# Patient Record
Sex: Male | Born: 1955 | Race: White | Hispanic: No | Marital: Married | Smoking: Former smoker
Health system: Southern US, Community
[De-identification: ages and names within clinical notes are randomized; demographics above are authoritative.]

## PROBLEM LIST (undated history)

## (undated) DIAGNOSIS — Z9981 Dependence on supplemental oxygen: Secondary | ICD-10-CM

## (undated) DIAGNOSIS — J449 Chronic obstructive pulmonary disease, unspecified: Secondary | ICD-10-CM

## (undated) DIAGNOSIS — I420 Dilated cardiomyopathy: Secondary | ICD-10-CM

## (undated) DIAGNOSIS — E785 Hyperlipidemia, unspecified: Secondary | ICD-10-CM

## (undated) DIAGNOSIS — Z972 Presence of dental prosthetic device (complete) (partial): Secondary | ICD-10-CM

## (undated) DIAGNOSIS — I509 Heart failure, unspecified: Secondary | ICD-10-CM

## (undated) DIAGNOSIS — I25118 Atherosclerotic heart disease of native coronary artery with other forms of angina pectoris: Secondary | ICD-10-CM

## (undated) DIAGNOSIS — I4891 Unspecified atrial fibrillation: Secondary | ICD-10-CM

## (undated) DIAGNOSIS — I2699 Other pulmonary embolism without acute cor pulmonale: Secondary | ICD-10-CM

## (undated) DIAGNOSIS — E039 Hypothyroidism, unspecified: Secondary | ICD-10-CM

## (undated) DIAGNOSIS — C329 Malignant neoplasm of larynx, unspecified: Secondary | ICD-10-CM

## (undated) DIAGNOSIS — I10 Essential (primary) hypertension: Secondary | ICD-10-CM

## (undated) DIAGNOSIS — J9611 Chronic respiratory failure with hypoxia: Secondary | ICD-10-CM

## (undated) HISTORY — DX: Malignant neoplasm of larynx, unspecified: C32.9

## (undated) HISTORY — PX: APPENDECTOMY: SHX54

## (undated) HISTORY — PX: ANAL FISSURE REPAIR: SHX2312

---

## 2005-04-30 ENCOUNTER — Other Ambulatory Visit: Payer: Self-pay

## 2005-04-30 ENCOUNTER — Inpatient Hospital Stay: Payer: Self-pay | Admitting: Internal Medicine

## 2013-04-29 HISTORY — PX: BIOPSY PHARYNX: SUR141

## 2013-06-27 ENCOUNTER — Emergency Department: Payer: Self-pay | Admitting: Emergency Medicine

## 2013-06-27 LAB — COMPREHENSIVE METABOLIC PANEL
ALT: 25 U/L (ref 12–78)
AST: 40 U/L — AB (ref 15–37)
Albumin: 3.3 g/dL — ABNORMAL LOW (ref 3.4–5.0)
Alkaline Phosphatase: 68 U/L
Anion Gap: 8 (ref 7–16)
BILIRUBIN TOTAL: 0.3 mg/dL (ref 0.2–1.0)
BUN: 11 mg/dL (ref 7–18)
CHLORIDE: 106 mmol/L (ref 98–107)
CO2: 25 mmol/L (ref 21–32)
Calcium, Total: 8.3 mg/dL — ABNORMAL LOW (ref 8.5–10.1)
Creatinine: 0.72 mg/dL (ref 0.60–1.30)
GLUCOSE: 93 mg/dL (ref 65–99)
Osmolality: 277 (ref 275–301)
POTASSIUM: 3.8 mmol/L (ref 3.5–5.1)
Sodium: 139 mmol/L (ref 136–145)
TOTAL PROTEIN: 7 g/dL (ref 6.4–8.2)

## 2013-06-27 LAB — CBC
HCT: 47.3 % (ref 40.0–52.0)
HGB: 15.2 g/dL (ref 13.0–18.0)
MCH: 29 pg (ref 26.0–34.0)
MCHC: 32.2 g/dL (ref 32.0–36.0)
MCV: 90 fL (ref 80–100)
PLATELETS: 107 10*3/uL — AB (ref 150–440)
RBC: 5.26 10*6/uL (ref 4.40–5.90)
RDW: 13.9 % (ref 11.5–14.5)
WBC: 7.2 10*3/uL (ref 3.8–10.6)

## 2013-06-27 LAB — TROPONIN I

## 2013-06-27 LAB — PRO B NATRIURETIC PEPTIDE: B-Type Natriuretic Peptide: 218 pg/mL — ABNORMAL HIGH (ref 0–125)

## 2013-07-02 LAB — CULTURE, BLOOD (SINGLE)

## 2013-08-06 ENCOUNTER — Ambulatory Visit: Payer: Self-pay | Admitting: Unknown Physician Specialty

## 2013-08-13 ENCOUNTER — Ambulatory Visit: Payer: Self-pay | Admitting: Oncology

## 2013-08-16 LAB — COMPREHENSIVE METABOLIC PANEL
ALK PHOS: 80 U/L
AST: 18 U/L (ref 15–37)
Albumin: 4 g/dL (ref 3.4–5.0)
Anion Gap: 6 — ABNORMAL LOW (ref 7–16)
BUN: 13 mg/dL (ref 7–18)
Bilirubin,Total: 0.4 mg/dL (ref 0.2–1.0)
CALCIUM: 9.8 mg/dL (ref 8.5–10.1)
CREATININE: 0.82 mg/dL (ref 0.60–1.30)
Chloride: 103 mmol/L (ref 98–107)
Co2: 30 mmol/L (ref 21–32)
EGFR (African American): >60
Glucose: 101 mg/dL — ABNORMAL HIGH (ref 65–99)
OSMOLALITY: 278 (ref 275–301)
POTASSIUM: 4.9 mmol/L (ref 3.5–5.1)
SGPT (ALT): 24 U/L (ref 12–78)
SODIUM: 139 mmol/L (ref 136–145)
TOTAL PROTEIN: 7.7 g/dL (ref 6.4–8.2)

## 2013-08-16 LAB — CBC CANCER CENTER
BASOS ABS: 0.1 x10 3/mm (ref 0.0–0.1)
Basophil %: 0.7 %
EOS PCT: 1.6 %
Eosinophil #: 0.1 x10 3/mm (ref 0.0–0.7)
HCT: 48.9 % (ref 40.0–52.0)
HGB: 15.9 g/dL (ref 13.0–18.0)
LYMPHS ABS: 2.6 x10 3/mm (ref 1.0–3.6)
Lymphocyte %: 27.4 %
MCH: 29.1 pg (ref 26.0–34.0)
MCHC: 32.5 g/dL (ref 32.0–36.0)
MCV: 90 fL (ref 80–100)
MONOS PCT: 10.1 %
Monocyte #: 1 x10 3/mm (ref 0.2–1.0)
NEUTROS PCT: 60.2 %
Neutrophil #: 5.7 x10 3/mm (ref 1.4–6.5)
PLATELETS: 196 x10 3/mm (ref 150–440)
RBC: 5.46 10*6/uL (ref 4.40–5.90)
RDW: 13.7 % (ref 11.5–14.5)
WBC: 9.4 x10 3/mm (ref 3.8–10.6)

## 2013-08-18 ENCOUNTER — Ambulatory Visit: Payer: Self-pay | Admitting: Oncology

## 2013-08-27 ENCOUNTER — Ambulatory Visit: Payer: Self-pay | Admitting: Oncology

## 2013-09-07 LAB — COMPREHENSIVE METABOLIC PANEL
ALK PHOS: 75 U/L
ALT: 31 U/L (ref 12–78)
Albumin: 3.6 g/dL (ref 3.4–5.0)
Anion Gap: 8 (ref 7–16)
BILIRUBIN TOTAL: 0.4 mg/dL (ref 0.2–1.0)
BUN: 13 mg/dL (ref 7–18)
CALCIUM: 8.5 mg/dL (ref 8.5–10.1)
CO2: 29 mmol/L (ref 21–32)
CREATININE: 0.74 mg/dL (ref 0.60–1.30)
Chloride: 106 mmol/L (ref 98–107)
EGFR (African American): >60
EGFR (Non-African Amer.): >60
Glucose: 99 mg/dL (ref 65–99)
Osmolality: 285 (ref 275–301)
Potassium: 4 mmol/L (ref 3.5–5.1)
SGOT(AST): 22 U/L (ref 15–37)
Sodium: 143 mmol/L (ref 136–145)
Total Protein: 7.2 g/dL (ref 6.4–8.2)

## 2013-09-07 LAB — CBC CANCER CENTER
BASOS ABS: 0 x10 3/mm (ref 0.0–0.1)
BASOS PCT: 0.6 %
EOS ABS: 0.2 x10 3/mm (ref 0.0–0.7)
EOS PCT: 2.1 %
HCT: 44.2 % (ref 40.0–52.0)
HGB: 14.7 g/dL (ref 13.0–18.0)
Lymphocyte #: 2.6 x10 3/mm (ref 1.0–3.6)
Lymphocyte %: 29.4 %
MCH: 29.3 pg (ref 26.0–34.0)
MCHC: 33.2 g/dL (ref 32.0–36.0)
MCV: 88 fL (ref 80–100)
MONO ABS: 0.9 x10 3/mm (ref 0.2–1.0)
Monocyte %: 10.2 %
NEUTROS ABS: 5 x10 3/mm (ref 1.4–6.5)
NEUTROS PCT: 57.7 %
Platelet: 194 x10 3/mm (ref 150–440)
RBC: 5.02 10*6/uL (ref 4.40–5.90)
RDW: 13.6 % (ref 11.5–14.5)
WBC: 8.7 x10 3/mm (ref 3.8–10.6)

## 2013-09-14 LAB — COMPREHENSIVE METABOLIC PANEL
ALT: 33 U/L (ref 12–78)
Albumin: 3.9 g/dL (ref 3.4–5.0)
Alkaline Phosphatase: 89 U/L
Anion Gap: 5 — ABNORMAL LOW (ref 7–16)
BUN: 12 mg/dL (ref 7–18)
Bilirubin,Total: 0.4 mg/dL (ref 0.2–1.0)
CREATININE: 0.73 mg/dL (ref 0.60–1.30)
Calcium, Total: 8.8 mg/dL (ref 8.5–10.1)
Chloride: 103 mmol/L (ref 98–107)
Co2: 32 mmol/L (ref 21–32)
EGFR (Non-African Amer.): >60
Glucose: 99 mg/dL (ref 65–99)
Osmolality: 279 (ref 275–301)
Potassium: 4.6 mmol/L (ref 3.5–5.1)
SGOT(AST): 18 U/L (ref 15–37)
SODIUM: 140 mmol/L (ref 136–145)
TOTAL PROTEIN: 7.7 g/dL (ref 6.4–8.2)

## 2013-09-14 LAB — CBC CANCER CENTER
Basophil #: 0.1 x10 3/mm (ref 0.0–0.1)
Basophil %: 0.6 %
Eosinophil #: 0.2 x10 3/mm (ref 0.0–0.7)
Eosinophil %: 2.1 %
HCT: 45.6 % (ref 40.0–52.0)
HGB: 15.4 g/dL (ref 13.0–18.0)
Lymphocyte #: 1.8 x10 3/mm (ref 1.0–3.6)
Lymphocyte %: 19 %
MCH: 29.1 pg (ref 26.0–34.0)
MCHC: 33.7 g/dL (ref 32.0–36.0)
MCV: 86 fL (ref 80–100)
Monocyte #: 1.1 x10 3/mm — ABNORMAL HIGH (ref 0.2–1.0)
Monocyte %: 11.6 %
NEUTROS PCT: 66.7 %
Neutrophil #: 6.3 x10 3/mm (ref 1.4–6.5)
Platelet: 193 x10 3/mm (ref 150–440)
RBC: 5.27 10*6/uL (ref 4.40–5.90)
RDW: 13.6 % (ref 11.5–14.5)
WBC: 9.5 x10 3/mm (ref 3.8–10.6)

## 2013-09-14 LAB — MAGNESIUM: Magnesium: 2 mg/dL

## 2013-09-21 LAB — CBC CANCER CENTER
BASOS PCT: 0.7 %
Basophil #: 0.1 x10 3/mm (ref 0.0–0.1)
EOS PCT: 2 %
Eosinophil #: 0.2 x10 3/mm (ref 0.0–0.7)
HCT: 46.5 % (ref 40.0–52.0)
HGB: 15.5 g/dL (ref 13.0–18.0)
LYMPHS ABS: 1.5 x10 3/mm (ref 1.0–3.6)
Lymphocyte %: 15.4 %
MCH: 29.7 pg (ref 26.0–34.0)
MCHC: 33.3 g/dL (ref 32.0–36.0)
MCV: 89 fL (ref 80–100)
Monocyte #: 1.1 x10 3/mm — ABNORMAL HIGH (ref 0.2–1.0)
Monocyte %: 11.3 %
NEUTROS ABS: 7.1 x10 3/mm — AB (ref 1.4–6.5)
Neutrophil %: 70.6 %
Platelet: 201 x10 3/mm (ref 150–440)
RBC: 5.22 10*6/uL (ref 4.40–5.90)
RDW: 13.9 % (ref 11.5–14.5)
WBC: 10 x10 3/mm (ref 3.8–10.6)

## 2013-09-27 ENCOUNTER — Ambulatory Visit: Payer: Self-pay | Admitting: Oncology

## 2013-09-28 LAB — COMPREHENSIVE METABOLIC PANEL
ALBUMIN: 3.9 g/dL (ref 3.4–5.0)
ALK PHOS: 87 U/L
ALT: 33 U/L (ref 12–78)
ANION GAP: 12 (ref 7–16)
BUN: 21 mg/dL — ABNORMAL HIGH (ref 7–18)
Bilirubin,Total: 0.5 mg/dL (ref 0.2–1.0)
CALCIUM: 9.6 mg/dL (ref 8.5–10.1)
CO2: 25 mmol/L (ref 21–32)
Chloride: 102 mmol/L (ref 98–107)
Creatinine: 0.89 mg/dL (ref 0.60–1.30)
EGFR (African American): >60
EGFR (Non-African Amer.): >60
GLUCOSE: 152 mg/dL — AB (ref 65–99)
Osmolality: 283 (ref 275–301)
Potassium: 4.3 mmol/L (ref 3.5–5.1)
SGOT(AST): 17 U/L (ref 15–37)
Sodium: 139 mmol/L (ref 136–145)
Total Protein: 8 g/dL (ref 6.4–8.2)

## 2013-09-28 LAB — CBC CANCER CENTER
BASOS ABS: 0 x10 3/mm (ref 0.0–0.1)
Basophil %: 0.2 %
EOS PCT: 0 %
Eosinophil #: 0 x10 3/mm (ref 0.0–0.7)
HCT: 47.5 % (ref 40.0–52.0)
HGB: 15.7 g/dL (ref 13.0–18.0)
Lymphocyte #: 0.6 x10 3/mm — ABNORMAL LOW (ref 1.0–3.6)
Lymphocyte %: 3.5 %
MCH: 29.1 pg (ref 26.0–34.0)
MCHC: 33.1 g/dL (ref 32.0–36.0)
MCV: 88 fL (ref 80–100)
MONOS PCT: 4.8 %
Monocyte #: 0.8 x10 3/mm (ref 0.2–1.0)
Neutrophil #: 15.7 x10 3/mm — ABNORMAL HIGH (ref 1.4–6.5)
Neutrophil %: 91.5 %
Platelet: 242 x10 3/mm (ref 150–440)
RBC: 5.41 10*6/uL (ref 4.40–5.90)
RDW: 13.5 % (ref 11.5–14.5)
WBC: 17.2 x10 3/mm — AB (ref 3.8–10.6)

## 2013-09-28 LAB — MAGNESIUM: MAGNESIUM: 1.8 mg/dL

## 2013-10-05 LAB — COMPREHENSIVE METABOLIC PANEL
AST: 10 U/L — AB (ref 15–37)
Albumin: 3.3 g/dL — ABNORMAL LOW (ref 3.4–5.0)
Alkaline Phosphatase: 68 U/L
Anion Gap: 6 — ABNORMAL LOW (ref 7–16)
BILIRUBIN TOTAL: 0.2 mg/dL (ref 0.2–1.0)
BUN: 14 mg/dL (ref 7–18)
CHLORIDE: 104 mmol/L (ref 98–107)
Calcium, Total: 8.5 mg/dL (ref 8.5–10.1)
Co2: 31 mmol/L (ref 21–32)
Creatinine: 0.76 mg/dL (ref 0.60–1.30)
EGFR (Non-African Amer.): >60
Glucose: 99 mg/dL (ref 65–99)
Osmolality: 282 (ref 275–301)
Potassium: 3.5 mmol/L (ref 3.5–5.1)
SGPT (ALT): 28 U/L (ref 12–78)
Sodium: 141 mmol/L (ref 136–145)
Total Protein: 6.6 g/dL (ref 6.4–8.2)

## 2013-10-05 LAB — CBC CANCER CENTER
BASOS ABS: 0.1 x10 3/mm (ref 0.0–0.1)
Basophil %: 0.4 %
EOS ABS: 0.3 x10 3/mm (ref 0.0–0.7)
EOS PCT: 1.9 %
HCT: 44.8 % (ref 40.0–52.0)
HGB: 14.6 g/dL (ref 13.0–18.0)
LYMPHS ABS: 1.5 x10 3/mm (ref 1.0–3.6)
Lymphocyte %: 11 %
MCH: 29.1 pg (ref 26.0–34.0)
MCHC: 32.7 g/dL (ref 32.0–36.0)
MCV: 89 fL (ref 80–100)
MONO ABS: 1.4 x10 3/mm — AB (ref 0.2–1.0)
Monocyte %: 9.7 %
NEUTROS ABS: 10.8 x10 3/mm — AB (ref 1.4–6.5)
NEUTROS PCT: 77 %
PLATELETS: 252 x10 3/mm (ref 150–440)
RBC: 5.03 10*6/uL (ref 4.40–5.90)
RDW: 13.9 % (ref 11.5–14.5)
WBC: 14.1 x10 3/mm — AB (ref 3.8–10.6)

## 2013-10-05 LAB — MAGNESIUM: Magnesium: 1.6 mg/dL — ABNORMAL LOW

## 2013-10-12 LAB — CBC CANCER CENTER
BASOS PCT: 0.5 %
Basophil #: 0.1 x10 3/mm (ref 0.0–0.1)
EOS ABS: 0.2 x10 3/mm (ref 0.0–0.7)
Eosinophil %: 1.1 %
HCT: 46.3 % (ref 40.0–52.0)
HGB: 15.3 g/dL (ref 13.0–18.0)
Lymphocyte #: 1.2 x10 3/mm (ref 1.0–3.6)
Lymphocyte %: 9 %
MCH: 29.7 pg (ref 26.0–34.0)
MCHC: 33.1 g/dL (ref 32.0–36.0)
MCV: 90 fL (ref 80–100)
Monocyte #: 1.2 x10 3/mm — ABNORMAL HIGH (ref 0.2–1.0)
Monocyte %: 9 %
NEUTROS PCT: 80.4 %
Neutrophil #: 10.8 x10 3/mm — ABNORMAL HIGH (ref 1.4–6.5)
PLATELETS: 207 x10 3/mm (ref 150–440)
RBC: 5.16 10*6/uL (ref 4.40–5.90)
RDW: 14.1 % (ref 11.5–14.5)
WBC: 13.5 x10 3/mm — ABNORMAL HIGH (ref 3.8–10.6)

## 2013-10-12 LAB — MAGNESIUM: MAGNESIUM: 1.6 mg/dL — AB

## 2013-10-19 LAB — COMPREHENSIVE METABOLIC PANEL
ALBUMIN: 3.5 g/dL (ref 3.4–5.0)
ALK PHOS: 82 U/L
AST: 32 U/L (ref 15–37)
Anion Gap: 7 (ref 7–16)
BUN: 14 mg/dL (ref 7–18)
Bilirubin,Total: 0.3 mg/dL (ref 0.2–1.0)
CALCIUM: 9.4 mg/dL (ref 8.5–10.1)
CREATININE: 0.92 mg/dL (ref 0.60–1.30)
Chloride: 104 mmol/L (ref 98–107)
Co2: 27 mmol/L (ref 21–32)
GLUCOSE: 150 mg/dL — AB (ref 65–99)
Osmolality: 279 (ref 275–301)
Potassium: 4 mmol/L (ref 3.5–5.1)
SGPT (ALT): 40 U/L (ref 12–78)
SODIUM: 138 mmol/L (ref 136–145)
TOTAL PROTEIN: 7.4 g/dL (ref 6.4–8.2)

## 2013-10-19 LAB — CBC CANCER CENTER
BASOS ABS: 0.1 x10 3/mm (ref 0.0–0.1)
Basophil %: 0.8 %
EOS ABS: 0.2 x10 3/mm (ref 0.0–0.7)
Eosinophil %: 3.1 %
HCT: 47.2 % (ref 40.0–52.0)
HGB: 15.5 g/dL (ref 13.0–18.0)
Lymphocyte #: 0.8 x10 3/mm — ABNORMAL LOW (ref 1.0–3.6)
Lymphocyte %: 11.6 %
MCH: 29.4 pg (ref 26.0–34.0)
MCHC: 32.8 g/dL (ref 32.0–36.0)
MCV: 90 fL (ref 80–100)
Monocyte #: 0.6 x10 3/mm (ref 0.2–1.0)
Monocyte %: 9.1 %
Neutrophil #: 5.2 x10 3/mm (ref 1.4–6.5)
Neutrophil %: 75.4 %
Platelet: 151 x10 3/mm (ref 150–440)
RBC: 5.27 10*6/uL (ref 4.40–5.90)
RDW: 14 % (ref 11.5–14.5)
WBC: 7 x10 3/mm (ref 3.8–10.6)

## 2013-10-19 LAB — MAGNESIUM: Magnesium: 1.9 mg/dL

## 2013-10-27 ENCOUNTER — Ambulatory Visit: Payer: Self-pay | Admitting: Oncology

## 2013-11-16 LAB — COMPREHENSIVE METABOLIC PANEL
ALT: 21 U/L (ref 12–78)
AST: 17 U/L (ref 15–37)
Albumin: 3.5 g/dL (ref 3.4–5.0)
Alkaline Phosphatase: 75 U/L
Anion Gap: 9 (ref 7–16)
BUN: 9 mg/dL (ref 7–18)
Bilirubin,Total: 0.3 mg/dL (ref 0.2–1.0)
CHLORIDE: 106 mmol/L (ref 98–107)
CO2: 26 mmol/L (ref 21–32)
CREATININE: 0.8 mg/dL (ref 0.60–1.30)
Calcium, Total: 8.7 mg/dL (ref 8.5–10.1)
EGFR (African American): >60
Glucose: 109 mg/dL — ABNORMAL HIGH (ref 65–99)
OSMOLALITY: 281 (ref 275–301)
POTASSIUM: 3.7 mmol/L (ref 3.5–5.1)
Sodium: 141 mmol/L (ref 136–145)
TOTAL PROTEIN: 7.1 g/dL (ref 6.4–8.2)

## 2013-11-16 LAB — MAGNESIUM: MAGNESIUM: 1.7 mg/dL — AB

## 2013-11-16 LAB — CBC CANCER CENTER
BASOS PCT: 0.7 %
Basophil #: 0 x10 3/mm (ref 0.0–0.1)
EOS PCT: 2.4 %
Eosinophil #: 0.1 x10 3/mm (ref 0.0–0.7)
HCT: 43.1 % (ref 40.0–52.0)
HGB: 14.1 g/dL (ref 13.0–18.0)
Lymphocyte #: 0.9 x10 3/mm — ABNORMAL LOW (ref 1.0–3.6)
Lymphocyte %: 14.1 %
MCH: 28.8 pg (ref 26.0–34.0)
MCHC: 32.6 g/dL (ref 32.0–36.0)
MCV: 88 fL (ref 80–100)
MONO ABS: 0.7 x10 3/mm (ref 0.2–1.0)
Monocyte %: 10.7 %
NEUTROS ABS: 4.6 x10 3/mm (ref 1.4–6.5)
Neutrophil %: 72.1 %
PLATELETS: 191 x10 3/mm (ref 150–440)
RBC: 4.88 10*6/uL (ref 4.40–5.90)
RDW: 13.8 % (ref 11.5–14.5)
WBC: 6.3 x10 3/mm (ref 3.8–10.6)

## 2013-11-27 ENCOUNTER — Ambulatory Visit: Payer: Self-pay | Admitting: Oncology

## 2014-01-04 ENCOUNTER — Ambulatory Visit: Payer: Self-pay | Admitting: Oncology

## 2014-01-06 ENCOUNTER — Ambulatory Visit: Payer: Self-pay | Admitting: Oncology

## 2014-01-06 LAB — COMPREHENSIVE METABOLIC PANEL
Albumin: 3.5 g/dL (ref 3.4–5.0)
Alkaline Phosphatase: 71 U/L
Anion Gap: 7 (ref 7–16)
BUN: 11 mg/dL (ref 7–18)
Bilirubin,Total: 0.4 mg/dL (ref 0.2–1.0)
Calcium, Total: 8.7 mg/dL (ref 8.5–10.1)
Chloride: 103 mmol/L (ref 98–107)
Co2: 29 mmol/L (ref 21–32)
Creatinine: 0.82 mg/dL (ref 0.60–1.30)
EGFR (African American): >60
EGFR (Non-African Amer.): >60
Glucose: 98 mg/dL (ref 65–99)
Osmolality: 277 (ref 275–301)
Potassium: 4.2 mmol/L (ref 3.5–5.1)
SGOT(AST): 29 U/L (ref 15–37)
SGPT (ALT): 24 U/L
Sodium: 139 mmol/L (ref 136–145)
Total Protein: 7.1 g/dL (ref 6.4–8.2)

## 2014-01-06 LAB — CBC CANCER CENTER
Basophil #: 0.1 x10 3/mm (ref 0.0–0.1)
Basophil %: 0.9 %
Eosinophil #: 0.1 x10 3/mm (ref 0.0–0.7)
Eosinophil %: 2.2 %
HCT: 45.9 % (ref 40.0–52.0)
HGB: 15.1 g/dL (ref 13.0–18.0)
Lymphocyte #: 1.1 x10 3/mm (ref 1.0–3.6)
Lymphocyte %: 17.5 %
MCH: 28.7 pg (ref 26.0–34.0)
MCHC: 32.9 g/dL (ref 32.0–36.0)
MCV: 87 fL (ref 80–100)
Monocyte #: 0.7 x10 3/mm (ref 0.2–1.0)
Monocyte %: 11.9 %
Neutrophil #: 4.1 x10 3/mm (ref 1.4–6.5)
Neutrophil %: 67.5 %
Platelet: 197 x10 3/mm (ref 150–440)
RBC: 5.25 10*6/uL (ref 4.40–5.90)
RDW: 13.9 % (ref 11.5–14.5)
WBC: 6.1 x10 3/mm (ref 3.8–10.6)

## 2014-01-06 LAB — TSH: THYROID STIMULATING HORM: 1.26 u[IU]/mL

## 2014-01-06 LAB — T4, FREE: Free Thyroxine: 1.01 ng/dL (ref 0.76–1.46)

## 2014-01-27 ENCOUNTER — Ambulatory Visit: Payer: Self-pay | Admitting: Oncology

## 2014-04-07 ENCOUNTER — Ambulatory Visit: Payer: Self-pay | Admitting: Oncology

## 2014-04-07 LAB — COMPREHENSIVE METABOLIC PANEL
ALK PHOS: 69 U/L
ANION GAP: 8 (ref 7–16)
Albumin: 4 g/dL (ref 3.4–5.0)
BILIRUBIN TOTAL: 0.4 mg/dL (ref 0.2–1.0)
BUN: 17 mg/dL (ref 7–18)
Calcium, Total: 8.4 mg/dL — ABNORMAL LOW (ref 8.5–10.1)
Chloride: 102 mmol/L (ref 98–107)
Co2: 29 mmol/L (ref 21–32)
Creatinine: 1.02 mg/dL (ref 0.60–1.30)
EGFR (African American): >60
GLUCOSE: 105 mg/dL — AB (ref 65–99)
OSMOLALITY: 279 (ref 275–301)
POTASSIUM: 4.3 mmol/L (ref 3.5–5.1)
SGOT(AST): 37 U/L (ref 15–37)
SGPT (ALT): 42 U/L
SODIUM: 139 mmol/L (ref 136–145)
Total Protein: 7.4 g/dL (ref 6.4–8.2)

## 2014-04-07 LAB — CBC CANCER CENTER
BASOS ABS: 0 x10 3/mm (ref 0.0–0.1)
Basophil %: 0.8 %
Eosinophil #: 0.1 x10 3/mm (ref 0.0–0.7)
Eosinophil %: 1.8 %
HCT: 46.7 % (ref 40.0–52.0)
HGB: 15.4 g/dL (ref 13.0–18.0)
Lymphocyte #: 1 x10 3/mm (ref 1.0–3.6)
Lymphocyte %: 17.8 %
MCH: 29 pg (ref 26.0–34.0)
MCHC: 32.9 g/dL (ref 32.0–36.0)
MCV: 88 fL (ref 80–100)
MONO ABS: 0.5 x10 3/mm (ref 0.2–1.0)
Monocyte %: 8.4 %
Neutrophil #: 4.2 x10 3/mm (ref 1.4–6.5)
Neutrophil %: 71.2 %
Platelet: 196 x10 3/mm (ref 150–440)
RBC: 5.3 10*6/uL (ref 4.40–5.90)
RDW: 15.3 % — AB (ref 11.5–14.5)
WBC: 5.9 x10 3/mm (ref 3.8–10.6)

## 2014-04-07 LAB — T4, FREE: Free Thyroxine: 0.24 ng/dL — ABNORMAL LOW (ref 0.76–1.46)

## 2014-04-07 LAB — TSH

## 2014-04-29 ENCOUNTER — Ambulatory Visit: Payer: Self-pay | Admitting: Oncology

## 2014-05-06 ENCOUNTER — Emergency Department: Payer: Self-pay | Admitting: Emergency Medicine

## 2014-05-21 ENCOUNTER — Emergency Department: Payer: Self-pay | Admitting: Internal Medicine

## 2014-05-21 LAB — CBC
HCT: 48.3 % (ref 40.0–52.0)
HGB: 16.1 g/dL (ref 13.0–18.0)
MCH: 30.4 pg (ref 26.0–34.0)
MCHC: 33.3 g/dL (ref 32.0–36.0)
MCV: 91 fL (ref 80–100)
PLATELETS: 192 10*3/uL (ref 150–440)
RBC: 5.29 10*6/uL (ref 4.40–5.90)
RDW: 15 % — ABNORMAL HIGH (ref 11.5–14.5)
WBC: 9.9 10*3/uL (ref 3.8–10.6)

## 2014-05-21 LAB — BASIC METABOLIC PANEL
Anion Gap: 5 — ABNORMAL LOW (ref 7–16)
BUN: 18 mg/dL (ref 7–18)
CHLORIDE: 106 mmol/L (ref 98–107)
Calcium, Total: 8.9 mg/dL (ref 8.5–10.1)
Co2: 25 mmol/L (ref 21–32)
Creatinine: 0.97 mg/dL (ref 0.60–1.30)
EGFR (Non-African Amer.): >60
GLUCOSE: 109 mg/dL — AB (ref 65–99)
OSMOLALITY: 274 (ref 275–301)
Potassium: 4 mmol/L (ref 3.5–5.1)
Sodium: 136 mmol/L (ref 136–145)

## 2014-05-21 LAB — TROPONIN I

## 2014-05-23 LAB — TSH: THYROID STIMULATING HORM: 30.1 u[IU]/mL — AB

## 2014-05-30 ENCOUNTER — Ambulatory Visit: Payer: Self-pay | Admitting: Oncology

## 2014-07-13 ENCOUNTER — Ambulatory Visit
Admit: 2014-07-13 | Disposition: A | Discharge status: Home / Self Care | Payer: Self-pay | Attending: Oncology | Admitting: Oncology

## 2014-07-29 ENCOUNTER — Ambulatory Visit
Admit: 2014-07-29 | Disposition: A | Discharge status: Home / Self Care | Payer: Self-pay | Attending: Oncology | Admitting: Oncology

## 2014-08-20 NOTE — Consult Note (Signed)
Reason for Visit: This 59 year old Male patient presents to the clinic for initial evaluation of  cancer of the supraglottic larynx .   Referred by Dr. Tami Ribas.  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  Pathology Report pathology report reviewed   Imaging Report PET CT scan reviewed   Referral Report clinical notes reviewed   Planned Treatment Regimen concurrent Erbitux with IM RT radiation therapy   HPI   Benjamin Torres is a 59 year old male who presents with a several month history of increasing swelling mostly on the right side. He was seen by ENT and underwent microlaryngoscopy with endoscopic biopsy of a lesion of the epiglottic laryngeal surface. Noted on examination was an ulcerative lesion on the right side of epiglottis. 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 Patient not have a PET CT scan showing hypermetabolic soft tissue lesion in the right hypopharynx consistent with epiglottic lesion. There also was a high right paratracheal lymph node which was also hypermetabolic and suspicious and worrisome for metastatic involvement.patient seen by medical oncology and planned concurrent chemotherapy with Erbitux and radiation therapy has been discussed. Case was presented other week the tumor conference. He is seen today for radiation oncology opinion. He is still having some soreness in the right side of his throat. No weight loss no dysphagia.  Past Hx:    COPD:    Blood Clots: R lung per patient 10-15 years ago   Denies medical history:   Past, Family and Social History:  Past Medical History positive   Respiratory COPD   Past Medical History Comments history of thrombophlebitis   Family History noncontributory   Social History positive   Social History Comments the patient has a  40-pack-year smoking history and social EtOH use history has quit smoking.   Additional Past Medical and Surgical History accompanied by multifamily members today   Allergies:   No Known Allergies:   Review of Systems:  General negative   Performance Status (ECOG) 0   Skin negative   Breast negative   Ophthalmologic negative   ENMT see HPI   Respiratory and Thorax negative   Cardiovascular negative   Gastrointestinal negative   Genitourinary negative   Musculoskeletal negative   Neurological negative   Psychiatric negative   Hematology/Lymphatics negative   Endocrine negative   Allergic/Immunologic negative   Review of Systems   review of systems obtained from nurses notes  Physical Exam:  General/Skin/HEENT:  General normal   Skin normal   Eyes normal   Additional PE well-developed male in NAD. Teeth are in good state of repair no oral mucosal lesions are identified. Indirect mirror examination shows base of tongue vallecula within normal limits. Upper airway is clear. Neck is clear without evidence of sub-digastric cervical or supraclavicular adenopathy. Lungs are clear to A&P cardiac examination shows regular rate and rhythm.   Breasts/Resp/CV/GI/GU:  Respiratory and Thorax normal   Cardiovascular normal   Gastrointestinal normal   Genitourinary normal   MS/Neuro/Psych/Lymph:  Musculoskeletal normal   Neurological normal   Lymphatics normal   Other Results:  Radiology Results: LabUnknown:    22-Apr-15 17:09, PET/CT Scan Larynx Staging  PACS Image   Nuclear Med:  PET/CT Scan Larynx Staging   REASON FOR EXAM:    Staging Workup Larynx CA  COMMENTS:       PROCEDURE: PET - PET/CT STAGING LARYNX  - Aug 18 2013  5:09PM     CLINICAL DATA:  Initial treatment strategy for laryngeal carcinoma.Marland Kitchen    EXAM:  NUCLEAR MEDICINE PET SKULL BASE TO THIGH    TECHNIQUE:  12.8 mCi F-18 FDG was injected intravenously. Full-ring PET imaging  was  performed from the skull base to thigh after the radiotracer. CT  data was obtained and used for attenuation correction and anatomic  localization.  FASTING BLOOD GLUCOSE:  Value: 90 mg/dl    COMPARISON:  CT chest 06/27/2013 05/10/2005.    FINDINGS:  NECK    A 10 x 12 mm soft tissue nodule is seen in the right hypopharynx,  along the superior aspect of the right piriform sinus, with an SUV  max of 14.6 (CT and PET image 79). No hypermetabolic lymph nodes in  the neck.    CHEST    A hypermetabolic high right paratracheal lymph node measures 10 mm  with an SUV max of 5.2 (CT image 93 and PET image 95). No additional  hypermetabolic mediastinal, hilar or axillary lymph nodes. No  hypermetabolic pulmonary nodules.    ABDOMEN/PELVIS    No abnormal hypermetabolism in the liver, adrenal glands, spleen or  pancreas. No hypermetabolic lymph nodes.    CT images show the liver to be grossly unremarkable. Stones are seen  in the gallbladder. Adrenal glands and right kidney are grossly  unremarkable. 12 mm low-attenuation lesion in the left kidney is  difficult to definitively characterize without post-contrast  imaging. There may be a low-attenuation lesion in the upper pole  left kidney as well, less well visualized. Spleen, pancreas, stomach  and bowel are grossly unremarkable. Calcifications are seen in the  prostate. No free fluid.    SKELETON    No focal hypermetabolic activity to suggest skeletal metastasis.     IMPRESSION:  1. Hypermetabolic soft tissue lesion in the right hypopharynx,  consistent with the given history laryngeal carcinoma.  Hypermetabolic high right paratracheal lymph node is worrisome for  regional metastasis.  2. Cholelithiasis.    Electronically Signed    By: Lorin Picket M.D.    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 Plan:   at this time I have recommended going ahead with IM RT radiation therapy. Would treat his primary area of tumor involvement and positive hypermetabolic node up to 1638 cGy. Remainder of neck nodes down to the supraclavicular fossa this will be treated to 5400 cGy using IM RT does painting technique. Risks and benefits of treatment including increasing dysphasia or sore throat alteration of taste alteration of blood counts possible xerostomia and skin reaction all were discussed in detail with the patient and his family. They all seem to comprehend my treatment plan well. I set him up for CT simulation later this week. I would use IM RT treatment planning and delivery to spare his salivary glands as much as possible as well as a spinal cord oral cavity. We'll coordinate his Erbitux therapy with medical oncology.  I would like to take this opportunity for allowing me to participate in the care of your patient..  CC Referral:  cc: Dr. Anda Latina, Dr. Hewitt Blade. Walker   Electronic  Signatures: Armstead Peaks (MD)  (Signed 27-Apr-15 13:48)  Authored: HPI, Diagnosis, Past Hx, PFSH, Allergies, ROS, Physical Exam, Other Results, Relevent Results, Encounter Assessment and Plan, CC Referring Physician   Last Updated: 27-Apr-15 13:48 by Armstead Peaks (MD)

## 2014-10-17 ENCOUNTER — Other Ambulatory Visit: Payer: Self-pay

## 2014-10-17 ENCOUNTER — Ambulatory Visit: Payer: Self-pay | Admitting: Oncology

## 2014-10-26 ENCOUNTER — Telehealth: Payer: Self-pay | Admitting: *Deleted

## 2014-10-26 DIAGNOSIS — C329 Malignant neoplasm of larynx, unspecified: Secondary | ICD-10-CM

## 2014-10-26 DIAGNOSIS — Z923 Personal history of irradiation: Secondary | ICD-10-CM

## 2014-10-26 MED ORDER — LEVOTHYROXINE SODIUM 100 MCG PO TABS: 100.0000 ug | ORAL_TABLET | Freq: Every day | ORAL | Status: DC

## 2014-10-26 NOTE — Telephone Encounter (Signed)
Escribed

## 2014-10-28 ENCOUNTER — Other Ambulatory Visit: Payer: Self-pay | Admitting: *Deleted

## 2014-10-28 DIAGNOSIS — C329 Malignant neoplasm of larynx, unspecified: Secondary | ICD-10-CM

## 2014-11-02 ENCOUNTER — Inpatient Hospital Stay (HOSPITAL_BASED_OUTPATIENT_CLINIC_OR_DEPARTMENT_OTHER): Payer: 59 | Admitting: Oncology

## 2014-11-02 ENCOUNTER — Inpatient Hospital Stay: Payer: 59 | Attending: Oncology

## 2014-11-02 VITALS — BP 121/80 | HR 65 | Temp 97.0°F | Wt 182.5 lb

## 2014-11-02 DIAGNOSIS — Z86711 Personal history of pulmonary embolism: Secondary | ICD-10-CM

## 2014-11-02 DIAGNOSIS — Z923 Personal history of irradiation: Secondary | ICD-10-CM | POA: Insufficient documentation

## 2014-11-02 DIAGNOSIS — Z79899 Other long term (current) drug therapy: Secondary | ICD-10-CM | POA: Insufficient documentation

## 2014-11-02 DIAGNOSIS — Z8521 Personal history of malignant neoplasm of larynx: Secondary | ICD-10-CM

## 2014-11-02 DIAGNOSIS — Z9221 Personal history of antineoplastic chemotherapy: Secondary | ICD-10-CM | POA: Diagnosis not present

## 2014-11-02 DIAGNOSIS — Z87891 Personal history of nicotine dependence: Secondary | ICD-10-CM | POA: Diagnosis not present

## 2014-11-02 DIAGNOSIS — C329 Malignant neoplasm of larynx, unspecified: Secondary | ICD-10-CM

## 2014-11-02 DIAGNOSIS — E039 Hypothyroidism, unspecified: Secondary | ICD-10-CM | POA: Diagnosis not present

## 2014-11-02 DIAGNOSIS — J449 Chronic obstructive pulmonary disease, unspecified: Secondary | ICD-10-CM

## 2014-11-02 DIAGNOSIS — C760 Malignant neoplasm of head, face and neck: Secondary | ICD-10-CM

## 2014-11-02 LAB — CBC WITH DIFFERENTIAL/PLATELET
BASOS PCT: 1 %
Basophils Absolute: 0.1 10*3/uL (ref 0–0.1)
EOS ABS: 0.1 10*3/uL (ref 0–0.7)
Eosinophils Relative: 2 %
HEMATOCRIT: 45.5 % (ref 40.0–52.0)
Hemoglobin: 14.9 g/dL (ref 13.0–18.0)
Lymphocytes Relative: 21 %
Lymphs Abs: 1.4 10*3/uL (ref 1.0–3.6)
MCH: 28.5 pg (ref 26.0–34.0)
MCHC: 32.8 g/dL (ref 32.0–36.0)
MCV: 87.1 fL (ref 80.0–100.0)
MONO ABS: 0.7 10*3/uL (ref 0.2–1.0)
Monocytes Relative: 10 %
Neutro Abs: 4.4 10*3/uL (ref 1.4–6.5)
Neutrophils Relative %: 66 %
Platelets: 193 10*3/uL (ref 150–440)
RBC: 5.23 MIL/uL (ref 4.40–5.90)
RDW: 14.2 % (ref 11.5–14.5)
WBC: 6.7 10*3/uL (ref 3.8–10.6)

## 2014-11-02 LAB — COMPREHENSIVE METABOLIC PANEL
ALBUMIN: 4.5 g/dL (ref 3.5–5.0)
ALK PHOS: 55 U/L (ref 38–126)
ALT: 18 U/L (ref 17–63)
ANION GAP: 8 (ref 5–15)
AST: 21 U/L (ref 15–41)
BUN: 18 mg/dL (ref 6–20)
CO2: 26 mmol/L (ref 22–32)
Calcium: 8.9 mg/dL (ref 8.9–10.3)
Chloride: 103 mmol/L (ref 101–111)
Creatinine, Ser: 0.92 mg/dL (ref 0.61–1.24)
GFR calc Af Amer: >60 mL/min (ref >60)
GFR calc non Af Amer: >60 mL/min (ref >60)
Glucose, Bld: 93 mg/dL (ref 65–99)
Potassium: 4.2 mmol/L (ref 3.5–5.1)
Sodium: 137 mmol/L (ref 135–145)
Total Bilirubin: 0.6 mg/dL (ref 0.3–1.2)
Total Protein: 7.4 g/dL (ref 6.5–8.1)

## 2014-11-02 LAB — TSH: TSH: 7.239 u[IU]/mL — AB (ref 0.350–4.500)

## 2014-11-02 NOTE — Progress Notes (Signed)
Patient does not have living will.  Former smoker. 

## 2014-11-03 LAB — T4: T4, Total: 9 ug/dL (ref 4.5–12.0)

## 2014-11-06 ENCOUNTER — Encounter: Payer: Self-pay | Admitting: Oncology

## 2014-11-06 DIAGNOSIS — C329 Malignant neoplasm of larynx, unspecified: Secondary | ICD-10-CM | POA: Insufficient documentation

## 2014-11-06 HISTORY — DX: Malignant neoplasm of larynx, unspecified: C32.9

## 2014-11-06 NOTE — Progress Notes (Signed)
[place] @ [facility] Telephone:(336) [phone]  Fax:(336) [place]: [birth year]  MR#: [id number]  ZJQ#:[id number]  No care team member to display  CHIEF COMPLAINT:  Chief Complaint  Patient presents with  . Follow-up    Oncology History   1.carcinoma of larynx involving epiglottis  Biopsy is positive for squamous cell carcinoma T1, N1, M0 (PET scan shows a small parapharyngeal lymph node make hypermetabolism possibility of metastatic  could not be ruled out) 2.started on radiation therapy and cetuximab (Sep 07, 2013) 3.patient has finished radiation and cetuximab therapy in July of 2015     Cancer of larynx    No flowsheet data found.  INTERVAL HISTORY: Extremely-year-old gentleman with carcinoma of larynx came today further follow-up.  Since last evaluation patient did not have any major complaint.  No soreness in the mouth.  No difficulty swallowing.  Patient has stopped smoking.  REVIEW OF SYSTEMS:   GENERAL:  Feels good.  Active.  No fevers, sweats or weight loss. PERFORMANCE STATUS (ECOG): 0 HEENT:  No visual changes, runny nose, sore throat, mouth sores or tenderness. Lungs: No shortness of breath or cough.  No hemoptysis. Cardiac:  No chest pain, palpitations, orthopnea, or PND. GI:  No nausea, vomiting, diarrhea, constipation, melena or hematochezia. GU:  No urgency, frequency, dysuria, or hematuria. Musculoskeletal:  No back pain.  No joint pain.  No muscle tenderness. Extremities:  No pain or swelling. Skin:  No rashes or skin changes. Neuro:  No headache, numbness or weakness, balance or coordination issues. Endocrine:  No diabetes, thyroid issues, hot flashes or night sweats. Psych:  No mood changes, depression or anxiety. Pain:  No focal pain. Review of systems:  All other systems reviewed and found to be negative. As per HPI. Otherwise, a complete review of systems is negatve.  PAST MEDICAL HISTORY: Past Medical History  Diagnosis  Date  . Cancer of larynx 11/06/2014    Allergies:  No Known Allergies:   Significant History/PMH:   COPD:    Blood Clots: R lung per patient 10-15 years ago   Denies medical history:   Preventive Screening:  Has patient had any of the following test? Prostate Exam   Last Prostate Exam: 15 years ago   Smoking History: Smoking History 1 Packs per day and 40 year use, quit 76months ago  [place]: Family History: noncontributory  Social History: positive alcohol, positive tobacco  Smoker: Smoking cessation counseling performed  Smoker- How Many Years: 42 YEARS  Additional Past Medical and Surgical History: history of pulmonary embolism 10 years ago  staph infection of the finger   ADVANCED DIRECTIVES:  Patient does not have any living will or healthcare power of attorney.  Information was given .  Available resources had been discussed.  We will follow-up on subsequent appointments regarding this issue HEALTH MAINTENANCE: History  Substance Use Topics  . Smoking status: Former Research scientist (life sciences)  . Smokeless tobacco: Not on file  . Alcohol Use: Not on file      No Known Allergies  Current Outpatient Prescriptions  Medication Sig Dispense Refill  . levothyroxine (SYNTHROID) 100 MCG tablet Take 1 tablet (100 mcg total) by mouth daily before breakfast. 30 tablet 0  . lisinopril (PRINIVIL,ZESTRIL) 10 MG tablet Take 10 mg by mouth daily.  5  . omeprazole (PRILOSEC) 20 MG capsule Take 20 mg by mouth daily.     No current facility-administered medications for this visit.    OBJECTIVE:  Filed Vitals:  11/02/14 1547  BP: 121/80  Pulse: 65  Temp: 97 F (36.1 C)     There is no height on file to calculate BMI.    ECOG FS:0 - Asymptomatic  PHYSICAL EXAM: GENERAL:  Well developed, well nourished, sitting comfortably in the exam room in no acute distress. MENTAL STATUS:  Alert and oriented to person, place and time. HEAD: Normocephalic, atraumatic, face symmetric, no Cushingoid  features. EYES.  Pupils equal round and reactive to light and accomodation.  No conjunctivitis or scleral icterus. ENT:  Oropharynx clear without lesion.  Tongue normal. Mucous membranes moist.  RESPIRATORY:  Clear to auscultation without rales, wheezes or rhonchi. CARDIOVASCULAR:  Regular rate and rhythm without murmur, rub or gallop.  ABDOMEN:  Soft, non-tender, with active bowel sounds, and no hepatosplenomegaly.  No masses. BACK:  No CVA tenderness.  No tenderness on percussion of the back or rib cage. SKIN:  No rashes, ulcers or lesions. EXTREMITIES: No edema, no skin discoloration or tenderness.  No palpable cords. LYMPH NODES: No palpable cervical, supraclavicular, axillary or inguinal adenopathy  NEUROLOGICAL: Unremarkable. PSYCH:  Appropriate.   LAB RESULTS:  Appointment on 11/02/2014  Component Date Value Ref Range Status  . WBC 11/02/2014 6.7  3.8 - 10.6 K/uL Final  . RBC 11/02/2014 5.23  4.40 - 5.90 MIL/uL Final  . Hemoglobin 11/02/2014 14.9  13.0 - 18.0 g/dL Final  . HCT 11/02/2014 45.5  40.0 - 52.0 % Final  . MCV 11/02/2014 87.1  80.0 - 100.0 fL Final  . MCH 11/02/2014 28.5  26.0 - 34.0 pg Final  . MCHC 11/02/2014 32.8  32.0 - 36.0 g/dL Final  . RDW 11/02/2014 14.2  11.5 - 14.5 % Final  . Platelets 11/02/2014 193  150 - 440 K/uL Final  . Neutrophils Relative % 11/02/2014 66   Final  . Neutro Abs 11/02/2014 4.4  1.4 - 6.5 K/uL Final  . Lymphocytes Relative 11/02/2014 21   Final  . Lymphs Abs 11/02/2014 1.4  1.0 - 3.6 K/uL Final  . Monocytes Relative 11/02/2014 10   Final  . Monocytes Absolute 11/02/2014 0.7  0.2 - 1.0 K/uL Final  . Eosinophils Relative 11/02/2014 2   Final  . Eosinophils Absolute 11/02/2014 0.1  0 - 0.7 K/uL Final  . Basophils Relative 11/02/2014 1   Final  . Basophils Absolute 11/02/2014 0.1  0 - 0.1 K/uL Final  . Sodium 11/02/2014 137  135 - 145 mmol/L Final  . Potassium 11/02/2014 4.2  3.5 - 5.1 mmol/L Final  . Chloride 11/02/2014 103  101 -  111 mmol/L Final  . CO2 11/02/2014 26  22 - 32 mmol/L Final  . Glucose, Bld 11/02/2014 93  65 - 99 mg/dL Final  . BUN 11/02/2014 18  6 - 20 mg/dL Final  . Creatinine, Ser 11/02/2014 0.92  0.61 - 1.24 mg/dL Final  . Calcium 11/02/2014 8.9  8.9 - 10.3 mg/dL Final  . Total Protein 11/02/2014 7.4  6.5 - 8.1 g/dL Final  . Albumin 11/02/2014 4.5  3.5 - 5.0 g/dL Final  . AST 11/02/2014 21  15 - 41 U/L Final  . ALT 11/02/2014 18  17 - 63 U/L Final  . Alkaline Phosphatase 11/02/2014 55  38 - 126 U/L Final  . Total Bilirubin 11/02/2014 0.6  0.3 - 1.2 mg/dL Final  . GFR calc non Af Amer 11/02/2014 >60  >60 mL/min Final  . GFR calc Af Amer 11/02/2014 >60  >60 mL/min Final   Comment: (NOTE) The  eGFR has been calculated using the CKD EPI equation. This calculation has not been validated in all clinical situations. eGFR's persistently <60 mL/min signify possible Chronic Kidney Disease.   . Anion gap 11/02/2014 8  5 - 15 Final  . T4, Total 11/02/2014 9.0  4.5 - 12.0 ug/dL Final   Comment: (NOTE) Performed At: [facility] [place], [place] [id number] Lindon Romp MD WU:[id number]   . TSH 11/02/2014 7.239* 0.350 - 4.500 uIU/mL Final       ASSESSMENT: Cancer of larynx at present time there is no evidence of recurrent disease Hypothyroidism TSH is slightly elevated patient is on 100 g of Synthroid will continue same dose  MEDICAL DECISION MAKING:  All lab data has been reviewed.  Repeat PET scan in 6 month  Patient expressed understanding and was in agreement with this plan. He also understands that He can call clinic at any time with any questions, concerns, or complaints.    Cancer of larynx   Staging form: Larynx - Supraglottis, AJCC 7th Edition     Clinical: No stage assigned - Marni Griffon, MD   11/06/2014 8:28 AM

## 2014-11-07 ENCOUNTER — Telehealth: Payer: Self-pay | Admitting: *Deleted

## 2014-11-07 DIAGNOSIS — C159 Malignant neoplasm of esophagus, unspecified: Secondary | ICD-10-CM

## 2014-11-07 DIAGNOSIS — C329 Malignant neoplasm of larynx, unspecified: Secondary | ICD-10-CM

## 2014-11-07 MED ORDER — LEVOTHYROXINE SODIUM 112 MCG PO TABS: 112.0000 ug | ORAL_TABLET | Freq: Every day | ORAL | Status: DC

## 2014-11-07 NOTE — Telephone Encounter (Signed)
Elevated TSH, need to increase synthroid to 153mcg. New prescription called into pharmacy at [facility]. Needs to start taking and will recheck labs in 6-8 weeks. Schedulers will call with lab appt date and time.

## 2014-11-07 NOTE — Telephone Encounter (Signed)
Called patient and left message that TSH is elevated. MD wants him to increase his synthroid to 112 mcg.  A new prescription has been called to [facility].  MD would like to recheck levels in 6-8 weeks.  A scheduler will be in touch with patient regarding that appt.

## 2014-12-26 ENCOUNTER — Inpatient Hospital Stay: Payer: 59 | Attending: Oncology

## 2015-03-27 ENCOUNTER — Telehealth: Payer: Self-pay | Admitting: *Deleted

## 2015-03-27 DIAGNOSIS — C760 Malignant neoplasm of head, face and neck: Secondary | ICD-10-CM

## 2015-03-27 MED ORDER — LISINOPRIL 10 MG PO TABS: 10.0000 mg | ORAL_TABLET | Freq: Every day | ORAL | Status: DC

## 2015-03-27 NOTE — Telephone Encounter (Signed)
Escribed

## 2015-05-04 ENCOUNTER — Ambulatory Visit: Payer: 59 | Attending: Oncology

## 2015-05-11 ENCOUNTER — Inpatient Hospital Stay: Payer: BLUE CROSS/BLUE SHIELD

## 2015-05-11 ENCOUNTER — Inpatient Hospital Stay: Payer: BLUE CROSS/BLUE SHIELD | Admitting: Oncology

## 2015-05-30 ENCOUNTER — Inpatient Hospital Stay: Payer: BLUE CROSS/BLUE SHIELD | Attending: Oncology | Admitting: Oncology

## 2015-05-30 ENCOUNTER — Encounter: Payer: Self-pay | Admitting: Oncology

## 2015-05-30 ENCOUNTER — Inpatient Hospital Stay: Payer: BLUE CROSS/BLUE SHIELD

## 2015-05-30 VITALS — BP 139/90 | HR 76 | Temp 97.5°F | Resp 18 | Wt 184.0 lb

## 2015-05-30 DIAGNOSIS — Z923 Personal history of irradiation: Secondary | ICD-10-CM | POA: Diagnosis not present

## 2015-05-30 DIAGNOSIS — Z8521 Personal history of malignant neoplasm of larynx: Secondary | ICD-10-CM | POA: Diagnosis not present

## 2015-05-30 DIAGNOSIS — Z86711 Personal history of pulmonary embolism: Secondary | ICD-10-CM | POA: Insufficient documentation

## 2015-05-30 DIAGNOSIS — E039 Hypothyroidism, unspecified: Secondary | ICD-10-CM | POA: Insufficient documentation

## 2015-05-30 DIAGNOSIS — Z87891 Personal history of nicotine dependence: Secondary | ICD-10-CM | POA: Insufficient documentation

## 2015-05-30 DIAGNOSIS — Z9221 Personal history of antineoplastic chemotherapy: Secondary | ICD-10-CM | POA: Insufficient documentation

## 2015-05-30 DIAGNOSIS — J449 Chronic obstructive pulmonary disease, unspecified: Secondary | ICD-10-CM | POA: Insufficient documentation

## 2015-05-30 DIAGNOSIS — Z79899 Other long term (current) drug therapy: Secondary | ICD-10-CM | POA: Diagnosis not present

## 2015-05-30 DIAGNOSIS — C329 Malignant neoplasm of larynx, unspecified: Secondary | ICD-10-CM

## 2015-05-30 DIAGNOSIS — C760 Malignant neoplasm of head, face and neck: Secondary | ICD-10-CM

## 2015-05-30 LAB — COMPREHENSIVE METABOLIC PANEL
ALBUMIN: 4.5 g/dL (ref 3.5–5.0)
ALK PHOS: 52 U/L (ref 38–126)
ALT: 17 U/L (ref 17–63)
AST: 22 U/L (ref 15–41)
Anion gap: 5 (ref 5–15)
BUN: 12 mg/dL (ref 6–20)
CO2: 26 mmol/L (ref 22–32)
Calcium: 9.1 mg/dL (ref 8.9–10.3)
Chloride: 103 mmol/L (ref 101–111)
Creatinine, Ser: 0.82 mg/dL (ref 0.61–1.24)
GFR calc Af Amer: >60 mL/min (ref >60)
GFR calc non Af Amer: >60 mL/min (ref >60)
Glucose, Bld: 96 mg/dL (ref 65–99)
POTASSIUM: 4.3 mmol/L (ref 3.5–5.1)
Sodium: 134 mmol/L — ABNORMAL LOW (ref 135–145)
Total Bilirubin: 0.6 mg/dL (ref 0.3–1.2)
Total Protein: 7.6 g/dL (ref 6.5–8.1)

## 2015-05-30 LAB — CBC WITH DIFFERENTIAL/PLATELET
BASOS PCT: 1 %
Basophils Absolute: 0 10*3/uL (ref 0–0.1)
Eosinophils Absolute: 0.1 10*3/uL (ref 0–0.7)
Eosinophils Relative: 2 %
HEMATOCRIT: 44.3 % (ref 40.0–52.0)
HEMOGLOBIN: 14.8 g/dL (ref 13.0–18.0)
Lymphocytes Relative: 17 %
Lymphs Abs: 1.4 10*3/uL (ref 1.0–3.6)
MCH: 28.7 pg (ref 26.0–34.0)
MCHC: 33.3 g/dL (ref 32.0–36.0)
MCV: 86 fL (ref 80.0–100.0)
Monocytes Absolute: 0.8 10*3/uL (ref 0.2–1.0)
Monocytes Relative: 10 %
NEUTROS ABS: 5.8 10*3/uL (ref 1.4–6.5)
NEUTROS PCT: 70 %
Platelets: 222 10*3/uL (ref 150–440)
RBC: 5.15 MIL/uL (ref 4.40–5.90)
RDW: 13.9 % (ref 11.5–14.5)
WBC: 8.1 10*3/uL (ref 3.8–10.6)

## 2015-05-30 LAB — TSH: TSH: 2.567 u[IU]/mL (ref 0.350–4.500)

## 2015-05-30 MED ORDER — AZITHROMYCIN 500 MG PO TABS: 500.0000 mg | ORAL_TABLET | Freq: Every day | ORAL | Status: DC

## 2015-05-30 NOTE — Progress Notes (Signed)
[place] @ [facility] Telephone:(336) [phone]  Fax:(336) [place]: 08-Jul-1955  MR#: [id number]  MCN#:[id number]  No care team member to display  CHIEF COMPLAINT:  Chief Complaint  Patient presents with  . Head and neck cancer    Oncology History   1.carcinoma of larynx involving epiglottis  Biopsy is positive for squamous cell carcinoma T1, N1, M0 (PET scan shows a small parapharyngeal lymph node make hypermetabolism possibility of metastatic  could not be ruled out) 2.started on radiation therapy and cetuximab (Sep 07, 2013) 3.patient has finished radiation and cetuximab therapy in July of 2015     Cancer of larynx [facility])    No flowsheet data found.  INTERVAL HISTORY: Extremely-year-old gentleman with carcinoma of larynx came today further follow-up.  Since last evaluation patient did not have any major complaint.  No soreness in the mouth.  No difficulty swallowing.  Patient has stopped smoking. Patient  complaints of sore throat Did not have any HEENT checkup in last few months  REVIEW OF SYSTEMS:   GENERAL:  Feels good.  Active.  No fevers, sweats or weight loss. PERFORMANCE STATUS (ECOG): 0 HEENT:  No visual changes, runny nose, sore throat, mouth sores or tenderness. Lungs: No shortness of breath or cough.  No hemoptysis. Cardiac:  No chest pain, palpitations, orthopnea, or PND. GI:  No nausea, vomiting, diarrhea, constipation, melena or hematochezia. GU:  No urgency, frequency, dysuria, or hematuria. Musculoskeletal:  No back pain.  No joint pain.  No muscle tenderness. Extremities:  No pain or swelling. Skin:  No rashes or skin changes. Neuro:  No headache, numbness or weakness, balance or coordination issues. Endocrine:  No diabetes, thyroid issues, hot flashes or night sweats. Psych:  No mood changes, depression or anxiety. Pain:  No focal pain. Review of systems:  All other systems reviewed and found to be negative. As per HPI. Otherwise,  a complete review of systems is negatve.  PAST MEDICAL HISTORY: Past Medical History  Diagnosis Date  . Cancer of larynx ([place]) 11/06/2014    Allergies:  No Known Allergies:   Significant History/PMH:   COPD:    Blood Clots: R lung per patient 10-15 years ago   Denies medical history:   Preventive Screening:  Has patient had any of the following test? Prostate Exam   Last Prostate Exam: 15 years ago   Smoking History: Smoking History 1 Packs per day and 40 year use, quit 41month ago  [place]: noncontributory  Social History: positive alcohol, positive tobacco  Smoker: Smoking cessation counseling performed  Smoker- How Many Years: 436YEARS  Additional Past Medical and Surgical History: history of pulmonary embolism 10 years ago  staph infection of the finger   ADVANCED DIRECTIVES:  Patient does not have any living will or healthcare power of attorney.  Information was given .  Available resources had been discussed.  We will follow-up on subsequent appointments regarding this issue HEALTH MAINTENANCE: Social History  Substance Use Topics  . Smoking status: Former SResearch scientist (life sciences) . Smokeless tobacco: None  . Alcohol Use: None      No Known Allergies  Current Outpatient Prescriptions  Medication Sig Dispense Refill  . levothyroxine (SYNTHROID) 112 MCG tablet Take 1 tablet (112 mcg total) by mouth daily before breakfast. 30 tablet 6  . lisinopril (PRINIVIL,ZESTRIL) 10 MG tablet Take 1 tablet (10 mg total) by mouth daily. 30 tablet 2  . omeprazole (PRILOSEC) 20 MG capsule Take 20 mg  by mouth daily.     No current facility-administered medications for this visit.    OBJECTIVE:  Filed Vitals:   05/30/15 1413  BP: 139/90  Pulse: 76  Temp: 97.5 F (36.4 C)  Resp: 18     There is no height on file to calculate BMI.    ECOG FS:0 - Asymptomatic  PHYSICAL EXAM: GENERAL:  Well developed, well nourished, sitting comfortably in the exam room in no acute  distress. MENTAL STATUS:  Alert and oriented to person, place and time. HEAD: Normocephalic, atraumatic, face symmetric, no Cushingoid features. EYES.  Pupils equal round and reactive to light and accomodation.  No conjunctivitis or scleral icterus. ENT:  Oropharynx clear without lesion.  Tongue normal. Mucous membranes moist.  RESPIRATORY:  Clear to auscultation without rales, wheezes or rhonchi. CARDIOVASCULAR:  Regular rate and rhythm without murmur, rub or gallop.  ABDOMEN:  Soft, non-tender, with active bowel sounds, and no hepatosplenomegaly.  No masses. BACK:  No CVA tenderness.  No tenderness on percussion of the back or rib cage. SKIN:  No rashes, ulcers or lesions. EXTREMITIES: No edema, no skin discoloration or tenderness.  No palpable cords. LYMPH NODES: No palpable cervical, supraclavicular, axillary or inguinal adenopathy  NEUROLOGICAL: Unremarkable. PSYCH:  Appropriate.   LAB RESULTS:  Appointment on 05/30/2015  Component Date Value Ref Range Status  . WBC 05/30/2015 8.1  3.8 - 10.6 K/uL Final  . RBC 05/30/2015 5.15  4.40 - 5.90 MIL/uL Final  . Hemoglobin 05/30/2015 14.8  13.0 - 18.0 g/dL Final  . HCT 05/30/2015 44.3  40.0 - 52.0 % Final  . MCV 05/30/2015 86.0  80.0 - 100.0 fL Final  . MCH 05/30/2015 28.7  26.0 - 34.0 pg Final  . MCHC 05/30/2015 33.3  32.0 - 36.0 g/dL Final  . RDW 05/30/2015 13.9  11.5 - 14.5 % Final  . Platelets 05/30/2015 222  150 - 440 K/uL Final  . Neutrophils Relative % 05/30/2015 70   Final  . Neutro Abs 05/30/2015 5.8  1.4 - 6.5 K/uL Final  . Lymphocytes Relative 05/30/2015 17   Final  . Lymphs Abs 05/30/2015 1.4  1.0 - 3.6 K/uL Final  . Monocytes Relative 05/30/2015 10   Final  . Monocytes Absolute 05/30/2015 0.8  0.2 - 1.0 K/uL Final  . Eosinophils Relative 05/30/2015 2   Final  . Eosinophils Absolute 05/30/2015 0.1  0 - 0.7 K/uL Final  . Basophils Relative 05/30/2015 1   Final  . Basophils Absolute 05/30/2015 0.0  0 - 0.1 K/uL Final  .  Sodium 05/30/2015 134* 135 - 145 mmol/L Final  . Potassium 05/30/2015 4.3  3.5 - 5.1 mmol/L Final  . Chloride 05/30/2015 103  101 - 111 mmol/L Final  . CO2 05/30/2015 26  22 - 32 mmol/L Final  . Glucose, Bld 05/30/2015 96  65 - 99 mg/dL Final  . BUN 05/30/2015 12  6 - 20 mg/dL Final  . Creatinine, Ser 05/30/2015 0.82  0.61 - 1.24 mg/dL Final  . Calcium 05/30/2015 9.1  8.9 - 10.3 mg/dL Final  . Total Protein 05/30/2015 7.6  6.5 - 8.1 g/dL Final  . Albumin 05/30/2015 4.5  3.5 - 5.0 g/dL Final  . AST 05/30/2015 22  15 - 41 U/L Final  . ALT 05/30/2015 17  17 - 63 U/L Final  . Alkaline Phosphatase 05/30/2015 52  38 - 126 U/L Final  . Total Bilirubin 05/30/2015 0.6  0.3 - 1.2 mg/dL Final  . GFR calc non  Af Amer 05/30/2015 >60  >60 mL/min Final  . GFR calc Af Amer 05/30/2015 >60  >60 mL/min Final   Comment: (NOTE) The eGFR has been calculated using the CKD EPI equation. This calculation has not been validated in all clinical situations. eGFR's persistently <60 mL/min signify possible Chronic Kidney Disease.   . Anion gap 05/30/2015 5  5 - 15 Final       ASSESSMENT: Cancer of larynx at present time there is no evidence of recurrent disease Hypothyroidism TSH is slightly elevated patient is on 100 g of Synthroid will continue same dose Neck T4 and TSH and adjusted dose if needed a patient was given now to throw max for 3 days Appointment with ENT physician has been made A repeat PET scan has been scheduled if this is normal patient does not need any further PET scanning and can be clinically followed    MEDICAL DECISION MAKING: Lab data has been reviewed  Patient expressed understanding and was in agreement with this plan. He also understands that He can call clinic at any time with any questions, concerns, or complaints.    Cancer of larynx   Staging form: Larynx - Supraglottis, AJCC 7th Edition     Clinical: No stage assigned - Marni Griffon, MD   05/30/2015 3:04  PM

## 2015-05-31 LAB — T4: T4 TOTAL: 10.2 ug/dL (ref 4.5–12.0)

## 2015-06-20 ENCOUNTER — Ambulatory Visit
Admission: RE | Admit: 2015-06-20 | Discharge: 2015-06-20 | Disposition: A | Discharge status: Home / Self Care | Payer: BLUE CROSS/BLUE SHIELD | Source: Ambulatory Visit | Attending: Oncology | Admitting: Oncology

## 2015-06-20 DIAGNOSIS — C329 Malignant neoplasm of larynx, unspecified: Secondary | ICD-10-CM

## 2015-06-20 LAB — GLUCOSE, CAPILLARY: GLUCOSE-CAPILLARY: 111 mg/dL — AB (ref 65–99)

## 2015-06-20 MED ORDER — FLUDEOXYGLUCOSE F - 18 (FDG) INJECTION
12.3700 | Freq: Once | INTRAVENOUS | Status: AC | PRN
  Administered 2015-06-20: 12.37 via INTRAVENOUS

## 2015-07-30 ENCOUNTER — Other Ambulatory Visit: Payer: Self-pay | Admitting: Oncology

## 2015-08-14 ENCOUNTER — Ambulatory Visit: Payer: BLUE CROSS/BLUE SHIELD | Attending: Unknown Physician Specialty | Admitting: Speech Pathology

## 2015-08-14 DIAGNOSIS — R490 Dysphonia: Secondary | ICD-10-CM | POA: Insufficient documentation

## 2015-08-15 ENCOUNTER — Encounter: Payer: Self-pay | Admitting: Speech Pathology

## 2015-08-15 NOTE — Therapy (Signed)
[place] MAIN [facility] SERVICES [address] [place], [place], [address] Phone: [phone]   Fax:  [phone]  Speech Language Pathology Evaluation  Patient Details  Name: Benjamin Torres MRN: [id number] Date of Birth: [birth year] Referring Provider: Beverly Gust, MD  Encounter Date: 08/14/2015      End of Session - 08/15/15 1245    Visit Number 1   Number of Visits 17   Date for SLP Re-Evaluation 10/06/15   SLP Start Time 0850   SLP Stop Time  0935   SLP Time Calculation (min) 45 min   Activity Tolerance Patient tolerated treatment well      Past Medical History  Diagnosis Date  . 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 No pertinent past surgical history.  There were no vitals filed for this visit.      Subjective Assessment - 08/15/15 1244    Subjective This 60 year-old male is presenting with severe dysphonia characterized by excessive laryngeal muscle tension, strained/strangled vocal quality, asthenia, and nearly aphonic vocal quality. His ENT physician, Dr. Beverly Torres, noted that he is using his false vocal folds for speech, but does have a normal cough.   Currently in Pain? No/denies            SLP Evaluation [facility] - 08/15/15 0001    SLP Visit Information   SLP Received On 08/14/15   Referring Provider Benjamin Gust, MD   Onset Date 06/16/2015   Medical Diagnosis Dysphonia   Subjective   Subjective This 60 year-old male is presenting with severe dysphonia characterized by excessive laryngeal muscle tension, strained/strangled vocal quality, asthenia, and nearly aphonic vocal quality. His ENT physician, Dr. Beverly Torres, noted that he is using his false vocal folds for speech, but does have a normal cough.   Patient/Family Stated Goal To be able to be heard   Prior Functional Status   Cognitive/Linguistic Baseline Within functional limits   Oral Motor/Sensory Function   Overall Oral  Motor/Sensory Function Appears within functional limits for tasks assessed   Motor Speech   Overall Motor Speech Appears within functional limits for tasks assessed   Phonation Aphonic;Low vocal intensity;Hoarse   Standardized Assessments   Standardized Assessments  Other Assessment  Perceptual Voice Evaluation           Perceptual Voice Evaluation Average time patient was able to sustain /s/: 10 Average time patient was able to sustain /z/: unable to phonate s/z ratio : unable to phonate on /z/              SLP Education - 08/15/15 1244    Education provided Yes   Education Details the role of the SLP in treatment of dysphonia   Person(s) Educated Patient   Methods Explanation   Comprehension Verbalized understanding            SLP Long Term Goals - 08/15/15 1246    SLP LONG TERM GOAL #1   Title The patient will demonstrate independent understanding of vocal hygiene concepts and neck, shoulder, lingual stretching exercises.   Status New   SLP LONG TERM GOAL #2   Title The patient will be independent for abdominal breathing and breath support exercises.   Status New   SLP LONG TERM GOAL #3   Title The patient will minimize vocal tension via Yawn-Sigh approach (or comparable technique) with min SLP cues with 80% accuracy.   Status New   SLP LONG TERM GOAL #4  Title The patient will maintain relaxed phonation / oral resonance for paragraph length recitation with 80% accuracy.   Status New   SLP LONG TERM GOAL #5   Title The patient will maximize voice quality and loudness using breath support for sustained vowel production, pitch glides, and hierarchical speech drill.   Status New          Plan - 08/15/15 1245    Clinical Impression Statement This 59 year-old male is presenting with severe dysphonia characterized by excessive laryngeal muscle tension, strained/strangled vocal quality, asthenia, and nearly aphonic vocal quality. His ENT physician, Dr.  Beverly Torres, noted that he is using his false vocal folds for speech, but does have a normal cough. [name] noted that his voice related concerns began about 2 months ago and have worsened. He does not associate his voice related concerns with anything in particular. 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 He completed radiation therapy in July of 2015. During today's evaluation, [name] demonstrated potential for positive outcomes in voice therapy through his stimulability for strengthening exercises such as tongue trills, and desire to improve his voice. The patient will benefit from restorative treatment of dysphonia to maximize independence and quality of life.    Speech Therapy Frequency 2x / week   Duration Other (comment)  8 weeks   Treatment/Interventions Other (comment)  voice therapy   Potential to Achieve Goals Fair   Potential Considerations Ability to learn/carryover information;Severity of impairments;Family/community support   SLP Home Exercise Plan to be determined   Consulted and Agree with Plan of Care Patient      Patient will benefit from skilled therapeutic intervention in order to improve the following deficits and impairments:   Dysphonia    Problem List Patient Active Problem List   Diagnosis Date Noted  . Cancer of larynx [facility]) 11/06/2014    Benjamin Torres 08/15/2015, 12:48 PM  [place] MAIN [facility] SERVICES [address] [place], [place], [address] Phone: [phone]   Fax:  [phone]  Name: Benjamin Torres MRN: [id number] Date of Birth: Oct 08, 1955

## 2015-08-16 ENCOUNTER — Ambulatory Visit: Payer: BLUE CROSS/BLUE SHIELD | Admitting: Speech Pathology

## 2015-08-16 ENCOUNTER — Encounter: Payer: Self-pay | Admitting: Speech Pathology

## 2015-08-16 DIAGNOSIS — R490 Dysphonia: Secondary | ICD-10-CM | POA: Diagnosis not present

## 2015-08-16 NOTE — Therapy (Signed)
[place] MAIN [facility] SERVICES [address] [place], [place], [address] Phone: [phone]   Fax:  [phone]  Speech Language Pathology Treatment  Patient Details  Name: Benjamin Torres MRN: [id number] Date of Birth: June 11, 1955 Referring Provider: Beverly Gust, MD  Encounter Date: 08/16/2015      End of Session - 08/16/15 1328    Visit Number 2   Number of Visits 17   Date for SLP Re-Evaluation 10/06/15   SLP Start Time 0900   SLP Stop Time  1000   SLP Time Calculation (min) 60 min   Activity Tolerance Patient tolerated treatment well      Past Medical History  Diagnosis Date  . 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 No pertinent past surgical history.  There were no vitals filed for this visit.      Subjective Assessment - 08/16/15 1327    Subjective Patient returns with reports that he has been doing his exercises we went over during the initial evaluation and that he was able to "get out a few words yesterday."   Currently in Pain? No/denies               ADULT SLP TREATMENT - 08/16/15 0001    General Information   Behavior/Cognition Alert;Cooperative;Pleasant mood   Treatment Provided   Treatment provided Cognitive-Linquistic   Pain Assessment   Pain Assessment No/denies pain   Cognitive-Linquistic Treatment   Treatment focused on Voice   Skilled Treatment Patient was 80% accurate for executing stretches to reduce tension in the head and neck that included the masseter muscles, base of tongue, and larynx. Patient was 85% accurate for tongue trills without phonation; unable to execute trill with voicing. Patient was 80% accurate for relaxed throat breathing with a semi-occluded vocal tract. Patient was 70% accurate for using abdominal breathing strategies with max cues from clinician.   Assessment / Recommendations / Plan   Plan Continue with current plan of care   Progression Toward Goals   Progression toward goals Progressing toward goals          SLP Education - 08/16/15 1328    Education provided Yes   Education Details abdominal breathing; stretches and massage to reduce laryngeal tension   Person(s) Educated Patient   Methods Explanation;Demonstration   Comprehension Verbalized understanding;Returned demonstration            SLP Long Term Goals - 08/15/15 1246    SLP LONG TERM GOAL #1   Title The patient will demonstrate independent understanding of vocal hygiene concepts and neck, shoulder, lingual stretching exercises.   Status New   SLP LONG TERM GOAL #2   Title The patient will be independent for abdominal breathing and breath support exercises.   Status New   SLP LONG TERM GOAL #3   Title The patient will minimize vocal tension via Yawn-Sigh approach (or comparable technique) with min SLP cues with 80% accuracy.   Status New   SLP LONG TERM GOAL #4   Title The patient will maintain relaxed phonation / oral resonance for paragraph length recitation with 80% accuracy.   Status New   SLP LONG TERM GOAL #5   Title The patient will maximize voice quality and loudness using breath support for sustained vowel production, pitch glides, and hierarchical speech drill.   Status New          Plan - 08/16/15 1328    Clinical Impression Statement Patient returns with reports  that he was able to "get a few words out yesterday." Patient continues to use excessive laryngeal muscle activation during speech. Today's session focused on reducing tension in the head and neck through stretch and massage (larynx, masseter muscles, and base of tongue) and abdominal breathing. Patient was stimulable for tongue trills without phonation and relaxed throat breathing through a straw. Continue dynamic assessment for easy onset phonation during next session.   Speech Therapy Frequency 2x / week   Duration Other (comment)  8 weeks   Treatment/Interventions Other (comment)  voice  therapy   Potential to Achieve Goals Fair   Potential Considerations Ability to learn/carryover information;Severity of impairments;Family/community support   SLP Home Exercise Plan abdominal breathing; stretches and massage to reduce laryngeal tension   Consulted and Agree with Plan of Care Patient      Patient will benefit from skilled therapeutic intervention in order to improve the following deficits and impairments:   Dysphonia    Problem List Patient Active Problem List   Diagnosis Date Noted  . 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 [place], [place], [address] Phone: [phone]   Fax:  [phone]   Name: Benjamin Torres MRN: [id number] Date of Birth: Apr 22, 1956

## 2015-08-22 ENCOUNTER — Ambulatory Visit: Payer: BLUE CROSS/BLUE SHIELD | Admitting: Speech Pathology

## 2015-08-22 ENCOUNTER — Encounter: Payer: Self-pay | Admitting: Speech Pathology

## 2015-08-22 DIAGNOSIS — R490 Dysphonia: Secondary | ICD-10-CM

## 2015-08-22 NOTE — Therapy (Signed)
[name] [facility] SERVICES [address] [place], [place], [address] Phone: [phone]   Fax:  [phone]  Speech Language Pathology Treatment  Patient Details  Name: Benjamin Torres MRN: [id number] Date of Birth: 1955/08/15 Referring Provider: Beverly Gust, MD  Encounter Date: 08/22/2015      End of Session - 08/22/15 1516    Visit Number 3   Number of Visits 17   Date for SLP Re-Evaluation 10/06/15   SLP Start Time 0900   SLP Stop Time  1000   SLP Time Calculation (min) 60 min   Activity Tolerance Patient tolerated treatment well      Past Medical History  Diagnosis Date  . 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 No pertinent past surgical history.  There were no vitals filed for this visit.      Subjective Assessment - 08/22/15 1515    Subjective Patient returns with reports that he has been doing his head and neck stretches and voice exercises and that his voice "is a little better."   Currently in Pain? No/denies               ADULT SLP TREATMENT - 08/22/15 0001    General Information   Behavior/Cognition Alert;Cooperative;Pleasant mood   Treatment Provided   Treatment provided Cognitive-Linquistic   Pain Assessment   Pain Assessment No/denies pain   Cognitive-Linquistic Treatment   Treatment focused on Voice   Skilled Treatment Patient was 80% accurate for executing stretches to reduce tension in the head and neck that included the masseter muscles, base of tongue, and larynx. Patient was 90% accurate for tongue trills without phonation; unable to execute trill with voicing. Patient was 50% accurate for easy-onset phonation to reduce laryngeal tension. Patient was 70% accurate for using abdominal breathing strategies with max cues from clinician.   Assessment / Recommendations / Plan   Plan Continue with current plan of care   Progression Toward Goals   Progression toward goals Progressing  toward goals          SLP Education - 08/22/15 1515    Education provided Yes   Education Details easy-onset phonation; abdominal breathing; stretches and massage to reduce laryngeal tension   Person(s) Educated Patient   Methods Explanation;Demonstration;Handout   Comprehension Verbalized understanding;Returned demonstration;Need further instruction            SLP Long Term Goals - 08/15/15 1246    SLP LONG TERM GOAL #1   Title The patient will demonstrate independent understanding of vocal hygiene concepts and neck, shoulder, lingual stretching exercises.   Status New   SLP LONG TERM GOAL #2   Title The patient will be independent for abdominal breathing and breath support exercises.   Status New   SLP LONG TERM GOAL #3   Title The patient will minimize vocal tension via Yawn-Sigh approach (or comparable technique) with min SLP cues with 80% accuracy.   Status New   SLP LONG TERM GOAL #4   Title The patient will maintain relaxed phonation / oral resonance for paragraph length recitation with 80% accuracy.   Status New   SLP LONG TERM GOAL #5   Title The patient will maximize voice quality and loudness using breath support for sustained vowel production, pitch glides, and hierarchical speech drill.   Status New          Plan - 08/22/15 1516    Clinical Impression Statement Patient returns with reports that he  has been doing his head and neck stretches and voice exercises and that his voice "is a little better." Patient continues to use excessive laryngeal muscle activation during speech. Today's session focused on reducing tension in the head and neck through stretch and massage (larynx, masseter muscles, and base of tongue) and abdominal breathing, and introduction of easy-onset phonation. Patient was stimulable for tongue trills without phonation and relaxed throat breathing through a straw.    Speech Therapy Frequency 2x / week   Duration Other (comment)  8 weeks    Treatment/Interventions Other (comment)  voice therapy   Potential to Achieve Goals Fair   Potential Considerations Ability to learn/carryover information;Severity of impairments;Family/community support   SLP Home Exercise Plan easy onset phonation; abdominal breathing; stretches and massage to reduce laryngeal tension   Consulted and Agree with Plan of Care Patient      Patient will benefit from skilled therapeutic intervention in order to improve the following deficits and impairments:   Dysphonia    Problem List Patient Active Problem List   Diagnosis Date Noted  . Cancer of larynx [facility]) 11/06/2014    Benjamin Torres 08/22/2015, 3:17 PM  [place] MAIN [facility] SERVICES [address] [place], [place], [address] Phone: [phone]   Fax:  [phone]   Name: Benjamin Torres MRN: [id number] Date of Birth: Aug 21, 1955

## 2015-08-25 ENCOUNTER — Ambulatory Visit: Payer: BLUE CROSS/BLUE SHIELD | Admitting: Speech Pathology

## 2015-08-25 ENCOUNTER — Encounter: Payer: Self-pay | Admitting: Speech Pathology

## 2015-08-25 DIAGNOSIS — R490 Dysphonia: Secondary | ICD-10-CM

## 2015-08-25 NOTE — Therapy (Signed)
[place] MAIN [facility] SERVICES [address] [place], [place], [address] Phone: [phone]   Fax:  [phone]  Speech Language Pathology Treatment  Patient Details  Name: Benjamin Torres MRN: [id number] Date of Birth: 1956-01-31 Referring Provider: Beverly Gust, MD  Encounter Date: 08/25/2015      End of Session - 08/25/15 1105    Visit Number 4   Number of Visits 17   Date for SLP Re-Evaluation 10/06/15   SLP Start Time 1000   SLP Stop Time  1100   SLP Time Calculation (min) 60 min   Activity Tolerance Patient tolerated treatment well      Past Medical History  Diagnosis Date  . 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 No pertinent past surgical history.  There were no vitals filed for this visit.      Subjective Assessment - 08/25/15 1104    Subjective The patient is eager to improve his vocal quality and resume singing.   Currently in Pain? No/denies               ADULT SLP TREATMENT - 08/25/15 0001    General Information   Behavior/Cognition Alert;Cooperative;Pleasant mood   Treatment Provided   Treatment provided Cognitive-Linquistic   Pain Assessment   Pain Assessment No/denies pain   Cognitive-Linquistic Treatment   Treatment focused on Voice   Skilled Treatment The patient was provided with written and verbal teaching regarding neck, shoulder, tongue, and throat stretches exercises to promote relaxed phonation. The patient was provided with written and verbal teaching for supplement vocal tract relaxation exercises (tongue trills with pitch change).  The patient was provided with written and verbal teaching regarding breath support exercises.  Patient instructed in relaxed phonation / oral resonance. Patient was not able to achieve full voicing across all techniques attempted.  He reports that yesterday he was voicing normally but cannot reproduce today.       Assessment / Recommendations / Plan    Plan Continue with current plan of care   Progression Toward Goals   Progression toward goals Progressing toward goals          SLP Education - 08/25/15 1104    Education provided Yes   Education Details vocal exercises   Person(s) Educated Patient   Methods Explanation   Comprehension Verbalized understanding            SLP Long Term Goals - 08/15/15 1246    SLP LONG TERM GOAL #1   Title The patient will demonstrate independent understanding of vocal hygiene concepts and neck, shoulder, lingual stretching exercises.   Status New   SLP LONG TERM GOAL #2   Title The patient will be independent for abdominal breathing and breath support exercises.   Status New   SLP LONG TERM GOAL #3   Title The patient will minimize vocal tension via Yawn-Sigh approach (or comparable technique) with min SLP cues with 80% accuracy.   Status New   SLP LONG TERM GOAL #4   Title The patient will maintain relaxed phonation / oral resonance for paragraph length recitation with 80% accuracy.   Status New   SLP LONG TERM GOAL #5   Title The patient will maximize voice quality and loudness using breath support for sustained vowel production, pitch glides, and hierarchical speech drill.   Status New          Plan - 08/25/15 1105    Clinical Impression Statement  Patient able  to achieve inconsistent voice with various techniques, not full voicing.  He is reporting periods of normal voicing at home and is instructed to try to analyze how it feels and how to reproduce the feel and sound.   Speech Therapy Frequency 2x / week   Duration Other (comment)   Treatment/Interventions Other (comment)  Voice therapy   Potential Considerations Ability to learn/carryover information;Severity of impairments;Family/community support   SLP Home Exercise Plan easy onset phonation; abdominal breathing; stretches and massage to reduce laryngeal tension   Consulted and Agree with Plan of Care Patient       Patient will benefit from skilled therapeutic intervention in order to improve the following deficits and impairments:   Dysphonia    Problem List Patient Active Problem List   Diagnosis Date Noted  . 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

## 2015-08-28 ENCOUNTER — Ambulatory Visit: Payer: BLUE CROSS/BLUE SHIELD | Admitting: Speech Pathology

## 2015-08-28 ENCOUNTER — Encounter: Payer: Self-pay | Admitting: Speech Pathology

## 2015-08-28 ENCOUNTER — Ambulatory Visit: Payer: BLUE CROSS/BLUE SHIELD | Attending: Unknown Physician Specialty | Admitting: Speech Pathology

## 2015-08-28 DIAGNOSIS — R490 Dysphonia: Secondary | ICD-10-CM | POA: Insufficient documentation

## 2015-08-28 NOTE — Therapy (Signed)
[place] MAIN [facility] SERVICES [address] [place], [place], [address] Phone: [phone]   Fax:  [phone]  Speech Language Pathology Treatment  Patient Details  Name: Benjamin Torres MRN: [id number] Date of Birth: [birth year] Referring Provider: Beverly Gust, MD  Encounter Date: 08/28/2015      End of Session - 08/28/15 1638    Visit Number 5   Number of Visits 17   Date for SLP Re-Evaluation 10/06/15   SLP Start Time [id number]   SLP Stop Time  8   SLP Time Calculation (min) 45 min   Activity Tolerance Patient tolerated treatment well      Past Medical History  Diagnosis Date  . 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 No pertinent past surgical history.  There were no vitals filed for this visit.      Subjective Assessment - 08/28/15 1637    Subjective The patient is eager to improve his vocal quality and resume singing.   Currently in Pain? No/denies               ADULT SLP TREATMENT - 08/28/15 0001    General Information   Behavior/Cognition Alert;Cooperative;Pleasant mood   Treatment Provided   Treatment provided Cognitive-Linquistic   Pain Assessment   Pain Assessment No/denies pain   Cognitive-Linquistic Treatment   Treatment focused on Voice   Skilled Treatment The patient was provided with written and verbal teaching regarding neck, shoulder, tongue, and throat stretches exercises to promote relaxed phonation. The patient was provided with written and verbal teaching for supplement vocal tract relaxation exercises (tongue trills with pitch change).  The patient was provided with written and verbal teaching regarding breath support exercises.  Patient instructed in relaxed phonation / oral resonance. Patient was able to achieve true but strained voicing laryngeal manipulation.  He reports that he continues to have periods of voicing normally but cannot reproduce.       Assessment /  Recommendations / Plan   Plan Continue with current plan of care   Progression Toward Goals   Progression toward goals Progressing toward goals          SLP Education - 08/28/15 1637    Education provided Yes   Education Details laryngeal manipulation   Person(s) Educated Patient   Methods Explanation   Comprehension Verbalized understanding            SLP Long Term Goals - 08/15/15 1246    SLP LONG TERM GOAL #1   Title The patient will demonstrate independent understanding of vocal hygiene concepts and neck, shoulder, lingual stretching exercises.   Status New   SLP LONG TERM GOAL #2   Title The patient will be independent for abdominal breathing and breath support exercises.   Status New   SLP LONG TERM GOAL #3   Title The patient will minimize vocal tension via Yawn-Sigh approach (or comparable technique) with min SLP cues with 80% accuracy.   Status New   SLP LONG TERM GOAL #4   Title The patient will maintain relaxed phonation / oral resonance for paragraph length recitation with 80% accuracy.   Status New   SLP LONG TERM GOAL #5   Title The patient will maximize voice quality and loudness using breath support for sustained vowel production, pitch glides, and hierarchical speech drill.   Status New          Plan - 08/28/15 1638    Clinical Impression Statement  Patient  able to achieve improved voice, albeit strained, with laryngeal manipulation. He is reporting periods of normal voicing at home and is instructed to try to analyze how it feels and how to reproduce the feel and sound.   Speech Therapy Frequency 2x / week   Duration Other (comment)   Treatment/Interventions Other (comment)  Voice therapy   Potential to Achieve Goals Fair   Potential Considerations Ability to learn/carryover information;Severity of impairments;Family/community support   SLP Home Exercise Plan easy onset phonation; abdominal breathing; stretches and massage to reduce laryngeal  tension; laryngeal manipulation   Consulted and Agree with Plan of Care Patient      Patient will benefit from skilled therapeutic intervention in order to improve the following deficits and impairments:   Dysphonia    Problem List Patient Active Problem List   Diagnosis Date Noted  . 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

## 2015-08-31 ENCOUNTER — Ambulatory Visit: Payer: BLUE CROSS/BLUE SHIELD | Admitting: Speech Pathology

## 2015-08-31 DIAGNOSIS — R490 Dysphonia: Secondary | ICD-10-CM

## 2015-09-01 ENCOUNTER — Encounter: Payer: Self-pay | Admitting: Speech Pathology

## 2015-09-01 NOTE — Therapy (Signed)
[place] MAIN [facility] SERVICES [address] [place], [place], [address] Phone: [phone]   Fax:  [phone]  Speech Language Pathology Treatment  Patient Details  Name: Benjamin Torres MRN: [id number] Date of Birth: 05/29/1955 Referring Provider: Beverly Gust, MD  Encounter Date: 08/31/2015      End of Session - 09/01/15 1119    Visit Number 6   Number of Visits 17   Date for SLP Re-Evaluation 10/06/15   SLP Start Time 12   SLP Stop Time  1650   SLP Time Calculation (min) 50 min   Activity Tolerance Patient tolerated treatment well      Past Medical History  Diagnosis Date  . 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 No pertinent past surgical history.  There were no vitals filed for this visit.      Subjective Assessment - 09/01/15 1118    Subjective The patient is very frustrated by slow progress to date.               ADULT SLP TREATMENT - 09/01/15 0001    General Information   Behavior/Cognition Alert;Cooperative;Pleasant mood   Treatment Provided   Treatment provided Cognitive-Linquistic   Pain Assessment   Pain Assessment No/denies pain   Cognitive-Linquistic Treatment   Treatment focused on Voice   Skilled Treatment The patient was provided with written and verbal teaching regarding neck, shoulder, tongue, and throat stretches exercises to promote relaxed phonation. The patient was provided with written and verbal teaching for supplement vocal tract relaxation exercises (tongue trills with pitch change).  The patient was provided with written and verbal teaching regarding breath support exercises.  Patient instructed in relaxed phonation / oral resonance. Patient was able to achieve true but strained voicing laryngeal manipulation.  He was able to achieve and repeat true voicing with high pitched call ("wooooooo").  He reports that he continues to have periods of voicing normally but cannot  reproduce.       Assessment / Recommendations / Plan   Plan Continue with current plan of care   Progression Toward Goals   Progression toward goals Progressing toward goals          SLP Education - 09/01/15 1118    Education provided Yes   Education Details variety of oral resonance / relaxed phonation techniques   Person(s) Educated Patient   Methods Explanation   Comprehension Verbalized understanding            SLP Long Term Goals - 08/15/15 1246    SLP LONG TERM GOAL #1   Title The patient will demonstrate independent understanding of vocal hygiene concepts and neck, shoulder, lingual stretching exercises.   Status New   SLP LONG TERM GOAL #2   Title The patient will be independent for abdominal breathing and breath support exercises.   Status New   SLP LONG TERM GOAL #3   Title The patient will minimize vocal tension via Yawn-Sigh approach (or comparable technique) with min SLP cues with 80% accuracy.   Status New   SLP LONG TERM GOAL #4   Title The patient will maintain relaxed phonation / oral resonance for paragraph length recitation with 80% accuracy.   Status New   SLP LONG TERM GOAL #5   Title The patient will maximize voice quality and loudness using breath support for sustained vowel production, pitch glides, and hierarchical speech drill.   Status New  Plan - 09/01/15 1119    Clinical Impression Statement Patient able to achieve improved voice, albeit strained, with laryngeal manipulation. Today he had more success with high pitched call.  He is reporting periods of normal voicing at home and is instructed to try to analyze how it feels and how to reproduce the feel and sound.     Speech Therapy Frequency 2x / week   Duration Other (comment)   Treatment/Interventions Other (comment)  Voice therapy   Potential to Achieve Goals Fair   Potential Considerations Ability to learn/carryover information;Severity of impairments;Family/community support    SLP Home Exercise Plan easy onset phonation; abdominal breathing; stretches and massage to reduce laryngeal tension; laryngeal manipulation   Consulted and Agree with Plan of Care Patient      Patient will benefit from skilled therapeutic intervention in order to improve the following deficits and impairments:   Dysphonia    Problem List Patient Active Problem List   Diagnosis Date Noted  . 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

## 2015-09-04 ENCOUNTER — Ambulatory Visit: Payer: BLUE CROSS/BLUE SHIELD | Admitting: Speech Pathology

## 2015-09-04 DIAGNOSIS — R490 Dysphonia: Secondary | ICD-10-CM

## 2015-09-05 ENCOUNTER — Encounter: Payer: Self-pay | Admitting: Speech Pathology

## 2015-09-05 NOTE — Therapy (Signed)
[place] MAIN [facility] SERVICES [address] [place], [place], [address] Phone: [phone]   Fax:  [phone]  Speech Language Pathology Treatment/Progress Note  Patient Details  Name: Benjamin Torres MRN: [id number] Date of Birth: 07-01-1955 Referring Provider: Beverly Gust, MD  Encounter Date: 09/04/2015      End of Session - 09/05/15 1646    Visit Number 7   Number of Visits 17   Date for SLP Re-Evaluation 10/06/15   SLP Start Time 37   SLP Stop Time  1650   SLP Time Calculation (min) 50 min   Activity Tolerance Patient tolerated treatment well      Past Medical History  Diagnosis Date  . 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 No pertinent past surgical history.  There were no vitals filed for this visit.      Subjective Assessment - 09/05/15 1645    Subjective The patient is very frustrated with his inability to release vocal tension.   Currently in Pain? No/denies               ADULT SLP TREATMENT - 09/05/15 0001    General Information   Behavior/Cognition Alert;Cooperative;Pleasant mood   Treatment Provided   Treatment provided Cognitive-Linquistic   Pain Assessment   Pain Assessment No/denies pain   Cognitive-Linquistic Treatment   Treatment focused on Voice   Skilled Treatment The patient was provided with written and verbal teaching regarding neck, shoulder, tongue, and throat stretches exercises to promote relaxed phonation. The patient was provided with written and verbal teaching for supplement vocal tract relaxation exercises (tongue trills with pitch change).  The patient was provided with written and verbal teaching regarding breath support exercises.  Patient instructed in relaxed phonation / oral resonance. Patient was able to achieve true but strained voicing laryngeal manipulation.  He was able to achieve and repeat true voicing with high pitched call ("wooooooo").  Today we tried a  variety of techniques to produce true voice, including chant talk, chewing, head positioning, inhalation phonation, redirected phonation and tongue protrusion "e".  He reports that he continues to have periods of voicing normally but cannot reproduce.       Assessment / Recommendations / Plan   Plan Continue with current plan of care   Progression Toward Goals   Progression toward goals Progressing toward goals          SLP Education - 09/05/15 1645    Education provided Yes   Education Details variety of oral resonance / relaxed phonation techniques   Person(s) Educated Patient   Methods Explanation   Comprehension Verbalized understanding            SLP Long Term Goals - 09/05/15 1647    SLP LONG TERM GOAL #1   Title The patient will demonstrate independent understanding of vocal hygiene concepts and neck, shoulder, lingual stretching exercises.   Status On-going   SLP LONG TERM GOAL #2   Title The patient will be independent for abdominal breathing and breath support exercises.   Status On-going   SLP LONG TERM GOAL #3   Title The patient will minimize vocal tension via Yawn-Sigh approach (or comparable technique) with min SLP cues with 80% accuracy.   Status Partially Met   SLP LONG TERM GOAL #4   Title The patient will maintain relaxed phonation / oral resonance for paragraph length recitation with 80% accuracy.   Status Partially Met   SLP LONG TERM GOAL #  5   Title The patient will maximize voice quality and loudness using breath support for sustained vowel production, pitch glides, and hierarchical speech drill.   Status Deferred          Plan - 09/05/15 1646    Clinical Impression Statement Patient able to achieve improved voice, albeit strained, with laryngeal manipulation and high pitched call.  He is reporting periods of normal voicing at home, but cannot reproduce.  We have tried a variety of techniques to reduce the tension for true voicing, but he has not  been consistently responsive.  The patient may benefit from referral to a speech therapist that specializes in voice such as at the [facility] or the [facility].   Speech Therapy Frequency 2x / week   Duration 1 week   Treatment/Interventions Other (comment)  Voice therapy   Potential to Achieve Goals Fair   Potential Considerations Ability to learn/carryover information;Severity of impairments;Family/community support   Consulted and Agree with Plan of Care Patient      Patient will benefit from skilled therapeutic intervention in order to improve the following deficits and impairments:   Dysphonia    Problem List Patient Active Problem List   Diagnosis Date Noted  . 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 [place], [place], [address] Phone: [phone]   Fax:  [phone]   Name: Benjamin Torres MRN: [id number] Date of Birth: [birth year]

## 2015-09-07 ENCOUNTER — Ambulatory Visit: Payer: BLUE CROSS/BLUE SHIELD | Admitting: Speech Pathology

## 2015-09-11 ENCOUNTER — Encounter: Payer: Self-pay | Admitting: Speech Pathology

## 2015-09-11 ENCOUNTER — Ambulatory Visit: Payer: BLUE CROSS/BLUE SHIELD | Admitting: Speech Pathology

## 2015-09-11 DIAGNOSIS — R490 Dysphonia: Secondary | ICD-10-CM

## 2015-09-11 NOTE — Therapy (Signed)
[place] MAIN [facility] SERVICES [address] [place], [place], [address] Phone: [phone]   Fax:  [phone]  Speech Language Pathology Treatment  Patient Details  Name: Benjamin Torres MRN: [id number] Date of Birth: July 17, 1955 Referring Provider: Beverly Gust, MD  Encounter Date: 09/11/2015      End of Session - 09/11/15 1639    Visit Number 8   Number of Visits 17   Date for SLP Re-Evaluation 10/06/15   SLP Start Time 1017   SLP Stop Time  38   SLP Time Calculation (min) 45 min   Activity Tolerance Patient tolerated treatment well      Past Medical History  Diagnosis Date  . 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 No pertinent past surgical history.  There were no vitals filed for this visit.      Subjective Assessment - 09/11/15 1638    Subjective The patient remains very frustrated with his inability to release vocal tension.   Currently in Pain? No/denies               ADULT SLP TREATMENT - 09/11/15 0001    General Information   Behavior/Cognition Alert;Cooperative;Pleasant mood   Treatment Provided   Treatment provided Cognitive-Linquistic   Pain Assessment   Pain Assessment No/denies pain   Cognitive-Linquistic Treatment   Treatment focused on Voice   Skilled Treatment The patient was provided with written and verbal teaching regarding neck, shoulder, tongue, and throat stretches exercises to promote relaxed phonation. The patient was provided with written and verbal teaching for supplement vocal tract relaxation exercises (tongue trills with pitch change).  The patient was provided with written and verbal teaching regarding breath support exercises.  Patient instructed in relaxed phonation / oral resonance. Patient was able to achieve true but strained voicing laryngeal manipulation.  He was able to achieve and repeat true voicing with high pitched call ("wooooooo").  We have tried a variety  of techniques to produce true voice, including chant talk, chewing, head positioning, inhalation phonation, redirected phonation and tongue protrusion "e".  He reports that he continues to have periods of voicing normally but cannot reproduce.    Today he was most successful with flow phonation, achieving strained but true voicing.  He will continue to practice this at home after taking his muscle relaxant.    Assessment / Recommendations / Plan   Plan Continue with current plan of care   Progression Toward Goals   Progression toward goals Progressing toward goals          SLP Education - 09/11/15 1638    Education provided Yes   Education Details Variety of oral resonance /  relaxed phonation techniques   Person(s) Educated Patient   Methods Explanation   Comprehension Verbalized understanding            SLP Long Term Goals - 09/05/15 1647    SLP LONG TERM GOAL #1   Title The patient will demonstrate independent understanding of vocal hygiene concepts and neck, shoulder, lingual stretching exercises.   Status On-going   SLP LONG TERM GOAL #2   Title The patient will be independent for abdominal breathing and breath support exercises.   Status On-going   SLP LONG TERM GOAL #3   Title The patient will minimize vocal tension via Yawn-Sigh approach (or comparable technique) with min SLP cues with 80% accuracy.   Status Partially Met   SLP LONG TERM GOAL #4   Title  The patient will maintain relaxed phonation / oral resonance for paragraph length recitation with 80% accuracy.   Status Partially Met   SLP LONG TERM GOAL #5   Title The patient will maximize voice quality and loudness using breath support for sustained vowel production, pitch glides, and hierarchical speech drill.   Status Deferred          Plan - 09/11/15 1639    Clinical Impression Statement  Patient able to achieve improved voice, albeit strained, with laryngeal manipulation and high pitched call.  He is  reporting periods of normal voicing at home, but cannot reproduce.  We have tried a variety of techniques to reduce the tension for true voicing, but he has not been consistently responsive.  The patient reports that Dr. 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  The patient will try his vocal exercises after he takes a dose.   Speech Therapy Frequency 2x / week   Duration Other (comment)  8 weeks   Treatment/Interventions Other (comment)  Voice therapy   Potential to Achieve Goals Fair   Potential Considerations Ability to learn/carryover information;Severity of impairments;Family/community support   SLP Home Exercise Plan easy onset phonation; abdominal breathing; stretches and massage to reduce laryngeal tension; laryngeal manipulation   Consulted and Agree with Plan of Care Patient      Patient will benefit from skilled therapeutic intervention in order to improve the following deficits and impairments:   Dysphonia    Problem List Patient Active Problem List   Diagnosis Date Noted  . 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 [place], [place], [address] Phone: [phone]   Fax:  [phone]   Name: Benjamin Torres MRN: [id number] Date of Birth: 1956-02-16

## 2015-09-14 ENCOUNTER — Ambulatory Visit: Payer: BLUE CROSS/BLUE SHIELD | Admitting: Speech Pathology

## 2015-09-14 DIAGNOSIS — R490 Dysphonia: Secondary | ICD-10-CM | POA: Diagnosis not present

## 2015-09-14 NOTE — Therapy (Signed)
[place] MAIN [facility] SERVICES [address] [place], [place], [address] Phone: [phone]   Fax:  [phone]  Speech Language Pathology Treatment  Patient Details  Name: Benjamin Torres MRN: [id number] Date of Birth: 06/09/1955 Referring Provider: Beverly Gust, MD  Encounter Date: 09/14/2015      End of Session - 09/14/15 1707    Visit Number 9   Number of Visits 17   Date for SLP Re-Evaluation 10/06/15   SLP Start Time 55   SLP Stop Time  4332   SLP Time Calculation (min) 45 min   Activity Tolerance Patient tolerated treatment well      Past Medical History  Diagnosis Date  . 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  There were no vitals filed for this visit.      Subjective Assessment - 09/14/15 1707    Subjective The patient is relieved with the improvement in his voice.   Currently in Pain? No/denies               ADULT SLP TREATMENT - 09/14/15 0001    General Information   Behavior/Cognition Alert;Cooperative;Pleasant mood   Treatment Provided   Treatment provided Cognitive-Linquistic   Pain Assessment   Pain Assessment No/denies pain   Cognitive-Linquistic Treatment   Treatment focused on Voice   Skilled Treatment The patient was provided with written and verbal teaching regarding neck, shoulder, tongue, and throat stretches exercises to promote relaxed phonation. The patient was provided with written and verbal teaching for supplement vocal tract relaxation exercises (tongue trills with pitch change).  The patient was provided with written and verbal teaching regarding breath support exercises.  Patient instructed in relaxed phonation / oral resonance. Patient was able to achieve true but strained voicing laryngeal manipulation.  He was able to achieve and repeat true voicing with high pitched call ("wooooooo").  We have tried a variety of techniques to produce true voice,  including chant talk, chewing, head positioning, inhalation phonation, redirected phonation and tongue protrusion "e".  He reports that he continues to have periods of voicing normally but cannot reproduce.    The patient's voice is much improved today, able to generate single words and sentences with true voice, moderately strained.   Assessment / Recommendations / Plan   Plan Continue with current plan of care   Progression Toward Goals   Progression toward goals Progressing toward goals          SLP Education - 09/14/15 1707    Education provided Yes   Education Details cough rescue strategy   Person(s) Educated Patient   Methods Explanation   Comprehension Verbalized understanding            SLP Long Term Goals - 09/05/15 1647    SLP LONG TERM GOAL #1   Title The patient will demonstrate independent understanding of vocal hygiene concepts and neck, shoulder, lingual stretching exercises.   Status On-going   SLP LONG TERM GOAL #2   Title The patient will be independent for abdominal breathing and breath support exercises.   Status On-going   SLP LONG TERM GOAL #3   Title The patient will minimize vocal tension via Yawn-Sigh approach (or comparable technique) with min SLP cues with 80% accuracy.   Status Partially Met   SLP LONG TERM GOAL #4   Title The patient will maintain relaxed phonation / oral resonance for paragraph length recitation with 80% accuracy.  Status Partially Met   SLP LONG TERM GOAL #5   Title The patient will maximize voice quality and loudness using breath support for sustained vowel production, pitch glides, and hierarchical speech drill.   Status Deferred          Plan - 09/14/15 1708    Clinical Impression Statement Patient able to achieve improved voice, albeit strained, with call and vocal loudness.  The patient reports that the muscle relaxant Dr. 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     Speech  Therapy Frequency 2x / week   Duration Other (comment)   Treatment/Interventions Other (comment)  Voice therapy   Potential Considerations Ability to learn/carryover information;Severity of impairments;Family/community support   SLP Home Exercise Plan easy onset phonation; abdominal breathing; stretches and massage to reduce laryngeal tension; laryngeal manipulation   Consulted and Agree with Plan of Care Patient      Patient will benefit from skilled therapeutic intervention in order to improve the following deficits and impairments:   Dysphonia    Problem List Patient Active Problem List   Diagnosis Date Noted  . 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

## 2015-09-18 ENCOUNTER — Ambulatory Visit: Payer: BLUE CROSS/BLUE SHIELD | Admitting: Speech Pathology

## 2015-09-18 ENCOUNTER — Encounter: Payer: Self-pay | Admitting: Speech Pathology

## 2015-09-18 DIAGNOSIS — R490 Dysphonia: Secondary | ICD-10-CM | POA: Diagnosis not present

## 2015-09-18 NOTE — Therapy (Signed)
[place] MAIN [facility] SERVICES [address] [place], [place], [address] Phone: [phone]   Fax:  [phone]  Speech Language Pathology Treatment  Patient Details  Name: Benjamin Torres MRN: [id number] Date of Birth: 1955-12-29 Referring Provider: Beverly Gust, MD  Encounter Date: 09/18/2015      End of Session - 09/18/15 1633    Visit Number 10   Number of Visits 17   Date for SLP Re-Evaluation 10/06/15   SLP Start Time 3154   SLP Stop Time  74   SLP Time Calculation (min) 45 min   Activity Tolerance Patient tolerated treatment well      Past Medical History  Diagnosis Date  . 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 No pertinent past surgical history.  There were no vitals filed for this visit.      Subjective Assessment - 09/18/15 1633    Subjective The patient is relieved with the improvement in his voice.   Currently in Pain? No/denies               ADULT SLP TREATMENT - 09/18/15 0001    General Information   Behavior/Cognition Alert;Cooperative;Pleasant mood   Treatment Provided   Treatment provided Cognitive-Linquistic   Pain Assessment   Pain Assessment No/denies pain   Cognitive-Linquistic Treatment   Treatment focused on Voice   Skilled Treatment The patient was provided with written and verbal teaching regarding neck, shoulder, tongue, and throat stretches exercises to promote relaxed phonation. The patient was provided with written and verbal teaching for supplement vocal tract relaxation exercises (tongue trills with pitch change).  The patient was provided with written and verbal teaching regarding breath support exercises.  Patient instructed in relaxed phonation / oral resonance. Patient was able to achieve true but strained voicing laryngeal manipulation.  He was able to achieve and repeat true voicing with high pitched call ("wooooooo") and hum.  He reports that he continues to have  periods of voicing normally but cannot reproduce.    The patient's voice is, again, much improved today, able to generate single words and sentences with true voice, moderately strained.  Overall pitch continues to be high for his age and gender.   Assessment / Recommendations / Plan   Plan Continue with current plan of care   Progression Toward Goals   Progression toward goals Progressing toward goals          SLP Education - 09/18/15 1633    Education provided Yes   Education Details pitch changes   Person(s) Educated Patient   Methods Explanation   Comprehension Verbalized understanding            SLP Long Term Goals - 09/05/15 1647    SLP LONG TERM GOAL #1   Title The patient will demonstrate independent understanding of vocal hygiene concepts and neck, shoulder, lingual stretching exercises.   Status On-going   SLP LONG TERM GOAL #2   Title The patient will be independent for abdominal breathing and breath support exercises.   Status On-going   SLP LONG TERM GOAL #3   Title The patient will minimize vocal tension via Yawn-Sigh approach (or comparable technique) with min SLP cues with 80% accuracy.   Status Partially Met   SLP LONG TERM GOAL #4   Title The patient will maintain relaxed phonation / oral resonance for paragraph length recitation with 80% accuracy.   Status Partially Met   SLP LONG TERM GOAL #5  Title The patient will maximize voice quality and loudness using breath support for sustained vowel production, pitch glides, and hierarchical speech drill.   Status Deferred          Plan - 09/18/15 7127    Clinical Impression Statement  Patient able to achieve improved voice, albeit strained, with call, vocal loudness, and nasality (hum).  The patient reports that the muscle relaxant Dr. 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     Speech Therapy Frequency 2x / week   Duration Other (comment)    Treatment/Interventions Other (comment)  Voice therapy   Potential to Achieve Goals Fair   Potential Considerations Ability to learn/carryover information;Severity of impairments;Family/community support   SLP Home Exercise Plan easy onset phonation; abdominal breathing; stretches and massage to reduce laryngeal tension; laryngeal manipulation; nasality; oral resonance; vocal loudness   Consulted and Agree with Plan of Care Patient      Patient will benefit from skilled therapeutic intervention in order to improve the following deficits and impairments:   Dysphonia    Problem List Patient Active Problem List   Diagnosis Date Noted  . 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

## 2015-09-21 ENCOUNTER — Ambulatory Visit: Payer: BLUE CROSS/BLUE SHIELD | Admitting: Speech Pathology

## 2015-09-21 ENCOUNTER — Encounter: Payer: Self-pay | Admitting: Speech Pathology

## 2015-09-21 DIAGNOSIS — R490 Dysphonia: Secondary | ICD-10-CM

## 2015-09-21 NOTE — Therapy (Signed)
[place] MAIN [facility] SERVICES [address] [place], [place], [address] Phone: [phone]   Fax:  [phone]  Speech Language Pathology Treatment  Patient Details  Name: Benjamin Torres MRN: [id number] Date of Birth: [birth year] Referring Provider: Beverly Gust, MD  Encounter Date: 09/21/2015      End of Session - 09/21/15 1707    Visit Number 11   Number of Visits 17   Date for SLP Re-Evaluation 10/06/15   SLP Start Time 1600   SLP Stop Time  1645   SLP Time Calculation (min) 45 min   Activity Tolerance Patient tolerated treatment well      Past Medical History  Diagnosis Date  . 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 No pertinent past surgical history.  There were no vitals filed for this visit.      Subjective Assessment - 09/21/15 1706    Subjective The patient is relieved with the improvement in his voice, but frustrated with the ups and downs he is experiencing.   Currently in Pain? No/denies               ADULT SLP TREATMENT - 09/21/15 0001    General Information   Behavior/Cognition Alert;Cooperative;Pleasant mood   Treatment Provided   Treatment provided Cognitive-Linquistic   Pain Assessment   Pain Assessment No/denies pain   Cognitive-Linquistic Treatment   Treatment focused on Voice   Skilled Treatment The patient was provided with written and verbal teaching regarding neck, shoulder, tongue, and throat stretches exercises to promote relaxed phonation. The patient was provided with written and verbal teaching for supplement vocal tract relaxation exercises (tongue trills with pitch change).  The patient was provided with written and verbal teaching regarding breath support exercises.  Patient instructed in relaxed phonation / oral resonance. Patient was able to achieve true but strained voicing laryngeal manipulation.  He was able to achieve and repeat true voicing with high pitched call  ("wooooooo"), tongue trills, hum.  He reports that he continues to have periods of voicing normally but cannot reproduce.    The patient's voice was initially too strained for true phonation but he was able to achieved strained but true phonation via laryngeal manipulation.  Once elicited, the patient was able to generate single words and sentences with true voice, moderately strained.  Overall pitch continues to be high for his age and gender.   Assessment / Recommendations / Plan   Plan Continue with current plan of care   Progression Toward Goals   Progression toward goals Progressing toward goals          SLP Education - 09/21/15 1706    Education provided Yes   Education Details laryngeal manipulation   Person(s) Educated Patient   Methods Explanation   Comprehension Verbalized understanding            SLP Long Term Goals - 09/05/15 1647    SLP LONG TERM GOAL #1   Title The patient will demonstrate independent understanding of vocal hygiene concepts and neck, shoulder, lingual stretching exercises.   Status On-going   SLP LONG TERM GOAL #2   Title The patient will be independent for abdominal breathing and breath support exercises.   Status On-going   SLP LONG TERM GOAL #3   Title The patient will minimize vocal tension via Yawn-Sigh approach (or comparable technique) with min SLP cues with 80% accuracy.   Status Partially Met   SLP LONG TERM GOAL #4  Title The patient will maintain relaxed phonation / oral resonance for paragraph length recitation with 80% accuracy.   Status Partially Met   SLP LONG TERM GOAL #5   Title The patient will maximize voice quality and loudness using breath support for sustained vowel production, pitch glides, and hierarchical speech drill.   Status Deferred          Plan - 09/21/15 1707    Clinical Impression Statement  Patient able to achieve improved voice, albeit strained, with call, vocal loudness, and nasality (hum).  He has been  given several strategies to try when he feels he can only whisper.     Speech Therapy Frequency 2x / week   Duration Other (comment)   Treatment/Interventions Other (comment)  Voice therapy   Potential to Achieve Goals Fair   Potential Considerations Ability to learn/carryover information;Severity of impairments;Family/community support   SLP Home Exercise Plan easy onset phonation; abdominal breathing; stretches and massage to reduce laryngeal tension; laryngeal manipulation; nasality; oral resonance; vocal loudness   Consulted and Agree with Plan of Care Patient      Patient will benefit from skilled therapeutic intervention in order to improve the following deficits and impairments:   Dysphonia    Problem List Patient Active Problem List   Diagnosis Date Noted  . 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 [place], [place], [address] Phone: [phone]   Fax:  [phone]   Name: Benjamin Torres MRN: [id number] Date of Birth: 1956/04/26

## 2015-09-26 ENCOUNTER — Encounter: Payer: Self-pay | Admitting: Speech Pathology

## 2015-09-26 ENCOUNTER — Ambulatory Visit: Payer: BLUE CROSS/BLUE SHIELD | Admitting: Speech Pathology

## 2015-09-26 DIAGNOSIS — R490 Dysphonia: Secondary | ICD-10-CM

## 2015-09-26 NOTE — Therapy (Signed)
[place] MAIN [facility] SERVICES [address] [place], [place], [address] Phone: [phone]   Fax:  [phone]  Speech Language Pathology Treatment  Patient Details  Name: Benjamin Torres MRN: [id number] Date of Birth: 1956-04-26 Referring Provider: Beverly Gust, MD  Encounter Date: 09/26/2015      End of Session - 09/26/15 1705    Visit Number 12   Number of Visits 17   Date for SLP Re-Evaluation 10/06/15   SLP Start Time 33   SLP Stop Time  9935   SLP Time Calculation (min) 45 min   Activity Tolerance Patient tolerated treatment well      Past Medical History  Diagnosis Date  . 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 No pertinent past surgical history.  There were no vitals filed for this visit.      Subjective Assessment - 09/26/15 1704    Subjective The patient is relieved with the improvement in his voice, but frustrated with the ups and downs he is experiencing.   Currently in Pain? No/denies                 SLP Education - 09/26/15 1704    Education provided Yes   Education Details variety of relaxed phonation / oral resonance strategies   Person(s) Educated Patient   Methods Explanation   Comprehension Verbalized understanding            SLP Long Term Goals - 09/05/15 1647    SLP LONG TERM GOAL #1   Title The patient will demonstrate independent understanding of vocal hygiene concepts and neck, shoulder, lingual stretching exercises.   Status On-going   SLP LONG TERM GOAL #2   Title The patient will be independent for abdominal breathing and breath support exercises.   Status On-going   SLP LONG TERM GOAL #3   Title The patient will minimize vocal tension via Yawn-Sigh approach (or comparable technique) with min SLP cues with 80% accuracy.   Status Partially Met   SLP LONG TERM GOAL #4   Title The patient will maintain relaxed phonation / oral resonance for paragraph length  recitation with 80% accuracy.   Status Partially Met   SLP LONG TERM GOAL #5   Title The patient will maximize voice quality and loudness using breath support for sustained vowel production, pitch glides, and hierarchical speech drill.   Status Deferred          Plan - 09/26/15 1705    Clinical Impression Statement  Patient able to achieve improved voice, albeit strained, with call, vocal loudness, and nasality (hum).  He has been given several strategies to try when he feels he can only whisper.     Speech Therapy Frequency 2x / week   Duration Other (comment)   Treatment/Interventions Other (comment)  Voice therapy   Potential to Achieve Goals Fair   Potential Considerations Ability to learn/carryover information;Severity of impairments;Family/community support   SLP Home Exercise Plan easy onset phonation; abdominal breathing; stretches and massage to reduce laryngeal tension; laryngeal manipulation; nasality; oral resonance; vocal loudness   Consulted and Agree with Plan of Care Patient      Patient will benefit from skilled therapeutic intervention in order to improve the following deficits and impairments:   Dysphonia    Problem List Patient Active Problem List   Diagnosis Date Noted  . 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

## 2015-09-28 ENCOUNTER — Ambulatory Visit: Payer: BLUE CROSS/BLUE SHIELD | Admitting: Speech Pathology

## 2015-09-29 ENCOUNTER — Other Ambulatory Visit: Payer: Self-pay | Admitting: *Deleted

## 2015-09-29 DIAGNOSIS — C159 Malignant neoplasm of esophagus, unspecified: Secondary | ICD-10-CM

## 2015-09-29 MED ORDER — LEVOTHYROXINE SODIUM 112 MCG PO TABS
112.0000 ug | ORAL_TABLET | Freq: Every day | ORAL | Status: DC
Start: 2015-09-29 — End: 2015-12-21

## 2015-09-29 MED ORDER — LISINOPRIL 10 MG PO TABS: ORAL_TABLET | ORAL | Status: DC

## 2015-10-02 ENCOUNTER — Ambulatory Visit: Payer: BLUE CROSS/BLUE SHIELD | Admitting: Speech Pathology

## 2015-10-05 ENCOUNTER — Ambulatory Visit: Payer: BLUE CROSS/BLUE SHIELD | Admitting: Speech Pathology

## 2015-10-09 ENCOUNTER — Ambulatory Visit: Payer: BLUE CROSS/BLUE SHIELD | Admitting: Speech Pathology

## 2015-10-12 ENCOUNTER — Ambulatory Visit: Payer: BLUE CROSS/BLUE SHIELD | Admitting: Speech Pathology

## 2015-10-16 ENCOUNTER — Ambulatory Visit: Payer: BLUE CROSS/BLUE SHIELD | Admitting: Speech Pathology

## 2015-10-18 ENCOUNTER — Ambulatory Visit: Payer: BLUE CROSS/BLUE SHIELD | Admitting: Speech Pathology

## 2015-10-23 ENCOUNTER — Ambulatory Visit: Payer: BLUE CROSS/BLUE SHIELD | Admitting: Speech Pathology

## 2015-10-26 ENCOUNTER — Ambulatory Visit: Payer: BLUE CROSS/BLUE SHIELD | Admitting: Speech Pathology

## 2015-11-28 ENCOUNTER — Ambulatory Visit: Payer: BLUE CROSS/BLUE SHIELD | Admitting: Oncology

## 2015-11-28 ENCOUNTER — Other Ambulatory Visit: Payer: BLUE CROSS/BLUE SHIELD

## 2015-12-20 ENCOUNTER — Other Ambulatory Visit: Payer: Self-pay

## 2015-12-20 DIAGNOSIS — C329 Malignant neoplasm of larynx, unspecified: Secondary | ICD-10-CM

## 2015-12-21 ENCOUNTER — Inpatient Hospital Stay: Payer: BLUE CROSS/BLUE SHIELD | Attending: Hematology and Oncology

## 2015-12-21 ENCOUNTER — Encounter (INDEPENDENT_AMBULATORY_CARE_PROVIDER_SITE_OTHER): Payer: Self-pay

## 2015-12-21 ENCOUNTER — Other Ambulatory Visit: Payer: Self-pay | Admitting: *Deleted

## 2015-12-21 ENCOUNTER — Inpatient Hospital Stay (HOSPITAL_BASED_OUTPATIENT_CLINIC_OR_DEPARTMENT_OTHER): Payer: BLUE CROSS/BLUE SHIELD | Admitting: Hematology and Oncology

## 2015-12-21 VITALS — BP 162/94 | HR 67 | Temp 95.0°F | Resp 18 | Wt 184.9 lb

## 2015-12-21 DIAGNOSIS — Z8521 Personal history of malignant neoplasm of larynx: Secondary | ICD-10-CM | POA: Insufficient documentation

## 2015-12-21 DIAGNOSIS — R490 Dysphonia: Secondary | ICD-10-CM | POA: Insufficient documentation

## 2015-12-21 DIAGNOSIS — E890 Postprocedural hypothyroidism: Secondary | ICD-10-CM | POA: Insufficient documentation

## 2015-12-21 DIAGNOSIS — E038 Other specified hypothyroidism: Secondary | ICD-10-CM

## 2015-12-21 DIAGNOSIS — Z79899 Other long term (current) drug therapy: Secondary | ICD-10-CM | POA: Insufficient documentation

## 2015-12-21 DIAGNOSIS — Z923 Personal history of irradiation: Secondary | ICD-10-CM | POA: Insufficient documentation

## 2015-12-21 DIAGNOSIS — C329 Malignant neoplasm of larynx, unspecified: Secondary | ICD-10-CM

## 2015-12-21 DIAGNOSIS — Z87891 Personal history of nicotine dependence: Secondary | ICD-10-CM

## 2015-12-21 DIAGNOSIS — C159 Malignant neoplasm of esophagus, unspecified: Secondary | ICD-10-CM

## 2015-12-21 LAB — CBC WITH DIFFERENTIAL/PLATELET
Basophils Absolute: 0 10*3/uL (ref 0–0.1)
Basophils Relative: 1 %
Eosinophils Absolute: 0.1 10*3/uL (ref 0–0.7)
Eosinophils Relative: 2 %
HCT: 44.2 % (ref 40.0–52.0)
Hemoglobin: 15 g/dL (ref 13.0–18.0)
Lymphocytes Relative: 19 %
Lymphs Abs: 1.1 10*3/uL (ref 1.0–3.6)
MCH: 29.5 pg (ref 26.0–34.0)
MCHC: 34.1 g/dL (ref 32.0–36.0)
MCV: 86.6 fL (ref 80.0–100.0)
Monocytes Absolute: 0.5 10*3/uL (ref 0.2–1.0)
Monocytes Relative: 8 %
Neutro Abs: 4 10*3/uL (ref 1.4–6.5)
Neutrophils Relative %: 70 %
Platelets: 182 10*3/uL (ref 150–440)
RBC: 5.1 MIL/uL (ref 4.40–5.90)
RDW: 14.5 % (ref 11.5–14.5)
WBC: 5.7 10*3/uL (ref 3.8–10.6)

## 2015-12-21 LAB — COMPREHENSIVE METABOLIC PANEL
ALT: 14 U/L — ABNORMAL LOW (ref 17–63)
AST: 21 U/L (ref 15–41)
Albumin: 4.2 g/dL (ref 3.5–5.0)
Alkaline Phosphatase: 44 U/L (ref 38–126)
Anion gap: 5 (ref 5–15)
BUN: 14 mg/dL (ref 6–20)
CO2: 24 mmol/L (ref 22–32)
Calcium: 8.5 mg/dL — ABNORMAL LOW (ref 8.9–10.3)
Chloride: 106 mmol/L (ref 101–111)
Creatinine, Ser: 0.74 mg/dL (ref 0.61–1.24)
GFR calc Af Amer: >60 mL/min (ref >60)
GFR calc non Af Amer: >60 mL/min (ref >60)
Glucose, Bld: 113 mg/dL — ABNORMAL HIGH (ref 65–99)
Potassium: 3.6 mmol/L (ref 3.5–5.1)
Sodium: 135 mmol/L (ref 135–145)
Total Bilirubin: 0.8 mg/dL (ref 0.3–1.2)
Total Protein: 7.4 g/dL (ref 6.5–8.1)

## 2015-12-21 LAB — TSH: TSH: 67.643 u[IU]/mL — ABNORMAL HIGH (ref 0.350–4.500)

## 2015-12-21 MED ORDER — LEVOTHYROXINE SODIUM 112 MCG PO TABS: 112.0000 ug | ORAL_TABLET | Freq: Every day | ORAL | 6 refills | Status: DC

## 2015-12-21 NOTE — Progress Notes (Signed)
Patient is here for follow up, he has no complaints today

## 2015-12-21 NOTE — Progress Notes (Addendum)
Benjamin Torres is a 60 y.o. 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  HPI:  Benjamin patient presented with carcinoma of larynx involving epiglottis in 2015. He presented with a 6 month history of sore throat and a several month history of increasing swelling mostly on Benjamin right side. He was seen by Dr. Tami Ribas of ENT.  He underwent microlaryngoscopy with endoscopic biopsy of a lesion of Benjamin epiglottic laryngeal surface.  Examination revealed an ulcerative lesion on Benjamin right side of epiglottis. 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   PET scan on 08/18/2013 revealed hypermetabolic soft tissue lesion in Benjamin right hypopharynx consistent with epiglottic lesion. There also was a high right paratracheal lymph node which was also hypermetabolic and suspicious for metastatic disease.  Clinical stage was T2N1M0.  He was treated with radiation therapy and cetuximab beginning 09/07/2013.  He received 7,000 cGy from 09/08/2015 - 11/10/2013.  He states that during treatment he lost his voice.  Benjamin patient was last seen by Dr. Oliva Bustard on 05/30/2015.  At that time, there was no evidence of recurrent disease.  An appointment with ENT was scheduled.  He states that he saw Dr. Tami Ribas 1-2 months ago and was told that he was "cancer free".   He has a follow-up with him in 03/2016.  PET scan on 06/21/2015 revealed no evidence of recurrent disease.  He has been seeing Robb Matar, speech and language pathologist, 08/25/2015 - 09/26/2015 for dysphonia.  Symptomatically, he feels "great". He denies any pain. His weight has improved back to baseline (190-200 pounds).  He denies any issues.  He notes that he was previously taking Synthroid but ran out a few months ago.   Past Medical History:  Diagnosis Date  . 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  No family history on file.  Social History:  reports that he has quit smoking. He does not have any smokeless tobacco history on file. His alcohol and drug histories are not on file.  He likes to ride motorcycles.  He likes to play soft ball.  He works a Armed forces logistics/support/administrative officer.  Benjamin patient is alone today.  Allergies: No Known Allergies  Current Medications: Current Outpatient Prescriptions  Medication Sig Dispense Refill  . omeprazole (PRILOSEC) 20 MG capsule Take 20 mg by mouth daily.    Marland Kitchen levothyroxine (SYNTHROID) 112 MCG tablet Take 1 tablet (112 mcg total) by mouth daily before breakfast. 30 tablet 6  . lisinopril (PRINIVIL,ZESTRIL) 10 MG tablet TAKE ONE (1) TABLET BY MOUTH EVERY DAY (Patient not taking: Reported on 12/21/2015) 30 tablet 0   No current facility-administered medications for this visit.     Review of Systems:  GENERAL:  Feels good.  Active.  No fevers, sweats or weight loss. PERFORMANCE STATUS (ECOG):  0 HEENT:  No visual changes, runny nose, sore throat, mouth sores or tenderness. Lungs: No shortness of breath or cough.  No hemoptysis. Cardiac:  No chest pain, palpitations, orthopnea, or PND. GI:  No nausea, vomiting, diarrhea, constipation, melena or hematochezia. GU:  No urgency, frequency, dysuria, or hematuria. Musculoskeletal:  No back pain.  No joint pain.  No muscle tenderness. Extremities:  No pain or swelling. Skin:  No rashes or skin changes. Neuro:  No headache, numbness or weakness, balance or coordination issues. Endocrine:  No diabetes.  Hypothyroid off Synthroid (ran out).  No hot flashes or night sweats. Psych:  No mood changes, depression or anxiety. Pain:  No focal pain. Review of systems:  All other systems reviewed and found to be negative.  Physical Exam: Blood pressure (!) 162/94, pulse 67, temperature (!) 95 F (35 C), temperature source Tympanic, resp. rate 18, weight 184 lb 13.7 oz (83.8 kg). GENERAL:  Well  developed, well nourished, sitting comfortably in Benjamin exam room in no acute distress. MENTAL STATUS:  Alert and oriented to person, place and time. HEAD:  Pearline Cables thin hair.  Long gray mustache.  Normocephalic, atraumatic, face symmetric, no Cushingoid features. EYES:  Pupils equal round and reactive to light and accomodation.  No conjunctivitis or scleral icterus. ENT:  Slightly hoarse voice.  Oropharynx clear without lesion.  Missing several teeth.  Tongue normal. Mucous membranes moist.  RESPIRATORY:  Clear to auscultation without rales, wheezes or rhonchi. CARDIOVASCULAR:  Regular rate and rhythm without murmur, rub or gallop. ABDOMEN:  Soft, non-tender, with active bowel sounds, and no hepatosplenomegaly.  No masses. SKIN:  Tattoo right arm.  No rashes, ulcers or lesions. EXTREMITIES: No edema, no skin discoloration or tenderness.  No palpable cords. LYMPH NODES: No palpable cervical, supraclavicular, axillary or inguinal adenopathy  NEUROLOGICAL: Unremarkable. PSYCH:  Appropriate.   Appointment on 12/21/2015  Component Date Value Ref Range Status  . WBC 12/21/2015 5.7  3.8 - 10.6 K/uL Final  . RBC 12/21/2015 5.10  4.40 - 5.90 MIL/uL Final  . Hemoglobin 12/21/2015 15.0  13.0 - 18.0 g/dL Final  . HCT 12/21/2015 44.2  40.0 - 52.0 % Final  . MCV 12/21/2015 86.6  80.0 - 100.0 fL Final  . MCH 12/21/2015 29.5  26.0 - 34.0 pg Final  . MCHC 12/21/2015 34.1  32.0 - 36.0 g/dL Final  . RDW 12/21/2015 14.5  11.5 - 14.5 % Final  . Platelets 12/21/2015 182  150 - 440 K/uL Final  . Neutrophils Relative % 12/21/2015 70  % Final  . Neutro Abs 12/21/2015 4.0  1.4 - 6.5 K/uL Final  . Lymphocytes Relative 12/21/2015 19  % Final  . Lymphs Abs 12/21/2015 1.1  1.0 - 3.6 K/uL Final  . Monocytes Relative 12/21/2015 8  % Final  . Monocytes Absolute 12/21/2015 0.5  0.2 - 1.0 K/uL Final  . Eosinophils Relative 12/21/2015 2  % Final  . Eosinophils Absolute 12/21/2015 0.1  0 - 0.7 K/uL Final  . Basophils  Relative 12/21/2015 1  % Final  . Basophils Absolute 12/21/2015 0.0  0 - 0.1 K/uL Final  . Sodium 12/21/2015 135  135 - 145 mmol/L Final  . Potassium 12/21/2015 3.6  3.5 - 5.1 mmol/L Final  . Chloride 12/21/2015 106  101 - 111 mmol/L Final  . CO2 12/21/2015 24  22 - 32 mmol/L Final  . Glucose, Bld 12/21/2015 113* 65 - 99 mg/dL Final  . BUN 12/21/2015 14  6 - 20 mg/dL Final  . Creatinine, Ser 12/21/2015 0.74  0.61 - 1.24 mg/dL Final  . Calcium 12/21/2015 8.5* 8.9 - 10.3 mg/dL Final  . Total Protein 12/21/2015 7.4  6.5 - 8.1 g/dL Final  . Albumin 12/21/2015 4.2  3.5 - 5.0 g/dL Final  . AST 12/21/2015 21  15 - 41 U/L Final  . ALT 12/21/2015 14* 17 - 63 U/L Final  . Alkaline Phosphatase 12/21/2015 44  38 - 126 U/L Final  . Total Bilirubin 12/21/2015 0.8  0.3 - 1.2 mg/dL Final  . GFR  calc non Af Amer 12/21/2015 >60  >60 mL/min Final  . GFR calc Af Amer 12/21/2015 >60  >60 mL/min Final   Comment: (NOTE) Benjamin eGFR has been calculated using Benjamin CKD EPI equation. This calculation has not been validated in all clinical situations. eGFR's persistently <60 mL/min signify possible Chronic Kidney Disease.   . Anion gap 12/21/2015 5  5 - 15 Final  . T4, Total 12/22/2015 3.4* 4.5 - 12.0 ug/dL Final   Comment: (NOTE) Performed At: [facility] [place], [place] [id number] Lindon Romp MD [id number]   . TSH 12/21/2015 67.643* 0.350 - 4.500 uIU/mL Final    Assessment:  JAQWAN WIEBER is a 60 y.o. male with stage III supraglottic larynx cancer s/p concurrent cetuximab and radiation.  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 PET scan on 08/18/2013 revealed hypermetabolic soft tissue lesion in Benjamin right hypopharynx consistent with epiglottic lesion. There also was a high right paratracheal lymph node which was also hypermetabolic and suspicious for metastatic disease.  Clinical stage was T2N1M0.  He was treated with radiation therapy and  cetuximab beginning 09/07/2013.  He received 7,000 cGy from 09/07/2013 - 11/10/2013.    PET scan on 06/21/2015 revealed no evidence of recurrent disease.  ENT evaluation 1-2 months ago was negative per patient.  Symptomatically, he feels great. He denies any pain. His weight has improved.  He ran out of his Synthroid a few months ago.  Plan: 1.  Review entire medical history, diagnosis and management of larynx cancer. We discussed his treatment of dysphonia with Robb Matar.  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 Prior prescriptions filled by Dr. Metro Kung were reviewed.  He has been on a relatively stable dose. Medication will be refilled. We discussed follow-up of his thyroid function tests in approximately 3 months.  2.  We discussed general follow-up.  He should have ENT evaluation every 4-8 months as he is in his third year of follow-up. Once he is more than 5 years post therapy, he will have a evaluation every 12 months. We discussed a follow-up TSH every 6 months but a repeat in 3 months secondary to reinitiation of Synthroid. 3.  Refill Synthroid 112 mcg po q day. 4.  Nurse to call with thyroid results 5.  RTC in 3 months for labs (TSH, free T4) 6.  RTC in 6 months for MD assessment and labs (CBC with diff, CMP, TSH, free T4).   Lequita Asal, MD  12/21/2015, 11:10 AM

## 2015-12-22 LAB — T4: T4, Total: 3.4 ug/dL — ABNORMAL LOW (ref 4.5–12.0)

## 2015-12-23 ENCOUNTER — Encounter: Payer: Self-pay | Admitting: Hematology and Oncology

## 2016-03-14 ENCOUNTER — Inpatient Hospital Stay: Payer: BLUE CROSS/BLUE SHIELD | Attending: Hematology and Oncology

## 2016-03-14 DIAGNOSIS — Z923 Personal history of irradiation: Secondary | ICD-10-CM | POA: Insufficient documentation

## 2016-03-14 DIAGNOSIS — Z8521 Personal history of malignant neoplasm of larynx: Secondary | ICD-10-CM | POA: Insufficient documentation

## 2016-03-14 DIAGNOSIS — C159 Malignant neoplasm of esophagus, unspecified: Secondary | ICD-10-CM

## 2016-03-14 LAB — T4, FREE: Free T4: 1.06 ng/dL (ref 0.61–1.12)

## 2016-03-14 LAB — TSH: TSH: 1.672 u[IU]/mL (ref 0.350–4.500)

## 2016-05-19 ENCOUNTER — Other Ambulatory Visit: Payer: Self-pay | Admitting: Hematology and Oncology

## 2016-05-19 DIAGNOSIS — C159 Malignant neoplasm of esophagus, unspecified: Secondary | ICD-10-CM

## 2016-06-20 ENCOUNTER — Inpatient Hospital Stay: Payer: BLUE CROSS/BLUE SHIELD | Attending: Hematology and Oncology

## 2016-06-20 ENCOUNTER — Encounter: Payer: Self-pay | Admitting: Hematology and Oncology

## 2016-06-20 ENCOUNTER — Inpatient Hospital Stay (HOSPITAL_BASED_OUTPATIENT_CLINIC_OR_DEPARTMENT_OTHER): Payer: BLUE CROSS/BLUE SHIELD | Admitting: Hematology and Oncology

## 2016-06-20 VITALS — BP 121/76 | HR 54 | Temp 98.4°F | Wt 176.4 lb

## 2016-06-20 DIAGNOSIS — Z923 Personal history of irradiation: Secondary | ICD-10-CM | POA: Insufficient documentation

## 2016-06-20 DIAGNOSIS — Z9221 Personal history of antineoplastic chemotherapy: Secondary | ICD-10-CM | POA: Insufficient documentation

## 2016-06-20 DIAGNOSIS — Z79899 Other long term (current) drug therapy: Secondary | ICD-10-CM | POA: Diagnosis not present

## 2016-06-20 DIAGNOSIS — Y842 Radiological procedure and radiotherapy as the cause of abnormal reaction of the patient, or of later complication, without mention of misadventure at the time of the procedure: Secondary | ICD-10-CM | POA: Insufficient documentation

## 2016-06-20 DIAGNOSIS — E039 Hypothyroidism, unspecified: Secondary | ICD-10-CM | POA: Diagnosis not present

## 2016-06-20 DIAGNOSIS — K117 Disturbances of salivary secretion: Secondary | ICD-10-CM

## 2016-06-20 DIAGNOSIS — C329 Malignant neoplasm of larynx, unspecified: Secondary | ICD-10-CM

## 2016-06-20 DIAGNOSIS — E038 Other specified hypothyroidism: Secondary | ICD-10-CM

## 2016-06-20 DIAGNOSIS — Z87891 Personal history of nicotine dependence: Secondary | ICD-10-CM

## 2016-06-20 DIAGNOSIS — R634 Abnormal weight loss: Secondary | ICD-10-CM

## 2016-06-20 DIAGNOSIS — C159 Malignant neoplasm of esophagus, unspecified: Secondary | ICD-10-CM

## 2016-06-20 DIAGNOSIS — Z8521 Personal history of malignant neoplasm of larynx: Secondary | ICD-10-CM | POA: Insufficient documentation

## 2016-06-20 LAB — CBC WITH DIFFERENTIAL/PLATELET
Basophils Absolute: 0 10*3/uL (ref 0–0.1)
Basophils Relative: 1 %
Eosinophils Absolute: 0.1 10*3/uL (ref 0–0.7)
Eosinophils Relative: 2 %
HCT: 42.2 % (ref 40.0–52.0)
Hemoglobin: 14.3 g/dL (ref 13.0–18.0)
Lymphocytes Relative: 21 %
Lymphs Abs: 1.5 10*3/uL (ref 1.0–3.6)
MCH: 29.3 pg (ref 26.0–34.0)
MCHC: 34 g/dL (ref 32.0–36.0)
MCV: 86.4 fL (ref 80.0–100.0)
Monocytes Absolute: 0.6 10*3/uL (ref 0.2–1.0)
Monocytes Relative: 9 %
Neutro Abs: 4.8 10*3/uL (ref 1.4–6.5)
Neutrophils Relative %: 67 %
Platelets: 205 10*3/uL (ref 150–440)
RBC: 4.88 MIL/uL (ref 4.40–5.90)
RDW: 14 % (ref 11.5–14.5)
WBC: 7 10*3/uL (ref 3.8–10.6)

## 2016-06-20 LAB — COMPREHENSIVE METABOLIC PANEL
ALT: 14 U/L — ABNORMAL LOW (ref 17–63)
AST: 19 U/L (ref 15–41)
Albumin: 4.2 g/dL (ref 3.5–5.0)
Alkaline Phosphatase: 52 U/L (ref 38–126)
Anion gap: 7 (ref 5–15)
BUN: 16 mg/dL (ref 6–20)
CO2: 23 mmol/L (ref 22–32)
Calcium: 8.9 mg/dL (ref 8.9–10.3)
Chloride: 107 mmol/L (ref 101–111)
Creatinine, Ser: 0.73 mg/dL (ref 0.61–1.24)
GFR calc Af Amer: >60 mL/min (ref >60)
GFR calc non Af Amer: >60 mL/min (ref >60)
Glucose, Bld: 97 mg/dL (ref 65–99)
Potassium: 4.1 mmol/L (ref 3.5–5.1)
Sodium: 137 mmol/L (ref 135–145)
Total Bilirubin: 0.6 mg/dL (ref 0.3–1.2)
Total Protein: 7.1 g/dL (ref 6.5–8.1)

## 2016-06-20 LAB — TSH: TSH: 4.227 u[IU]/mL (ref 0.350–4.500)

## 2016-06-20 LAB — T4, FREE: Free T4: 1 ng/dL (ref 0.61–1.12)

## 2016-06-20 NOTE — Progress Notes (Signed)
Patient states it has been 3 years since he smoked.

## 2016-06-20 NOTE — Progress Notes (Signed)
[facility] day:  06/20/2016   Chief Complaint: Benjamin Torres is a 61 y.o. 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  HPI:  The patient was last seen in the medical oncology clinic on 12/21/2015.  At that time, he was seen for initial assessment by me.  He felt great. He denied any pain. His weight had improved.  He had ran out of his Synthroid a few months ago. Synthroid was refilled.  Labs on 12/21/2015 CBC with differential and CMP. TSH was 67.643 (high). T4 was 3.4 (low).  TSH was 1.672 with a free T4 of 1.06 (normal) on 03/14/2016.  During the interim,he has done well.  He saw Dr. Tami Ribas on 04/19/2016.  Endoscopy was negative. He has a 6 month follow-up.  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   Past Medical History:  Diagnosis Date  . Cancer of larynx ([place]) 11/06/2014    History reviewed. No pertinent surgical history.  History reviewed. No pertinent family history.  Social History:  reports that he has quit smoking. He does not have any smokeless tobacco history on file. His alcohol and drug histories are not on file.  He likes to ride motorcycles.  He likes to play soft ball.  He works a Armed forces logistics/support/administrative officer.  The patient is alone today.  Allergies: No Known Allergies  Current Medications: Current Outpatient Prescriptions  Medication Sig Dispense Refill  . levothyroxine (SYNTHROID, LEVOTHROID) 112 MCG tablet TAKE 1 TABLET (112 MCG TOTAL) BY MOUTH DAILY BEFORE BREAKFAST. 30 tablet 2  . omeprazole (PRILOSEC) 20 MG capsule Take 20 mg by mouth daily.    Marland Kitchen lisinopril (PRINIVIL,ZESTRIL) 10 MG tablet TAKE ONE (1) TABLET BY MOUTH EVERY DAY (Patient not taking: Reported on 12/21/2015) 30 tablet 0   No current facility-administered medications for this visit.     Review of Systems:  GENERAL:  Feels good.  No fevers or sweats.  Weight down 6 pounds. PERFORMANCE STATUS (ECOG):   0 HEENT:  Dry mouth.  No visual changes, runny nose, sore throat, mouth sores or tenderness. Lungs: No shortness of breath or cough.  No hemoptysis. Cardiac:  No chest pain, palpitations, orthopnea, or PND. GI:  No nausea, vomiting, diarrhea, constipation, melena or hematochezia. GU:  No urgency, frequency, dysuria, or hematuria. Musculoskeletal:  No back pain.  No joint pain.  No muscle tenderness. Extremities:  No pain or swelling. Skin:  No rashes or skin changes. Neuro:  No headache, numbness or weakness, balance or coordination issues. Endocrine:  No diabetes.  Hypothyroid off Synthroid (ran out).  No hot flashes or night sweats. Psych:  No mood changes, depression or anxiety. Pain:  No focal pain. Review of systems:  All other systems reviewed and found to be negative.  Physical Exam: Blood pressure 121/76, pulse (!) 54, temperature 98.4 F (36.9 C), temperature source Tympanic, weight 176 lb 5.9 oz (80 kg). GENERAL:  Well developed, well nourished, sitting comfortably in the exam room in no acute distress. MENTAL STATUS:  Alert and oriented to person, place and time. HEAD:  Wearing a blue cap.  Gray thin hair.  Long gray mustache.  Normocephalic, atraumatic, face symmetric, no Cushingoid features. EYES:  Pupils equal round and reactive to light and accomodation.  No conjunctivitis or scleral icterus. ENT:  Oropharynx clear without lesion.  Missing several teeth.  Tongue normal. Mucous membranes slightly moist.  RESPIRATORY:  Clear to auscultation  without rales, wheezes or rhonchi. CARDIOVASCULAR:  Regular rate and rhythm without murmur, rub or gallop. ABDOMEN:  Soft, non-tender, with active bowel sounds, and no hepatosplenomegaly.  No masses. SKIN:  Tattoo right arm.  No rashes, ulcers or lesions. EXTREMITIES: No edema, no skin discoloration or tenderness.  No palpable cords. LYMPH NODES: No palpable cervical, supraclavicular, axillary or inguinal adenopathy  NEUROLOGICAL:  Unremarkable. PSYCH:  Appropriate.   Appointment on 06/20/2016  Component Date Value Ref Range Status  . WBC 06/20/2016 7.0  3.8 - 10.6 K/uL Final  . RBC 06/20/2016 4.88  4.40 - 5.90 MIL/uL Final  . Hemoglobin 06/20/2016 14.3  13.0 - 18.0 g/dL Final  . HCT 06/20/2016 42.2  40.0 - 52.0 % Final  . MCV 06/20/2016 86.4  80.0 - 100.0 fL Final  . MCH 06/20/2016 29.3  26.0 - 34.0 pg Final  . MCHC 06/20/2016 34.0  32.0 - 36.0 g/dL Final  . RDW 06/20/2016 14.0  11.5 - 14.5 % Final  . Platelets 06/20/2016 205  150 - 440 K/uL Final  . Neutrophils Relative % 06/20/2016 67  % Final  . Neutro Abs 06/20/2016 4.8  1.4 - 6.5 K/uL Final  . Lymphocytes Relative 06/20/2016 21  % Final  . Lymphs Abs 06/20/2016 1.5  1.0 - 3.6 K/uL Final  . Monocytes Relative 06/20/2016 9  % Final  . Monocytes Absolute 06/20/2016 0.6  0.2 - 1.0 K/uL Final  . Eosinophils Relative 06/20/2016 2  % Final  . Eosinophils Absolute 06/20/2016 0.1  0 - 0.7 K/uL Final  . Basophils Relative 06/20/2016 1  % Final  . Basophils Absolute 06/20/2016 0.0  0 - 0.1 K/uL Final  . Sodium 06/20/2016 137  135 - 145 mmol/L Final  . Potassium 06/20/2016 4.1  3.5 - 5.1 mmol/L Final  . Chloride 06/20/2016 107  101 - 111 mmol/L Final  . CO2 06/20/2016 23  22 - 32 mmol/L Final  . Glucose, Bld 06/20/2016 97  65 - 99 mg/dL Final  . BUN 06/20/2016 16  6 - 20 mg/dL Final  . Creatinine, Ser 06/20/2016 0.73  0.61 - 1.24 mg/dL Final  . Calcium 06/20/2016 8.9  8.9 - 10.3 mg/dL Final  . Total Protein 06/20/2016 7.1  6.5 - 8.1 g/dL Final  . Albumin 06/20/2016 4.2  3.5 - 5.0 g/dL Final  . AST 06/20/2016 19  15 - 41 U/L Final  . ALT 06/20/2016 14* 17 - 63 U/L Final  . Alkaline Phosphatase 06/20/2016 52  38 - 126 U/L Final  . Total Bilirubin 06/20/2016 0.6  0.3 - 1.2 mg/dL Final  . GFR calc non Af Amer 06/20/2016 >60  >60 mL/min Final  . GFR calc Af Amer 06/20/2016 >60  >60 mL/min Final   Comment: (NOTE) The eGFR has been calculated using the CKD EPI  equation. This calculation has not been validated in all clinical situations. eGFR's persistently <60 mL/min signify possible Chronic Kidney Disease.   Georgiann Hahn gap 06/20/2016 7  5 - 15 Final    Assessment:  Benjamin Torres is a 61 y.o. male with stage III supraglottic larynx cancer s/p concurrent cetuximab and radiation.  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 PET scan on 08/18/2013 revealed hypermetabolic soft tissue lesion in the right hypopharynx consistent with epiglottic lesion. There also was a high right paratracheal lymph node which was also hypermetabolic and suspicious for metastatic disease.  Clinical stage was T2N1M0.  He was treated with radiation therapy and cetuximab beginning  09/07/2013.  He received 7,000 cGy from 09/07/2013 - 11/10/2013.    PET scan on 06/21/2015 revealed no evidence of recurrent disease.  ENT evaluation in 03/2016 was negative per patient.  He has hypothyroidism.  He is on Synthroid.  Symptomatically, he feels great.  Weight is down.  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  Thyroid function is normal on supplementation.  Plan: 1.  Labs today:  CBC with diff, CMP, TSH, free T4. 2.  Continue general follow-up.  He should have ENT evaluation every 4-8 months as he is in his third year of follow-up. Once he is more than 5 years post therapy, he will have a evaluation every 12 months.  Check TSH every 6 months. 3.  Continue Synthroid 112 mcg po q day. 4.  Inquire about coverage for Salivamax. 5.  RTC in 6 months for MD assessment and labs (CBC with diff, CMP, TSH, free T4).   Lequita Asal, MD  06/20/2016, 3:09 PM

## 2016-06-22 ENCOUNTER — Encounter: Payer: Self-pay | Admitting: Hematology and Oncology

## 2016-06-22 DIAGNOSIS — E038 Other specified hypothyroidism: Secondary | ICD-10-CM | POA: Insufficient documentation

## 2016-06-22 DIAGNOSIS — E039 Hypothyroidism, unspecified: Secondary | ICD-10-CM | POA: Insufficient documentation

## 2016-07-17 ENCOUNTER — Telehealth: Payer: Self-pay | Admitting: *Deleted

## 2016-07-17 NOTE — Telephone Encounter (Signed)
Called patients wife and she reported that she didn't even know if the patient had used the saliva max at this point but that he did have the samples.  Instructed to tell the patient that if he liked it and it worked for him to call us at the clinic if he could not afford it as it was not covered under his insurance. Voiced understanding.

## 2016-10-07 ENCOUNTER — Other Ambulatory Visit: Payer: Self-pay | Admitting: *Deleted

## 2016-10-07 DIAGNOSIS — C159 Malignant neoplasm of esophagus, unspecified: Secondary | ICD-10-CM

## 2016-10-07 MED ORDER — LEVOTHYROXINE SODIUM 112 MCG PO TABS: 112.0000 ug | ORAL_TABLET | Freq: Every day | ORAL | 2 refills | Status: DC

## 2016-11-01 ENCOUNTER — Other Ambulatory Visit: Payer: Self-pay | Admitting: Hematology and Oncology

## 2016-11-01 DIAGNOSIS — C159 Malignant neoplasm of esophagus, unspecified: Secondary | ICD-10-CM

## 2016-11-13 ENCOUNTER — Other Ambulatory Visit: Payer: Self-pay | Admitting: *Deleted

## 2016-11-13 DIAGNOSIS — C159 Malignant neoplasm of esophagus, unspecified: Secondary | ICD-10-CM

## 2016-11-13 MED ORDER — LEVOTHYROXINE SODIUM 112 MCG PO TABS: 112.0000 ug | ORAL_TABLET | Freq: Every day | ORAL | 1 refills | Status: DC

## 2016-12-19 ENCOUNTER — Inpatient Hospital Stay: Payer: BLUE CROSS/BLUE SHIELD | Admitting: Hematology and Oncology

## 2016-12-19 ENCOUNTER — Inpatient Hospital Stay: Payer: BLUE CROSS/BLUE SHIELD

## 2017-01-13 ENCOUNTER — Inpatient Hospital Stay: Payer: BLUE CROSS/BLUE SHIELD | Attending: Hematology and Oncology

## 2017-01-13 ENCOUNTER — Inpatient Hospital Stay (HOSPITAL_BASED_OUTPATIENT_CLINIC_OR_DEPARTMENT_OTHER): Payer: BLUE CROSS/BLUE SHIELD | Admitting: Hematology and Oncology

## 2017-01-13 VITALS — BP 105/69 | HR 72 | Temp 97.6°F | Resp 18 | Wt 155.3 lb

## 2017-01-13 DIAGNOSIS — R634 Abnormal weight loss: Secondary | ICD-10-CM

## 2017-01-13 DIAGNOSIS — Z87891 Personal history of nicotine dependence: Secondary | ICD-10-CM | POA: Insufficient documentation

## 2017-01-13 DIAGNOSIS — Z9221 Personal history of antineoplastic chemotherapy: Secondary | ICD-10-CM

## 2017-01-13 DIAGNOSIS — Z923 Personal history of irradiation: Secondary | ICD-10-CM | POA: Diagnosis not present

## 2017-01-13 DIAGNOSIS — K117 Disturbances of salivary secretion: Secondary | ICD-10-CM | POA: Diagnosis not present

## 2017-01-13 DIAGNOSIS — C329 Malignant neoplasm of larynx, unspecified: Secondary | ICD-10-CM

## 2017-01-13 DIAGNOSIS — Z125 Encounter for screening for malignant neoplasm of prostate: Secondary | ICD-10-CM

## 2017-01-13 DIAGNOSIS — Z79899 Other long term (current) drug therapy: Secondary | ICD-10-CM | POA: Insufficient documentation

## 2017-01-13 DIAGNOSIS — E039 Hypothyroidism, unspecified: Secondary | ICD-10-CM

## 2017-01-13 DIAGNOSIS — Z8521 Personal history of malignant neoplasm of larynx: Secondary | ICD-10-CM | POA: Diagnosis present

## 2017-01-13 DIAGNOSIS — Y842 Radiological procedure and radiotherapy as the cause of abnormal reaction of the patient, or of later complication, without mention of misadventure at the time of the procedure: Secondary | ICD-10-CM

## 2017-01-13 DIAGNOSIS — E038 Other specified hypothyroidism: Secondary | ICD-10-CM

## 2017-01-13 LAB — CBC WITH DIFFERENTIAL/PLATELET
Basophils Absolute: 0 10*3/uL (ref 0–0.1)
Basophils Relative: 1 %
Eosinophils Absolute: 0.1 10*3/uL (ref 0–0.7)
Eosinophils Relative: 2 %
HCT: 42.5 % (ref 40.0–52.0)
Hemoglobin: 14.5 g/dL (ref 13.0–18.0)
Lymphocytes Relative: 18 %
Lymphs Abs: 1.3 10*3/uL (ref 1.0–3.6)
MCH: 29.7 pg (ref 26.0–34.0)
MCHC: 34.2 g/dL (ref 32.0–36.0)
MCV: 86.9 fL (ref 80.0–100.0)
Monocytes Absolute: 0.6 10*3/uL (ref 0.2–1.0)
Monocytes Relative: 9 %
Neutro Abs: 5 10*3/uL (ref 1.4–6.5)
Neutrophils Relative %: 70 %
Platelets: 220 10*3/uL (ref 150–440)
RBC: 4.89 MIL/uL (ref 4.40–5.90)
RDW: 14.2 % (ref 11.5–14.5)
WBC: 7.1 10*3/uL (ref 3.8–10.6)

## 2017-01-13 LAB — PSA: Prostatic Specific Antigen: 1.91 ng/mL (ref 0.00–4.00)

## 2017-01-13 LAB — COMPREHENSIVE METABOLIC PANEL WITH GFR
ALT: 11 U/L — ABNORMAL LOW (ref 17–63)
AST: 18 U/L (ref 15–41)
Albumin: 4.1 g/dL (ref 3.5–5.0)
Alkaline Phosphatase: 59 U/L (ref 38–126)
Anion gap: 8 (ref 5–15)
BUN: 12 mg/dL (ref 6–20)
CO2: 26 mmol/L (ref 22–32)
Calcium: 9 mg/dL (ref 8.9–10.3)
Chloride: 103 mmol/L (ref 101–111)
Creatinine, Ser: 0.74 mg/dL (ref 0.61–1.24)
GFR calc Af Amer: >60 mL/min
GFR calc non Af Amer: >60 mL/min
Glucose, Bld: 92 mg/dL (ref 65–99)
Potassium: 4.1 mmol/L (ref 3.5–5.1)
Sodium: 137 mmol/L (ref 135–145)
Total Bilirubin: 0.5 mg/dL (ref 0.3–1.2)
Total Protein: 6.9 g/dL (ref 6.5–8.1)

## 2017-01-13 LAB — T4, FREE: Free T4: 0.87 ng/dL (ref 0.61–1.12)

## 2017-01-13 LAB — TSH: TSH: 1.401 u[IU]/mL (ref 0.350–4.500)

## 2017-01-13 NOTE — Progress Notes (Signed)
Patient states he is SOB due to COPD.  Also concerned with his weight loss.  Patient has lost 21# since his last visit in February.

## 2017-01-13 NOTE — Progress Notes (Signed)
[facility] day:  01/13/2017  Chief Complaint: Benjamin Torres is a 61 y.o. 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  HPI:  The patient was last seen in the medical oncology clinic on 06/20/2016.  At that time, he felt great.  Weight was down.  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  Thyroid function was normal on supplementation.  During the interim, he has lost 21 pounds.  He denies any nausea, vomiting or diarrhea.  He denies any pain.  He is eating well.  He got Salivamax, but hasn't used it.  He states that he saw Dr. Tami Ribas last month and everything was "ok".  He denies any other symptoms.  He notes no prior colonoscopy or no colonoscopy > 20 years.  He is unsure of his PSA.   Past Medical History:  Diagnosis Date  . Cancer of larynx ([place]) 11/06/2014    No past surgical history on file.  No family history on file.  Social History:  reports that he has quit smoking. He has never used smokeless tobacco. His alcohol and drug histories are not on file.  He likes to ride motorcycles.  He likes to play soft ball.  He works a Armed forces logistics/support/administrative officer.  The patient is accompanied by his wife, Benjamin Torres, today.  Allergies: No Known Allergies  Current Medications: Current Outpatient Prescriptions  Medication Sig Dispense Refill  . levothyroxine (SYNTHROID, LEVOTHROID) 112 MCG tablet Take 1 tablet (112 mcg total) by mouth daily before breakfast. 30 tablet 1  . omeprazole (PRILOSEC) 20 MG capsule Take 20 mg by mouth daily.    Marland Kitchen lisinopril (PRINIVIL,ZESTRIL) 10 MG tablet TAKE ONE (1) TABLET BY MOUTH EVERY DAY (Patient not taking: Reported on 12/21/2015) 30 tablet 0   No current facility-administered medications for this visit.     Review of Systems:  GENERAL:  Feels good.  No fevers or sweats.  Weight down 21 pounds. PERFORMANCE STATUS (ECOG):  0 HEENT:  Dry mouth (not using Salivamax).  No visual changes,  runny nose, sore throat, mouth sores or tenderness. Lungs: No shortness of breath or cough.  No hemoptysis. Cardiac:  No chest pain, palpitations, orthopnea, or PND. GI:  Eating well.  No nausea, vomiting, diarrhea, constipation, melena or hematochezia. GU:  No urgency, frequency, dysuria, or hematuria. Musculoskeletal:  No back pain.  No joint pain.  No muscle tenderness. Extremities:  No pain or swelling. Skin:  No rashes or skin changes. Neuro:  No headache, numbness or weakness, balance or coordination issues. Endocrine:  No diabetes.  Hypothyroid on Synthroid.  No hot flashes or night sweats. Psych:  No mood changes, depression or anxiety. Pain:  No focal pain. Review of systems:  All other systems reviewed and found to be negative.  Physical Exam: Blood pressure 105/69, pulse 72, temperature 97.6 F (36.4 C), resp. rate 18, weight 155 lb 5 oz (70.4 kg). GENERAL:  Well developed, well nourished, sitting comfortably in the exam room in no acute distress. MENTAL STATUS:  Alert and oriented to person, place and time. HEAD:  Alopecia.  Goatee.  Normocephalic, atraumatic, face symmetric, no Cushingoid features. EYES:  Brown eyes.  Pupils equal round and reactive to light and accomodation.  No conjunctivitis or scleral icterus. ENT:  Oropharynx clear without lesion.  Missing several teeth.  Tongue normal. Mucous membranes slightly moist.  RESPIRATORY:  Clear to auscultation without rales, wheezes or rhonchi. CARDIOVASCULAR:  Regular rate and rhythm without murmur, rub or gallop. ABDOMEN:  Soft, non-tender, with active bowel sounds, and no hepatosplenomegaly.  No masses. SKIN:  Tattoo right arm.  No rashes, ulcers or lesions. EXTREMITIES: No edema, no skin discoloration or tenderness.  No palpable cords. LYMPH NODES: No palpable cervical, supraclavicular, axillary or inguinal adenopathy  NEUROLOGICAL: Unremarkable. PSYCH:  Appropriate.   Office Visit on 01/13/2017  Component Date Value  Ref Range Status  . WBC 01/13/2017 7.1  3.8 - 10.6 K/uL Final  . RBC 01/13/2017 4.89  4.40 - 5.90 MIL/uL Final  . Hemoglobin 01/13/2017 14.5  13.0 - 18.0 g/dL Final  . HCT 01/13/2017 42.5  40.0 - 52.0 % Final  . MCV 01/13/2017 86.9  80.0 - 100.0 fL Final  . MCH 01/13/2017 29.7  26.0 - 34.0 pg Final  . MCHC 01/13/2017 34.2  32.0 - 36.0 g/dL Final  . RDW 01/13/2017 14.2  11.5 - 14.5 % Final  . Platelets 01/13/2017 220  150 - 440 K/uL Final  . Neutrophils Relative % 01/13/2017 70  % Final  . Neutro Abs 01/13/2017 5.0  1.4 - 6.5 K/uL Final  . Lymphocytes Relative 01/13/2017 18  % Final  . Lymphs Abs 01/13/2017 1.3  1.0 - 3.6 K/uL Final  . Monocytes Relative 01/13/2017 9  % Final  . Monocytes Absolute 01/13/2017 0.6  0.2 - 1.0 K/uL Final  . Eosinophils Relative 01/13/2017 2  % Final  . Eosinophils Absolute 01/13/2017 0.1  0 - 0.7 K/uL Final  . Basophils Relative 01/13/2017 1  % Final  . Basophils Absolute 01/13/2017 0.0  0 - 0.1 K/uL Final  . Sodium 01/13/2017 137  135 - 145 mmol/L Final  . Potassium 01/13/2017 4.1  3.5 - 5.1 mmol/L Final  . Chloride 01/13/2017 103  101 - 111 mmol/L Final  . CO2 01/13/2017 26  22 - 32 mmol/L Final  . Glucose, Bld 01/13/2017 92  65 - 99 mg/dL Final  . BUN 01/13/2017 12  6 - 20 mg/dL Final  . Creatinine, Ser 01/13/2017 0.74  0.61 - 1.24 mg/dL Final  . Calcium 01/13/2017 9.0  8.9 - 10.3 mg/dL Final  . Total Protein 01/13/2017 6.9  6.5 - 8.1 g/dL Final  . Albumin 01/13/2017 4.1  3.5 - 5.0 g/dL Final  . AST 01/13/2017 18  15 - 41 U/L Final  . ALT 01/13/2017 11* 17 - 63 U/L Final  . Alkaline Phosphatase 01/13/2017 59  38 - 126 U/L Final  . Total Bilirubin 01/13/2017 0.5  0.3 - 1.2 mg/dL Final  . GFR calc non Af Amer 01/13/2017 >60  >60 mL/min Final  . GFR calc Af Amer 01/13/2017 >60  >60 mL/min Final   Comment: (NOTE) The eGFR has been calculated using the CKD EPI equation. This calculation has not been validated in all clinical situations. eGFR's  persistently <60 mL/min signify possible Chronic Kidney Disease.   . Anion gap 01/13/2017 8  5 - 15 Final  . TSH 01/13/2017 1.401  0.350 - 4.500 uIU/mL Final   Performed by a 3rd Generation assay with a functional sensitivity of <=0.01 uIU/mL.  . Free T4 01/13/2017 0.87  0.61 - 1.12 ng/dL Final   Comment: (NOTE) Biotin ingestion may interfere with free T4 tests. If the results are inconsistent with the TSH level, previous test results, or the clinical presentation, then consider biotin interference. If needed, order repeat testing after stopping biotin.   Appointment on 01/13/2017  Component Date Value Ref Range Status  .  Prostatic Specific Antigen 01/13/2017 1.91  0.00 - 4.00 ng/mL Final   Comment: (NOTE) While PSA levels of <=4.0 ng/ml are reported as reference range, some men with levels below 4.0 ng/ml can have prostate cancer and many men with PSA above 4.0 ng/ml do not have prostate cancer.  Other tests such as free PSA, age specific reference ranges, PSA velocity and PSA doubling time may be helpful especially in men less than 38 years old. Performed at [facility], [place] [address], [place], [place] [address]     Assessment:  Benjamin Torres is a 61 y.o. male with stage III supraglottic larynx cancer s/p concurrent cetuximab and radiation.  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 PET scan on 08/18/2013 revealed hypermetabolic soft tissue lesion in the right hypopharynx consistent with epiglottic lesion. There also was a high right paratracheal lymph node which was also hypermetabolic and suspicious for metastatic disease.  Clinical stage was T2N1M0.  He was treated with radiation therapy and cetuximab beginning 09/07/2013.  He received 7,000 cGy from 09/07/2013 - 11/10/2013.    PET scan on 06/21/2015 revealed no evidence of recurrent disease.  ENT evaluation in 11/2016 was negative per patient.  He has hypothyroidism.  He is on  Synthroid.  He is unsure if he has every had a colonoscopy.  PSA is unknown.  Symptomatically, he feels good.  Weight is down 21 pounds in 7 months.  He is eating well.  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  Thyroid function is normal on supplementation.  Plan: 1.  Labs today:  CBC with diff, CMP, TSH, free T4, PSA. 2.  Discuss concern for weight loss. 3.  Discuss referral to GI for colonoscopy. 4.  Continue follow-up.  He should have ENT evaluation every 4-8 months as he is in his third year of follow-up. Once he is more than 5 years post therapy, he will have a evaluation every 12 months.  Check TSH every 6 months. 5.  Continue Synthroid 112 mcg po q day. 6.  RTC in 3 weeks for MD assessment.   Lequita Asal, MD  01/13/2017

## 2017-01-24 ENCOUNTER — Encounter: Payer: Self-pay | Admitting: Hematology and Oncology

## 2017-01-24 DIAGNOSIS — R634 Abnormal weight loss: Secondary | ICD-10-CM | POA: Insufficient documentation

## 2017-02-03 ENCOUNTER — Inpatient Hospital Stay: Payer: BLUE CROSS/BLUE SHIELD | Attending: Hematology and Oncology | Admitting: Hematology and Oncology

## 2017-02-03 VITALS — BP 128/85 | HR 71 | Temp 97.9°F | Resp 18 | Wt 152.6 lb

## 2017-02-03 DIAGNOSIS — E038 Other specified hypothyroidism: Secondary | ICD-10-CM

## 2017-02-03 DIAGNOSIS — N400 Enlarged prostate without lower urinary tract symptoms: Secondary | ICD-10-CM | POA: Diagnosis not present

## 2017-02-03 DIAGNOSIS — Z79899 Other long term (current) drug therapy: Secondary | ICD-10-CM | POA: Insufficient documentation

## 2017-02-03 DIAGNOSIS — I700 Atherosclerosis of aorta: Secondary | ICD-10-CM | POA: Diagnosis not present

## 2017-02-03 DIAGNOSIS — J439 Emphysema, unspecified: Secondary | ICD-10-CM | POA: Diagnosis not present

## 2017-02-03 DIAGNOSIS — C329 Malignant neoplasm of larynx, unspecified: Secondary | ICD-10-CM

## 2017-02-03 DIAGNOSIS — Z8521 Personal history of malignant neoplasm of larynx: Secondary | ICD-10-CM | POA: Diagnosis not present

## 2017-02-03 DIAGNOSIS — C321 Malignant neoplasm of supraglottis: Secondary | ICD-10-CM | POA: Diagnosis not present

## 2017-02-03 DIAGNOSIS — K117 Disturbances of salivary secretion: Secondary | ICD-10-CM | POA: Diagnosis not present

## 2017-02-03 DIAGNOSIS — R634 Abnormal weight loss: Secondary | ICD-10-CM

## 2017-02-03 DIAGNOSIS — L658 Other specified nonscarring hair loss: Secondary | ICD-10-CM | POA: Diagnosis not present

## 2017-02-03 DIAGNOSIS — N323 Diverticulum of bladder: Secondary | ICD-10-CM | POA: Diagnosis not present

## 2017-02-03 DIAGNOSIS — E039 Hypothyroidism, unspecified: Secondary | ICD-10-CM | POA: Diagnosis not present

## 2017-02-03 DIAGNOSIS — Z923 Personal history of irradiation: Secondary | ICD-10-CM | POA: Diagnosis not present

## 2017-02-03 DIAGNOSIS — Z9221 Personal history of antineoplastic chemotherapy: Secondary | ICD-10-CM | POA: Diagnosis not present

## 2017-02-03 DIAGNOSIS — Z87891 Personal history of nicotine dependence: Secondary | ICD-10-CM | POA: Insufficient documentation

## 2017-02-03 NOTE — Progress Notes (Signed)
[facility] [facility] [facility] day:  02/03/2017   Chief Complaint: Benjamin Torres is a 61 y.o. 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  HPI:  The patient was last seen in the medical oncology clinic on 01/13/2017.  At that time, he felt good.  Weight was down 21 pounds in 7 months.  He was eating well.  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  TSH and free T4 were normal on supplementation. PSA was 1.91.  He was referred to GI for a colonoscopy. Last colonoscopy was "30 years ago". Patient is scheduled for an appointment with GI on 02/25/2017.  During the interim, patient doing "ok". He denies any difficulty swallowing. Patient notes that he "gives out of breath easily". He recently appreciated a "mass" in his RIGHT lateral chest wall. He denies pain in this area. He is having nocturia, and is only able to void small amounts. Patient has some difficulties initiating his urinary stream at times. Patient continues to lose weight.    Past Medical History:  Diagnosis Date  . Cancer of larynx ([place]) 11/06/2014    No past surgical history on file.  No family history on file.  Social History:  reports that he has quit smoking. He has never used smokeless tobacco. His alcohol and drug histories are not on file.  He likes to ride motorcycles.  He likes to play soft ball.  He works a Armed forces logistics/support/administrative officer.  The patient is accompanied by his sister today.  Allergies: No Known Allergies  Current Medications: Current Outpatient Prescriptions  Medication Sig Dispense Refill  . levothyroxine (SYNTHROID, LEVOTHROID) 112 MCG tablet Take 1 tablet (112 mcg total) by mouth daily before breakfast. 30 tablet 1  . lisinopril (PRINIVIL,ZESTRIL) 10 MG tablet TAKE ONE (1) TABLET BY MOUTH EVERY DAY (Patient not taking: Reported on 12/21/2015) 30 tablet 0  . omeprazole (PRILOSEC) 20 MG capsule Take 20 mg by mouth daily.     No current  facility-administered medications for this visit.     Review of Systems:  GENERAL:  Feels "ok".  No fevers or sweats.  Weight down 3 pounds; total of 24 pound weight loss sine 05/2016. PERFORMANCE STATUS (ECOG):  0 HEENT:  Dry mouth (not using Salivamax).  No visual changes, runny nose, sore throat, mouth sores or tenderness. Lungs: "Gives out of breath easily".  No cough.  No hemoptysis. Cardiac:  No chest pain, palpitations, orthopnea, or PND. GI:  Eating well.  No nausea, vomiting, diarrhea, constipation, melena or hematochezia. GU:  Difficulty initiating stream at times.  Voids small volumes.  Nocturia.  No urgency, frequency, dysuria, or hematuria. Musculoskeletal:  Right lateral chest wall "mass".  No back pain.  No joint pain.  No muscle tenderness. Extremities:  No pain or swelling. Skin:  No rashes or skin changes. Neuro:  No headache, numbness or weakness, balance or coordination issues. Endocrine:  No diabetes.  Hypothyroid on Synthroid.  No hot flashes or night sweats. Psych:  No mood changes, depression or anxiety. Pain:  No focal pain. Review of systems:  All other systems reviewed and found to be negative.  Physical Exam: Blood pressure 128/85, pulse 71, temperature 97.9 F (36.6 C), temperature source Tympanic, resp. rate 18, weight 152 lb 9.6 oz (69.2 kg). GENERAL:  Well developed, well nourished, sitting comfortably in the exam room in no acute distress. MENTAL STATUS:  Alert and oriented to person, place and time.  HEAD:  Alopecia.  Goatee.  Normocephalic, atraumatic, face symmetric, no Cushingoid features. EYES:  Brown eyes.  Pupils equal round and reactive to light and accomodation.  No conjunctivitis or scleral icterus. ENT:  Oropharynx clear without lesion.  Missing several teeth.  Tongue normal. Mucous membranes slightly moist.  RESPIRATORY:  Clear to auscultation without rales, wheezes or rhonchi. CARDIOVASCULAR:  Regular rate and rhythm without murmur, rub or  gallop. ABDOMEN:  Soft, non-tender, with active bowel sounds, and no appreciable hepatosplenomegaly.  Slight fullness in RUQ.  No distinct masses. SKIN:  Tattoo right arm.  No rashes, ulcers or lesions. EXTREMITIES: No edema, no skin discoloration or tenderness.  No palpable cords. LYMPH NODES: No palpable cervical, supraclavicular, axillary or inguinal adenopathy  NEUROLOGICAL: Unremarkable. PSYCH:  Appropriate.   No visits with results within 3 Day(s) from this visit.  Latest known visit with results is:  Office Visit on 01/13/2017  Component Date Value Ref Range Status  . WBC 01/13/2017 7.1  3.8 - 10.6 K/uL Final  . RBC 01/13/2017 4.89  4.40 - 5.90 MIL/uL Final  . Hemoglobin 01/13/2017 14.5  13.0 - 18.0 g/dL Final  . HCT 01/13/2017 42.5  40.0 - 52.0 % Final  . MCV 01/13/2017 86.9  80.0 - 100.0 fL Final  . MCH 01/13/2017 29.7  26.0 - 34.0 pg Final  . MCHC 01/13/2017 34.2  32.0 - 36.0 g/dL Final  . RDW 01/13/2017 14.2  11.5 - 14.5 % Final  . Platelets 01/13/2017 220  150 - 440 K/uL Final  . Neutrophils Relative % 01/13/2017 70  % Final  . Neutro Abs 01/13/2017 5.0  1.4 - 6.5 K/uL Final  . Lymphocytes Relative 01/13/2017 18  % Final  . Lymphs Abs 01/13/2017 1.3  1.0 - 3.6 K/uL Final  . Monocytes Relative 01/13/2017 9  % Final  . Monocytes Absolute 01/13/2017 0.6  0.2 - 1.0 K/uL Final  . Eosinophils Relative 01/13/2017 2  % Final  . Eosinophils Absolute 01/13/2017 0.1  0 - 0.7 K/uL Final  . Basophils Relative 01/13/2017 1  % Final  . Basophils Absolute 01/13/2017 0.0  0 - 0.1 K/uL Final  . Sodium 01/13/2017 137  135 - 145 mmol/L Final  . Potassium 01/13/2017 4.1  3.5 - 5.1 mmol/L Final  . Chloride 01/13/2017 103  101 - 111 mmol/L Final  . CO2 01/13/2017 26  22 - 32 mmol/L Final  . Glucose, Bld 01/13/2017 92  65 - 99 mg/dL Final  . BUN 01/13/2017 12  6 - 20 mg/dL Final  . Creatinine, Ser 01/13/2017 0.74  0.61 - 1.24 mg/dL Final  . Calcium 01/13/2017 9.0  8.9 - 10.3 mg/dL Final   . Total Protein 01/13/2017 6.9  6.5 - 8.1 g/dL Final  . Albumin 01/13/2017 4.1  3.5 - 5.0 g/dL Final  . AST 01/13/2017 18  15 - 41 U/L Final  . ALT 01/13/2017 11* 17 - 63 U/L Final  . Alkaline Phosphatase 01/13/2017 59  38 - 126 U/L Final  . Total Bilirubin 01/13/2017 0.5  0.3 - 1.2 mg/dL Final  . GFR calc non Af Amer 01/13/2017 >60  >60 mL/min Final  . GFR calc Af Amer 01/13/2017 >60  >60 mL/min Final   Comment: (NOTE) The eGFR has been calculated using the CKD EPI equation. This calculation has not been validated in all clinical situations. eGFR's persistently <60 mL/min signify possible Chronic Kidney Disease.   . Anion gap 01/13/2017 8  5 - 15 Final  .  TSH 01/13/2017 1.401  0.350 - 4.500 uIU/mL Final   Performed by a 3rd Generation assay with a functional sensitivity of <=0.01 uIU/mL.  . Free T4 01/13/2017 0.87  0.61 - 1.12 ng/dL Final   Comment: (NOTE) Biotin ingestion may interfere with free T4 tests. If the results are inconsistent with the TSH level, previous test results, or the clinical presentation, then consider biotin interference. If needed, order repeat testing after stopping biotin.     Assessment:  Benjamin Torres is a 61 y.o. male with stage III supraglottic larynx cancer s/p concurrent cetuximab and radiation.  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 PET scan on 08/18/2013 revealed hypermetabolic soft tissue lesion in the right hypopharynx consistent with epiglottic lesion. There also was a high right paratracheal lymph node which was also hypermetabolic and suspicious for metastatic disease.  Clinical stage was T2N1M0.  He was treated with radiation therapy and cetuximab beginning 09/07/2013.  He received 7,000 cGy from 09/07/2013 - 11/10/2013.    PET scan on 06/21/2015 revealed no evidence of recurrent disease.  ENT evaluation in 11/2016 was negative per patient.  He has hypothyroidism.  He is on Synthroid.  He is unsure if  he has every had a colonoscopy.  He has a GI appointment on 02/25/2017.  PSA was 1.91 on 01/13/2017.Marland Kitchen  Symptomatically, he feels "ok".  Weight is down 21 pounds in 7 months.  He is eating well.  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  Thyroid function is normal on supplementation.  Plan: 1.  Discuss labs at last visit.  Thyroid function normal.  PSA was normal. 2.  Discuss concern for weight loss. 3.  Discuss referral to GI for colonoscopy. 4.  Continue follow-up.  He should have ENT evaluation every 4-8 months as he is in his third year of follow-up. Once he is more than 5 years post therapy, he will have a evaluation every 12 months.  Check TSH every 6 months. 5.  Continue Synthroid 112 mcg po q day. 6.  Schedule PET scan for 02/05/2017. 7.  RTC after PET scan for MD assessment and to review PET results.    Honor Loh, NP  02/03/2017, 4:00 PM   I saw and evaluated the patient, participating in the key portions of the service and reviewing pertinent diagnostic studies and records.  I reviewed the nurse practitioner's note and agree with the findings and the plan.  The assessment and plan were discussed with the patient.  Additional diagnostic studies of a PET scan are needed to clarify the status of his disease given his ongoing symptomatology and would change the clinical management.  A few questions were asked by the patient and answered.   Nolon Stalls, MD 02/03/2017,4:00 PM

## 2017-02-03 NOTE — Progress Notes (Signed)
Here for follow up. Pt concerned w weight loss. Refuses to consider BOOST as supplement -  " I hate that drink "  ADD-( 24 lb weight loss since 06/20/16 )

## 2017-02-07 ENCOUNTER — Ambulatory Visit: Payer: BLUE CROSS/BLUE SHIELD

## 2017-02-08 ENCOUNTER — Other Ambulatory Visit: Payer: Self-pay | Admitting: Hematology and Oncology

## 2017-02-08 DIAGNOSIS — C159 Malignant neoplasm of esophagus, unspecified: Secondary | ICD-10-CM

## 2017-02-10 ENCOUNTER — Inpatient Hospital Stay: Payer: BLUE CROSS/BLUE SHIELD | Admitting: Hematology and Oncology

## 2017-02-12 ENCOUNTER — Ambulatory Visit
Admission: RE | Admit: 2017-02-12 | Discharge: 2017-02-12 | Disposition: A | Discharge status: Home / Self Care | Payer: BLUE CROSS/BLUE SHIELD | Source: Ambulatory Visit | Attending: Urgent Care | Admitting: Urgent Care

## 2017-02-12 DIAGNOSIS — J439 Emphysema, unspecified: Secondary | ICD-10-CM | POA: Insufficient documentation

## 2017-02-12 DIAGNOSIS — K8020 Calculus of gallbladder without cholecystitis without obstruction: Secondary | ICD-10-CM | POA: Diagnosis not present

## 2017-02-12 DIAGNOSIS — R634 Abnormal weight loss: Secondary | ICD-10-CM | POA: Insufficient documentation

## 2017-02-12 DIAGNOSIS — I700 Atherosclerosis of aorta: Secondary | ICD-10-CM | POA: Insufficient documentation

## 2017-02-12 DIAGNOSIS — C329 Malignant neoplasm of larynx, unspecified: Secondary | ICD-10-CM | POA: Diagnosis present

## 2017-02-12 DIAGNOSIS — N323 Diverticulum of bladder: Secondary | ICD-10-CM | POA: Insufficient documentation

## 2017-02-12 LAB — GLUCOSE, CAPILLARY: Glucose-Capillary: 93 mg/dL (ref 65–99)

## 2017-02-12 MED ORDER — FLUDEOXYGLUCOSE F - 18 (FDG) INJECTION
12.0000 | Freq: Once | INTRAVENOUS | Status: AC | PRN
  Administered 2017-02-12: 12.78 via INTRAVENOUS

## 2017-02-14 ENCOUNTER — Inpatient Hospital Stay (HOSPITAL_BASED_OUTPATIENT_CLINIC_OR_DEPARTMENT_OTHER): Payer: BLUE CROSS/BLUE SHIELD | Admitting: Hematology and Oncology

## 2017-02-14 ENCOUNTER — Encounter: Payer: Self-pay | Admitting: Hematology and Oncology

## 2017-02-14 VITALS — BP 134/90 | HR 66 | Temp 97.5°F | Resp 18 | Ht 66.0 in | Wt 152.0 lb

## 2017-02-14 DIAGNOSIS — E039 Hypothyroidism, unspecified: Secondary | ICD-10-CM | POA: Diagnosis not present

## 2017-02-14 DIAGNOSIS — K117 Disturbances of salivary secretion: Secondary | ICD-10-CM | POA: Diagnosis not present

## 2017-02-14 DIAGNOSIS — J439 Emphysema, unspecified: Secondary | ICD-10-CM | POA: Diagnosis not present

## 2017-02-14 DIAGNOSIS — Z9221 Personal history of antineoplastic chemotherapy: Secondary | ICD-10-CM

## 2017-02-14 DIAGNOSIS — Z79899 Other long term (current) drug therapy: Secondary | ICD-10-CM | POA: Diagnosis not present

## 2017-02-14 DIAGNOSIS — L658 Other specified nonscarring hair loss: Secondary | ICD-10-CM

## 2017-02-14 DIAGNOSIS — I700 Atherosclerosis of aorta: Secondary | ICD-10-CM | POA: Diagnosis not present

## 2017-02-14 DIAGNOSIS — R634 Abnormal weight loss: Secondary | ICD-10-CM

## 2017-02-14 DIAGNOSIS — Z8521 Personal history of malignant neoplasm of larynx: Secondary | ICD-10-CM

## 2017-02-14 DIAGNOSIS — C329 Malignant neoplasm of larynx, unspecified: Secondary | ICD-10-CM

## 2017-02-14 DIAGNOSIS — Z87891 Personal history of nicotine dependence: Secondary | ICD-10-CM | POA: Diagnosis not present

## 2017-02-14 DIAGNOSIS — N323 Diverticulum of bladder: Secondary | ICD-10-CM

## 2017-02-14 DIAGNOSIS — Z923 Personal history of irradiation: Secondary | ICD-10-CM

## 2017-02-14 NOTE — Progress Notes (Signed)
Patient here for Larynx cancer. Patient here to discuss pet scan results.  pt has an apt w/Dr. Allen Norris on 02/25/17 to discuss colonoscopy.  He reports bladder frequency at bedtime and would like to be evaluated by urology.

## 2017-02-14 NOTE — Progress Notes (Signed)
[facility] day:  02/14/2017   Chief Complaint: Benjamin Torres is a 61 y.o. 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  HPI:  The patient was last seen in the medical oncology clinic on 02/03/2017.  At that time,he felt "ok".   He noted "giving out of breath easily". He recently appreciated a "mass" in his RIGHT lateral chest wall.  He described urinary symptoms.  He continued to lose weight despite eating well.  Thyroid function was normal.  Decision was made to pursue a PET scan.  PET scan on 02/12/2017 revealed no hypermetabolic recurrent disease in the larynx.  There was no evidence of hypermetabolic locoregional or distant metastatic disease.  There was aortic atherosclerosis and emphysema.  There was cholelithiasis.  There was a 3.2 cm posterior bladder diverticulum, mildly increased in size.  There was borderline mild prostatomegaly.  Symptomatically, patient is doing well. He denies any new concerns today. Patient denies any B symptoms. He is eating "ok", with no demonstrated weight loss since his last visit. Patient denies any bleeding. He has a colonoscopy scheduled on 02/25/2017.   Past Medical History:  Diagnosis Date  . Cancer of larynx ([place]) 11/06/2014    No past surgical history on file.  No family history on file.  Social History:  reports that he has quit smoking. He has never used smokeless tobacco. His alcohol and drug histories are not on file.  He likes to ride motorcycles.  He likes to play soft ball.  He works a Armed forces logistics/support/administrative officer.  The patient is accompanied by his sister and brother today.  Allergies: No Known Allergies  Current Medications: Current Outpatient Prescriptions  Medication Sig Dispense Refill  . levothyroxine (SYNTHROID, LEVOTHROID) 112 MCG tablet TAKE 1 TABLET (112 MCG TOTAL) BY MOUTH DAILY BEFORE BREAKFAST. 30 tablet 1   No current  facility-administered medications for this visit.     Review of Systems:  GENERAL:  Feels "ok".  No fevers or sweats.  Weight stable; total of 24 pound weight loss sine 05/2016. PERFORMANCE STATUS (ECOG):  0 HEENT:  Dry mouth (not using Salivamax).  No visual changes, runny nose, sore throat, mouth sores or tenderness. Lungs: "Gives out of breath easily".  No cough.  No hemoptysis. Cardiac:  No chest pain, palpitations, orthopnea, or PND. GI:  Eating well.  No nausea, vomiting, diarrhea, constipation, melena or hematochezia. GU:  Difficulty initiating stream at times.  Voids small volumes.  Nocturia.  No urgency, frequency, dysuria, or hematuria. Musculoskeletal:  No back pain.  No joint pain.  No muscle tenderness. Extremities:  No pain or swelling. Skin:  No rashes or skin changes. Neuro:  No headache, numbness or weakness, balance or coordination issues. Endocrine:  No diabetes.  Hypothyroid on Synthroid.  No hot flashes or night sweats. Psych:  No mood changes, depression or anxiety. Pain:  No focal pain. Review of systems:  All other systems reviewed and found to be negative.  Physical Exam: Blood pressure 134/90, pulse 66, temperature (!) 97.5 F (36.4 C), temperature source Tympanic, resp. rate 18, height 5\' 6"  (1.676 m), weight 152 lb (68.9 kg). GENERAL:  Well developed, well nourished, gentleman sitting comfortably in the exam room in no acute distress. MENTAL STATUS:  Alert and oriented to person, place and time. HEAD:  Wearing a cap.  Alopecia.  Goatee.  Normocephalic, atraumatic, face symmetric, no Cushingoid features. EYES:  Brown eyes.  No conjunctivitis or scleral icterus. NEUROLOGICAL: Unremarkable. PSYCH:  Appropriate.   Hospital Outpatient Visit on 02/12/2017  Component Date Value Ref Range Status  . Glucose-Capillary 02/12/2017 93  65 - 99 mg/dL Final    Assessment:  Benjamin Torres is a 61 y.o. male with stage III supraglottic larynx cancer s/p concurrent  cetuximab and radiation.  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 PET scan on 08/18/2013 revealed hypermetabolic soft tissue lesion in the right hypopharynx consistent with epiglottic lesion. There also was a high right paratracheal lymph node which was also hypermetabolic and suspicious for metastatic disease.  Clinical stage was T2N1M0.  He was treated with radiation therapy and cetuximab beginning 09/07/2013.  He received 7,000 cGy from 09/07/2013 - 11/10/2013.    PET scan on 06/21/2015 revealed no evidence of recurrent disease.  PET scan on 02/12/2017 revealed no evidence of hypermetabolic locoregional or distant metastatic disease.  ENT evaluation in 11/2016 was negative per patient.  He has hypothyroidism.  He is on Synthroid.  He is unsure if he has every had a colonoscopy.  He has a GI appointment on 02/25/2017.  PSA was 1.91 on 01/13/2017.Marland Kitchen  Symptomatically, he feels "ok".  Weight is stable.  He is eating "ok".  Plan: 1.  Review interval PET scan- no evidence of malignancy. 2.  Discuss referral to nutrition. 3.  Discuss bladder symptoms and urology evaluation. Will refer to Dr. Bernardo [facility]ater at [facility].  4.  RTC in 3 months for a weight check. 5.  RTC in 6 months for MD assessment, labs (CBC with diff, CMP, TSH)   Honor Loh, NP  02/14/2017, 12:08 PM   I saw and evaluated the patient, participating in the key portions of the service and reviewing pertinent diagnostic studies and records.  I reviewed the nurse practitioner's note and agree with the findings and the plan.  The assessment and plan were discussed with the patient.  A few questions were asked by the patient and answered.   Nolon Stalls, MD 02/14/2017,12:08 PM

## 2017-02-15 ENCOUNTER — Encounter: Payer: Self-pay | Admitting: Hematology and Oncology

## 2017-02-25 ENCOUNTER — Ambulatory Visit (INDEPENDENT_AMBULATORY_CARE_PROVIDER_SITE_OTHER): Payer: BLUE CROSS/BLUE SHIELD | Admitting: Gastroenterology

## 2017-02-25 ENCOUNTER — Encounter: Payer: Self-pay | Admitting: Gastroenterology

## 2017-02-25 ENCOUNTER — Other Ambulatory Visit: Payer: Self-pay

## 2017-02-25 VITALS — BP 148/88 | HR 83 | Temp 98.3°F | Ht 66.0 in | Wt 152.6 lb

## 2017-02-25 DIAGNOSIS — R634 Abnormal weight loss: Secondary | ICD-10-CM | POA: Diagnosis not present

## 2017-02-26 NOTE — Progress Notes (Signed)
Gastroenterology Consultation  Referring Provider:     Cletis Athens, MD Primary Care Physician:  Cletis Athens, MD Primary Gastroenterologist:  Dr. Allen Norris     Reason for Consultation:     Weight loss        HPI:   Benjamin Torres is a 61 y.o. y/o male referred for consultation & management of weight loss by Dr. Cletis Athens, MD.  This patient comes today with a report of weight loss. The patient has lost approximately 25 pounds recently. The patient has a history of throat cancer and has been treated by oncology. The patient states that he feels full early and does not eat as much as he used to. The patient reports that he used to be able to  finished to full plates of food but now he reports that he can finish only one. There is no report of any change in bowel habits. The patient also denies any family history of colon cancer colon polyps. The patient has never had a upper endoscopy or colonoscopy that he can recall. He also denies any abdominal pain associated with weight loss.  Past Medical History:  Diagnosis Date  . Cancer of larynx ([place]) 11/06/2014    History reviewed. No pertinent surgical history.  Prior to Admission medications   Medication Sig Start Date End Date Taking? Authorizing Provider  levothyroxine (SYNTHROID, LEVOTHROID) 112 MCG tablet TAKE 1 TABLET (112 MCG TOTAL) BY MOUTH DAILY BEFORE BREAKFAST. 02/10/17  Yes Lequita Asal, MD    History reviewed. No pertinent family history.   Social History  Substance Use Topics  . Smoking status: Former Research scientist (life sciences)  . Smokeless tobacco: Never Used  . Alcohol use Not on file    Allergies as of 02/25/2017  . (No Known Allergies)    Review of Systems:    All systems reviewed and negative except where noted in HPI.   Physical Exam:  BP (!) 148/88   Pulse 83   Temp 98.3 F (36.8 C) (Oral)   Ht 5\' 6"  (1.676 m)   Wt 152 lb 9.6 oz (69.2 kg)   BMI 24.63 kg/m  No LMP for male patient. Psych:  Alert and  cooperative. Normal mood and affect. General:   Alert,  Well-developed, well-nourished, pleasant and cooperative in NAD Head:  Normocephalic and atraumatic. Eyes:  Sclera clear, no icterus.   Conjunctiva pink. Ears:  Normal auditory acuity. Nose:  No deformity, discharge, or lesions. Mouth:  No deformity or lesions,oropharynx pink & moist. Neck:  Supple; no masses or thyromegaly. Lungs:  Respirations even and unlabored.  Clear throughout to auscultation.   No wheezes, crackles, or rhonchi. No acute distress. Heart:  Regular rate and rhythm; no murmurs, clicks, rubs, or gallops. Abdomen:  Normal bowel sounds.  No bruits.  Soft, non-tender and non-distended without masses, hepatosplenomegaly or hernias noted.  No guarding or rebound tenderness.  Negative Carnett sign.   Rectal:  Deferred.  Msk:  Symmetrical without gross deformities.  Good, equal movement & strength bilaterally. Pulses:  Normal pulses noted. Extremities:  No clubbing or edema.  No cyanosis. Neurologic:  Alert and oriented x3;  grossly normal neurologically. Skin:  Intact without significant lesions or rashes.  No jaundice. Lymph Nodes:  No significant cervical adenopathy. Psych:  Alert and cooperative. Normal mood and affect.  Imaging Studies: Nm Pet Image Restag (ps) Skull Base To Thigh  Result Date: 02/12/2017 CLINICAL DATA:  Subsequent treatment strategy for stage IIIB right supraglottic larynx cancer diagnosed  in 2015 and treated with concurrent chemo radiation therapy. Weight loss. EXAM: NUCLEAR MEDICINE PET SKULL BASE TO THIGH TECHNIQUE: 12.8 mCi F-18 FDG was injected intravenously. Full-ring PET imaging was performed from the skull base to thigh after the radiotracer. CT data was obtained and used for attenuation correction and anatomic localization. FASTING BLOOD GLUCOSE:  Value: 93 mg/dl COMPARISON:  06/20/2015 PET-CT. FINDINGS: NECK: No hypermetabolic lymph nodes in the neck. No hypermetabolic recurrent disease in the  larynx. Relatively symmetric linear muscular activity related uptake in the paravertebral cervical muscles and scalene mass. CHEST: No pathologically enlarged or hypermetabolic axillary, mediastinal or hilar lymph nodes. No acute consolidative airspace disease, lung masses or significant pulmonary nodules. Moderate centrilobular emphysema with diffuse bronchial wall thickening. Three-vessel coronary atherosclerosis. No pneumothorax. No pleural effusions. ABDOMEN/PELVIS: No abnormal hypermetabolic activity within the liver, pancreas, adrenal glands, or spleen. No hypermetabolic lymph nodes in the abdomen or pelvis. Cholelithiasis. Simple 1.5 cm interpolar left renal cyst. Atherosclerotic nonaneurysmal abdominal aorta. Posterior bladder diverticulum just to the right of midline measures 3.2 cm, previously 2.2 cm, mildly increased in size. Borderline mild prostatomegaly with nonspecific internal prostatic calcifications. SKELETON: No focal hypermetabolic activity to suggest skeletal metastasis. IMPRESSION: 1. No hypermetabolic recurrent disease in the larynx. 2. No evidence of hypermetabolic locoregional or distant metastatic disease. 3. Aortic Atherosclerosis (ICD10-I70.0) and Emphysema (ICD10-J43.9). 4. Cholelithiasis. 5. Posterior bladder diverticulum is mildly increased in size. Borderline mild prostatomegaly. Electronically Signed   By: Ilona Sorrel M.D.   On: 02/12/2017 12:07    Assessment and Plan:   DEARIES MEIKLE is a 61 y.o. y/o male with unexplained weight loss. The patient has a history of throat cancer and has been treated by oncology. The patient has been having early satiety and has never had a colonoscopy in the past. The patient will be set up for an EGD and colonoscopy to look for source for his unexplained weight loss.I have discussed risks & benefits which include, but are not limited to, bleeding, infection, perforation & drug reaction.  The patient agrees with this plan & written consent  will be obtained.     Lucilla Lame, MD. Marval Regal   Note: This dictation was prepared with Dragon dictation along with smaller phrase technology. Any transcriptional errors that result from this process are unintentional.

## 2017-02-27 ENCOUNTER — Encounter: Payer: Self-pay | Admitting: *Deleted

## 2017-02-27 ENCOUNTER — Other Ambulatory Visit: Payer: Self-pay

## 2017-02-27 DIAGNOSIS — R634 Abnormal weight loss: Secondary | ICD-10-CM

## 2017-03-06 ENCOUNTER — Encounter: Payer: Self-pay | Admitting: Urology

## 2017-03-06 ENCOUNTER — Ambulatory Visit: Payer: BLUE CROSS/BLUE SHIELD | Admitting: Urology

## 2017-03-06 VITALS — BP 119/77 | HR 81 | Ht 67.0 in | Wt 152.0 lb

## 2017-03-06 DIAGNOSIS — R350 Frequency of micturition: Secondary | ICD-10-CM | POA: Diagnosis not present

## 2017-03-06 LAB — URINALYSIS, COMPLETE
Bilirubin, UA: NEGATIVE
Glucose, UA: NEGATIVE
Ketones, UA: NEGATIVE
Leukocytes, UA: NEGATIVE
Nitrite, UA: NEGATIVE
PH UA: 5.5 (ref 5.0–7.5)
RBC, UA: NEGATIVE
Specific Gravity, UA: 1.02 (ref 1.005–1.030)
Urobilinogen, Ur: 0.2 mg/dL (ref 0.2–1.0)

## 2017-03-06 LAB — MICROSCOPIC EXAMINATION
EPITHELIAL CELLS (NON RENAL): NONE SEEN /HPF (ref 0–10)
RBC MICROSCOPIC, UA: NONE SEEN /HPF (ref 0–?)
WBC, UA: NONE SEEN /hpf (ref 0–?)

## 2017-03-06 LAB — BLADDER SCAN AMB NON-IMAGING

## 2017-03-06 MED ORDER — TAMSULOSIN HCL 0.4 MG PO CAPS: 0.4000 mg | ORAL_CAPSULE | Freq: Every day | ORAL | 0 refills | Status: DC

## 2017-03-06 NOTE — Discharge Instructions (Signed)
General Anesthesia, Adult, Care After °These instructions provide you with information about caring for yourself after your procedure. Your health care provider may also give you more specific instructions. Your treatment has been planned according to current medical practices, but problems sometimes occur. Call your health care provider if you have any problems or questions after your procedure. °What can I expect after the procedure? °After the procedure, it is common to have: °· Vomiting. °· A sore throat. °· Mental slowness. ° °It is common to feel: °· Nauseous. °· Cold or shivery. °· Sleepy. °· Tired. °· Sore or achy, even in parts of your body where you did not have surgery. ° °Follow these instructions at home: °For at least 24 hours after the procedure: °· Do not: °? Participate in activities where you could fall or become injured. °? Drive. °? Use heavy machinery. °? Drink alcohol. °? Take sleeping pills or medicines that cause drowsiness. °? Make important decisions or sign legal documents. °? Take care of children on your own. °· Rest. °Eating and drinking °· If you vomit, drink water, juice, or soup when you can drink without vomiting. °· Drink enough fluid to keep your urine clear or pale yellow. °· Make sure you have little or no nausea before eating solid foods. °· Follow the diet recommended by your health care provider. °General instructions °· Have a responsible adult stay with you until you are awake and alert. °· Return to your normal activities as told by your health care provider. Ask your health care provider what activities are safe for you. °· Take over-the-counter and prescription medicines only as told by your health care provider. °· If you smoke, do not smoke without supervision. °· Keep all follow-up visits as told by your health care provider. This is important. °Contact a health care provider if: °· You continue to have nausea or vomiting at home, and medicines are not helpful. °· You  cannot drink fluids or start eating again. °· You cannot urinate after 8-12 hours. °· You develop a skin rash. °· You have fever. °· You have increasing redness at the site of your procedure. °Get help right away if: °· You have difficulty breathing. °· You have chest pain. °· You have unexpected bleeding. °· You feel that you are having a life-threatening or urgent problem. °This information is not intended to replace advice given to you by your health care provider. Make sure you discuss any questions you have with your health care provider. °Document Released: 07/22/2000 Document Revised: 09/18/2015 Document Reviewed: 03/30/2015 °Elsevier Interactive Patient Education © 2018 [employer]vier Inc. ° °

## 2017-03-06 NOTE — Progress Notes (Signed)
03/06/2017 2:54 PM   Gray Bernhardt February 23, 1956 [id number]  Referring provider: Cletis Athens, MD [address] [place], [place] [address]  Chief Complaint  Patient presents with  . Urinary Frequency    New Patient    HPI: Benjamin Torres is a 61 year old male seen in consultation at the request of Dr. Mike Gip for evaluation of prostate enlargement and lower urinary tract symptoms.  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  He was incidentally noted to have prostate enlargement and a 3 cm posterior bladder diverticulum.  He has moderate to severe lower urinary tract symptoms consisting of sensation of incomplete emptying, frequency, intermittent urinary stream, weak urinary stream and nocturia x2.  PSA has completed today was 21/35 with a QOL rated 4/6.  He denies dysuria or gross hematuria.  He denies flank, abdominal, pelvic or scrotal pain.  There is no previous history of urologic problems.   PMH: Past Medical History:  Diagnosis Date  . Cancer of larynx ([facility]) 11/06/2014  . COPD (chronic obstructive pulmonary disease) ([facility])   . Hypothyroidism   . PE (pulmonary thromboembolism) ([facility])    right lung, over 10 yrs ago    Surgical History: Past Surgical History:  Procedure Laterality Date  . ANAL FISSURE REPAIR    . APPENDECTOMY    . BIOPSY PHARYNX  2015   Larynx BX    Home Medications:  Allergies as of 03/06/2017   No Known Allergies     Medication List        Accurate as of 03/06/17  2:54 PM. Always use your most recent med list.          levothyroxine 112 MCG tablet Commonly known as:  SYNTHROID, LEVOTHROID TAKE 1 TABLET (112 MCG TOTAL) BY MOUTH DAILY BEFORE BREAKFAST.       Allergies: No Known Allergies  Family History: History reviewed. No pertinent family history.  Social History:  reports that he quit smoking about 3 years ago. he has never used smokeless tobacco.  He reports that he does not drink alcohol. His drug history is not on file.  ROS: UROLOGY Frequent Urination?: Yes Hard to postpone urination?: Yes Burning/pain with urination?: No Get up at night to urinate?: Yes Leakage of urine?: No Urine stream starts and stops?: Yes Trouble starting stream?: Yes Do you have to strain to urinate?: No Blood in urine?: No Urinary tract infection?: No Sexually transmitted disease?: No Injury to kidneys or bladder?: No Painful intercourse?: No Weak stream?: Yes Erection problems?: No Penile pain?: No  Gastrointestinal Nausea?: No Vomiting?: No Indigestion/heartburn?: No Diarrhea?: No Constipation?: No  Constitutional Fever: No Night sweats?: No Weight loss?: Yes Fatigue?: No  Skin Skin rash/lesions?: No Itching?: No  Eyes Blurred vision?: No Double vision?: No  Ears/Nose/Throat Sore throat?: No Sinus problems?: No  Hematologic/Lymphatic Swollen glands?: No Easy bruising?: No  Cardiovascular Leg swelling?: No Chest pain?: No  Respiratory Cough?: No Shortness of breath?: Yes  Endocrine Excessive thirst?: No  Musculoskeletal Back pain?: No Joint pain?: No  Neurological Headaches?: No Dizziness?: No  Psychologic Depression?: No Anxiety?: No  Physical Exam: BP 119/77   Pulse 81   Ht 5\' 7"  (1.702 m)   Wt 152 lb (68.9 kg)   BMI 23.81 kg/m   Constitutional:  Alert and oriented, No acute distress. HEENT:  AT, moist mucus membranes.  Trachea midline, no masses. Cardiovascular: No clubbing, cyanosis, or edema.  Respiratory: Normal respiratory effort, no increased work of breathing. GI: Abdomen is soft, nontender, nondistended, no abdominal masses GU: No CVA tenderness.  Penis circumcised without lesions, testes descended bilaterally without masses or tenderness, cord/epididymes palpably normal.  Prostate 45 g, smooth without nodules. Skin: No rashes, bruises or suspicious lesions. Lymph: No cervical or  inguinal adenopathy. Neurologic: Grossly intact, no focal deficits, moving all 4 extremities. Psychiatric: Normal mood and affect.  Laboratory Data: Lab Results  Component Value Date   WBC 7.1 01/13/2017   HGB 14.5 01/13/2017   HCT 42.5 01/13/2017   MCV 86.9 01/13/2017   PLT 220 01/13/2017    Lab Results  Component Value Date   CREATININE 0.74 01/13/2017    Assessment & Plan:    1.  BPH with lower urinary tract symptoms  PVR by bladder scan was 59 mL.  His/CT was reviewed and the bladder diverticulum is smooth-walled without evidence of stones.  He has no history of recurrent UTIs and no treatment is needed.  I discussed starting medication for his lower urinary tract symptoms and he was not sure his symptoms are bothersome enough that he desires treatment.  I did initially recommend a month trial of tamsulosin.  Follow-up annually or as needed for any significant change in his symptoms.   - BLADDER SCAN AMB [place], MD  [facility] [address] [place], [place] [address] [phone]

## 2017-03-07 ENCOUNTER — Ambulatory Visit
Admission: RE | Admit: 2017-03-07 | Discharge: 2017-03-07 | Disposition: A | Discharge status: Home / Self Care | Payer: BLUE CROSS/BLUE SHIELD | Source: Ambulatory Visit | Attending: Gastroenterology | Admitting: Gastroenterology

## 2017-03-07 ENCOUNTER — Ambulatory Visit: Payer: BLUE CROSS/BLUE SHIELD | Admitting: Anesthesiology

## 2017-03-07 ENCOUNTER — Encounter
Admission: RE | Disposition: A | Discharge status: Home / Self Care | Payer: Self-pay | Source: Ambulatory Visit | Attending: Gastroenterology

## 2017-03-07 DIAGNOSIS — E039 Hypothyroidism, unspecified: Secondary | ICD-10-CM | POA: Insufficient documentation

## 2017-03-07 DIAGNOSIS — K449 Diaphragmatic hernia without obstruction or gangrene: Secondary | ICD-10-CM | POA: Diagnosis not present

## 2017-03-07 DIAGNOSIS — Z87891 Personal history of nicotine dependence: Secondary | ICD-10-CM | POA: Diagnosis not present

## 2017-03-07 DIAGNOSIS — K640 First degree hemorrhoids: Secondary | ICD-10-CM | POA: Diagnosis not present

## 2017-03-07 DIAGNOSIS — K2970 Gastritis, unspecified, without bleeding: Secondary | ICD-10-CM | POA: Diagnosis not present

## 2017-03-07 DIAGNOSIS — Z8521 Personal history of malignant neoplasm of larynx: Secondary | ICD-10-CM | POA: Diagnosis not present

## 2017-03-07 DIAGNOSIS — K5730 Diverticulosis of large intestine without perforation or abscess without bleeding: Secondary | ICD-10-CM | POA: Diagnosis not present

## 2017-03-07 DIAGNOSIS — D125 Benign neoplasm of sigmoid colon: Secondary | ICD-10-CM | POA: Diagnosis not present

## 2017-03-07 DIAGNOSIS — R634 Abnormal weight loss: Secondary | ICD-10-CM | POA: Diagnosis present

## 2017-03-07 DIAGNOSIS — K635 Polyp of colon: Secondary | ICD-10-CM | POA: Diagnosis not present

## 2017-03-07 DIAGNOSIS — J449 Chronic obstructive pulmonary disease, unspecified: Secondary | ICD-10-CM | POA: Insufficient documentation

## 2017-03-07 DIAGNOSIS — Z86711 Personal history of pulmonary embolism: Secondary | ICD-10-CM | POA: Diagnosis not present

## 2017-03-07 DIAGNOSIS — K2980 Duodenitis without bleeding: Secondary | ICD-10-CM | POA: Diagnosis not present

## 2017-03-07 DIAGNOSIS — K2950 Unspecified chronic gastritis without bleeding: Secondary | ICD-10-CM | POA: Insufficient documentation

## 2017-03-07 DIAGNOSIS — Z6824 Body mass index (BMI) 24.0-24.9, adult: Secondary | ICD-10-CM | POA: Insufficient documentation

## 2017-03-07 DIAGNOSIS — D124 Benign neoplasm of descending colon: Secondary | ICD-10-CM

## 2017-03-07 HISTORY — PX: POLYPECTOMY: SHX5525

## 2017-03-07 HISTORY — PX: COLONOSCOPY WITH PROPOFOL: SHX5780

## 2017-03-07 HISTORY — DX: Other pulmonary embolism without acute cor pulmonale: I26.99

## 2017-03-07 HISTORY — DX: Chronic obstructive pulmonary disease, unspecified: J44.9

## 2017-03-07 HISTORY — DX: Hypothyroidism, unspecified: E03.9

## 2017-03-07 HISTORY — PX: ESOPHAGOGASTRODUODENOSCOPY (EGD) WITH PROPOFOL: SHX5813

## 2017-03-07 SURGERY — COLONOSCOPY WITH PROPOFOL
Anesthesia: General | Wound class: Contaminated

## 2017-03-07 MED ORDER — ONDANSETRON HCL 4 MG/2ML IJ SOLN: 4.0000 mg | Freq: Once | INTRAMUSCULAR | Status: DC

## 2017-03-07 MED ORDER — SODIUM CHLORIDE 0.9 % IV SOLN: INTRAVENOUS | Status: DC

## 2017-03-07 MED ORDER — OXYCODONE HCL 5 MG PO TABS: 5.0000 mg | ORAL_TABLET | Freq: Once | ORAL | Status: DC | PRN

## 2017-03-07 MED ORDER — GLYCOPYRROLATE 0.2 MG/ML IJ SOLN
INTRAMUSCULAR | Status: DC | PRN
  Administered 2017-03-07: 0.1 mg via INTRAVENOUS

## 2017-03-07 MED ORDER — LIDOCAINE HCL (CARDIAC) 20 MG/ML IV SOLN
INTRAVENOUS | Status: DC | PRN
  Administered 2017-03-07: 50 mg via INTRAVENOUS

## 2017-03-07 MED ORDER — STERILE WATER FOR IRRIGATION IR SOLN
Status: DC | PRN
  Administered 2017-03-07: 11:00:00

## 2017-03-07 MED ORDER — LACTATED RINGERS IV SOLN
INTRAVENOUS | Status: DC
  Administered 2017-03-07: 09:00:00 via INTRAVENOUS

## 2017-03-07 MED ORDER — OXYCODONE HCL 5 MG/5ML PO SOLN: 5.0000 mg | Freq: Once | ORAL | Status: DC | PRN

## 2017-03-07 MED ORDER — PROPOFOL 10 MG/ML IV BOLUS
INTRAVENOUS | Status: DC | PRN
  Administered 2017-03-07: 20 mg via INTRAVENOUS
  Administered 2017-03-07: 30 mg via INTRAVENOUS
  Administered 2017-03-07: 100 mg via INTRAVENOUS
  Administered 2017-03-07 (×2): 30 mg via INTRAVENOUS
  Administered 2017-03-07: 50 mg via INTRAVENOUS
  Administered 2017-03-07: 30 mg via INTRAVENOUS

## 2017-03-07 MED ORDER — ONDANSETRON HCL 4 MG/2ML IJ SOLN
4.0000 mg | Freq: Once | INTRAMUSCULAR | Status: AC
  Administered 2017-03-07: 4 mg via INTRAVENOUS

## 2017-03-07 SURGICAL SUPPLY — 35 items
BALLN DILATOR 10-12 8 (BALLOONS)
BALLN DILATOR 12-15 8 (BALLOONS)
BALLN DILATOR 15-18 8 (BALLOONS)
BALLN DILATOR CRE 0-12 8 (BALLOONS)
BALLN DILATOR ESOPH 8 10 CRE (MISCELLANEOUS) IMPLANT
BALLOON DILATOR 12-15 8 (BALLOONS) IMPLANT
BALLOON DILATOR 15-18 8 (BALLOONS) IMPLANT
BALLOON DILATOR CRE 0-12 8 (BALLOONS) IMPLANT
BLOCK BITE 60FR ADLT L/F GRN (MISCELLANEOUS) ×4 IMPLANT
CANISTER SUCT 1200ML W/VALVE (MISCELLANEOUS) ×4 IMPLANT
CLIP HMST 235XBRD CATH ROT (MISCELLANEOUS) IMPLANT
CLIP RESOLUTION 360 11X235 (MISCELLANEOUS)
FCP ESCP3.2XJMB 240X2.8X (MISCELLANEOUS)
FORCEPS BIOP RAD 4 LRG CAP 4 (CUTTING FORCEPS) ×4 IMPLANT
FORCEPS BIOP RJ4 240 W/NDL (MISCELLANEOUS)
FORCEPS ESCP3.2XJMB 240X2.8X (MISCELLANEOUS) IMPLANT
GOWN CVR UNV OPN BCK APRN NK (MISCELLANEOUS) ×4 IMPLANT
GOWN ISOL THUMB LOOP REG UNIV (MISCELLANEOUS) ×4
INJECTOR VARIJECT VIN23 (MISCELLANEOUS) IMPLANT
KIT DEFENDO VALVE AND CONN (KITS) IMPLANT
KIT ENDO PROCEDURE OLY (KITS) ×4 IMPLANT
MARKER SPOT ENDO TATTOO 5ML (MISCELLANEOUS) IMPLANT
PAD GROUND ADULT SPLIT (MISCELLANEOUS) IMPLANT
PROBE APC STR FIRE (PROBE) IMPLANT
RETRIEVER NET PLAT FOOD (MISCELLANEOUS) IMPLANT
RETRIEVER NET ROTH 2.5X230 LF (MISCELLANEOUS) IMPLANT
SNARE SHORT THROW 13M SML OVAL (MISCELLANEOUS) ×8 IMPLANT
SNARE SHORT THROW 30M LRG OVAL (MISCELLANEOUS) IMPLANT
SNARE SNG USE RND 15MM (INSTRUMENTS) IMPLANT
SPOT EX ENDOSCOPIC TATTOO (MISCELLANEOUS)
SYR INFLATION 60ML (SYRINGE) IMPLANT
TRAP ETRAP POLY (MISCELLANEOUS) IMPLANT
VARIJECT INJECTOR VIN23 (MISCELLANEOUS)
WATER STERILE IRR 250ML POUR (IV SOLUTION) ×4 IMPLANT
WIRE CRE 18-20MM 8CM F G (MISCELLANEOUS) IMPLANT

## 2017-03-07 NOTE — H&P (Signed)
Benjamin Lame, MD [facility] [address], [place], [place] [address] Phone:[phone] Fax : [phone]  Primary Care Physician:  Benjamin Athens, MD Primary Gastroenterologist:  Dr. Allen Torres  Pre-Procedure History & Physical: HPI:  Benjamin Torres is a 61 y.o. male is here for an endoscopy and colonoscopy.   Past Medical History:  Diagnosis Date  . Cancer of larynx ([facility]) 11/06/2014  . COPD (chronic obstructive pulmonary disease) ([facility])   . Hypothyroidism   . PE (pulmonary thromboembolism) ([facility])    right lung, over 10 yrs ago    Past Surgical History:  Procedure Laterality Date  . ANAL FISSURE REPAIR    . APPENDECTOMY    . BIOPSY PHARYNX  2015   Larynx BX    Prior to Admission medications   Medication Sig Start Date End Date Taking? Authorizing Provider  albuterol (PROVENTIL HFA;VENTOLIN HFA) 108 (90 Base) MCG/ACT inhaler Inhale 2 puffs every 6 (six) hours as needed into the lungs for wheezing or shortness of breath.   Yes [provider]  levothyroxine (SYNTHROID, LEVOTHROID) 112 MCG tablet TAKE 1 TABLET (112 MCG TOTAL) BY MOUTH DAILY BEFORE BREAKFAST. 02/10/17  Yes Corcoran, Drue Second, MD  tamsulosin (FLOMAX) 0.4 MG CAPS capsule Take 1 capsule (0.4 mg total) daily by mouth. Patient not taking: Reported on 03/07/2017 03/06/17   Abbie Sons, MD    Allergies as of 02/27/2017  . (No Known Allergies)    History reviewed. No pertinent family history.  Social History   Socioeconomic History  . Marital status: Married    Spouse name: Not on file  . Number of children: Not on file  . Years of education: Not on file  . Highest education level: Not on file  Social Needs  . Financial resource strain: Not on file  . Food insecurity - worry: Not on file  . Food insecurity - inability: Not on file  . Transportation needs - medical: Not on file  . Transportation needs - non-medical: Not on file  Occupational History  . Not on file  Tobacco Use  . Smoking  status: Former Smoker    Last attempt to quit: 2015    Years since quitting: 3.8  . Smokeless tobacco: Never Used  Substance and Sexual Activity  . Alcohol use: No    Alcohol/week: 0.0 oz  . Drug use: Not on file  . Sexual activity: Not on file  Other Topics Concern  . Not on file  Social History Narrative  . Not on file    Review of Systems: See HPI, otherwise negative ROS  Physical Exam: BP (!) 146/97   Pulse 66   Temp (!) 97.2 F (36.2 C) (Temporal)   Resp 20   Ht 5\' 7"  (1.702 m)   Wt 140 lb (63.5 kg)   SpO2 98%   BMI 21.93 kg/m  General:   Alert,  pleasant and cooperative in NAD Head:  Normocephalic and atraumatic. Neck:  Supple; no masses or thyromegaly. Lungs:  Clear throughout to auscultation.    Heart:  Regular rate and rhythm. Abdomen:  Soft, nontender and nondistended. Normal bowel sounds, without guarding, and without rebound.   Neurologic:  Alert and  oriented x4;  grossly normal neurologically.  Impression/Plan: Benjamin Torres is here for an endoscopy and colonoscopy to be performed for weight loss  Risks, benefits, limitations, and alternatives regarding  endoscopy and colonoscopy have been reviewed with the patient.  Questions have been answered.  All parties agreeable.  Benjamin Lame, MD  03/07/2017, 9:26 AM

## 2017-03-07 NOTE — Anesthesia Postprocedure Evaluation (Signed)
Anesthesia Post Note  Patient: Benjamin Torres  Procedure(s) Performed: COLONOSCOPY WITH PROPOFOL (N/A ) ESOPHAGOGASTRODUODENOSCOPY (EGD) WITH PROPOFOL (N/A ) POLYPECTOMY  Patient location during evaluation: PACU Anesthesia Type: General Level of consciousness: awake and alert Pain management: pain level controlled Vital Signs Assessment: post-procedure vital signs reviewed and stable Respiratory status: spontaneous breathing, nonlabored ventilation, respiratory function stable and patient connected to nasal cannula oxygen Cardiovascular status: blood pressure returned to baseline and stable Postop Assessment: no apparent nausea or vomiting Anesthetic complications: no    Rayaan Lorah

## 2017-03-07 NOTE — Op Note (Signed)
[facility] Gastroenterology Patient Name: Benjamin Torres Procedure Date: 03/07/2017 10:49 AM MRN: [id number] Account #: [id number] Date of Birth: 1955-10-18 Admit Type: Outpatient Age: 61 Room: [facility] OR ROOM 01 Gender: Male Note Status: Finalized Procedure:            Colonoscopy Indications:          Weight loss Providers:            Lucilla Lame MD, MD Medicines:            Propofol per Anesthesia Complications:        No immediate complications. Procedure:            Pre-Anesthesia Assessment:                       - Prior to the procedure, a History and Physical was                        performed, and patient medications and allergies were                        reviewed. The patient's tolerance of previous                        anesthesia was also reviewed. The risks and benefits of                        the procedure and the sedation options and risks were                        discussed with the patient. All questions were                        answered, and informed consent was obtained. Prior                        Anticoagulants: The patient has taken no previous                        anticoagulant or antiplatelet agents. ASA Grade                        Assessment: II - A patient with mild systemic disease.                        After reviewing the risks and benefits, the patient was                        deemed in satisfactory condition to undergo the                        procedure.                       After obtaining informed consent, the colonoscope was                        passed under direct vision. Throughout the procedure,                        the patient's blood pressure, pulse, and oxygen  saturations were monitored continuously. The Olympus                        Colonoscope 190 [id number]) was introduced through the                        anus and advanced to the the cecum, identified by        appendiceal orifice and ileocecal valve. The                        colonoscopy was performed without difficulty. The                        patient tolerated the procedure well. The quality of                        the bowel preparation was excellent. Findings:      The perianal and digital rectal examinations were normal.      Two sessile polyps were found in the descending colon. The polyps were 4       to 5 mm in size. These polyps were removed with a cold snare. Resection       and retrieval were complete.      A 2 mm polyp was found in the sigmoid colon. The polyp was sessile. The       polyp was removed with a cold biopsy forceps. Resection and retrieval       were complete.      A few small-mouthed diverticula were found in the sigmoid colon.      Non-bleeding internal hemorrhoids were found during retroflexion. The       hemorrhoids were Grade I (internal hemorrhoids that do not prolapse). Impression:           - Two 4 to 5 mm polyps in the descending colon, removed                        with a cold snare. Resected and retrieved.                       - One 2 mm polyp in the sigmoid colon, removed with a                        cold biopsy forceps. Resected and retrieved.                       - Diverticulosis in the sigmoid colon.                       - Non-bleeding internal hemorrhoids. Recommendation:       - Discharge patient to home.                       - Resume previous diet.                       - Continue present medications.                       - Await pathology results. Procedure Code(s):    --- Professional ---  45385, Colonoscopy, flexible; with removal of tumor(s),                        polyp(s), or other lesion(s) by snare technique                       45380, 59, Colonoscopy, flexible; with biopsy, single                        or multiple Diagnosis Code(s):    --- Professional ---                       R63.4, Abnormal weight  loss                       D12.4, Benign neoplasm of descending colon                       D12.5, Benign neoplasm of sigmoid colon CPT copyright 2016 American Medical Association. All rights reserved. The codes documented in this report are preliminary and upon coder review may  be revised to meet current compliance requirements. Lucilla Lame MD, MD 03/07/2017 11:26:54 AM This report has been signed electronically. Number of Addenda: 0 Note Initiated On: 03/07/2017 10:49 AM Scope Withdrawal Time: 0 hours 14 minutes 11 seconds  Total Procedure Duration: 0 hours 17 minutes 58 seconds       [facility]

## 2017-03-07 NOTE — Anesthesia Preprocedure Evaluation (Signed)
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 in 2015 > radtx and chemotx;   Pulmonary exam normal        Cardiovascular Exercise Tolerance: Good negative cardio ROS Normal cardiovascular exam     Neuro/Psych negative neurological ROS  negative psych ROS   GI/Hepatic Neg liver ROS, +nausea this am; zofran given in preop   Endo/Other  Hypothyroidism   Renal/GU negative Renal ROS   bph     Musculoskeletal   Abdominal   Peds  Hematology  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;   Anesthesia Other Findings tiva  Reproductive/Obstetrics                             Anesthesia Physical Anesthesia Plan  ASA: II  Anesthesia Plan: General   Post-op Pain Management:    Induction:   PONV Risk Score and Plan:   Airway Management Planned:   Additional Equipment:   Intra-op Plan:   Post-operative Plan:   Informed Consent: I have reviewed the patients History and Physical, chart, labs and discussed the procedure including the risks, benefits and alternatives for the proposed anesthesia with the patient or authorized representative who has indicated his/her understanding and acceptance.     Plan Discussed with: CRNA  Anesthesia Plan Comments:         Anesthesia Quick Evaluation

## 2017-03-07 NOTE — Transfer of Care (Signed)
Immediate Anesthesia Transfer of Care Note  Patient: Benjamin Torres  Procedure(s) Performed: COLONOSCOPY WITH PROPOFOL (N/A ) ESOPHAGOGASTRODUODENOSCOPY (EGD) WITH PROPOFOL (N/A ) POLYPECTOMY  Patient Location: PACU  Anesthesia Type: General  Level of Consciousness: awake, alert  and patient cooperative  Airway and Oxygen Therapy: Patient Spontanous Breathing and Patient connected to supplemental oxygen  Post-op Assessment: Post-op Vital signs reviewed, Patient's Cardiovascular Status Stable, Respiratory Function Stable, Patent Airway and No signs of Nausea or vomiting  Post-op Vital Signs: Reviewed and stable  Complications: No apparent anesthesia complications

## 2017-03-07 NOTE — Anesthesia Procedure Notes (Signed)
Date/Time: 03/07/2017 10:52 AM Performed by: Cameron Ali, CRNA Pre-anesthesia Checklist: Patient identified, Emergency Drugs available, Suction available, Timeout performed and Patient being monitored Patient Re-evaluated:Patient Re-evaluated prior to induction Oxygen Delivery Method: Nasal cannula Placement Confirmation: positive ETCO2

## 2017-03-07 NOTE — Op Note (Signed)
[facility] Gastroenterology Patient Name: Benjamin Torres Procedure Date: 03/07/2017 10:45 AM MRN: [id number] Account #: [id number] Date of Birth: 19-Jan-1956 Admit Type: Outpatient Age: 61 Room: [facility] OR ROOM 01 Gender: Male Note Status: Finalized Procedure:            Upper GI endoscopy Indications:          Weight loss Providers:            Lucilla Lame MD, MD Medicines:            Propofol per Anesthesia Complications:        No immediate complications. Procedure:            Pre-Anesthesia Assessment:                       - Prior to the procedure, a History and Physical was                        performed, and patient medications and allergies were                        reviewed. The patient's tolerance of previous                        anesthesia was also reviewed. The risks and benefits of                        the procedure and the sedation options and risks were                        discussed with the patient. All questions were                        answered, and informed consent was obtained. Prior                        Anticoagulants: The patient has taken no previous                        anticoagulant or antiplatelet agents. ASA Grade                        Assessment: II - A patient with mild systemic disease.                        After reviewing the risks and benefits, the patient was                        deemed in satisfactory condition to undergo the                        procedure.                       After obtaining informed consent, the endoscope was                        passed under direct vision. Throughout the procedure,                        the patient's blood pressure, pulse,  and oxygen                        saturations were monitored continuously. The Olympus                        [id number]-[id number] Endoscope (S#. [id number]) was introduced                        through the mouth, and advanced to the second part of           duodenum. The upper GI endoscopy was accomplished                        without difficulty. The patient tolerated the procedure                        well. Findings:      A medium-sized hiatal hernia was present.      Diffuse moderate inflammation characterized by erosions was found in the       entire examined stomach. Biopsies were taken with a cold forceps for       histology.      Localized mild inflammation was found in the duodenal bulb. Impression:           - Medium-sized hiatal hernia.                       - Gastritis. Biopsied.                       - Duodenitis. Recommendation:       - Discharge patient to home.                       - Resume previous diet.                       - Continue present medications.                       - Await pathology results.                       - Perform a colonoscopy today. Procedure Code(s):    --- Professional ---                       [phone], Esophagogastroduodenoscopy, flexible, transoral;                        with biopsy, single or multiple Diagnosis Code(s):    --- Professional ---                       R63.4, Abnormal weight loss                       K29.70, Gastritis, unspecified, without bleeding                       K29.80, Duodenitis without bleeding CPT copyright 2016 American Medical Association. All rights reserved. The codes documented in this report are preliminary and upon coder review may  be revised to meet current compliance requirements. Lucilla Lame MD, MD 03/07/2017 11:02:24 AM This report has been  signed electronically. Number of Addenda: 0 Note Initiated On: 03/07/2017 10:45 AM      [facility]

## 2017-03-11 ENCOUNTER — Encounter: Payer: Self-pay | Admitting: Gastroenterology

## 2017-04-10 ENCOUNTER — Other Ambulatory Visit: Payer: Self-pay | Admitting: Family Medicine

## 2017-04-10 MED ORDER — TAMSULOSIN HCL 0.4 MG PO CAPS: 0.4000 mg | ORAL_CAPSULE | Freq: Every day | ORAL | 3 refills | Status: DC

## 2017-04-24 ENCOUNTER — Other Ambulatory Visit: Payer: Self-pay

## 2017-04-24 MED ORDER — TAMSULOSIN HCL 0.4 MG PO CAPS: 0.4000 mg | ORAL_CAPSULE | Freq: Every day | ORAL | 11 refills | Status: DC

## 2017-05-16 ENCOUNTER — Telehealth: Payer: Self-pay | Admitting: *Deleted

## 2017-05-16 ENCOUNTER — Inpatient Hospital Stay: Payer: BLUE CROSS/BLUE SHIELD | Attending: Hematology and Oncology

## 2017-05-16 NOTE — Telephone Encounter (Addendum)
Patient came to clinic for weight check. 155.3#.  At last visit patient weighed 152.  3# weight gain.

## 2017-07-21 ENCOUNTER — Other Ambulatory Visit: Payer: Self-pay | Admitting: *Deleted

## 2017-07-21 DIAGNOSIS — C159 Malignant neoplasm of esophagus, unspecified: Secondary | ICD-10-CM

## 2017-07-21 MED ORDER — LEVOTHYROXINE SODIUM 112 MCG PO TABS: 112.0000 ug | ORAL_TABLET | Freq: Every day | ORAL | 0 refills | Status: DC

## 2017-07-31 ENCOUNTER — Other Ambulatory Visit: Payer: Self-pay | Admitting: *Deleted

## 2017-07-31 NOTE — Telephone Encounter (Signed)
THis was filled for a 90 day supply on 07/21/17

## 2017-08-15 ENCOUNTER — Ambulatory Visit: Payer: BLUE CROSS/BLUE SHIELD | Admitting: Urgent Care

## 2017-08-15 ENCOUNTER — Other Ambulatory Visit: Payer: BLUE CROSS/BLUE SHIELD

## 2017-08-19 IMAGING — CT NM PET TUM IMG RESTAG (PS) SKULL BASE T - THIGH
1 of 9 series · 1 of 25 positions shown · non-contrast
Comparison: PET-CT 01/04/2014

CLINICAL DATA: Subsequent encounter. treatment strategy for head
neck carcinoma. Squamous cell carcinoma the epiglottis.

EXAM:
NUCLEAR MEDICINE PET SKULL BASE TO THIGH
TECHNIQUE: 12.4 mCi F-18 FDG was injected intravenously. Full-ring PET imaging
was performed from the skull base to thigh after the radiotracer. CT
data was obtained and used for attenuation correction and anatomic
localization.
FASTING BLOOD GLUCOSE:  Value: 111 mg/dl

[Series 4: ct wb 5.0 b30f · axial · 5.0mm · 0.98mm/px · 1 of 329 slices shown]
[im 329/329  brain]
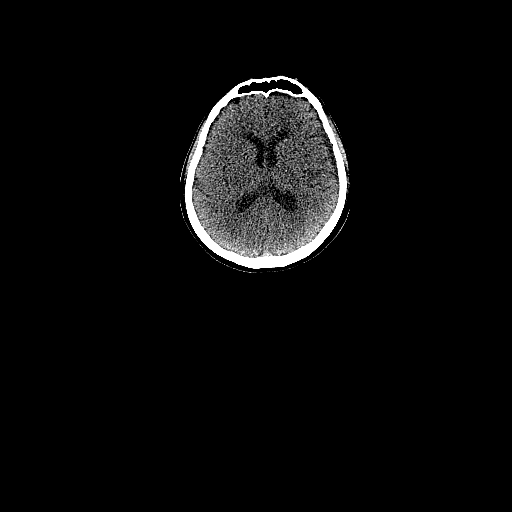

[1 of 25 positions shown; findings below may reference images not displayed]

FINDINGS: NECK

Symmetric uptake in the the posterior oral pharynx along the base
the tongue. Symmetric uptake in the posterior nasopharynx. Normal
uptake within the epiglottis. No hypermetabolic cervical lymph
nodes. Physiologic activity noted in the strap muscles of neck.

CHEST

No hypermetabolic mediastinal or hilar nodes. No suspicious
pulmonary nodules on the CT scan.

ABDOMEN/PELVIS

No abnormal hypermetabolic activity within the liver, pancreas,
adrenal glands, or spleen. No hypermetabolic lymph nodes in the
abdomen or pelvis. The gallstones within a collapsed gallbladder.

SKELETON

No focal hypermetabolic activity to suggest skeletal metastasis.
IMPRESSION: 1. No evidence of head neck cancer recurrence.  Normal epiglottis.
2. No evidence of metabolic adenopathy in the neck.
3. No distant metastasis.

## 2017-08-29 ENCOUNTER — Other Ambulatory Visit: Payer: Self-pay | Admitting: *Deleted

## 2017-08-29 DIAGNOSIS — C329 Malignant neoplasm of larynx, unspecified: Secondary | ICD-10-CM

## 2017-08-31 NOTE — Progress Notes (Signed)
[facility] day:  09/01/2017   Chief Complaint: Benjamin Torres is a 62 y.o. 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   HPI:  The patient was last seen in the medical oncology clinic on 02/14/2017.  At that time, he was doing well. He complained of difficulty initiating urinary stream at times. He was voiding small amounts and experiencing nocturia. Weight was stable. Noted early satiety. He was referred to dietician, urology, and gastroenterology.   Patient seen in consult on 02/25/2017 by Dr. Lucilla Lame for further evaluation of his progressive weight loss. Given is complaints of early satiety and past history of laryngeal cancer, patient was scheduled for an EGD and colonoscopy.  He underwent routine EGD and colonoscopy on 03/07/2017 with Dr. Allen Norris. EGD revealed a medium sized hiatal hernia, moderate inflammation in the stomach, and mild inflammation in the duodenal bulb. Findings c/w gastritis and duodenitis. Pathology demonstrated chronic inactive gastritis. Sample negative for H.pylori.   Colonoscopy revealed two 4-5 mm sessile polyps in the descending colon, one 2 mm polyp in the sigmoid colon, diverticulosis, and non-bleeding internal hemorrhoids. Pathology showed that the 2 polyps from the descending colon were tubular adenomas, and the lesion from the sigmoid was a hyperplastic polyp.   Patient seen in consult on 03/06/2017 by Dr. John Giovanni (urology) for evaluation of urinary symptoms. Bladder scan demonstrated a PVR of 59 mL.  PET scan on 02/12/2017 revealed no evidence of metastatic disease.  There was an enlarging posterior bladder diverticulum and mild prostatomegaly. Tamsulosin trial was recommended, however patient initially refused citing that his LUTS were not significant to warrant treatment. Patient finally agreed. Patient scheduled for a 1 year follow up with urology.   Patient  returned to the medical oncology clinic on 05/16/2017 for a weight check. Weight at that time was 155.3, which reflected a 3 pound weight gain since his last visit to the clinic.   In the interim, patient is doing well today. There are no acute concerns. Patient denies B symptoms and interval infections. LUTS have improved on Flomax. Patient's appetite is "ok". He states, "my wife won't cook for me. That is the problem". Patient eating out at least 4 out of 7 days in a week. Weight has decreased by 2 pounds in January.  He continues to note early satiety.   Patient denies pain in the clinic today.    Past Medical History:  Diagnosis Date  . Cancer of larynx ([place]) 11/06/2014  . COPD (chronic obstructive pulmonary disease) ([place])   . Hypothyroidism   . PE (pulmonary thromboembolism) ([place])    right lung, over 10 yrs ago    Past Surgical History:  Procedure Laterality Date  . ANAL FISSURE REPAIR    . APPENDECTOMY    . BIOPSY PHARYNX  2015   Larynx BX  . COLONOSCOPY WITH PROPOFOL N/A 03/07/2017   Procedure: COLONOSCOPY WITH PROPOFOL;  Surgeon: Lucilla Lame, MD;  Location: [facility];  Service: Endoscopy;  Laterality: N/A;  . ESOPHAGOGASTRODUODENOSCOPY (EGD) WITH PROPOFOL N/A 03/07/2017   Procedure: ESOPHAGOGASTRODUODENOSCOPY (EGD) WITH PROPOFOL;  Surgeon: Lucilla Lame, MD;  Location: [facility];  Service: Endoscopy;  Laterality: N/A;  . POLYPECTOMY  03/07/2017   Procedure: POLYPECTOMY;  Surgeon: Lucilla Lame, MD;  Location: [facility];  Service: Endoscopy;;    No family history on file.  Social History:  reports that he quit smoking about 4  years ago. He has never used smokeless tobacco. He reports that he does not drink alcohol. His drug history is not on file.  He likes to ride motorcycles.  He likes to play soft ball.  He works a Armed forces logistics/support/administrative officer.    Allergies: No Known Allergies  Current Medications: Current Outpatient Medications  Medication Sig Dispense Refill   . albuterol (PROVENTIL HFA;VENTOLIN HFA) 108 (90 Base) MCG/ACT inhaler Inhale 2 puffs every 6 (six) hours as needed into the lungs for wheezing or shortness of breath.    . levothyroxine (SYNTHROID, LEVOTHROID) 112 MCG tablet Take 1 tablet (112 mcg total) by mouth daily before breakfast. 90 tablet 0  . tamsulosin (FLOMAX) 0.4 MG CAPS capsule Take 1 capsule (0.4 mg total) by mouth daily. 30 capsule 11   No current facility-administered medications for this visit.     Review of Systems  Constitutional: Positive for weight loss (total of 24 pound weight loss since 05/2016). Negative for diaphoresis, fever and malaise/fatigue.  HENT: Negative.  Negative for nosebleeds and sore throat.        Xerostomia; chronic; not using SalivaMax  Eyes: Negative.   Respiratory: Positive for shortness of breath (mostly exertional;"gives out of breath easily"). Negative for cough, hemoptysis and sputum production.   Cardiovascular: Negative for chest pain, palpitations, orthopnea, leg swelling and PND.  Gastrointestinal: Negative for abdominal pain, blood in stool, constipation, diarrhea, melena, nausea and vomiting.  Genitourinary: Negative for dysuria, frequency, hematuria and urgency.       LUTS; difficulty initiating stream, voids small amounts, nocturia. Known prostatomegaly and posterior bladder diverticulum; sees urology  Musculoskeletal: Negative for back pain, falls, joint pain and myalgias.  Skin: Negative for itching and rash.  Neurological: Negative for dizziness, tremors, weakness and headaches.  Endo/Heme/Allergies: Does not bruise/bleed easily.       HYPOthyroidism on levothyroxine  Psychiatric/Behavioral: Negative for depression, memory loss and suicidal ideas. The patient is not nervous/anxious and does not have insomnia.   All other systems reviewed and are negative.  Physical Exam: Blood pressure (!) 149/90, pulse 70, temperature 97.7 F (36.5 C), temperature source Tympanic, resp. rate  18, weight 153 lb 4 oz (69.5 kg). GENERAL:  Well developed, well nourished, gentleman sitting comfortably in the exam room in no acute distress. MENTAL STATUS:  Alert and oriented to person, place and time. HEAD:  Thin gray hair and mustache.  Normocephalic, atraumatic, face symmetric, no Cushingoid features. EYES:  Brown eyes.  Pupils equal round and reactive to light and accomodation.  No conjunctivitis or scleral icterus. ENT:  Oropharynx clear without lesion.  Tongue normal. Mucous membranes moist.  RESPIRATORY:  Clear to auscultation without rales, wheezes or rhonchi. CARDIOVASCULAR:  Regular rate and rhythm without murmur, rub or gallop. ABDOMEN:  Soft, non-tender, with active bowel sounds, and no hepatosplenomegaly.  No masses. SKIN:  Tattoo.  No rashes, ulcers or lesions. EXTREMITIES: No edema, no skin discoloration or tenderness.  No palpable cords. LYMPH NODES: No palpable cervical, supraclavicular, axillary or inguinal adenopathy  NEUROLOGICAL: Unremarkable. PSYCH:  Appropriate.    Orders Only on 09/01/2017  Component Date Value Ref Range Status  . Sodium 09/01/2017 136  135 - 145 mmol/L Final  . Potassium 09/01/2017 4.2  3.5 - 5.1 mmol/L Final  . Chloride 09/01/2017 104  101 - 111 mmol/L Final  . CO2 09/01/2017 24  22 - 32 mmol/L Final  . Glucose, Bld 09/01/2017 87  65 - 99 mg/dL Final  . BUN 09/01/2017 14  6 -  20 mg/dL Final  . Creatinine, Ser 09/01/2017 0.81  0.61 - 1.24 mg/dL Final  . Calcium 09/01/2017 9.0  8.9 - 10.3 mg/dL Final  . Total Protein 09/01/2017 7.1  6.5 - 8.1 g/dL Final  . Albumin 09/01/2017 3.9  3.5 - 5.0 g/dL Final  . AST 09/01/2017 16  15 - 41 U/L Final  . ALT 09/01/2017 12* 17 - 63 U/L Final  . Alkaline Phosphatase 09/01/2017 55  38 - 126 U/L Final  . Total Bilirubin 09/01/2017 0.4  0.3 - 1.2 mg/dL Final  . GFR calc non Af Amer 09/01/2017 >60  >60 mL/min Final  . GFR calc Af Amer 09/01/2017 >60  >60 mL/min Final   Comment: (NOTE) The eGFR has been  calculated using the CKD EPI equation. This calculation has not been validated in all clinical situations. eGFR's persistently <60 mL/min signify possible Chronic Kidney Disease.   Georgiann Hahn gap 09/01/2017 8  5 - 15 Final   Performed at [facility], [address] [place], [place] [address]  . WBC 09/01/2017 7.1  3.8 - 10.6 K/uL Final  . RBC 09/01/2017 4.65  4.40 - 5.90 MIL/uL Final  . Hemoglobin 09/01/2017 13.9  13.0 - 18.0 g/dL Final  . HCT 09/01/2017 40.8  40.0 - 52.0 % Final  . MCV 09/01/2017 87.8  80.0 - 100.0 fL Final  . MCH 09/01/2017 30.0  26.0 - 34.0 pg Final  . MCHC 09/01/2017 34.1  32.0 - 36.0 g/dL Final  . RDW 09/01/2017 14.0  11.5 - 14.5 % Final  . Platelets 09/01/2017 205  150 - 440 K/uL Final  . Neutrophils Relative % 09/01/2017 70  % Final  . Neutro Abs 09/01/2017 5.0  1.4 - 6.5 K/uL Final  . Lymphocytes Relative 09/01/2017 18  % Final  . Lymphs Abs 09/01/2017 1.3  1.0 - 3.6 K/uL Final  . Monocytes Relative 09/01/2017 9  % Final  . Monocytes Absolute 09/01/2017 0.6  0.2 - 1.0 K/uL Final  . Eosinophils Relative 09/01/2017 2  % Final  . Eosinophils Absolute 09/01/2017 0.1  0 - 0.7 K/uL Final  . Basophils Relative 09/01/2017 1  % Final  . Basophils Absolute 09/01/2017 0.0  0 - 0.1 K/uL Final   Performed at [facility], [address], [place], [place] [address]    Assessment:  Benjamin Torres is a 62 y.o. male with stage III supraglottic larynx cancer s/p concurrent cetuximab and radiation.  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 PET scan on 08/18/2013 revealed hypermetabolic soft tissue lesion in the right hypopharynx consistent with epiglottic lesion. There also was a high right paratracheal lymph node which was also hypermetabolic and suspicious for metastatic disease.  Clinical stage was T2N1M0.  He was treated with radiation therapy and cetuximab beginning 09/07/2013.  He received 7,000 cGy from 09/07/2013 -  11/10/2013.    PET scan on 06/21/2015 revealed no evidence of recurrent disease.  PET scan on 02/12/2017 revealed no evidence of hypermetabolic locoregional or distant metastatic disease.  ENT evaluation in 11/2016 was negative per patient.  He has hypothyroidism.  He is on Synthroid.  He has a posterior bladder diverticulum and prostatomegaly, which causes him to experience LUTS. PSA was 1.91 on 01/13/2017.  EGD on 03/07/2017 revealed a medium sized hiatal hernia, moderate inflammation in the stomach, and mild inflammation in the duodenal bulb. Findings c/w gastritis and duodenitis. Pathology demonstrated chronic inactive gastritis. Negative for H.pylori.   Colonoscopy on 03/07/2017 revealed  two 4-5 mm sessile polyps in the descending colon, one 2 mm polyp in the sigmoid colon, diverticulosis, and non-bleeding internal hemorrhoids. Pathology showed that the 2 polyps from the descending colon were tubular adenomas, and the lesion from the sigmoid was a hyperplastic polyp.   Symptomatically, he feels "ok". He denies acute concerns. Patient eating "ok". Weight is stable.  He is comfortable at his current weight.  Exam is stable.   Plan: 1.  Labs today: CBC with diff, CMP, TSH 2.  Review EGD - hiatal hernia noted. Direct visualization showed findings c/w gastritis and duodenitis. Pathology negative for malignancy.  3.  Review colonoscopy - two 4-5 mm sessile polyps in the descending colon (tubular adenoma), one polyp in the sigmoid colon (hyperplastic polyp), diverticulosis, and internal non-bleeding hemorrhoids.   4.  Discuss weight loss. Weight today is 153 pounds, which is down 2 pounds since January 2019. Discuss evaluation by cancer center dietician. Patient refuses citing that he is comfortable at current weight.   5.  Continue to follow up with urology as scheduled for continued monitoring of bladder diverticulum and PSA monitoring. 6.  Schedule PET scan in 6 months.  7.  RTC in 6 months for  MD assessment, labs (CBC with diff, CMP, TSH).   Honor Loh, NP  09/01/2017, 3:53 PM   I saw and evaluated the patient, participating in the key portions of the service and reviewing pertinent diagnostic studies and records.  I reviewed the nurse practitioner's note and agree with the findings and the plan.  The assessment and plan were discussed with the patient.  A few questions were asked by the patient and answered.   Nolon Stalls, MD 09/01/2017,3:53 PM

## 2017-09-01 ENCOUNTER — Inpatient Hospital Stay: Payer: Self-pay | Attending: Hematology and Oncology

## 2017-09-01 ENCOUNTER — Inpatient Hospital Stay (HOSPITAL_BASED_OUTPATIENT_CLINIC_OR_DEPARTMENT_OTHER): Payer: Self-pay | Admitting: Hematology and Oncology

## 2017-09-01 ENCOUNTER — Other Ambulatory Visit: Payer: Self-pay

## 2017-09-01 VITALS — BP 149/90 | HR 70 | Temp 97.7°F | Resp 18 | Wt 153.2 lb

## 2017-09-01 DIAGNOSIS — Z87891 Personal history of nicotine dependence: Secondary | ICD-10-CM | POA: Insufficient documentation

## 2017-09-01 DIAGNOSIS — Z9221 Personal history of antineoplastic chemotherapy: Secondary | ICD-10-CM | POA: Insufficient documentation

## 2017-09-01 DIAGNOSIS — R6881 Early satiety: Secondary | ICD-10-CM

## 2017-09-01 DIAGNOSIS — Z923 Personal history of irradiation: Secondary | ICD-10-CM | POA: Insufficient documentation

## 2017-09-01 DIAGNOSIS — R7989 Other specified abnormal findings of blood chemistry: Secondary | ICD-10-CM

## 2017-09-01 DIAGNOSIS — K449 Diaphragmatic hernia without obstruction or gangrene: Secondary | ICD-10-CM

## 2017-09-01 DIAGNOSIS — N323 Diverticulum of bladder: Secondary | ICD-10-CM | POA: Insufficient documentation

## 2017-09-01 DIAGNOSIS — E039 Hypothyroidism, unspecified: Secondary | ICD-10-CM

## 2017-09-01 DIAGNOSIS — Z8521 Personal history of malignant neoplasm of larynx: Secondary | ICD-10-CM | POA: Insufficient documentation

## 2017-09-01 DIAGNOSIS — N400 Enlarged prostate without lower urinary tract symptoms: Secondary | ICD-10-CM | POA: Insufficient documentation

## 2017-09-01 DIAGNOSIS — C329 Malignant neoplasm of larynx, unspecified: Secondary | ICD-10-CM

## 2017-09-01 DIAGNOSIS — E038 Other specified hypothyroidism: Secondary | ICD-10-CM

## 2017-09-01 DIAGNOSIS — K2950 Unspecified chronic gastritis without bleeding: Secondary | ICD-10-CM | POA: Insufficient documentation

## 2017-09-01 DIAGNOSIS — R634 Abnormal weight loss: Secondary | ICD-10-CM | POA: Insufficient documentation

## 2017-09-01 LAB — CBC WITH DIFFERENTIAL/PLATELET
Basophils Absolute: 0 10*3/uL (ref 0–0.1)
Basophils Relative: 1 %
Eosinophils Absolute: 0.1 10*3/uL (ref 0–0.7)
Eosinophils Relative: 2 %
HCT: 40.8 % (ref 40.0–52.0)
Hemoglobin: 13.9 g/dL (ref 13.0–18.0)
Lymphocytes Relative: 18 %
Lymphs Abs: 1.3 10*3/uL (ref 1.0–3.6)
MCH: 30 pg (ref 26.0–34.0)
MCHC: 34.1 g/dL (ref 32.0–36.0)
MCV: 87.8 fL (ref 80.0–100.0)
Monocytes Absolute: 0.6 10*3/uL (ref 0.2–1.0)
Monocytes Relative: 9 %
Neutro Abs: 5 10*3/uL (ref 1.4–6.5)
Neutrophils Relative %: 70 %
Platelets: 205 10*3/uL (ref 150–440)
RBC: 4.65 MIL/uL (ref 4.40–5.90)
RDW: 14 % (ref 11.5–14.5)
WBC: 7.1 10*3/uL (ref 3.8–10.6)

## 2017-09-01 LAB — TSH: TSH: 0.106 u[IU]/mL — ABNORMAL LOW (ref 0.350–4.500)

## 2017-09-01 LAB — COMPREHENSIVE METABOLIC PANEL
ALT: 12 U/L — ABNORMAL LOW (ref 17–63)
AST: 16 U/L (ref 15–41)
Albumin: 3.9 g/dL (ref 3.5–5.0)
Alkaline Phosphatase: 55 U/L (ref 38–126)
Anion gap: 8 (ref 5–15)
BUN: 14 mg/dL (ref 6–20)
CO2: 24 mmol/L (ref 22–32)
Calcium: 9 mg/dL (ref 8.9–10.3)
Chloride: 104 mmol/L (ref 101–111)
Creatinine, Ser: 0.81 mg/dL (ref 0.61–1.24)
GFR calc Af Amer: >60 mL/min (ref >60)
GFR calc non Af Amer: >60 mL/min (ref >60)
Glucose, Bld: 87 mg/dL (ref 65–99)
Potassium: 4.2 mmol/L (ref 3.5–5.1)
Sodium: 136 mmol/L (ref 135–145)
Total Bilirubin: 0.4 mg/dL (ref 0.3–1.2)
Total Protein: 7.1 g/dL (ref 6.5–8.1)

## 2017-09-01 NOTE — Progress Notes (Signed)
Patient offers no complaints today. 

## 2017-09-02 ENCOUNTER — Other Ambulatory Visit: Payer: Self-pay

## 2017-09-02 DIAGNOSIS — R7989 Other specified abnormal findings of blood chemistry: Secondary | ICD-10-CM

## 2017-09-02 LAB — T4, FREE: Free T4: 1.07 ng/dL (ref 0.82–1.77)

## 2017-09-07 ENCOUNTER — Encounter: Payer: Self-pay | Admitting: Hematology and Oncology

## 2017-10-24 ENCOUNTER — Other Ambulatory Visit: Payer: Self-pay | Admitting: Hematology and Oncology

## 2017-10-24 DIAGNOSIS — C159 Malignant neoplasm of esophagus, unspecified: Secondary | ICD-10-CM

## 2017-10-24 NOTE — Telephone Encounter (Signed)
Dx:  Cancer of larynx ([place])   Ref Range & Units 84mo ago  TSH 0.350 - 4.500 uIU/mL 0.106Low

## 2017-11-07 ENCOUNTER — Other Ambulatory Visit: Payer: Self-pay | Admitting: Hematology and Oncology

## 2017-11-07 DIAGNOSIS — C159 Malignant neoplasm of esophagus, unspecified: Secondary | ICD-10-CM

## 2017-12-12 ENCOUNTER — Other Ambulatory Visit: Payer: Self-pay | Admitting: *Deleted

## 2017-12-12 DIAGNOSIS — C159 Malignant neoplasm of esophagus, unspecified: Secondary | ICD-10-CM

## 2017-12-12 MED ORDER — LEVOTHYROXINE SODIUM 112 MCG PO TABS: 112.0000 ug | ORAL_TABLET | Freq: Every day | ORAL | 0 refills | Status: DC

## 2017-12-12 NOTE — Telephone Encounter (Signed)
   Ref Range & Units 16mo ago  Free T4 0.82 - 1.77 ng/dL 1.07        )   Ref Range & Units 49mo ago  TSH 0.350 - 4.500 uIU/mL 0.106Low

## 2018-03-02 ENCOUNTER — Telehealth: Payer: Self-pay | Admitting: *Deleted

## 2018-03-02 NOTE — Telephone Encounter (Signed)
Per Velna Hatchet 03/02/18 staff message to cancel PET scheduled for 03/04/18 PET was cancelled as requested  I tried to call patient x5 to make him aware However, I was unable  to reach him. A detailed message was left on his vmail to make him aware that the PET had been cancelled for 03/04/18

## 2018-03-03 ENCOUNTER — Telehealth: Payer: Self-pay | Admitting: *Deleted

## 2018-03-03 NOTE — Telephone Encounter (Signed)
Called patient again @ 2:13 to make him aware that his PET scheduled on  03/04/18 @ 9:30 had been cancelled. Another message was left of patient's vmail.

## 2018-03-06 ENCOUNTER — Ambulatory Visit: Payer: 59 | Admitting: Urology

## 2018-03-09 ENCOUNTER — Inpatient Hospital Stay: Payer: 59 | Admitting: Hematology and Oncology

## 2018-03-09 ENCOUNTER — Inpatient Hospital Stay: Payer: 59

## 2018-03-09 NOTE — Progress Notes (Deleted)
[name] day:  03/09/2018   Chief Complaint: Benjamin Torres is a 62 y.o. 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   HPI:  The patient was last seen in the medical oncology clinic on 09/01/2017.  At that time, he felt "ok". He denied acute concerns. Patient was eating "ok". Weight was stable.  He was comfortable at his current weight.  Exam was stable.    PET scan ordered but cancelled due to insurance approval.  During the interim,   Past Medical History:  Diagnosis Date  . Cancer of larynx ([place]) 11/06/2014  . COPD (chronic obstructive pulmonary disease) ([place])   . Hypothyroidism   . PE (pulmonary thromboembolism) ([place])    right lung, over 10 yrs ago    Past Surgical History:  Procedure Laterality Date  . ANAL FISSURE REPAIR    . APPENDECTOMY    . BIOPSY PHARYNX  2015   Larynx BX  . COLONOSCOPY WITH PROPOFOL N/A 03/07/2017   Procedure: COLONOSCOPY WITH PROPOFOL;  Surgeon: Lucilla Lame, MD;  Location: [facility];  Service: Endoscopy;  Laterality: N/A;  . ESOPHAGOGASTRODUODENOSCOPY (EGD) WITH PROPOFOL N/A 03/07/2017   Procedure: ESOPHAGOGASTRODUODENOSCOPY (EGD) WITH PROPOFOL;  Surgeon: Lucilla Lame, MD;  Location: [facility];  Service: Endoscopy;  Laterality: N/A;  . POLYPECTOMY  03/07/2017   Procedure: POLYPECTOMY;  Surgeon: Lucilla Lame, MD;  Location: [facility];  Service: Endoscopy;;    No family history on file.  Social History:  reports that he quit smoking about 4 years ago. He has never used smokeless tobacco. He reports that he does not drink alcohol. His drug history is not on file.  He likes to ride motorcycles.  He likes to play soft ball.  He works a Armed forces logistics/support/administrative officer.    Allergies: No Known Allergies  Current Medications: Current Outpatient Medications  Medication Sig Dispense Refill  . albuterol (PROVENTIL HFA;VENTOLIN HFA) 108 (90 Base) MCG/ACT  inhaler Inhale 2 puffs every 6 (six) hours as needed into the lungs for wheezing or shortness of breath.    . levothyroxine (SYNTHROID, LEVOTHROID) 112 MCG tablet Take 1 tablet (112 mcg total) by mouth daily before breakfast. 90 tablet 0  . tamsulosin (FLOMAX) 0.4 MG CAPS capsule Take 1 capsule (0.4 mg total) by mouth daily. 30 capsule 11   No current facility-administered medications for this visit.     Review of Systems  Constitutional: Positive for weight loss (total of 24 pound weight loss since 05/2016). Negative for diaphoresis, fever and malaise/fatigue.  HENT: Negative.  Negative for nosebleeds and sore throat.        Xerostomia; chronic; not using SalivaMax  Eyes: Negative.   Respiratory: Positive for shortness of breath (mostly exertional;"gives out of breath easily"). Negative for cough, hemoptysis and sputum production.   Cardiovascular: Negative for chest pain, palpitations, orthopnea, leg swelling and PND.  Gastrointestinal: Negative for abdominal pain, blood in stool, constipation, diarrhea, melena, nausea and vomiting.  Genitourinary: Negative for dysuria, frequency, hematuria and urgency.       LUTS; difficulty initiating stream, voids small amounts, nocturia. Known prostatomegaly and posterior bladder diverticulum; sees urology  Musculoskeletal: Negative for back pain, falls, joint pain and myalgias.  Skin: Negative for itching and rash.  Neurological: Negative for dizziness, tremors, weakness and headaches.  Endo/Heme/Allergies: Does not bruise/bleed easily.       HYPOthyroidism on levothyroxine  Psychiatric/Behavioral: Negative for depression, memory  loss and suicidal ideas. The patient is not nervous/anxious and does not have insomnia.   All other systems reviewed and are negative.  Physical Exam: There were no vitals taken for this visit. GENERAL:  Well developed, well nourished, gentleman sitting comfortably in the exam room in no acute distress. MENTAL STATUS:   Alert and oriented to person, place and time. HEAD:  Thin gray hair and mustache.  Normocephalic, atraumatic, face symmetric, no Cushingoid features. EYES:  Brown eyes.  Pupils equal round and reactive to light and accomodation.  No conjunctivitis or scleral icterus. ENT:  Oropharynx clear without lesion.  Tongue normal. Mucous membranes moist.  RESPIRATORY:  Clear to auscultation without rales, wheezes or rhonchi. CARDIOVASCULAR:  Regular rate and rhythm without murmur, rub or gallop. ABDOMEN:  Soft, non-tender, with active bowel sounds, and no hepatosplenomegaly.  No masses. SKIN:  Tattoo.  No rashes, ulcers or lesions. EXTREMITIES: No edema, no skin discoloration or tenderness.  No palpable cords. LYMPH NODES: No palpable cervical, supraclavicular, axillary or inguinal adenopathy  NEUROLOGICAL: Unremarkable. PSYCH:  Appropriate.    No visits with results within 3 Day(s) from this visit.  Latest known visit with results is:  Orders Only on 09/02/2017  Component Date Value Ref Range Status  . Free T4 09/02/2017 1.07  0.82 - 1.77 ng/dL Final   Comment: (NOTE) Biotin ingestion may interfere with free T4 tests. If the results are inconsistent with the TSH level, previous test results, or the clinical presentation, then consider biotin interference. If needed, order repeat testing after stopping biotin. Performed at [facility], [address] [place], [place] [address]     Assessment:  Benjamin Torres is a 62 y.o. male with stage III supraglottic larynx cancer s/p concurrent cetuximab and radiation.  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 PET scan on 08/18/2013 revealed hypermetabolic soft tissue lesion in the right hypopharynx consistent with epiglottic lesion. There also was a high right paratracheal lymph node which was also hypermetabolic and suspicious for metastatic disease.  Clinical stage was T2N1M0.  He was treated with radiation  therapy and cetuximab beginning 09/07/2013.  He received 7,000 cGy from 09/07/2013 - 11/10/2013.    PET scan on 06/21/2015 revealed no evidence of recurrent disease.  PET scan on 02/12/2017 revealed no evidence of hypermetabolic locoregional or distant metastatic disease.  ENT evaluation in 11/2016 was negative per patient.  He has hypothyroidism.  He is on Synthroid.  He has a posterior bladder diverticulum and prostatomegaly, which causes him to experience LUTS. PSA was 1.91 on 01/13/2017.  EGD on 03/07/2017 revealed a medium sized hiatal hernia, moderate inflammation in the stomach, and mild inflammation in the duodenal bulb. Findings c/w gastritis and duodenitis. Pathology demonstrated chronic inactive gastritis. Negative for H.pylori.   Colonoscopy on 03/07/2017 revealed two 4-5 mm sessile polyps in the descending colon, one 2 mm polyp in the sigmoid colon, diverticulosis, and non-bleeding internal hemorrhoids. Pathology showed that the 2 polyps from the descending colon were tubular adenomas, and the lesion from the sigmoid was a hyperplastic polyp.   Symptomatically,  he feels "ok". He denies acute concerns. Patient eating "ok". Weight is stable.  He is comfortable at his current weight.  Exam is stable.   Plan: 1.  Labs today:  CBC with diff, CMP, TSH. 2.  Stage III supraglottic larynx cancer:  He is 4 1/2 years s/p completion of concurrent chemotherapy and radiation.  Discuss guidelines for follow-up  Imaging based on worrisome signs/symptoms,  smoking history, and areas inaccessible to direct visualization.   Once 5 years post treatment, discuss annual surveillance.  2.  Review EGD - hiatal hernia noted. Direct visualization showed findings c/w gastritis and duodenitis. Pathology negative for malignancy.  3.  Review colonoscopy - two 4-5 mm sessile polyps in the descending colon (tubular adenoma), one polyp in the sigmoid colon (hyperplastic polyp), diverticulosis, and internal  non-bleeding hemorrhoids.   4.  Discuss weight loss. Weight today is 153 pounds, which is down 2 pounds since January 2019. Discuss evaluation by cancer center dietician. Patient refuses citing that he is comfortable at current weight.   5.  Continue to follow up with urology as scheduled for continued monitoring of bladder diverticulum and PSA monitoring. 6.  Schedule PET scan in 6 months.  7.  RTC in 6 months for MD assessment, labs (CBC with diff, CMP, TSH).   Lequita Asal, MD  03/09/2018, 5:02 AM   I saw and evaluated the patient, participating in the key portions of the service and reviewing pertinent diagnostic studies and records.  I reviewed the nurse practitioner's note and agree with the findings and the plan.  The assessment and plan were discussed with the patient.  A few questions were asked by the patient and answered.   Nolon Stalls, MD 03/09/2018,5:02 AM

## 2018-03-13 ENCOUNTER — Other Ambulatory Visit: Payer: Self-pay

## 2018-03-13 ENCOUNTER — Ambulatory Visit: Payer: Self-pay | Admitting: Hematology and Oncology

## 2018-03-20 ENCOUNTER — Inpatient Hospital Stay (HOSPITAL_BASED_OUTPATIENT_CLINIC_OR_DEPARTMENT_OTHER): Payer: 59 | Admitting: Hematology and Oncology

## 2018-03-20 ENCOUNTER — Inpatient Hospital Stay: Payer: 59 | Attending: Hematology and Oncology

## 2018-03-20 ENCOUNTER — Encounter: Payer: Self-pay | Admitting: Hematology and Oncology

## 2018-03-20 ENCOUNTER — Telehealth: Payer: Self-pay | Admitting: *Deleted

## 2018-03-20 VITALS — BP 125/82 | HR 65 | Temp 98.2°F | Resp 18 | Wt 154.4 lb

## 2018-03-20 DIAGNOSIS — Z8521 Personal history of malignant neoplasm of larynx: Secondary | ICD-10-CM

## 2018-03-20 DIAGNOSIS — E039 Hypothyroidism, unspecified: Secondary | ICD-10-CM | POA: Diagnosis not present

## 2018-03-20 DIAGNOSIS — Z87891 Personal history of nicotine dependence: Secondary | ICD-10-CM | POA: Diagnosis not present

## 2018-03-20 DIAGNOSIS — R7989 Other specified abnormal findings of blood chemistry: Secondary | ICD-10-CM

## 2018-03-20 DIAGNOSIS — Z923 Personal history of irradiation: Secondary | ICD-10-CM

## 2018-03-20 DIAGNOSIS — Z79899 Other long term (current) drug therapy: Secondary | ICD-10-CM | POA: Insufficient documentation

## 2018-03-20 DIAGNOSIS — K117 Disturbances of salivary secretion: Secondary | ICD-10-CM

## 2018-03-20 DIAGNOSIS — C329 Malignant neoplasm of larynx, unspecified: Secondary | ICD-10-CM

## 2018-03-20 DIAGNOSIS — Z9225 Personal history of immunosupression therapy: Secondary | ICD-10-CM

## 2018-03-20 LAB — COMPREHENSIVE METABOLIC PANEL
ALT: 19 U/L (ref 0–44)
AST: 20 U/L (ref 15–41)
Albumin: 4.2 g/dL (ref 3.5–5.0)
Alkaline Phosphatase: 55 U/L (ref 38–126)
Anion gap: 6 (ref 5–15)
BUN: 13 mg/dL (ref 8–23)
CO2: 30 mmol/L (ref 22–32)
Calcium: 9.2 mg/dL (ref 8.9–10.3)
Chloride: 104 mmol/L (ref 98–111)
Creatinine, Ser: 0.78 mg/dL (ref 0.61–1.24)
GFR calc Af Amer: >60 mL/min (ref >60)
GFR calc non Af Amer: >60 mL/min (ref >60)
Glucose, Bld: 107 mg/dL — ABNORMAL HIGH (ref 70–99)
Potassium: 4.5 mmol/L (ref 3.5–5.1)
Sodium: 140 mmol/L (ref 135–145)
Total Bilirubin: 0.7 mg/dL (ref 0.3–1.2)
Total Protein: 7.3 g/dL (ref 6.5–8.1)

## 2018-03-20 LAB — CBC WITH DIFFERENTIAL/PLATELET
Abs Immature Granulocytes: 0.01 10*3/uL (ref 0.00–0.07)
Basophils Absolute: 0 10*3/uL (ref 0.0–0.1)
Basophils Relative: 1 %
Eosinophils Absolute: 0.2 10*3/uL (ref 0.0–0.5)
Eosinophils Relative: 3 %
HCT: 44.8 % (ref 39.0–52.0)
Hemoglobin: 14.6 g/dL (ref 13.0–17.0)
Immature Granulocytes: 0 %
Lymphocytes Relative: 21 %
Lymphs Abs: 1.3 10*3/uL (ref 0.7–4.0)
MCH: 28.7 pg (ref 26.0–34.0)
MCHC: 32.6 g/dL (ref 30.0–36.0)
MCV: 88 fL (ref 80.0–100.0)
Monocytes Absolute: 0.6 10*3/uL (ref 0.1–1.0)
Monocytes Relative: 10 %
Neutro Abs: 4 10*3/uL (ref 1.7–7.7)
Neutrophils Relative %: 65 %
Platelets: 199 10*3/uL (ref 150–400)
RBC: 5.09 MIL/uL (ref 4.22–5.81)
RDW: 12.8 % (ref 11.5–15.5)
WBC: 6.1 10*3/uL (ref 4.0–10.5)
nRBC: 0 % (ref 0.0–0.2)

## 2018-03-20 LAB — TSH: TSH: 0.255 u[IU]/mL — ABNORMAL LOW (ref 0.350–4.500)

## 2018-03-20 LAB — T4, FREE: Free T4: 0.94 ng/dL (ref 0.82–1.77)

## 2018-03-20 NOTE — Progress Notes (Signed)
Patient offers no complaints today. 

## 2018-03-20 NOTE — Telephone Encounter (Signed)
Received referral for low dose lung cancer screening CT scan. Message left at phone number listed in EMR for patient to call me back to facilitate scheduling scan.  

## 2018-03-20 NOTE — Progress Notes (Signed)
[facility] day:  03/20/2018   Chief Complaint: Benjamin Torres is a 62 y.o. 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   HPI:  The patient was last seen in the medical oncology clinic on 09/01/2017.  At that time, he felt "ok". He denied acute concerns. Patient was eating "ok". Weight was stable.  He was comfortable at his current weight.  Exam was stable.    PET scan ordered but cancelled due to insurance approval.  Patient has a history of tubular adenomas. He has not had any follow up colonoscopies. Patient followed by Dr. Allen Norris.   Patient has not been seen in "awhile" by ENT Tami Ribas, MD).  He states that he lost his job and moved out of town.  He has been released by radiation oncology Baruch Gouty, MD) at this point.   During the interim, patient has been having more breathing issues as of late. He complains of wheezing. Patient states, "I have not seen a doctor about it in awhile". Patient complains of intermittent sore throat. Patient denies that he has experienced any B symptoms. He denies any interval infections.   Patient advises that he maintains an adequate appetite. He is eating well. Weight today is 154 lb 7 oz (70.1 kg), which compared to his last visit to the clinic, represents a 1 pound increase.    Patient denies pain in the clinic today.   Past Medical History:  Diagnosis Date  . Cancer of larynx ([facility]) 11/06/2014  . COPD (chronic obstructive pulmonary disease) ([facility])   . Hypothyroidism   . PE (pulmonary thromboembolism) ([facility])    right lung, over 10 yrs ago    Past Surgical History:  Procedure Laterality Date  . ANAL FISSURE REPAIR    . APPENDECTOMY    . BIOPSY PHARYNX  2015   Larynx BX  . COLONOSCOPY WITH PROPOFOL N/A 03/07/2017   Procedure: COLONOSCOPY WITH PROPOFOL;  Surgeon: Lucilla Lame, MD;  Location: [facility];  Service: Endoscopy;  Laterality: N/A;  .  ESOPHAGOGASTRODUODENOSCOPY (EGD) WITH PROPOFOL N/A 03/07/2017   Procedure: ESOPHAGOGASTRODUODENOSCOPY (EGD) WITH PROPOFOL;  Surgeon: Lucilla Lame, MD;  Location: [facility];  Service: Endoscopy;  Laterality: N/A;  . POLYPECTOMY  03/07/2017   Procedure: POLYPECTOMY;  Surgeon: Lucilla Lame, MD;  Location: [facility];  Service: Endoscopy;;    History reviewed. No pertinent family history.  Social History:  reports that he quit smoking about 4 years ago. He has a 60.00 pack-year smoking history. He has never used smokeless tobacco. He reports that he does not drink alcohol. His drug history is not on file.  He likes to ride motorcycles.  He likes to play soft ball.  He works a Armed forces logistics/support/administrative officer.    Allergies: No Known Allergies  Current Medications: Current Outpatient Medications  Medication Sig Dispense Refill  . albuterol (PROVENTIL HFA;VENTOLIN HFA) 108 (90 Base) MCG/ACT inhaler Inhale 2 puffs every 6 (six) hours as needed into the lungs for wheezing or shortness of breath.    . levothyroxine (SYNTHROID, LEVOTHROID) 112 MCG tablet Take 1 tablet (112 mcg total) by mouth daily before breakfast. 90 tablet 0  . tamsulosin (FLOMAX) 0.4 MG CAPS capsule Take 1 capsule (0.4 mg total) by mouth daily. 30 capsule 11   No current facility-administered medications for this visit.     Review of Systems  Constitutional: Negative.  Negative for chills, diaphoresis, fever, malaise/fatigue and  weight loss (up 1 pound).       Feels "pretty good".  HENT: Negative.  Negative for congestion, ear pain, nosebleeds, sinus pain and sore throat.        Xerostomia; chronic; not using SalivaMax  Eyes: Negative.  Negative for double vision, photophobia and pain.  Respiratory: Positive for shortness of breath and wheezing. Negative for cough, hemoptysis and sputum production.   Cardiovascular: Negative.  Negative for chest pain, palpitations, orthopnea, leg swelling and PND.  Gastrointestinal: Negative.   Negative for abdominal pain, blood in stool, constipation, diarrhea, melena and vomiting.  Genitourinary: Negative.  Negative for dysuria, frequency, hematuria and urgency.       LUTS; difficulty initiating stream, voids small amounts, nocturia. Known prostatomegaly and posterior bladder diverticulum; sees urology  Musculoskeletal: Negative.  Negative for back pain, falls, joint pain, myalgias and neck pain.  Skin: Negative.  Negative for itching and rash.  Neurological: Negative.  Negative for dizziness, tremors, speech change, focal weakness, weakness and headaches.  Endo/Heme/Allergies: Negative.  Does not bruise/bleed easily.       HYPOthyroidism on levothyroxine.  Psychiatric/Behavioral: Negative for depression, memory loss and suicidal ideas. The patient is not nervous/anxious and does not have insomnia.   All other systems reviewed and are negative.  Physical Exam: Blood pressure 125/82, pulse 65, temperature 98.2 F (36.8 C), temperature source Tympanic, resp. rate 18, weight 154 lb 7 oz (70.1 kg). GENERAL:  Well developed, well nourished, gentleman sitting comfortably in the exam room in no acute distress. MENTAL STATUS:  Alert and oriented to person, place and time. HEAD:  Thin gray hair and goatee.  Male pattern baldness.  Normocephalic, atraumatic, face symmetric, no Cushingoid features. EYES:  Brown eyes.  Pupils equal round and reactive to light and accomodation.  No conjunctivitis or scleral icterus. ENT:  Oropharynx clear without lesion.  Tongue normal. Mucous membranes moist.  RESPIRATORY:  Clear to auscultation without rales, wheezes or rhonchi. CARDIOVASCULAR:  Regular rate and rhythm without murmur, rub or gallop. ABDOMEN:  Soft, non-tender, with active bowel sounds, and no hepatosplenomegaly.  No masses. SKIN:  Tattoo.  No rashes, ulcers or lesions. EXTREMITIES: No edema, no skin discoloration or tenderness.  No palpable cords. LYMPH NODES: No palpable cervical,  supraclavicular, axillary or inguinal adenopathy  NEUROLOGICAL: Unremarkable. PSYCH:  Appropriate.    Appointment on 03/20/2018  Component Date Value Ref Range Status  . Sodium 03/20/2018 140  135 - 145 mmol/L Final  . Potassium 03/20/2018 4.5  3.5 - 5.1 mmol/L Final  . Chloride 03/20/2018 104  98 - 111 mmol/L Final  . CO2 03/20/2018 30  22 - 32 mmol/L Final  . Glucose, Bld 03/20/2018 107* 70 - 99 mg/dL Final  . BUN 03/20/2018 13  8 - 23 mg/dL Final  . Creatinine, Ser 03/20/2018 0.78  0.61 - 1.24 mg/dL Final  . Calcium 03/20/2018 9.2  8.9 - 10.3 mg/dL Final  . Total Protein 03/20/2018 7.3  6.5 - 8.1 g/dL Final  . Albumin 03/20/2018 4.2  3.5 - 5.0 g/dL Final  . AST 03/20/2018 20  15 - 41 U/L Final  . ALT 03/20/2018 19  0 - 44 U/L Final  . Alkaline Phosphatase 03/20/2018 55  38 - 126 U/L Final  . Total Bilirubin 03/20/2018 0.7  0.3 - 1.2 mg/dL Final  . GFR calc non Af Amer 03/20/2018 >60  >60 mL/min Final  . GFR calc Af Amer 03/20/2018 >60  >60 mL/min Final   Comment: (NOTE) The eGFR has  been calculated using the CKD EPI equation. This calculation has not been validated in all clinical situations. eGFR's persistently <60 mL/min signify possible Chronic Kidney Disease.   Benjamin Torres gap 03/20/2018 6  5 - 15 Final   Performed at [facility], [address] [place], [place] [address]  . WBC 03/20/2018 6.1  4.0 - 10.5 K/uL Final  . RBC 03/20/2018 5.09  4.22 - 5.81 MIL/uL Final  . Hemoglobin 03/20/2018 14.6  13.0 - 17.0 g/dL Final  . HCT 03/20/2018 44.8  39.0 - 52.0 % Final  . MCV 03/20/2018 88.0  80.0 - 100.0 fL Final  . MCH 03/20/2018 28.7  26.0 - 34.0 pg Final  . MCHC 03/20/2018 32.6  30.0 - 36.0 g/dL Final  . RDW 03/20/2018 12.8  11.5 - 15.5 % Final  . Platelets 03/20/2018 199  150 - 400 K/uL Final  . nRBC 03/20/2018 0.0  0.0 - 0.2 % Final  . Neutrophils Relative % 03/20/2018 65  % Final  . Neutro Abs 03/20/2018 4.0  1.7 - 7.7 K/uL Final  . Lymphocytes Relative 03/20/2018  21  % Final  . Lymphs Abs 03/20/2018 1.3  0.7 - 4.0 K/uL Final  . Monocytes Relative 03/20/2018 10  % Final  . Monocytes Absolute 03/20/2018 0.6  0.1 - 1.0 K/uL Final  . Eosinophils Relative 03/20/2018 3  % Final  . Eosinophils Absolute 03/20/2018 0.2  0.0 - 0.5 K/uL Final  . Basophils Relative 03/20/2018 1  % Final  . Basophils Absolute 03/20/2018 0.0  0.0 - 0.1 K/uL Final  . Immature Granulocytes 03/20/2018 0  % Final  . Abs Immature Granulocytes 03/20/2018 0.01  0.00 - 0.07 K/uL Final   Performed at [facility], [address] [place], [place] [address]    Assessment:  Benjamin Torres is a 62 y.o. male with stage III supraglottic larynx cancer s/p concurrent cetuximab and radiation.  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 PET scan on 08/18/2013 revealed hypermetabolic soft tissue lesion in the right hypopharynx consistent with epiglottic lesion. There also was a high right paratracheal lymph node which was also hypermetabolic and suspicious for metastatic disease.  Clinical stage was T2N1M0.  He was treated with radiation therapy and cetuximab beginning 09/07/2013.  He received 7,000 cGy from 09/07/2013 - 11/10/2013.    PET scan on 06/21/2015 revealed no evidence of recurrent disease.  PET scan on 02/12/2017 revealed no evidence of hypermetabolic locoregional or distant metastatic disease.  ENT evaluation in 11/2016 was negative per patient.  He has hypothyroidism.  He is on Synthroid.  He has a posterior bladder diverticulum and prostatomegaly, which causes him to experience LUTS. PSA was 1.91 on 01/13/2017.  EGD on 03/07/2017 revealed a medium sized hiatal hernia, moderate inflammation in the stomach, and mild inflammation in the duodenal bulb. Findings c/w gastritis and duodenitis. Pathology demonstrated chronic inactive gastritis. Negative for H.pylori.   Colonoscopy on 03/07/2017 revealed two 4-5 mm sessile polyps in the descending colon,  one 2 mm polyp in the sigmoid colon, diverticulosis, and non-bleeding internal hemorrhoids. Pathology showed that the 2 polyps from the descending colon were tubular adenomas, and the lesion from the sigmoid was a hyperplastic polyp.   Symptomatically,  He notes interval respiratory issues.  Exam is stable.   Plan: 1.  Labs today:  CBC with diff, CMP, TSH. 2.  Stage III supraglottic larynx cancer:  He is  > 5 years s/p completion of concurrent chemotherapy and radiation.  Discuss guidelines:  imaging based on worrisome signs/symptoms, smoking history, and areas inaccessible to direct visualization.   As patient 5 years post treatment, discuss annual surveillance. 3.  Smoking history:  Discuss referral to low dose chest CT program. 4.  Anticipate follow-up yearly after next visit. 5.  RTC in 6 months for MD assessment, labs (CBC with diff, CMP, TSH), and review of low dose chest CT.   Honor Loh, NP  03/20/2018, 10:38 AM   I saw and evaluated the patient, participating in the key portions of the service and reviewing pertinent diagnostic studies and records.  I reviewed the nurse practitioner's note and agree with the findings and the plan.  The assessment and plan were discussed with the patient.  A few questions were asked by the patient and answered.   Nolon Stalls, MD 03/20/2018,10:38 AM

## 2018-03-24 ENCOUNTER — Telehealth: Payer: Self-pay | Admitting: *Deleted

## 2018-03-24 NOTE — Telephone Encounter (Signed)
Received referral for low dose lung cancer screening CT scan. Message left at phone number listed in EMR for patient to call me back to facilitate scheduling scan.  

## 2018-03-25 ENCOUNTER — Encounter: Payer: Self-pay | Admitting: *Deleted

## 2018-06-05 ENCOUNTER — Telehealth: Payer: Self-pay | Admitting: Urology

## 2018-06-05 MED ORDER — TAMSULOSIN HCL 0.4 MG PO CAPS: 0.4000 mg | ORAL_CAPSULE | Freq: Every day | ORAL | 0 refills | Status: DC

## 2018-06-05 NOTE — Telephone Encounter (Signed)
My office note of November 2018 recommended annual follow-up.  He needs annual follow-up for prescription refills.  I sent in a 30-day Rx and would have him see Larene Beach for medication refill.  He can follow-up prn if his primary provider will refill.

## 2018-06-05 NOTE — Telephone Encounter (Signed)
LMOM for patient to return call.

## 2018-06-05 NOTE — Telephone Encounter (Signed)
Pt needs refill of Tamsulosin sent to [facility].  Pharmacy has tried to contact office.

## 2018-06-05 NOTE — Telephone Encounter (Signed)
Patient has not been seen since 2018. He states you told him he did not need to be seen for 5 years. Please advise on refill

## 2018-06-05 NOTE — Telephone Encounter (Signed)
Patient refused to schedule a follow up appointment, I let him know that we would no longer be able to give him any more refills without him being seen. He said ok.   Sharyn Lull

## 2018-06-14 ENCOUNTER — Other Ambulatory Visit: Payer: Self-pay | Admitting: Urgent Care

## 2018-06-14 DIAGNOSIS — C159 Malignant neoplasm of esophagus, unspecified: Secondary | ICD-10-CM

## 2018-06-15 NOTE — Telephone Encounter (Signed)
It does not appear as if he is taking this correctly. I refilled a 3 month supply 6 months ago. TSH low and free T4 normal.

## 2018-07-16 ENCOUNTER — Telehealth: Payer: Self-pay | Admitting: Urology

## 2018-07-16 NOTE — Telephone Encounter (Signed)
Patient has been rescheduled from 07-16-18 to may 26-20 and will need a refill on his Flomax. Can this be sent to his pharmacy?   Thanks, Sharyn Lull

## 2018-07-17 ENCOUNTER — Ambulatory Visit: Payer: 59 | Admitting: Urology

## 2018-07-17 MED ORDER — TAMSULOSIN HCL 0.4 MG PO CAPS: 0.4000 mg | ORAL_CAPSULE | Freq: Every day | ORAL | 2 refills | Status: DC

## 2018-07-17 NOTE — Telephone Encounter (Signed)
Rx was sent  

## 2018-07-23 ENCOUNTER — Other Ambulatory Visit: Payer: Self-pay

## 2018-07-23 DIAGNOSIS — C159 Malignant neoplasm of esophagus, unspecified: Secondary | ICD-10-CM

## 2018-07-24 ENCOUNTER — Telehealth: Payer: Self-pay

## 2018-07-24 ENCOUNTER — Other Ambulatory Visit: Payer: Self-pay | Admitting: Hematology and Oncology

## 2018-07-24 DIAGNOSIS — C159 Malignant neoplasm of esophagus, unspecified: Secondary | ICD-10-CM

## 2018-07-24 MED ORDER — LEVOTHYROXINE SODIUM 112 MCG PO TABS: 112.0000 ug | ORAL_TABLET | Freq: Every day | ORAL | 2 refills | Status: DC

## 2018-07-24 NOTE — Telephone Encounter (Signed)
-----   Message from Secundino Ginger sent at 07/24/2018 10:04 AM EDT ----- Regarding: FW: MEDICATION Contact: [phone]  ----- Message ----- From: Secundino Ginger Sent: 07/23/2018   3:13 PM EDT To: Arlan Organ, RN, # Subject: MEDICATION                                     PT LEFT A MSG THAT HE ONLY HAD 2 LEVATHYROXINE PILLS LEFT AND PHARMACY SAID IT NEEDED TO BE PRE AUTHORIZED.

## 2018-07-24 NOTE — Telephone Encounter (Signed)
Spoke th patient to inform him that his Levothyroxine 112 mcg daily has been refill for a 90 day supply, and he can pick it up today form his pharmacy. Benjamin Torres was agreeable and understanding.

## 2018-08-17 DIAGNOSIS — D369 Benign neoplasm, unspecified site: Secondary | ICD-10-CM | POA: Insufficient documentation

## 2018-09-18 ENCOUNTER — Other Ambulatory Visit: Payer: 59

## 2018-09-18 ENCOUNTER — Ambulatory Visit: Payer: 59 | Admitting: Hematology and Oncology

## 2018-09-22 ENCOUNTER — Other Ambulatory Visit: Payer: Self-pay

## 2018-09-22 ENCOUNTER — Encounter: Payer: Self-pay | Admitting: Urology

## 2018-09-22 ENCOUNTER — Ambulatory Visit: Payer: Self-pay | Admitting: Urology

## 2018-09-22 VITALS — BP 138/86 | HR 89 | Ht 67.0 in | Wt 164.0 lb

## 2018-09-22 DIAGNOSIS — N401 Enlarged prostate with lower urinary tract symptoms: Secondary | ICD-10-CM

## 2018-09-22 DIAGNOSIS — R350 Frequency of micturition: Secondary | ICD-10-CM

## 2018-09-22 DIAGNOSIS — R3912 Poor urinary stream: Secondary | ICD-10-CM

## 2018-09-22 LAB — BLADDER SCAN AMB NON-IMAGING

## 2018-09-22 MED ORDER — TAMSULOSIN HCL 0.4 MG PO CAPS: 0.4000 mg | ORAL_CAPSULE | Freq: Every day | ORAL | 3 refills | Status: AC

## 2018-09-22 NOTE — Progress Notes (Signed)
   09/22/2018 3:44 PM   Gray Bernhardt 1956-03-26 [id number]  Reason for visit: Follow up BPH, bladder diverticulum  HPI: I saw Mr. Pryce in urology clinic in follow-up today for BPH.  He is regularly followed by Dr. Bernardo Heater, but he was out of the office today and I was asked to fill in.  He is a 63 year old male that was originally diagnosed with larynx cancer and found to have a widemouth posterior bladder diverticulum and referred to urology in November 2018.  He was started on Flomax at that time for mild to moderate urinary symptoms.  He denies any history of gross hematuria, bladder stones, or urinary tract infections.  He is minimally bothered by his urinary symptoms, and he feels they are very well managed on Flomax.  PVR in clinic today is well within the normal range at 32 cc.  Renal function is normal with creatinine 0.78, EGFR greater than 60.   ROS: Please see flowsheet from today's date for complete review of systems.  Physical Exam: BP 138/86   Pulse 89   Ht '5\' 7"'$  (1.702 m)   Wt 164 lb (74.4 kg)   BMI 25.69 kg/m    Constitutional:  Alert and oriented, No acute distress. Respiratory: Normal respiratory effort, no increased work of breathing. GI: Abdomen is soft, nontender, nondistended, no abdominal masses Skin: No rashes, bruises or suspicious lesions. Neurologic: Grossly intact, no focal deficits, moving all 4 extremities. Psychiatric: Normal mood and affect  Assessment & Plan:   In summary, the patient is a 63 year old male with a known widemouth bladder diverticulum and mild urinary symptoms well managed on Flomax.  His PVRs have been below 50 cc over the last 2 years, and he denies any urinary complaints.  We discussed the pathophysiology of possible bladder outlet obstruction and the relationship to diverticulum.  We discussed the risk of infections, retention, and bladder stones.  He would like to follow-up his PCP for Flomax refills, which is reasonable.  If he  has worsening urinary symptoms, urinary retention, recurrent urinary tract infections, or bladder stones I encouraged him to reestablish care with [place].  Flomax refilled Refill Flomax yearly with PCP Follow-up as needed  A total of 10 minutes were spent face-to-face with the patient, greater than 50% was spent in patient education, counseling, and coordination of care regarding BPH, bladder diverticulum, and warning signs.   [place], [facility] [address] [place], [place] [address] [phone]

## 2018-09-29 ENCOUNTER — Other Ambulatory Visit: Payer: Self-pay | Admitting: Hematology and Oncology

## 2018-09-29 DIAGNOSIS — C159 Malignant neoplasm of esophagus, unspecified: Secondary | ICD-10-CM

## 2018-09-29 DIAGNOSIS — E038 Other specified hypothyroidism: Secondary | ICD-10-CM

## 2018-09-29 NOTE — Telephone Encounter (Signed)
Dx:  Low TSH level   Ref Range & Units 78mo ago (03/20/18) 74yr ago (09/02/17) 51yr ago (01/13/17) 20yr ago (06/20/16) 25yr ago (03/14/16)  Free T4 0.82 - 1.77 ng/dL 0.94  1.07 CM 0.87 R, CM 1.00 R, CM 1.06 R, CM  Comment: (NOTE)  Biotin ingestion may interfere with free T4 tests. If the results are  inconsistent with the TSH level, previous test results, or the  clinical presentation, then consider biotin interference. If needed,  order repeat testing after stopping biotin.  Performed at [facility], [address] [place],  [place] [address]   Resulting Agency  [facility] [place] [facility] [place] [facility] [place] [facility] [place] [facility] [place]

## 2018-10-16 ENCOUNTER — Other Ambulatory Visit: Payer: Self-pay

## 2018-10-16 ENCOUNTER — Inpatient Hospital Stay: Payer: Self-pay | Attending: Hematology and Oncology

## 2018-10-16 DIAGNOSIS — E038 Other specified hypothyroidism: Secondary | ICD-10-CM

## 2018-10-16 DIAGNOSIS — Z8521 Personal history of malignant neoplasm of larynx: Secondary | ICD-10-CM | POA: Insufficient documentation

## 2018-10-16 DIAGNOSIS — C329 Malignant neoplasm of larynx, unspecified: Secondary | ICD-10-CM

## 2018-10-16 LAB — COMPREHENSIVE METABOLIC PANEL
ALT: 14 U/L (ref 0–44)
AST: 15 U/L (ref 15–41)
Albumin: 4.1 g/dL (ref 3.5–5.0)
Alkaline Phosphatase: 48 U/L (ref 38–126)
Anion gap: 9 (ref 5–15)
BUN: 16 mg/dL (ref 8–23)
CO2: 24 mmol/L (ref 22–32)
Calcium: 8.7 mg/dL — ABNORMAL LOW (ref 8.9–10.3)
Chloride: 104 mmol/L (ref 98–111)
Creatinine, Ser: 0.76 mg/dL (ref 0.61–1.24)
GFR calc Af Amer: >60 mL/min (ref >60)
GFR calc non Af Amer: >60 mL/min (ref >60)
Glucose, Bld: 110 mg/dL — ABNORMAL HIGH (ref 70–99)
Potassium: 4.1 mmol/L (ref 3.5–5.1)
Sodium: 137 mmol/L (ref 135–145)
Total Bilirubin: 0.5 mg/dL (ref 0.3–1.2)
Total Protein: 7.2 g/dL (ref 6.5–8.1)

## 2018-10-16 LAB — CBC WITH DIFFERENTIAL/PLATELET
Abs Immature Granulocytes: 0.02 10*3/uL (ref 0.00–0.07)
Basophils Absolute: 0.1 10*3/uL (ref 0.0–0.1)
Basophils Relative: 1 %
Eosinophils Absolute: 0.1 10*3/uL (ref 0.0–0.5)
Eosinophils Relative: 2 %
HCT: 44.9 % (ref 39.0–52.0)
Hemoglobin: 15 g/dL (ref 13.0–17.0)
Immature Granulocytes: 0 %
Lymphocytes Relative: 16 %
Lymphs Abs: 1.1 10*3/uL (ref 0.7–4.0)
MCH: 28.9 pg (ref 26.0–34.0)
MCHC: 33.4 g/dL (ref 30.0–36.0)
MCV: 86.5 fL (ref 80.0–100.0)
Monocytes Absolute: 0.7 10*3/uL (ref 0.1–1.0)
Monocytes Relative: 10 %
Neutro Abs: 4.8 10*3/uL (ref 1.7–7.7)
Neutrophils Relative %: 71 %
Platelets: 216 10*3/uL (ref 150–400)
RBC: 5.19 MIL/uL (ref 4.22–5.81)
RDW: 12.6 % (ref 11.5–15.5)
WBC: 6.7 10*3/uL (ref 4.0–10.5)
nRBC: 0 % (ref 0.0–0.2)

## 2018-10-16 LAB — T4, FREE: Free T4: 1.12 ng/dL (ref 0.61–1.12)

## 2018-10-16 LAB — TSH: TSH: 2.383 u[IU]/mL (ref 0.350–4.500)

## 2018-10-19 ENCOUNTER — Telehealth: Payer: Self-pay | Admitting: *Deleted

## 2018-10-19 ENCOUNTER — Encounter: Payer: Self-pay | Admitting: Hematology and Oncology

## 2018-10-19 ENCOUNTER — Telehealth: Payer: Self-pay

## 2018-10-19 ENCOUNTER — Inpatient Hospital Stay (HOSPITAL_BASED_OUTPATIENT_CLINIC_OR_DEPARTMENT_OTHER): Payer: Self-pay | Admitting: Hematology and Oncology

## 2018-10-19 ENCOUNTER — Other Ambulatory Visit: Payer: 59

## 2018-10-19 DIAGNOSIS — Z122 Encounter for screening for malignant neoplasm of respiratory organs: Secondary | ICD-10-CM

## 2018-10-19 DIAGNOSIS — Z9221 Personal history of antineoplastic chemotherapy: Secondary | ICD-10-CM

## 2018-10-19 DIAGNOSIS — Z923 Personal history of irradiation: Secondary | ICD-10-CM

## 2018-10-19 DIAGNOSIS — C329 Malignant neoplasm of larynx, unspecified: Secondary | ICD-10-CM

## 2018-10-19 DIAGNOSIS — Z87891 Personal history of nicotine dependence: Secondary | ICD-10-CM

## 2018-10-19 NOTE — Progress Notes (Signed)
[facility]  [address] [place], [place] [address] Phone: [phone]  Fax: [phone]   Telemedicine Office Visit:  10/19/2018  Referring physician: Cletis Athens, MD  I connected with Gray Bernhardt on 10/19/2018 at 2:19 PM by videoconferencing and verified that I was speaking with the correct person using 2 identifiers.  The patient was at home.  I discussed the limitations, risk, security and privacy concerns of performing an evaluation and management service by videoconferencing and the availability of in person appointments.  I also discussed with the patient that there may be a patient responsible charge related to this service.  The patient expressed understanding and agreed to proceed.   Chief Complaint: Benjamin Torres is a 63 y.o. 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   HPI: The patient was last seen in the oncology clinic on 03/20/2018. At that time, he noted interval respiratory issues.  Exam was stable. He continued on surveillance.   He established care with PCP Emily Filbert, MD at the [facility] on 08/17/2018.  He complained of worsening chronic shortness of breath to minimal exertion and wheezing with new right chest pain. CXR showed negative results. He was started on prednisone 10mg  daily x14 days and Symbicort. He was started on Zoloft for anxiety.   He was seen in Urology on 09/22/2018 by Dr. Nickolas Madrid for BPH and bladder diverticulum. He continued on Flomax and will continue follow-up with his PCP.    Labs on 10/16/2018 reviewed: WBC 6,700, hemoglobin 15/0, hematocrit 44.9, platelets 216,000. Calcium 8.7 (corrected 8.62). TSH 2.383. Free T4 1.12.  During the interim, the patient says he feels "fine." His weight is up 10 lbs, which could attribute to eating desserts like ice cream.   He denies any fever, cough, abdominal symptoms, bone and joint pain. He reports the  Flomax is helping. His dry mouth has improved. He is interested in cotinuing 1 year follow-ups and screenings.    Past Medical History:  Diagnosis Date  . Cancer of larynx ([place]) 11/06/2014  . COPD (chronic obstructive pulmonary disease) ([place])   . Hypothyroidism   . PE (pulmonary thromboembolism) ([place])    right lung, over 10 yrs ago    Past Surgical History:  Procedure Laterality Date  . ANAL FISSURE REPAIR    . APPENDECTOMY    . BIOPSY PHARYNX  2015   Larynx BX  . COLONOSCOPY WITH PROPOFOL N/A 03/07/2017   Procedure: COLONOSCOPY WITH PROPOFOL;  Surgeon: Lucilla Lame, MD;  Location: [place];  Service: Endoscopy;  Laterality: N/A;  . ESOPHAGOGASTRODUODENOSCOPY (EGD) WITH PROPOFOL N/A 03/07/2017   Procedure: ESOPHAGOGASTRODUODENOSCOPY (EGD) WITH PROPOFOL;  Surgeon: Lucilla Lame, MD;  Location: [place];  Service: Endoscopy;  Laterality: N/A;  . POLYPECTOMY  03/07/2017   Procedure: POLYPECTOMY;  Surgeon: Lucilla Lame, MD;  Location: [place];  Service: Endoscopy;;    History reviewed. No pertinent family history.  Social History:  reports that he quit smoking about 5 years ago. He has a 60.00 pack-year smoking history. He has never used smokeless tobacco. He reports that he does not drink alcohol. No history on file for drug. He likes to ride motorcycles.  He likes to play soft ball.  He works a Armed forces logistics/support/administrative officer. The patient is accompanied by his wife today.  Participants in the patient's visit and their role in the encounter included the patient, his wife, and Vito Berger, CMA,  today.  The intake visit was provided by Vito Berger, CMA.  Allergies: No Known Allergies  Current Medications: Current Outpatient Medications  Medication Sig Dispense Refill  . levothyroxine (SYNTHROID) 112 MCG tablet TAKE 1 TABLET (112 MCG TOTAL) BY MOUTH DAILY BEFORE BREAKFAST. 30 tablet 2  . tamsulosin (FLOMAX) 0.4 MG CAPS capsule Take 1 capsule (0.4 mg total) by mouth  daily. 90 capsule 3  . albuterol (PROVENTIL HFA;VENTOLIN HFA) 108 (90 Base) MCG/ACT inhaler Inhale 2 puffs every 6 (six) hours as needed into the lungs for wheezing or shortness of breath.     No current facility-administered medications for this visit.     Review of Systems  Constitutional: Negative.  Negative for chills, diaphoresis, fever, malaise/fatigue and weight loss (up 10 pound).       Feels "fine".  HENT: Negative.  Negative for congestion, ear pain, nosebleeds, sinus pain and sore throat.        Xerostomia; chronic; not using SalivaMax.  Eyes: Negative.  Negative for double vision, photophobia and pain.  Respiratory: Negative for cough, hemoptysis, sputum production, shortness of breath and wheezing.   Cardiovascular: Negative.  Negative for chest pain, palpitations, orthopnea, leg swelling and PND.  Gastrointestinal: Negative.  Negative for abdominal pain, blood in stool, constipation, diarrhea, melena and vomiting.  Genitourinary: Negative.  Negative for dysuria, frequency, hematuria and urgency.       LUTS; difficulty initiating stream, voids small amounts, nocturia. Known prostatomegaly and posterior bladder diverticulum; sees urology  Musculoskeletal: Negative.  Negative for back pain, falls, joint pain, myalgias and neck pain.  Skin: Negative.  Negative for itching and rash.  Neurological: Negative.  Negative for dizziness, tremors, speech change, focal weakness, weakness and headaches.  Endo/Heme/Allergies: Negative.  Does not bruise/bleed easily.       HYPOthyroidism on levothyroxine.  Psychiatric/Behavioral: Negative for depression, memory loss and suicidal ideas. The patient is not nervous/anxious and does not have insomnia.   All other systems reviewed and are negative.   Performance status (ECOG): 1  Physical Exam  Constitutional: He is oriented to person, place, and time. He appears well-developed and well-nourished. No distress.  HENT:  Head: Normocephalic and  atraumatic.  Thin gray hair and goatee.  Male pattern baldness.  Eyes: Conjunctivae and EOM are normal. No scleral icterus.  Glasses.  Brown eyes.  Neurological: He is alert and oriented to person, place, and time.  Psychiatric: He has a normal mood and affect. His behavior is normal. Judgment and thought content normal.  Nursing note reviewed.    No visits with results within 3 Day(s) from this visit.  Latest known visit with results is:  Appointment on 10/16/2018  Component Date Value Ref Range Status  . Free T4 10/16/2018 1.12  0.61 - 1.12 ng/dL Final   Comment: (NOTE) Biotin ingestion may interfere with free T4 tests. If the results are inconsistent with the TSH level, previous test results, or the clinical presentation, then consider biotin interference. If needed, order repeat testing after stopping biotin. Performed at [facility], [address] [place], [place] [address]   . TSH 10/16/2018 2.383  0.350 - 4.500 uIU/mL Final   Comment: Performed by a 3rd Generation assay with a functional sensitivity of <=0.01 uIU/mL. Performed at [facility], [address], [place], [place] [address]   . Sodium 10/16/2018 137  135 - 145 mmol/L Final  . Potassium 10/16/2018 4.1  3.5 - 5.1 mmol/L Final  . Chloride 10/16/2018 104  98 - 111  mmol/L Final  . CO2 10/16/2018 24  22 - 32 mmol/L Final  . Glucose, Bld 10/16/2018 110* 70 - 99 mg/dL Final  . BUN 10/16/2018 16  8 - 23 mg/dL Final  . Creatinine, Ser 10/16/2018 0.76  0.61 - 1.24 mg/dL Final  . Calcium 10/16/2018 8.7* 8.9 - 10.3 mg/dL Final  . Total Protein 10/16/2018 7.2  6.5 - 8.1 g/dL Final  . Albumin 10/16/2018 4.1  3.5 - 5.0 g/dL Final  . AST 10/16/2018 15  15 - 41 U/L Final  . ALT 10/16/2018 14  0 - 44 U/L Final  . Alkaline Phosphatase 10/16/2018 48  38 - 126 U/L Final  . Total Bilirubin 10/16/2018 0.5  0.3 - 1.2 mg/dL Final  . GFR calc non Af Amer 10/16/2018 >60  >60 mL/min Final  . GFR calc Af Amer  10/16/2018 >60  >60 mL/min Final  . Anion gap 10/16/2018 9  5 - 15 Final   Performed at [facility], [address] [place], [place] [address]  . WBC 10/16/2018 6.7  4.0 - 10.5 K/uL Final  . RBC 10/16/2018 5.19  4.22 - 5.81 MIL/uL Final  . Hemoglobin 10/16/2018 15.0  13.0 - 17.0 g/dL Final  . HCT 10/16/2018 44.9  39.0 - 52.0 % Final  . MCV 10/16/2018 86.5  80.0 - 100.0 fL Final  . MCH 10/16/2018 28.9  26.0 - 34.0 pg Final  . MCHC 10/16/2018 33.4  30.0 - 36.0 g/dL Final  . RDW 10/16/2018 12.6  11.5 - 15.5 % Final  . Platelets 10/16/2018 216  150 - 400 K/uL Final  . nRBC 10/16/2018 0.0  0.0 - 0.2 % Final  . Neutrophils Relative % 10/16/2018 71  % Final  . Neutro Abs 10/16/2018 4.8  1.7 - 7.7 K/uL Final  . Lymphocytes Relative 10/16/2018 16  % Final  . Lymphs Abs 10/16/2018 1.1  0.7 - 4.0 K/uL Final  . Monocytes Relative 10/16/2018 10  % Final  . Monocytes Absolute 10/16/2018 0.7  0.1 - 1.0 K/uL Final  . Eosinophils Relative 10/16/2018 2  % Final  . Eosinophils Absolute 10/16/2018 0.1  0.0 - 0.5 K/uL Final  . Basophils Relative 10/16/2018 1  % Final  . Basophils Absolute 10/16/2018 0.1  0.0 - 0.1 K/uL Final  . Immature Granulocytes 10/16/2018 0  % Final  . Abs Immature Granulocytes 10/16/2018 0.02  0.00 - 0.07 K/uL Final   Performed at [facility], [address] [place], [place] [address]    Assessment:  Benjamin Torres is a 63 y.o. male with stage III supraglottic larynx cancer s/p concurrent cetuximab and radiation.  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 PET scan on 08/18/2013 revealed hypermetabolic soft tissue lesion in the right hypopharynx consistent with epiglottic lesion. There also was a high right paratracheal lymph node which was also hypermetabolic and suspicious for metastatic disease.  Clinical stage was T2N1M0.  He was treated with radiation therapy and cetuximab beginning 09/07/2013.  He received 7,000 cGy from  09/07/2013 - 11/10/2013.    PET scan on 06/21/2015 revealed no evidence of recurrent disease.  PET scan on 02/12/2017 revealed no evidence of hypermetabolic locoregional or distant metastatic disease.  ENT evaluation in 11/2016 was negative per patient.  He has hypothyroidism.  He is on Synthroid.  He has a posterior bladder diverticulum and prostatomegaly, which causes him to experience LUTS. PSA was 1.91 on 01/13/2017.  EGD on 03/07/2017 revealed a medium sized hiatal hernia,  moderate inflammation in the stomach, and mild inflammation in the duodenal bulb. Findings c/w gastritis and duodenitis. Pathology demonstrated chronic inactive gastritis. Negative for H.pylori.   Colonoscopy on 03/07/2017 revealed two 4-5 mm sessile polyps in the descending colon, one 2 mm polyp in the sigmoid colon, diverticulosis, and non-bleeding internal hemorrhoids. Pathology showed that the 2 polyps from the descending colon were tubular adenomas, and the lesion from the sigmoid was a hyperplastic polyp.   Symptomatically, he is doing well.  He voices no concerns.  Plan: 1.   Labs today:  CBC with diff, CMP, TSH. 2.   Stage III supraglottic larynx cancer             Clinically he is doing well.    He is more than 5 years status post completion of concurrent chemotherapy and radiation.    Discuss annual surveillance.    Review imaging based on any worrisome signs/symptoms and areas inaccessible to direct visualization.             Patient wishes to follow-up in the oncology clinic. 3.   Smoking history             Patient interested in the low-dose chest CT screening program.    RN to contact Melinda Crutch regarding program.   4.   RTC in 1 year for MD assessment and labs (CBC with diff, CMP).  I discussed the assessment and treatment plan with the patient.  The patient was provided an opportunity to ask questions and all were answered.  The patient agreed with the plan and demonstrated an understanding of  the instructions.  The patient was advised to call back or seek an in person evaluation if the symptoms worsen or if the condition fails to improve as anticipated.  I provided 10 minutes (2:19 PM - 2:29 PM) of face-to-face video visit time during this this encounter and > 50% was spent counseling as documented under my assessment and plan.  I provided these services from the [facility] office.   Nolon Stalls, MD, PhD  10/19/2018, 2:19 PM  I, Selena Batten, am acting as scribe for [employer]. Mike Gip, MD, PhD.  I,  C. Mike Gip, MD, have reviewed the above documentation for accuracy and completeness, and I agree with the above.

## 2018-10-19 NOTE — Telephone Encounter (Signed)
-----   Message from Lequita Asal, MD sent at 10/16/2018  1:25 PM EDT ----- Regarding: Please call patient  Calcium slightly low at 8.7.  Normal is 8.9.  Ensure taking calcium supplement.  M ----- Message ----- From: Buel Ream, Lab In [place] Sent: 10/16/2018  11:31 AM EDT To: Lequita Asal, MD

## 2018-10-19 NOTE — Telephone Encounter (Signed)
Spoke with Benjamin Torres to inform the ptient that his calcium has dropped slightly low to 8.7 I have informed the patient that Benjamin Torres would like for him to start oral calcium 600 mg BID.The patient he is currently not taking any calcium. The patient was understanding and agreeable to start oral calcium.

## 2018-10-19 NOTE — Progress Notes (Signed)
No new changes noted today. The patient name and DOB has been verified by phone today. 

## 2018-10-19 NOTE — Telephone Encounter (Signed)
Received referral for initial lung cancer screening scan. Contacted patient and obtained smoking history,(former,quit 04/29/13, 60 pack year) as well as answering questions related to screening process. Patient denies signs of lung cancer such as weight loss or hemoptysis. Patient denies comorbidity that would prevent curative treatment if lung cancer were found. Patient is scheduled for shared decision making visit and CT scan on 10/29/18 at 2pm.

## 2018-10-21 ENCOUNTER — Other Ambulatory Visit: Payer: Self-pay | Admitting: Hematology and Oncology

## 2018-10-21 DIAGNOSIS — C159 Malignant neoplasm of esophagus, unspecified: Secondary | ICD-10-CM

## 2018-10-21 NOTE — Telephone Encounter (Signed)
TSH Order: [id number] Status:  Final result  Visible to patient:  No (not released)  Next appt:  10/29/2018 at 02:00 PM in Oncology Verlon Au, NP)  Dx:  Cancer of larynx ([place])  Ref Range & Units 5d ago  TSH 0.350 - 4.500 uIU/mL 2.383   Comment: Performed by a 3rd Generation assay with a functional sensitivity of <=0.01 uIU/mL.  Performed at [facility], [address], [place], [place] [address]   Resulting Agency  [facility]      Specimen Collected: 10/16/18 11:23  Last Resulted: 10/16/18 12:35     Lab Flowsheet   Order Details   View Encounter   Lab and Collection Details   Routing   Result History         Other Results from 10/16/2018  Follow-up Communication for T4, free   10/19/2018 Telephone     T4, free Order: [id number]  Status:  Final result  Visible to patient:  No (not released)  Next appt:  10/29/2018 at 02:00 PM in Oncology Verlon Au, NP)  Dx:  Other specified hypothyroidism  Ref Range & Units 5d ago  Free T4 0.61 - 1.12 ng/dL 1.12   Comment: (NOTE)  Biotin ingestion may interfere with free T4 tests. If the results are  inconsistent with the TSH level, previous test results, or the  clinical presentation, then consider biotin interference. If needed,  order repeat testing after stopping biotin.  Performed at [facility], [address], [place],   [address]   Resulting Agency  [facility]      Specimen Collected: 10/16/18 11:23  Last Resulted: 10/16/18 12:35

## 2018-10-23 ENCOUNTER — Ambulatory Visit: Payer: 59 | Admitting: Hematology and Oncology

## 2018-10-23 ENCOUNTER — Other Ambulatory Visit: Payer: 59

## 2018-10-28 ENCOUNTER — Telehealth: Payer: Self-pay | Admitting: *Deleted

## 2018-10-28 NOTE — Telephone Encounter (Signed)
Patient reports he will have to reschedule lung screening due to insurance issues. Patient is made aware that we can assist with charitable funds but he still wishes to postpone lung screening and will call back when he is ready to schedule.

## 2018-10-29 ENCOUNTER — Ambulatory Visit: Admission: RE | Admit: 2018-10-29 | Payer: Self-pay | Source: Ambulatory Visit

## 2018-10-29 ENCOUNTER — Inpatient Hospital Stay: Payer: Self-pay | Admitting: Nurse Practitioner

## 2018-11-16 ENCOUNTER — Telehealth: Payer: Self-pay | Admitting: *Deleted

## 2018-11-16 NOTE — Telephone Encounter (Signed)
Received referral for low dose lung cancer screening CT scan. Message left at phone number listed in EMR for patient to call me back to facilitate scheduling scan.  

## 2018-12-03 ENCOUNTER — Telehealth: Payer: Self-pay | Admitting: *Deleted

## 2018-12-03 NOTE — Telephone Encounter (Signed)
Received referral for low dose lung cancer screening CT scan. Message left at phone number listed in EMR for patient to call me back to facilitate scheduling scan.  

## 2018-12-30 ENCOUNTER — Telehealth: Payer: Self-pay | Admitting: *Deleted

## 2018-12-30 NOTE — Telephone Encounter (Signed)
Contacted regarding lung screening referral. Patient requests to consider in 2 weeks.

## 2019-03-15 ENCOUNTER — Encounter: Payer: Self-pay | Admitting: *Deleted

## 2019-03-15 ENCOUNTER — Telehealth: Payer: Self-pay | Admitting: *Deleted

## 2019-03-15 NOTE — Telephone Encounter (Signed)
Left message for patient to notify them that it is time to schedule annual low dose lung cancer screening CT scan. Instructed patient to call back to verify information prior to the scan being scheduled.  

## 2019-03-22 ENCOUNTER — Encounter: Payer: Self-pay | Admitting: *Deleted

## 2019-04-10 ENCOUNTER — Other Ambulatory Visit: Payer: Self-pay | Admitting: Hematology and Oncology

## 2019-04-10 DIAGNOSIS — C159 Malignant neoplasm of esophagus, unspecified: Secondary | ICD-10-CM

## 2019-04-12 ENCOUNTER — Other Ambulatory Visit: Payer: Self-pay

## 2019-04-12 DIAGNOSIS — D124 Benign neoplasm of descending colon: Secondary | ICD-10-CM

## 2019-04-12 DIAGNOSIS — C159 Malignant neoplasm of esophagus, unspecified: Secondary | ICD-10-CM

## 2019-04-13 ENCOUNTER — Inpatient Hospital Stay: Payer: Self-pay | Attending: Hematology and Oncology

## 2019-07-07 ENCOUNTER — Other Ambulatory Visit: Payer: Self-pay | Admitting: Hematology and Oncology

## 2019-07-07 DIAGNOSIS — C159 Malignant neoplasm of esophagus, unspecified: Secondary | ICD-10-CM

## 2019-07-07 DIAGNOSIS — E038 Other specified hypothyroidism: Secondary | ICD-10-CM

## 2019-08-06 ENCOUNTER — Ambulatory Visit: Payer: Medicare HMO | Attending: Internal Medicine

## 2019-08-06 DIAGNOSIS — Z23 Encounter for immunization: Secondary | ICD-10-CM

## 2019-08-06 NOTE — Progress Notes (Signed)
   Covid-19 Vaccination Clinic  Name:  Benjamin Torres    MRN: [id number] DOB: [birth year]  08/06/2019  Benjamin Torres was observed post Covid-19 immunization for 15 minutes without incident. He was provided with Vaccine Information Sheet and instruction to access the V-Safe system.   Benjamin Torres was instructed to call 911 with any severe reactions post vaccine: Marland Kitchen Difficulty breathing  . Swelling of face and throat  . A fast heartbeat  . A bad rash all over body  . Dizziness and weakness   Immunizations Administered    Name Date Dose VIS Date Route   Pfizer COVID-19 Vaccine 08/06/2019 10:02 AM 0.3 mL 04/09/2019 Intramuscular   Manufacturer: [place]   Lot: [id number]   [place]: [id number]

## 2019-08-31 ENCOUNTER — Ambulatory Visit: Payer: Medicare HMO | Attending: Internal Medicine

## 2019-08-31 DIAGNOSIS — Z23 Encounter for immunization: Secondary | ICD-10-CM

## 2019-08-31 NOTE — Progress Notes (Signed)
   Covid-19 Vaccination Clinic  Name:  Benjamin Torres    MRN: [id number] DOB: 1955/08/03  08/31/2019  [name] was observed post Covid-19 immunization for 15 minutes without incident. He was provided with Vaccine Information Sheet and instruction to access the V-Safe system.   [name] was instructed to call 911 with any severe reactions post vaccine: Marland Kitchen Difficulty breathing  . Swelling of face and throat  . A fast heartbeat  . A bad rash all over body  . Dizziness and weakness   Immunizations Administered    Name Date Dose VIS Date Route   Pfizer COVID-19 Vaccine 08/31/2019 10:42 AM 0.3 mL 06/23/2018 Intramuscular   Manufacturer: [place]   Lot: [id number]   [place]: [id number]

## 2019-09-21 ENCOUNTER — Other Ambulatory Visit: Payer: Self-pay

## 2019-09-21 ENCOUNTER — Other Ambulatory Visit
Admission: RE | Admit: 2019-09-21 | Discharge: 2019-09-21 | Disposition: A | Discharge status: Home / Self Care | Payer: Medicare HMO | Source: Ambulatory Visit | Attending: Cardiology | Admitting: Cardiology

## 2019-09-21 DIAGNOSIS — Z20822 Contact with and (suspected) exposure to covid-19: Secondary | ICD-10-CM | POA: Insufficient documentation

## 2019-09-21 DIAGNOSIS — Z01812 Encounter for preprocedural laboratory examination: Secondary | ICD-10-CM | POA: Diagnosis present

## 2019-09-21 LAB — SARS CORONAVIRUS 2 (TAT 6-24 HRS): SARS Coronavirus 2: NEGATIVE

## 2019-09-23 ENCOUNTER — Other Ambulatory Visit: Payer: Self-pay

## 2019-09-23 ENCOUNTER — Encounter
Admission: RE | Disposition: A | Discharge status: Home / Self Care | Payer: Self-pay | Source: Ambulatory Visit | Attending: Cardiology

## 2019-09-23 ENCOUNTER — Encounter: Payer: Self-pay | Admitting: Cardiology

## 2019-09-23 ENCOUNTER — Other Ambulatory Visit: Payer: Self-pay | Admitting: Cardiology

## 2019-09-23 ENCOUNTER — Ambulatory Visit
Admission: RE | Admit: 2019-09-23 | Discharge: 2019-09-23 | Disposition: A | Discharge status: Home / Self Care | Payer: Medicare HMO | Source: Ambulatory Visit | Attending: Cardiology | Admitting: Cardiology

## 2019-09-23 DIAGNOSIS — Z79899 Other long term (current) drug therapy: Secondary | ICD-10-CM | POA: Insufficient documentation

## 2019-09-23 DIAGNOSIS — I2510 Atherosclerotic heart disease of native coronary artery without angina pectoris: Secondary | ICD-10-CM | POA: Insufficient documentation

## 2019-09-23 DIAGNOSIS — Z79890 Hormone replacement therapy (postmenopausal): Secondary | ICD-10-CM | POA: Diagnosis not present

## 2019-09-23 DIAGNOSIS — F17290 Nicotine dependence, other tobacco product, uncomplicated: Secondary | ICD-10-CM | POA: Insufficient documentation

## 2019-09-23 DIAGNOSIS — Z86711 Personal history of pulmonary embolism: Secondary | ICD-10-CM | POA: Diagnosis not present

## 2019-09-23 DIAGNOSIS — R0600 Dyspnea, unspecified: Secondary | ICD-10-CM | POA: Diagnosis present

## 2019-09-23 DIAGNOSIS — J431 Panlobular emphysema: Secondary | ICD-10-CM | POA: Diagnosis not present

## 2019-09-23 DIAGNOSIS — R0602 Shortness of breath: Secondary | ICD-10-CM | POA: Diagnosis not present

## 2019-09-23 DIAGNOSIS — Z7951 Long term (current) use of inhaled steroids: Secondary | ICD-10-CM | POA: Insufficient documentation

## 2019-09-23 DIAGNOSIS — R9439 Abnormal result of other cardiovascular function study: Secondary | ICD-10-CM | POA: Insufficient documentation

## 2019-09-23 DIAGNOSIS — E039 Hypothyroidism, unspecified: Secondary | ICD-10-CM | POA: Diagnosis not present

## 2019-09-23 DIAGNOSIS — Z8249 Family history of ischemic heart disease and other diseases of the circulatory system: Secondary | ICD-10-CM | POA: Insufficient documentation

## 2019-09-23 DIAGNOSIS — R0609 Other forms of dyspnea: Secondary | ICD-10-CM

## 2019-09-23 HISTORY — PX: LEFT HEART CATH AND CORONARY ANGIOGRAPHY: CATH118249

## 2019-09-23 SURGERY — LEFT HEART CATH AND CORONARY ANGIOGRAPHY
Anesthesia: Moderate Sedation | Laterality: Left

## 2019-09-23 MED ORDER — SODIUM CHLORIDE 0.9% FLUSH: 3.0000 mL | Freq: Two times a day (BID) | INTRAVENOUS | Status: AC

## 2019-09-23 MED ORDER — SODIUM CHLORIDE 0.9 % IV SOLN: 250.0000 mL | INTRAVENOUS | Status: DC | PRN

## 2019-09-23 MED ORDER — HEPARIN (PORCINE) IN NACL 1000-0.9 UT/500ML-% IV SOLN
INTRAVENOUS | Status: DC | PRN
  Administered 2019-09-23: 500 mL

## 2019-09-23 MED ORDER — ONDANSETRON HCL 4 MG/2ML IJ SOLN: 4.0000 mg | Freq: Four times a day (QID) | INTRAMUSCULAR | Status: DC | PRN

## 2019-09-23 MED ORDER — SODIUM CHLORIDE 0.9 % WEIGHT BASED INFUSION
INTRAVENOUS | Status: AC
  Administered 2019-09-23: 1000 mL via INTRAVENOUS

## 2019-09-23 MED ORDER — IOHEXOL 300 MG/ML  SOLN
INTRAMUSCULAR | Status: DC | PRN
  Administered 2019-09-23: 75 mL

## 2019-09-23 MED ORDER — FENTANYL CITRATE (PF) 100 MCG/2ML IJ SOLN
INTRAMUSCULAR | Status: AC
  Filled 2019-09-23: qty 2

## 2019-09-23 MED ORDER — HEPARIN (PORCINE) IN NACL 1000-0.9 UT/500ML-% IV SOLN
INTRAVENOUS | Status: AC
  Filled 2019-09-23: qty 1000

## 2019-09-23 MED ORDER — HYDRALAZINE HCL 20 MG/ML IJ SOLN: 10.0000 mg | INTRAMUSCULAR | Status: DC | PRN

## 2019-09-23 MED ORDER — SODIUM CHLORIDE 0.9 % WEIGHT BASED INFUSION: 1.0000 mL/kg/h | INTRAVENOUS | Status: DC

## 2019-09-23 MED ORDER — SODIUM CHLORIDE 0.9% FLUSH: 3.0000 mL | Freq: Two times a day (BID) | INTRAVENOUS | Status: DC

## 2019-09-23 MED ORDER — MIDAZOLAM HCL 2 MG/2ML IJ SOLN
INTRAMUSCULAR | Status: AC
  Filled 2019-09-23: qty 2

## 2019-09-23 MED ORDER — FENTANYL CITRATE (PF) 100 MCG/2ML IJ SOLN
INTRAMUSCULAR | Status: DC | PRN
  Administered 2019-09-23: 25 ug via INTRAVENOUS

## 2019-09-23 MED ORDER — ASPIRIN 81 MG PO CHEW
CHEWABLE_TABLET | ORAL | Status: AC
  Administered 2019-09-23: 81 mg via ORAL
  Filled 2019-09-23: qty 1

## 2019-09-23 MED ORDER — ASPIRIN 81 MG PO CHEW: 81.0000 mg | CHEWABLE_TABLET | ORAL | Status: AC

## 2019-09-23 MED ORDER — LABETALOL HCL 5 MG/ML IV SOLN: 10.0000 mg | INTRAVENOUS | Status: DC | PRN

## 2019-09-23 MED ORDER — SODIUM CHLORIDE 0.9 % WEIGHT BASED INFUSION: INTRAVENOUS | Status: DC

## 2019-09-23 MED ORDER — SODIUM CHLORIDE 0.9% FLUSH: 3.0000 mL | INTRAVENOUS | Status: DC | PRN

## 2019-09-23 MED ORDER — ACETAMINOPHEN 325 MG PO TABS: 650.0000 mg | ORAL_TABLET | ORAL | Status: DC | PRN

## 2019-09-23 MED ORDER — MIDAZOLAM HCL 2 MG/2ML IJ SOLN
INTRAMUSCULAR | Status: DC | PRN
  Administered 2019-09-23: 1 mg via INTRAVENOUS

## 2019-09-23 SURGICAL SUPPLY — 10 items
CATH INFINITI 5FR ANG PIGTAIL (CATHETERS) ×3 IMPLANT
CATH INFINITI 5FR JL4 (CATHETERS) ×3 IMPLANT
CATH INFINITI 5FR JL5 (CATHETERS) ×3 IMPLANT
CATH INFINITI JR4 5F (CATHETERS) ×3 IMPLANT
DEVICE CLOSURE MYNXGRIP 5F (Vascular Products) ×3 IMPLANT
KIT MANI 3VAL PERCEP (MISCELLANEOUS) ×3 IMPLANT
NEEDLE PERC 18GX7CM (NEEDLE) ×3 IMPLANT
PACK CARDIAC CATH (CUSTOM PROCEDURE TRAY) ×3 IMPLANT
SHEATH AVANTI 5FR X 11CM (SHEATH) ×3 IMPLANT
WIRE GUIDERIGHT .035X150 (WIRE) ×3 IMPLANT

## 2019-09-23 NOTE — H&P (Signed)
Chief Complaint: Chief Complaint  Patient presents with  . New Patient  FU testing  Date of Service: 09/14/2019 Date of Birth: 1955/05/29 PCP: Yevonne Pax, MD  History of Present Illness: [name] is a 64 y.o.male patient with a past medical history significant for pulmonary emphysema, history of a PE, tobacco abuse, and a strong family history of coronary artery disease who presents to establish care. He was recently evaluated by his PCP due to progressive exertional dyspnea. He underwent an ETT as well as an echocardiogram which were both abnormal. He presents today and admits to severe exertional shortness of breath with only mild relief with use Trelegy inhaler. He denies chest pain chest pressure palpitations or heart racing sensation. He denies lower extremity swelling, orthopnea, or PND. He denies syncopal/presyncopal episodes. He has a longstanding history of of tobacco use and is no longer smoking, but continues to use E-cigarettes.   ETT on 09/02/2019 revealed poor exercise tolerance, only achieving 1 minute and 48 seconds on the Bruce protocol. No obvious evidence of ECG changes however test was stopped early due to exertional dyspnea. Echocardiogram revealed mildly to moderately reduced LV systolic function with an EF estimated at 40% with evidence of grade 1 diastolic dysfunction. Global hypocontractility noted. Mild MR, mild TR.  Past Medical and Surgical History  Past Medical History Past Medical History:  Diagnosis Date  . Acquired hypothyroidism, unspecified 08/17/2018  . COPD (chronic obstructive pulmonary disease) (CMS-HCC)  . History of pneumonia 2007  with pulmonary embolism. PE treated for 6 mos.  . History of pulmonary embolism 08/17/2018  . Laryngeal cancer (CMS-HCC) 08/17/2018  PET normal 10/18  . Pulmonary emphysema (CMS-HCC) 08/17/2018  . Tubular adenoma 08/17/2018  11/18 x 1   Past Surgical History He has a past surgical history that includes Colon surgery  (1978); Fissurectomy Anal; Appendectomy; and Indirect Laryngoscopy W/Wo Biopsy.   Medications and Allergies  Current Medications Current Outpatient Medications  Medication Sig Dispense Refill  . albuterol (VENTOLIN HFA) 90 mcg/actuation inhaler Inhale 2 inhalations into the lungs 3 (three) times daily as needed for Wheezing 1 Inhaler 11  . cyanocobalamin (VITAMIN B12) 1000 MCG tablet Take 1,000 mcg by mouth twice a week  . fluticasone-umeclidinium-vlianterol (TRELEGY ELLIPTA) 200-62.5-25 mcg inhaler Inhale 1 inhalation into the lungs once daily 1 each 11  . levothyroxine (SYNTHROID) 112 MCG tablet Take 1 tablet (112 mcg total) by mouth once daily 90 tablet 3  . olmesartan (BENICAR) 20 MG tablet Take 1 tablet (20 mg total) by mouth once daily 30 tablet 11  . tamsulosin (FLOMAX) 0.4 mg capsule Take 1 capsule (0.4 mg total) by mouth once daily 90 capsule 3  . metoprolol succinate (TOPROL-XL) 25 MG XL tablet Take 1 tablet (25 mg total) by mouth once daily 30 tablet 11   No current facility-administered medications for this visit.   Allergies Patient has no known allergies.  Social and Family History  Social History reports that he quit smoking about 6 years ago. He has never used smokeless tobacco. He reports previous alcohol use.  Family History family history includes Breast cancer in his sister; Cancer in his father; Diabetes in his brother; High blood pressure (Hypertension) in his brother and sister; Hyperlipidemia (Elevated cholesterol) in his brother; No Known Problems in his brother and mother.   Review of Systems   Review of Systems: The patient denies chest pain, shortness of breath, orthopnea, paroxysmal nocturnal dyspnea, pedal edema, palpitations, heart racing, fatigue, dizziness, lightheadedness, presyncope, syncope, leg  pain, leg cramping. Review of 12 Systems is negative except as described in HPI.  Physical Examination   Vitals:BP 120/72  Pulse 81  Ht 167.6 cm (5\' 6" )   Wt 75.8 kg (167 lb)  SpO2 97%  BMI 26.95 kg/m  Ht:167.6 cm (5\' 6" ) Wt:75.8 kg (167 lb) [id number] surface area is 1.88 meters squared. Body mass index is 26.95 kg/m.  General: Well developed, well nourished. In no acute distress HEENT: Pupils equally reactive to light and accomodation  Neck: Supple without thyromegaly, or goiter. Carotid pulses 2+. No carotid bruits present.  Pulmonary: Clear to auscultation bilaterally; no wheezes, rales, rhonchi Cardiovascular: Regular rate and rhythm. No gallops, murmurs or rubs Gastrointestinal: Soft nontender, nondistended, with normal bowel sounds Extremities: No cyanosis, clubbing, or edema Peripheral Pulses: 2+ in upper extremities, 2+ in lower extremities  Neurology: Alert and oriented X3 Pysch: Good affect. Responds appropriately  Assessment and Plan   64 y.o. male with  1. Family history of coronary artery disease  -See below  2. History of tobacco abuse  -Continue to refrain from all forms of tobacco/vaping  3. Panlobular emphysema (CMS-HCC)  -Continue to refrain from smoking  -F/u with PCP/pulmonology as indicated  4. History of pulmonary embolism  -In the distant past; currently not on anticoagulation  5. Abnormal cardiovascular stress test  -With abnormal stress test, reduced LV systolic function, history of tobacco abuse, and strong family history of coronary artery disease, will further assess with left heart cardiac catheterization -We had a long discussion regarding benefits and risks of a left heart catheterization including risks of bleeding, infection, pseudoaneurysm, heart attack, stroke, and death. Patient aware of the various outcomes of the procedure including recommendation for medical management, the need for coronary intervention, and potential referral for CABG. Patient voiced understanding and wishes to proceed with the procedure as discussed -Will start metoprolol succinate 25mg  for reduced LV systolic function and  continue olmesartan 20mg  daily     Orders Placed This Encounter  Procedures  . Complete Blood Count (CBC)  . Basic Metabolic Panel (BMP)   Return for after cath.   Javier Docker Danyal Adorno MD  Pt seen and examined. No change from above

## 2019-09-24 ENCOUNTER — Encounter: Payer: Self-pay | Admitting: Cardiology

## 2019-10-19 ENCOUNTER — Other Ambulatory Visit: Payer: Self-pay

## 2019-10-19 ENCOUNTER — Ambulatory Visit: Payer: Self-pay | Admitting: Hematology and Oncology

## 2019-11-02 ENCOUNTER — Other Ambulatory Visit: Payer: Self-pay

## 2019-11-02 DIAGNOSIS — D124 Benign neoplasm of descending colon: Secondary | ICD-10-CM

## 2019-11-02 NOTE — Progress Notes (Signed)
The patient b/p was 98/67 the patient reports that his Cardiology just change his medication yesterday. The patient Name and DOB has been verified by phone today.

## 2019-11-02 NOTE — Progress Notes (Signed)
[facility] [address]  [address] [place], [place] [address] Phone: [phone]  Fax: [phone]   Clinic Day:  11/03/2019  Referring physician: Cletis Athens, MD  Chief Complaint: Benjamin Torres is a 64 y.o. 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   HPI: The patient was last seen in the oncology clinic on 10/19/2018 via telemedicine. At that time, he was doing well and voiced no concerns. Hematocrit was 44.9, hemoglobin 15.0, platelets 216,000, WBC 6,700. Calcium was 8.7. TSH was 2.383 and free T4 was 1.12.  We discussed the low dose chest CT program.  ECHO on 09/02/2019 revealed EF of 40% (estimated).  The patient established care with Dr. Minette Brine, cardiology, on 09/14/2019 due to progressive exertional dyspnea.  He was started on metoprolol 25 mg and continued olmesartan 20 mg.   Left heart catheterization and coronary angiography on 09/23/2019 revealed a chronically occluded proximal RCA lesion with collaterals, a first marginal lesion that was chronically occluded, and a second diagonal lesion that was 75% stenosed. Medical management was recommended.  Labs have been followed: 08/17/2019: Hematocrit 46.8, hemoglobin 15.3, platelets 242,000, WBC 8,000. Normal CMP. TSH was 1.008. 09/14/2019: Hematocrit 46.9, hemoglobin 15.4, platelets 219,000, WBC 8,800. Normal BMP.  During the interim, he has been okay. He notes some shortness of breath due to one of his medications. He denies any throat problems. He last saw ENT a couple of years ago. His dry mouth has resolved. He sees a urologist regularly.  He lost his job during the interim and his insurance changed so he did not proceed with the low dose chest CT program.  The patient received the [place] COVID-19 vaccine on 08/06/2019 and 08/31/2019.  He would like to proceed with yearly check ups.   Past Medical History:  Diagnosis Date  . Cancer of  larynx ([place]) 11/06/2014  . COPD (chronic obstructive pulmonary disease) ([place])   . Hypothyroidism   . PE (pulmonary thromboembolism) ([place])    right lung, over 10 yrs ago    Past Surgical History:  Procedure Laterality Date  . ANAL FISSURE REPAIR    . APPENDECTOMY    . BIOPSY PHARYNX  2015   Larynx BX  . COLONOSCOPY WITH PROPOFOL N/A 03/07/2017   Procedure: COLONOSCOPY WITH PROPOFOL;  Surgeon: Lucilla Lame, MD;  Location: [facility];  Service: Endoscopy;  Laterality: N/A;  . ESOPHAGOGASTRODUODENOSCOPY (EGD) WITH PROPOFOL N/A 03/07/2017   Procedure: ESOPHAGOGASTRODUODENOSCOPY (EGD) WITH PROPOFOL;  Surgeon: Lucilla Lame, MD;  Location: [facility];  Service: Endoscopy;  Laterality: N/A;  . LEFT HEART CATH AND CORONARY ANGIOGRAPHY Left 09/23/2019   Procedure: LEFT HEART CATH AND CORONARY ANGIOGRAPHY;  Surgeon: Teodoro Spray, MD;  Location: [facility];  Service: Cardiovascular;  Laterality: Left;  . POLYPECTOMY  03/07/2017   Procedure: POLYPECTOMY;  Surgeon: Lucilla Lame, MD;  Location: [facility];  Service: Endoscopy;;    No family history on file.  Social History:  reports that he has been smoking e-cigarettes. He has a 60.00 pack-year smoking history. He has never used smokeless tobacco. He reports that he does not drink alcohol. No history on file for drug use. He likes to ride motorcycles.  He likes to play soft ball.  He works a Armed forces logistics/support/administrative officer. The patient is alone today.  Allergies: No Known Allergies  Current Medications: Current Outpatient Medications  Medication Sig Dispense Refill  . acetaminophen (TYLENOL) 500 MG tablet  Take 500-1,000 mg by mouth every 6 (six) hours as needed (for pain.).    Marland Kitchen albuterol (PROVENTIL HFA;VENTOLIN HFA) 108 (90 Base) MCG/ACT inhaler Inhale 2 puffs every 6 (six) hours as needed into the lungs for wheezing or shortness of breath.    . levothyroxine (SYNTHROID) 112 MCG tablet TAKE 1 TABLET (112 MCG TOTAL) BY MOUTH DAILY  BEFORE BREAKFAST. 90 tablet 0  . metoprolol succinate (TOPROL-XL) 25 MG 24 hr tablet Take 25 mg by mouth daily.    Marland Kitchen olmesartan (BENICAR) 20 MG tablet Take 20 mg by mouth daily.    . tamsulosin (FLOMAX) 0.4 MG CAPS capsule Take 1 capsule (0.4 mg total) by mouth daily. 90 capsule 3  . TRELEGY ELLIPTA 200-62.5-25 MCG/INH AEPB Inhale 1 puff into the lungs daily at 2 PM.     . vitamin B-12 (CYANOCOBALAMIN) 1000 MCG tablet Take 1,000 mcg by mouth 2 (two) times a week.     No current facility-administered medications for this visit.   Facility-Administered Medications Ordered in Other Visits  Medication Dose Route Frequency Provider Last Rate Last Admin  . sodium chloride flush (NS) 0.9 % injection 3 mL  3 mL Intravenous Q12H Teodoro Spray, MD        Review of Systems  Constitutional: Negative.  Negative for chills, diaphoresis, fever, malaise/fatigue and weight loss (up 10 pound).       Feels "fine".  HENT: Negative.  Negative for congestion, ear discharge, ear pain, hearing loss, nosebleeds, sinus pain, sore throat and tinnitus.   Eyes: Negative.  Negative for blurred vision, double vision, photophobia and pain.  Respiratory: Positive for shortness of breath. Negative for cough, hemoptysis, sputum production and wheezing.   Cardiovascular: Negative.  Negative for chest pain, palpitations, orthopnea, leg swelling and PND.  Gastrointestinal: Negative.  Negative for abdominal pain, blood in stool, constipation, diarrhea, heartburn, melena, nausea and vomiting.  Genitourinary: Negative.  Negative for dysuria, frequency, hematuria and urgency.       LUTS; difficulty initiating stream, voids small amounts, nocturia. Known prostatomegaly and posterior bladder diverticulum; sees urology  Musculoskeletal: Negative.  Negative for back pain, falls, joint pain, myalgias and neck pain.  Skin: Negative.  Negative for itching and rash.  Neurological: Negative.  Negative for dizziness, tremors, speech  change, focal weakness, weakness and headaches.  Endo/Heme/Allergies: Negative.  Does not bruise/bleed easily.       HYPOthyroidism on levothyroxine.  Psychiatric/Behavioral: Negative for depression, memory loss and suicidal ideas. The patient is not nervous/anxious and does not have insomnia.   All other systems reviewed and are negative.   Performance status (ECOG): 0  Blood pressure 98/67, pulse 70, temperature 97.6 F (36.4 C), temperature source Tympanic, resp. rate 18, height 5\' 7"  (1.702 m), SpO2 100 %.   Physical Exam Vitals and nursing note reviewed.  Constitutional:      General: He is not in acute distress.    Appearance: He is well-developed.  HENT:     Head: Normocephalic and atraumatic.     Mouth/Throat:     Mouth: Mucous membranes are moist.     Pharynx: Oropharynx is clear.     Comments: Hoarse voice Eyes:     General: No scleral icterus.    Extraocular Movements: Extraocular movements intact.     Conjunctiva/sclera: Conjunctivae normal.     Pupils: Pupils are equal, round, and reactive to light.     Comments: Glasses.  Brown eyes.  Cardiovascular:     Rate and Rhythm: Normal rate  and regular rhythm.     Heart sounds: Normal heart sounds. No murmur heard.   Pulmonary:     Effort: Pulmonary effort is normal. No respiratory distress.     Breath sounds: Normal breath sounds. No wheezing or rales.  Chest:     Chest wall: No tenderness.  Abdominal:     General: Bowel sounds are normal. There is no distension.     Palpations: Abdomen is soft. There is no mass.     Tenderness: There is no abdominal tenderness. There is no guarding or rebound.  Musculoskeletal:        General: No swelling or tenderness. Normal range of motion.     Cervical back: Normal range of motion and neck supple.  Lymphadenopathy:     Head:     Right side of head: No preauricular, posterior auricular or occipital adenopathy.     Left side of head: No preauricular, posterior auricular or  occipital adenopathy.     Cervical: No cervical adenopathy.     Upper Body:     Right upper body: No supraclavicular adenopathy.     Left upper body: No supraclavicular adenopathy.     Lower Body: No right inguinal adenopathy. No left inguinal adenopathy.  Skin:    General: Skin is warm and dry.  Neurological:     Mental Status: He is alert and oriented to person, place, and time.  Psychiatric:        Behavior: Behavior normal.        Thought Content: Thought content normal.        Judgment: Judgment normal.      No visits with results within 3 Day(s) from this visit.  Latest known visit with results is:  Hospital Outpatient Visit on 09/21/2019  Component Date Value Ref Range Status  . SARS Coronavirus 2 09/21/2019 NEGATIVE  NEGATIVE Final   Comment: (NOTE) SARS-CoV-2 target nucleic acids are NOT DETECTED. The SARS-CoV-2 RNA is generally detectable in upper and lower respiratory specimens during the acute phase of infection. Negative results do not preclude SARS-CoV-2 infection, do not rule out co-infections with other pathogens, and should not be used as the sole basis for treatment or other patient management decisions. Negative results must be combined with clinical observations, patient history, and epidemiological information. The expected result is Negative. Fact Sheet for Patients: SugarRoll.be Fact Sheet for Healthcare Providers: https://www.woods-mathews.com/ This test is not yet approved or cleared by the [place] FDA and  has been authorized for detection and/or diagnosis of SARS-CoV-2 by FDA under an Emergency Use Authorization (EUA). This EUA will remain  in effect (meaning this test can be used) for the duration of the COVID-19 declaration under Section 56                          4(b)(1) of the Act, 21 U.S.C. section 360bbb-3(b)(1), unless the authorization is terminated or revoked sooner. Performed at [facility], [place] [address], [place], [place] [address]     Assessment:  Benjamin Torres is a 64 y.o. male with stage III supraglottic larynx cancer s/p concurrent cetuximab and radiation.  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 PET scan on 08/18/2013 revealed hypermetabolic soft tissue lesion in the right hypopharynx consistent with epiglottic lesion. There also was a high right paratracheal lymph node which was also hypermetabolic and suspicious for metastatic disease.  Clinical stage was T2N1M0.  He was treated with  radiation therapy and cetuximab beginning 09/07/2013.  He received 7,000 cGy from 09/07/2013 - 11/10/2013.    PET scan on 06/21/2015 revealed no evidence of recurrent disease.  PET scan on 02/12/2017 revealed no evidence of hypermetabolic locoregional or distant metastatic disease.  ENT evaluation in 11/2016 was negative per patient.  He has hypothyroidism.  He is on Synthroid.  He has a posterior bladder diverticulum and prostatomegaly, which causes him to experience LUTS. PSA was 1.91 on 01/13/2017.  EGD on 03/07/2017 revealed a medium sized hiatal hernia, moderate inflammation in the stomach, and mild inflammation in the duodenal bulb. Findings c/w gastritis and duodenitis. Pathology demonstrated chronic inactive gastritis. Negative for H.pylori.   Colonoscopy on 03/07/2017 revealed two 4-5 mm sessile polyps in the descending colon, one 2 mm polyp in the sigmoid colon, diverticulosis, and non-bleeding internal hemorrhoids. Pathology showed that the 2 polyps from the descending colon were tubular adenomas, and the lesion from the sigmoid was a hyperplastic polyp.   The patient received the [place] COVID-19 vaccine on 08/06/2019 and 08/31/2019.  Symptomatically, he is doing well.  He denies any xerostomia or mouth/throat issues.  Voice has changed.  Plan: 1.   Labs today:  CBC with diff, CMP, TSH, free T4. 2.   Stage III supraglottic  larynx cancer             Clinically, he appears to be doing well.  He is 6 years s/p completion of concurrent chemotherapy and radiation.    Discuss imaging prompted by any worrisome signs/symptoms or areas inaccessible to direct visualization.    Patient wishes to be followed annually in the oncology clinic 3.   Smoking history             Patient remains interested in the low-dose chest CT screening program.  RN to contact Burgess Estelle re: low dose chest CT program. 4.   Hypothyroidism  Hypothyroid s/p radiation.  Patient currently on Synthroid 112 mcg a day.  TSH 0.199 (low) and free T4 1.35 (high).  Adjust Synthroid dose. 5.   RTC in 1 year for MD assessment and labs (CBC with diff, CMP, TSH, free T4).   I discussed the assessment and treatment plan with the patient.  The patient was provided an opportunity to ask questions and all were answered.  The patient agreed with the plan and demonstrated an understanding of the instructions.  The patient was advised to call back or seek an in person evaluation if the symptoms worsen or if the condition fails to improve as anticipated.   Nolon Stalls, MD, PhD  11/03/2019, 1:04 PM  I, Mirian Mo Tufford, am acting as Education administrator for [employer]. Mike Gip, MD, PhD.  I, Jamone Garrido C. Mike Gip, MD, have reviewed the above documentation for accuracy and completeness, and I agree with the above.

## 2019-11-03 ENCOUNTER — Inpatient Hospital Stay (HOSPITAL_BASED_OUTPATIENT_CLINIC_OR_DEPARTMENT_OTHER): Payer: Medicare HMO | Admitting: Hematology and Oncology

## 2019-11-03 ENCOUNTER — Encounter: Payer: Self-pay | Admitting: Hematology and Oncology

## 2019-11-03 ENCOUNTER — Inpatient Hospital Stay: Payer: Medicare HMO | Attending: Hematology and Oncology

## 2019-11-03 ENCOUNTER — Other Ambulatory Visit: Payer: Self-pay

## 2019-11-03 VITALS — BP 98/67 | HR 70 | Temp 97.6°F | Resp 18 | Ht 67.0 in

## 2019-11-03 DIAGNOSIS — Z87891 Personal history of nicotine dependence: Secondary | ICD-10-CM

## 2019-11-03 DIAGNOSIS — Z9221 Personal history of antineoplastic chemotherapy: Secondary | ICD-10-CM | POA: Insufficient documentation

## 2019-11-03 DIAGNOSIS — Z923 Personal history of irradiation: Secondary | ICD-10-CM | POA: Diagnosis not present

## 2019-11-03 DIAGNOSIS — C329 Malignant neoplasm of larynx, unspecified: Secondary | ICD-10-CM

## 2019-11-03 DIAGNOSIS — Z8521 Personal history of malignant neoplasm of larynx: Secondary | ICD-10-CM | POA: Insufficient documentation

## 2019-11-03 DIAGNOSIS — F17290 Nicotine dependence, other tobacco product, uncomplicated: Secondary | ICD-10-CM | POA: Insufficient documentation

## 2019-11-03 DIAGNOSIS — E039 Hypothyroidism, unspecified: Secondary | ICD-10-CM | POA: Insufficient documentation

## 2019-11-03 DIAGNOSIS — D124 Benign neoplasm of descending colon: Secondary | ICD-10-CM

## 2019-11-03 DIAGNOSIS — E038 Other specified hypothyroidism: Secondary | ICD-10-CM | POA: Diagnosis not present

## 2019-11-03 LAB — COMPREHENSIVE METABOLIC PANEL
ALT: 22 U/L (ref 0–44)
AST: 20 U/L (ref 15–41)
Albumin: 4.1 g/dL (ref 3.5–5.0)
Alkaline Phosphatase: 51 U/L (ref 38–126)
Anion gap: 7 (ref 5–15)
BUN: 13 mg/dL (ref 8–23)
CO2: 27 mmol/L (ref 22–32)
Calcium: 9 mg/dL (ref 8.9–10.3)
Chloride: 102 mmol/L (ref 98–111)
Creatinine, Ser: 0.88 mg/dL (ref 0.61–1.24)
GFR calc Af Amer: >60 mL/min (ref >60)
GFR calc non Af Amer: >60 mL/min (ref >60)
Glucose, Bld: 106 mg/dL — ABNORMAL HIGH (ref 70–99)
Potassium: 4.2 mmol/L (ref 3.5–5.1)
Sodium: 136 mmol/L (ref 135–145)
Total Bilirubin: 0.8 mg/dL (ref 0.3–1.2)
Total Protein: 7.1 g/dL (ref 6.5–8.1)

## 2019-11-03 LAB — CBC WITH DIFFERENTIAL/PLATELET
Abs Immature Granulocytes: 0.02 10*3/uL (ref 0.00–0.07)
Basophils Absolute: 0 10*3/uL (ref 0.0–0.1)
Basophils Relative: 0 %
Eosinophils Absolute: 0.2 10*3/uL (ref 0.0–0.5)
Eosinophils Relative: 2 %
HCT: 43.2 % (ref 39.0–52.0)
Hemoglobin: 14.5 g/dL (ref 13.0–17.0)
Immature Granulocytes: 0 %
Lymphocytes Relative: 19 %
Lymphs Abs: 1.4 10*3/uL (ref 0.7–4.0)
MCH: 28.8 pg (ref 26.0–34.0)
MCHC: 33.6 g/dL (ref 30.0–36.0)
MCV: 85.9 fL (ref 80.0–100.0)
Monocytes Absolute: 0.7 10*3/uL (ref 0.1–1.0)
Monocytes Relative: 10 %
Neutro Abs: 5.2 10*3/uL (ref 1.7–7.7)
Neutrophils Relative %: 69 %
Platelets: 228 10*3/uL (ref 150–400)
RBC: 5.03 MIL/uL (ref 4.22–5.81)
RDW: 12.8 % (ref 11.5–15.5)
WBC: 7.6 10*3/uL (ref 4.0–10.5)
nRBC: 0 % (ref 0.0–0.2)

## 2019-11-03 LAB — TSH: TSH: 0.199 u[IU]/mL — ABNORMAL LOW (ref 0.350–4.500)

## 2019-11-03 LAB — T4, FREE: Free T4: 1.35 ng/dL — ABNORMAL HIGH (ref 0.61–1.12)

## 2019-11-09 DIAGNOSIS — Z87891 Personal history of nicotine dependence: Secondary | ICD-10-CM | POA: Insufficient documentation

## 2019-12-02 ENCOUNTER — Telehealth: Payer: Self-pay | Admitting: *Deleted

## 2019-12-02 NOTE — Telephone Encounter (Signed)
Received referral for low dose lung cancer screening CT scan. Message left at phone number listed in EMR for patient to call me back to facilitate scheduling scan.  

## 2020-01-07 ENCOUNTER — Telehealth: Payer: Self-pay

## 2020-01-07 NOTE — Telephone Encounter (Signed)
Attempted to reach patient again for lung CT screening clinic after receiving referral from Dr. Mike Gip.  Message left for patient to call Burgess Estelle, lung navigator to schedule CT scan.

## 2020-01-19 ENCOUNTER — Encounter: Payer: Self-pay | Admitting: *Deleted

## 2020-07-24 DIAGNOSIS — R7309 Other abnormal glucose: Secondary | ICD-10-CM | POA: Diagnosis not present

## 2020-07-24 DIAGNOSIS — Z125 Encounter for screening for malignant neoplasm of prostate: Secondary | ICD-10-CM | POA: Diagnosis not present

## 2020-07-24 DIAGNOSIS — E538 Deficiency of other specified B group vitamins: Secondary | ICD-10-CM | POA: Diagnosis not present

## 2020-07-24 DIAGNOSIS — E782 Mixed hyperlipidemia: Secondary | ICD-10-CM | POA: Diagnosis not present

## 2020-07-31 DIAGNOSIS — E538 Deficiency of other specified B group vitamins: Secondary | ICD-10-CM | POA: Diagnosis not present

## 2020-07-31 DIAGNOSIS — J449 Chronic obstructive pulmonary disease, unspecified: Secondary | ICD-10-CM | POA: Diagnosis not present

## 2020-07-31 DIAGNOSIS — Z0000 Encounter for general adult medical examination without abnormal findings: Secondary | ICD-10-CM | POA: Diagnosis not present

## 2020-07-31 DIAGNOSIS — I2510 Atherosclerotic heart disease of native coronary artery without angina pectoris: Secondary | ICD-10-CM | POA: Diagnosis not present

## 2020-07-31 DIAGNOSIS — I429 Cardiomyopathy, unspecified: Secondary | ICD-10-CM | POA: Diagnosis not present

## 2020-07-31 DIAGNOSIS — Z8521 Personal history of malignant neoplasm of larynx: Secondary | ICD-10-CM | POA: Diagnosis not present

## 2020-08-09 ENCOUNTER — Inpatient Hospital Stay
Admission: EM | Admit: 2020-08-09 | Discharge: 2020-08-11 | DRG: 871 | Disposition: A | Discharge status: Home / Self Care | Payer: Medicare HMO | Attending: Hospitalist | Admitting: Hospitalist

## 2020-08-09 ENCOUNTER — Emergency Department: Payer: Medicare HMO

## 2020-08-09 ENCOUNTER — Other Ambulatory Visit: Payer: Self-pay

## 2020-08-09 DIAGNOSIS — Z86711 Personal history of pulmonary embolism: Secondary | ICD-10-CM | POA: Diagnosis not present

## 2020-08-09 DIAGNOSIS — Z791 Long term (current) use of non-steroidal anti-inflammatories (NSAID): Secondary | ICD-10-CM | POA: Diagnosis not present

## 2020-08-09 DIAGNOSIS — E785 Hyperlipidemia, unspecified: Secondary | ICD-10-CM | POA: Diagnosis present

## 2020-08-09 DIAGNOSIS — R0789 Other chest pain: Secondary | ICD-10-CM | POA: Diagnosis not present

## 2020-08-09 DIAGNOSIS — Z20822 Contact with and (suspected) exposure to covid-19: Secondary | ICD-10-CM | POA: Diagnosis not present

## 2020-08-09 DIAGNOSIS — E039 Hypothyroidism, unspecified: Secondary | ICD-10-CM | POA: Diagnosis present

## 2020-08-09 DIAGNOSIS — Z79890 Hormone replacement therapy (postmenopausal): Secondary | ICD-10-CM | POA: Diagnosis not present

## 2020-08-09 DIAGNOSIS — N400 Enlarged prostate without lower urinary tract symptoms: Secondary | ICD-10-CM | POA: Diagnosis present

## 2020-08-09 DIAGNOSIS — R079 Chest pain, unspecified: Secondary | ICD-10-CM | POA: Diagnosis not present

## 2020-08-09 DIAGNOSIS — I2582 Chronic total occlusion of coronary artery: Secondary | ICD-10-CM | POA: Diagnosis present

## 2020-08-09 DIAGNOSIS — Z79899 Other long term (current) drug therapy: Secondary | ICD-10-CM

## 2020-08-09 DIAGNOSIS — I2510 Atherosclerotic heart disease of native coronary artery without angina pectoris: Secondary | ICD-10-CM | POA: Diagnosis present

## 2020-08-09 DIAGNOSIS — I10 Essential (primary) hypertension: Secondary | ICD-10-CM | POA: Diagnosis present

## 2020-08-09 DIAGNOSIS — R231 Pallor: Secondary | ICD-10-CM | POA: Diagnosis not present

## 2020-08-09 DIAGNOSIS — J168 Pneumonia due to other specified infectious organisms: Secondary | ICD-10-CM | POA: Diagnosis not present

## 2020-08-09 DIAGNOSIS — I4891 Unspecified atrial fibrillation: Secondary | ICD-10-CM | POA: Diagnosis not present

## 2020-08-09 DIAGNOSIS — R0602 Shortness of breath: Secondary | ICD-10-CM | POA: Diagnosis not present

## 2020-08-09 DIAGNOSIS — Z8521 Personal history of malignant neoplasm of larynx: Secondary | ICD-10-CM | POA: Diagnosis not present

## 2020-08-09 DIAGNOSIS — J441 Chronic obstructive pulmonary disease with (acute) exacerbation: Secondary | ICD-10-CM | POA: Diagnosis not present

## 2020-08-09 DIAGNOSIS — J9601 Acute respiratory failure with hypoxia: Secondary | ICD-10-CM | POA: Diagnosis present

## 2020-08-09 DIAGNOSIS — F17290 Nicotine dependence, other tobacco product, uncomplicated: Secondary | ICD-10-CM | POA: Diagnosis present

## 2020-08-09 DIAGNOSIS — R6520 Severe sepsis without septic shock: Secondary | ICD-10-CM | POA: Diagnosis not present

## 2020-08-09 DIAGNOSIS — K449 Diaphragmatic hernia without obstruction or gangrene: Secondary | ICD-10-CM | POA: Diagnosis not present

## 2020-08-09 DIAGNOSIS — A419 Sepsis, unspecified organism: Principal | ICD-10-CM | POA: Diagnosis present

## 2020-08-09 DIAGNOSIS — I420 Dilated cardiomyopathy: Secondary | ICD-10-CM | POA: Diagnosis present

## 2020-08-09 DIAGNOSIS — J189 Pneumonia, unspecified organism: Secondary | ICD-10-CM | POA: Diagnosis present

## 2020-08-09 DIAGNOSIS — R42 Dizziness and giddiness: Secondary | ICD-10-CM | POA: Diagnosis not present

## 2020-08-09 DIAGNOSIS — J440 Chronic obstructive pulmonary disease with acute lower respiratory infection: Secondary | ICD-10-CM | POA: Diagnosis not present

## 2020-08-09 DIAGNOSIS — Z8249 Family history of ischemic heart disease and other diseases of the circulatory system: Secondary | ICD-10-CM | POA: Diagnosis not present

## 2020-08-09 LAB — CBC WITH DIFFERENTIAL/PLATELET
Abs Immature Granulocytes: 0.08 10*3/uL — ABNORMAL HIGH (ref 0.00–0.07)
Basophils Absolute: 0 10*3/uL (ref 0.0–0.1)
Basophils Relative: 0 %
Eosinophils Absolute: 0 10*3/uL (ref 0.0–0.5)
Eosinophils Relative: 0 %
HCT: 47.2 % (ref 39.0–52.0)
Hemoglobin: 16 g/dL (ref 13.0–17.0)
Immature Granulocytes: 0 %
Lymphocytes Relative: 4 %
Lymphs Abs: 0.7 10*3/uL (ref 0.7–4.0)
MCH: 29.4 pg (ref 26.0–34.0)
MCHC: 33.9 g/dL (ref 30.0–36.0)
MCV: 86.8 fL (ref 80.0–100.0)
Monocytes Absolute: 1.4 10*3/uL — ABNORMAL HIGH (ref 0.1–1.0)
Monocytes Relative: 7 %
Neutro Abs: 16.3 10*3/uL — ABNORMAL HIGH (ref 1.7–7.7)
Neutrophils Relative %: 89 %
Platelets: 215 10*3/uL (ref 150–400)
RBC: 5.44 MIL/uL (ref 4.22–5.81)
RDW: 12.6 % (ref 11.5–15.5)
WBC: 18.4 10*3/uL — ABNORMAL HIGH (ref 4.0–10.5)
nRBC: 0 % (ref 0.0–0.2)

## 2020-08-09 LAB — URINALYSIS, COMPLETE (UACMP) WITH MICROSCOPIC
Bacteria, UA: NONE SEEN
Bilirubin Urine: NEGATIVE
Glucose, UA: NEGATIVE mg/dL
Ketones, ur: 20 mg/dL — AB
Leukocytes,Ua: NEGATIVE
Nitrite: NEGATIVE
Protein, ur: 100 mg/dL — AB
Specific Gravity, Urine: 1.018 (ref 1.005–1.030)
pH: 5 (ref 5.0–8.0)

## 2020-08-09 LAB — COMPREHENSIVE METABOLIC PANEL
ALT: 20 U/L (ref 0–44)
AST: 23 U/L (ref 15–41)
Albumin: 4.2 g/dL (ref 3.5–5.0)
Alkaline Phosphatase: 55 U/L (ref 38–126)
Anion gap: 11 (ref 5–15)
BUN: 20 mg/dL (ref 8–23)
CO2: 20 mmol/L — ABNORMAL LOW (ref 22–32)
Calcium: 9.2 mg/dL (ref 8.9–10.3)
Chloride: 105 mmol/L (ref 98–111)
Creatinine, Ser: 0.74 mg/dL (ref 0.61–1.24)
GFR, Estimated: >60 mL/min (ref >60)
Glucose, Bld: 155 mg/dL — ABNORMAL HIGH (ref 70–99)
Potassium: 4.2 mmol/L (ref 3.5–5.1)
Sodium: 136 mmol/L (ref 135–145)
Total Bilirubin: 1.1 mg/dL (ref 0.3–1.2)
Total Protein: 7.6 g/dL (ref 6.5–8.1)

## 2020-08-09 LAB — PROTIME-INR
INR: 1.1 (ref 0.8–1.2)
Prothrombin Time: 14.6 seconds (ref 11.4–15.2)

## 2020-08-09 LAB — PROCALCITONIN: Procalcitonin: <0.1 ng/mL

## 2020-08-09 LAB — LACTIC ACID, PLASMA
Lactic Acid, Venous: 1.9 mmol/L (ref 0.5–1.9)
Lactic Acid, Venous: 1.1 mmol/L (ref 0.5–1.9)

## 2020-08-09 LAB — TROPONIN I (HIGH SENSITIVITY)
Troponin I (High Sensitivity): 9 ng/L (ref <18)
Troponin I (High Sensitivity): 20 ng/L — ABNORMAL HIGH (ref <18)

## 2020-08-09 LAB — RESP PANEL BY RT-PCR (FLU A&B, COVID) ARPGX2
Influenza A by PCR: NEGATIVE
Influenza B by PCR: NEGATIVE
SARS Coronavirus 2 by RT PCR: NEGATIVE

## 2020-08-09 LAB — BRAIN NATRIURETIC PEPTIDE: B Natriuretic Peptide: 37.4 pg/mL (ref 0.0–100.0)

## 2020-08-09 MED ORDER — SODIUM CHLORIDE 0.9 % IV BOLUS
1000.0000 mL | Freq: Once | INTRAVENOUS | Status: AC
  Administered 2020-08-09: 1000 mL via INTRAVENOUS

## 2020-08-09 MED ORDER — SODIUM CHLORIDE 0.9 % IV SOLN
500.0000 mg | INTRAVENOUS | Status: DC
  Administered 2020-08-10: 13:00:00 500 mg via INTRAVENOUS
  Filled 2020-08-09 (×2): qty 500

## 2020-08-09 MED ORDER — METOPROLOL SUCCINATE ER 25 MG PO TB24
25.0000 mg | ORAL_TABLET | Freq: Every day | ORAL | Status: DC
  Administered 2020-08-09 – 2020-08-11 (×3): 25 mg via ORAL
  Filled 2020-08-09 (×3): qty 1

## 2020-08-09 MED ORDER — IPRATROPIUM-ALBUTEROL 0.5-2.5 (3) MG/3ML IN SOLN: 3.0000 mL | RESPIRATORY_TRACT | Status: DC | PRN

## 2020-08-09 MED ORDER — VITAMIN B-12 1000 MCG PO TABS
1000.0000 ug | ORAL_TABLET | ORAL | Status: DC
  Administered 2020-08-10: 09:00:00 1000 ug via ORAL
  Filled 2020-08-09: qty 1

## 2020-08-09 MED ORDER — SODIUM CHLORIDE 0.9 % IV SOLN
500.0000 mg | Freq: Once | INTRAVENOUS | Status: AC
Start: 2020-08-09 — End: 2020-08-09
  Administered 2020-08-09: 500 mg via INTRAVENOUS
  Filled 2020-08-09: qty 500

## 2020-08-09 MED ORDER — UMECLIDINIUM BROMIDE 62.5 MCG/INH IN AEPB
1.0000 | INHALATION_SPRAY | Freq: Every day | RESPIRATORY_TRACT | Status: DC
  Administered 2020-08-09 – 2020-08-11 (×3): 1 via RESPIRATORY_TRACT
  Filled 2020-08-09: qty 7

## 2020-08-09 MED ORDER — SODIUM CHLORIDE 0.9 % IV SOLN: INTRAVENOUS | Status: DC

## 2020-08-09 MED ORDER — FLUTICASONE FUROATE-VILANTEROL 200-25 MCG/INH IN AEPB
1.0000 | INHALATION_SPRAY | Freq: Every day | RESPIRATORY_TRACT | Status: DC
  Administered 2020-08-09 – 2020-08-11 (×3): 1 via RESPIRATORY_TRACT
  Filled 2020-08-09: qty 28

## 2020-08-09 MED ORDER — ENOXAPARIN SODIUM 40 MG/0.4ML ~~LOC~~ SOLN
40.0000 mg | SUBCUTANEOUS | Status: DC
  Administered 2020-08-09 – 2020-08-10 (×2): 40 mg via SUBCUTANEOUS
  Filled 2020-08-09 (×2): qty 0.4

## 2020-08-09 MED ORDER — FLUTICASONE-UMECLIDIN-VILANT 200-62.5-25 MCG/INH IN AEPB: 1.0000 | INHALATION_SPRAY | Freq: Every day | RESPIRATORY_TRACT | Status: DC

## 2020-08-09 MED ORDER — ROSUVASTATIN CALCIUM 10 MG PO TABS
10.0000 mg | ORAL_TABLET | Freq: Every day | ORAL | Status: DC
  Administered 2020-08-09 – 2020-08-10 (×2): 10 mg via ORAL
  Filled 2020-08-09 (×3): qty 1

## 2020-08-09 MED ORDER — VANCOMYCIN HCL IN DEXTROSE 1-5 GM/200ML-% IV SOLN
1000.0000 mg | Freq: Once | INTRAVENOUS | Status: AC
  Administered 2020-08-09: 1000 mg via INTRAVENOUS
  Filled 2020-08-09: qty 200

## 2020-08-09 MED ORDER — SODIUM CHLORIDE 0.9 % IV SOLN
2.0000 g | Freq: Once | INTRAVENOUS | Status: AC
  Administered 2020-08-09: 2 g via INTRAVENOUS
  Filled 2020-08-09: qty 20

## 2020-08-09 MED ORDER — ACETAMINOPHEN 500 MG PO TABS
1000.0000 mg | ORAL_TABLET | Freq: Once | ORAL | Status: AC
  Administered 2020-08-09: 1000 mg via ORAL
  Filled 2020-08-09: qty 2

## 2020-08-09 MED ORDER — TAMSULOSIN HCL 0.4 MG PO CAPS
0.4000 mg | ORAL_CAPSULE | Freq: Every day | ORAL | Status: DC
  Administered 2020-08-10 – 2020-08-11 (×2): 0.4 mg via ORAL
  Filled 2020-08-09 (×2): qty 1

## 2020-08-09 MED ORDER — SODIUM CHLORIDE 0.9 % IV SOLN
2.0000 g | INTRAVENOUS | Status: DC
  Administered 2020-08-10: 11:00:00 2 g via INTRAVENOUS
  Filled 2020-08-09: qty 2
  Filled 2020-08-09: qty 20

## 2020-08-09 MED ORDER — LEVOTHYROXINE SODIUM 112 MCG PO TABS
112.0000 ug | ORAL_TABLET | Freq: Every day | ORAL | Status: DC
  Administered 2020-08-10: 112 ug via ORAL
  Filled 2020-08-09 (×2): qty 1

## 2020-08-09 MED ORDER — IPRATROPIUM-ALBUTEROL 0.5-2.5 (3) MG/3ML IN SOLN
3.0000 mL | Freq: Four times a day (QID) | RESPIRATORY_TRACT | Status: DC
  Administered 2020-08-09 – 2020-08-10 (×2): 3 mL via RESPIRATORY_TRACT
  Filled 2020-08-09 (×3): qty 3

## 2020-08-09 MED ORDER — IOHEXOL 350 MG/ML SOLN
75.0000 mL | Freq: Once | INTRAVENOUS | Status: AC | PRN
  Administered 2020-08-09: 75 mL via INTRAVENOUS

## 2020-08-09 NOTE — ED Notes (Signed)
Patient transported to CT 

## 2020-08-09 NOTE — ED Notes (Signed)
Dr funke at bedside 

## 2020-08-09 NOTE — Progress Notes (Signed)
CODE SEPSIS - PHARMACY COMMUNICATION  **Broad Spectrum Antibiotics should be administered within 1 hour of Sepsis diagnosis**  Time Code Sepsis Called/Page Received: 1212  Antibiotics Ordered: ceftriaxone/azithromycin/vancomycin  Time of 1st antibiotic administration: [place] ,PharmD Clinical Pharmacist  08/09/2020  1:21 PM

## 2020-08-09 NOTE — ED Notes (Signed)
RN observed both PIVs to be infiltrated after pt returned from CT.   RN switched infusing line of antibiotics to new PIV started.

## 2020-08-09 NOTE — Progress Notes (Signed)
PHARMACY -  BRIEF ANTIBIOTIC NOTE   Pharmacy has received consult(s) for vancomycin from an ED provider.  The patient's profile has been reviewed for ht/wt/allergies/indication/available labs.    One time order(s) placed for vancomycin 1 g  Further antibiotics/pharmacy consults should be ordered by admitting physician if indicated.                       Thank you,  Tawnya Crook, PharmD 08/09/2020  12:12 PM

## 2020-08-09 NOTE — ED Provider Notes (Signed)
[facility] Emergency Department Provider Note  ____________________________________________   Event Date/Time   First MD Initiated Contact with Patient 08/09/20 1037     (approximate)  I have reviewed the triage vital signs and the nursing notes.   HISTORY  Chief Complaint Shortness of Breath    HPI Benjamin Torres is a 65 y.o. male with history of COPD, prior PE who comes in for shortness of breath.  Patient reports intermittent shortness of breath for the past 4 days, nothing makes it better, worse with exertion.  Denies any leg swelling.  Has an EF of 40% secondary to dilated cardiomyopathy but is not on any Lasix and no evidence of fluid overload.  With EMS patient had increased work of breathing so was placed on 4 L.  Patient denies any chest pain or abdominal pain.  On review of records patient had catheterization done recently.  Patient was found to have a 75% stenosis second diagonal that was recommended to be managed medically.  He also had 100% occlusions that seem more chronic in nature.  Patient uses a trilogy inhaler.   Prox RCA lesion is 100% stenosed. 1st Mrg lesion is 100% stenosed. 2nd Diag lesion is 75% stenosed.  Patient recently taken off metoprolol secondary to a rash and they are going to consider additional treatment in the future            Past Medical History:  Diagnosis Date  . 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 COPD (chronic obstructive pulmonary disease) ([place])   . Hypothyroidism   . PE (pulmonary thromboembolism) ([place])    right lung, over 10 yrs ago    Patient Active Problem List   Diagnosis Date Noted  . Smoking history 11/09/2019  . Unintentional weight loss   . Benign neoplasm of descending colon   . Polyp of sigmoid colon   . Gastritis without bleeding   . Duodenitis   . Weight loss 01/24/2017  . Other specified hypothyroidism 06/22/2016  . 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 Cancer of  larynx ([place]) 11/06/2014    Past Surgical History:  Procedure Laterality Date  . ANAL FISSURE REPAIR    . APPENDECTOMY    . BIOPSY PHARYNX  2015   Larynx BX  . COLONOSCOPY WITH PROPOFOL N/A 03/07/2017   Procedure: COLONOSCOPY WITH PROPOFOL;  Surgeon: Lucilla Lame, MD;  Location: [place];  Service: Endoscopy;  Laterality: N/A;  . ESOPHAGOGASTRODUODENOSCOPY (EGD) WITH PROPOFOL N/A 03/07/2017   Procedure: ESOPHAGOGASTRODUODENOSCOPY (EGD) WITH PROPOFOL;  Surgeon: Lucilla Lame, MD;  Location: [place];  Service: Endoscopy;  Laterality: N/A;  . LEFT HEART CATH AND CORONARY ANGIOGRAPHY Left 09/23/2019   Procedure: LEFT HEART CATH AND CORONARY ANGIOGRAPHY;  Surgeon: Teodoro Spray, MD;  Location: [place] [facility];  Service: Cardiovascular;  Laterality: Left;  . POLYPECTOMY  03/07/2017   Procedure: POLYPECTOMY;  Surgeon: Lucilla Lame, MD;  Location: [place];  Service: Endoscopy;;    Prior to Admission medications   Medication Sig Start Date End Date Taking? Authorizing Provider  acetaminophen (TYLENOL) 500 MG tablet Take 500-1,000 mg by mouth every 6 (six) hours as needed (for pain.).    [provider]  albuterol (PROVENTIL HFA;VENTOLIN HFA) 108 (90 Base) MCG/ACT inhaler Inhale 2 puffs every 6 (six) hours as needed into the lungs for wheezing or shortness of breath.    [provider]  levothyroxine (SYNTHROID) 112 MCG tablet TAKE 1 TABLET (112 MCG TOTAL) BY  MOUTH DAILY BEFORE BREAKFAST. 07/07/19   Lequita Asal, MD  metoprolol succinate (TOPROL-XL) 25 MG 24 hr tablet Take 25 mg by mouth daily. 09/14/19   [provider]  olmesartan (BENICAR) 20 MG tablet Take 20 mg by mouth daily. 08/17/19   [provider]  tamsulosin (FLOMAX) 0.4 MG CAPS capsule Take 1 capsule (0.4 mg total) by mouth daily. 09/22/18   Billey Co, MD  TRELEGY ELLIPTA 200-62.5-25 MCG/INH AEPB Inhale 1 puff into the lungs daily at 2 PM.  08/18/19    [provider]  vitamin B-12 (CYANOCOBALAMIN) 1000 MCG tablet Take 1,000 mcg by mouth 2 (two) times a week.    [provider]    Allergies Patient has no known allergies.  No family history on file.  Social History Social History   Tobacco Use  . Smoking status: Current Some Day Smoker    Packs/day: 1.50    Years: 40.00    Pack years: 60.00    Types: E-cigarettes    Last attempt to quit: 2015    Years since quitting: 7.2  . Smokeless tobacco: Never Used  Vaping Use  . Vaping Use: Some days  Substance Use Topics  . Alcohol use: No    Alcohol/week: 0.0 standard drinks      Review of Systems Constitutional: No fever/chills Eyes: No visual changes. ENT: No sore throat. Cardiovascular: No chest pain Respiratory: Positive for SOB Gastrointestinal: No abdominal pain.  No nausea, no vomiting.  No diarrhea.  No constipation. Genitourinary: Negative for dysuria. Musculoskeletal: Negative for back pain. Skin: Negative for rash. Neurological: Negative for headaches, focal weakness or numbness. All other ROS negative ____________________________________________   PHYSICAL EXAM:  VITAL SIGNS: ED Triage Vitals  Enc Vitals Group     BP --      Pulse Rate 08/09/20 1040 (!) 151     Resp 08/09/20 1040 (!) 30     Temp 08/09/20 1040 100.1 F (37.8 C)     Temp Source 08/09/20 1040 Oral     SpO2 08/09/20 1040 98 %     Weight 08/09/20 1038 160 lb (72.6 kg)     Height 08/09/20 1038 5\' 7"  (1.702 m)     Head Circumference --      Peak Flow --      Pain Score 08/09/20 1038 0     Pain Loc --      Pain Edu? --      Excl. in [place]? --     Constitutional: Alert and oriented. Well appearing and in no acute distress. Eyes: Conjunctivae are normal. EOMI. Head: Atraumatic. Nose: No congestion/rhinnorhea. Mouth/Throat: Mucous membranes are moist.   Neck: No stridor. Trachea Midline. FROM Cardiovascular: Irregular, fast grossly normal heart sounds.  Good peripheral  circulation. Respiratory: Increased work of breathing with tight lungs Gastrointestinal: Soft and nontender. No distention. No abdominal bruits.  Musculoskeletal: No lower extremity tenderness nor edema.  No joint effusions. Neurologic:  Normal speech and language. No gross focal neurologic deficits are appreciated.  Skin:  Skin is warm, dry and intact. No rash noted. Psychiatric: Mood and affect are normal. Speech and behavior are normal. GU: Deferred   ____________________________________________   LABS (all labs ordered are listed, but only abnormal results are displayed)  Labs Reviewed  COMPREHENSIVE METABOLIC PANEL - Abnormal; Notable for the following components:      Result Value   CO2 20 (*)    Glucose, Bld 155 (*)    All other components  within normal limits  CBC WITH DIFFERENTIAL/PLATELET - Abnormal; Notable for the following components:   WBC 18.4 (*)    Neutro Abs 16.3 (*)    Monocytes Absolute 1.4 (*)    Abs Immature Granulocytes 0.08 (*)    All other components within normal limits  URINALYSIS, COMPLETE (UACMP) WITH MICROSCOPIC - Abnormal; Notable for the following components:   Color, Urine YELLOW (*)    APPearance CLEAR (*)    Hgb urine dipstick SMALL (*)    Ketones, ur 20 (*)    Protein, ur 100 (*)    All other components within normal limits  TROPONIN I (HIGH SENSITIVITY) - Abnormal; Notable for the following components:   Troponin I (High Sensitivity) 20 (*)    All other components within normal limits  RESP PANEL BY RT-PCR (FLU A&B, COVID) ARPGX2  CULTURE, BLOOD (ROUTINE X 2)  CULTURE, BLOOD (ROUTINE X 2)  LACTIC ACID, PLASMA  LACTIC ACID, PLASMA  PROTIME-INR  PROCALCITONIN  BRAIN NATRIURETIC PEPTIDE  PROCALCITONIN  CBC WITH DIFFERENTIAL/PLATELET  COMPREHENSIVE METABOLIC PANEL  MAGNESIUM  PHOSPHORUS  TSH  HEMOGLOBIN A1C  TROPONIN I (HIGH SENSITIVITY)   ____________________________________________   ED ECG REPORT I, Vanessa Grady, the  attending physician, personally viewed and interpreted this ECG.  Atrial fibrillation rate of 150, no ST elevation, no T wave inversions, normal intervals ____________________________________________  RADIOLOGY Robert Bellow, personally viewed and evaluated these images (plain radiographs) as part of my medical decision making, as well as reviewing the written report by the radiologist.  ED MD interpretation:  Possible LLL pna  Official radiology report(s): CT Angio Chest PE W and/or Wo Contrast  Result Date: 08/09/2020 CLINICAL DATA:  Chest pain and shortness of breath for 2 days EXAM: CT ANGIOGRAPHY CHEST WITH CONTRAST TECHNIQUE: Multidetector CT imaging of the chest was performed using the standard protocol during bolus administration of intravenous contrast. Multiplanar CT image reconstructions and MIPs were obtained to evaluate the vascular anatomy. CONTRAST:  90mL OMNIPAQUE IOHEXOL 350 MG/ML SOLN COMPARISON:  Chest x-ray from earlier in the same day. FINDINGS: Cardiovascular: Thoracic aorta and its branches are well visualized. No aneurysmal dilatation or dissection is seen. No cardiac enlargement is noted. Coronary calcifications are seen. The pulmonary artery shows a normal branching pattern bilaterally. No filling defect to suggest pulmonary embolism is noted. Mediastinum/Nodes: Thoracic inlet is within normal limits. No sizable hilar or mediastinal adenopathy is noted. The esophagus as visualized is within normal limits. Small sliding-type hiatal hernia is noted. Lungs/Pleura: Lungs show diffuse emphysematous changes. No focal confluent infiltrate or sizable effusion is noted. Mild airspace opacity in the left lower lobe laterally is noted similar to that seen on recent chest x-ray suspicious for early infiltrate. No sizable parenchymal nodules are seen. Upper Abdomen: Visualized upper abdomen demonstrates evidence of cholelithiasis without complicating factors. Musculoskeletal: Degenerative  changes of the thoracic spine are noted. No acute rib abnormality is seen. Review of the MIP images confirms the above findings. IMPRESSION: No evidence of pulmonary embolism. Early infiltrate in the lateral aspect of the left lower lobe. Electronically Signed   By: Inez Catalina M.D.   On: 08/09/2020 13:44   DG Chest Portable 1 View  Result Date: 08/09/2020 CLINICAL DATA:  Shortness of breath EXAM: PORTABLE CHEST 1 VIEW COMPARISON:  05/21/2014 FINDINGS: Airspace opacity noted at the left lung base concerning for pneumonia. Right lung clear. Heart is normal size. No effusions or acute bony abnormality. IMPRESSION: Left basilar airspace opacity concerning for pneumonia.  Electronically Signed   By: Rolm Baptise M.D.   On: 08/09/2020 11:50    ____________________________________________   PROCEDURES  Procedure(s) performed (including Critical Care):  .1-3 Lead EKG Interpretation Performed by: Vanessa Wall, MD Authorized by: Vanessa [place], MD     Interpretation: abnormal     ECG rate:  150s   ECG rate assessment: tachycardic     Rhythm: atrial fibrillation     Ectopy: none     Conduction: normal   Comments:     Pt converted to sinus after IV fluids .Critical Care Performed by: Vanessa [place], MD Authorized by: Vanessa [place], MD   Critical care provider statement:    Critical care time (minutes):  45   Critical care was necessary to treat or prevent imminent or life-threatening deterioration of the following conditions:  Respiratory failure   Critical care was time spent personally by me on the following activities:  Discussions with consultants, evaluation of patient's response to treatment, examination of patient, ordering and performing treatments and interventions, ordering and review of laboratory studies, ordering and review of radiographic studies, pulse oximetry, re-evaluation of patient's condition, obtaining history from patient or surrogate and review of old  charts     ____________________________________________   INITIAL IMPRESSION / ASSESSMENT AND PLAN / ED COURSE   KARTHIKEYA Devanshi Califf was evaluated in Emergency Department on 08/09/2020 for the symptoms described in the history of present illness. He was evaluated in the context of the global COVID-19 pandemic, which necessitated consideration that the patient might be at risk for infection with the SARS-CoV-2 virus that causes COVID-19. Institutional protocols and algorithms that pertain to the evaluation of patients at risk for COVID-19 are in a state of rapid change based on information released by regulatory bodies including the CDC and federal and state organizations. These policies and algorithms were followed during the patient's care in the ED.     Pt presents with SOB.  Patient noted to be in new atrial fibrillation with RVR.  Patient also noted to be febrile at 37.8.  We will give some Tylenol and a little bit of fluids in case this could be secondary to sepsis.   PNA-will get xray to evaluation Anemia-CBC to evaluate ACS- will get trops Arrhythmia-Will get EKG and keep on monitor.  COVID- will get testing per algorithm. PE-lower suspicion given no risk factors and other cause more likely  12:06 PM reevaluated patient.  Lactate is normal.  White count came back at 18 sepsis alert given concern for pneumonia will cover with antibiotics.  However procalcitonin is negative so we will get CT PE to make sure no evidence of pulmonary embolism.  On reevaluation patient's heart rates are now back to normal sinus and patient is feeling better.  Will discuss with hospital team for admission  On re-evaluation lactate normal and BP and HR have normalized. No indication for pressors.  Pt admitted to hospital.               ____________________________________________   FINAL CLINICAL IMPRESSION(S) / ED DIAGNOSES   Final diagnoses:  Sepsis, due to unspecified organism,  unspecified whether acute organ dysfunction present ([place])  Pneumonia of left lower lobe due to infectious organism  Acute respiratory failure with hypoxia ([place])     MEDICATIONS GIVEN DURING THIS VISIT:  Medications  0.9 %  sodium chloride infusion ( Intravenous New Bag/Given 08/09/20 1652)  levothyroxine (SYNTHROID) tablet 112 mcg (has no administration in time range)  metoprolol succinate (TOPROL-XL) 24 hr tablet 25 mg (25 mg Oral Given 08/09/20 1650)  rosuvastatin (CRESTOR) tablet 10 mg (has no administration in time range)  tamsulosin (FLOMAX) capsule 0.4 mg (has no administration in time range)  vitamin B-12 (CYANOCOBALAMIN) tablet 1,000 mcg (has no administration in time range)  ipratropium-albuterol (DUONEB) 0.5-2.5 (3) MG/3ML nebulizer solution 3 mL (3 mLs Nebulization Not Given 08/09/20 1658)  ipratropium-albuterol (DUONEB) 0.5-2.5 (3) MG/3ML nebulizer solution 3 mL (has no administration in time range)  enoxaparin (LOVENOX) injection 40 mg (has no administration in time range)  fluticasone furoate-vilanterol (BREO ELLIPTA) 200-25 MCG/INH 1 puff (1 puff Inhalation Given 08/09/20 1651)    And  umeclidinium bromide (INCRUSE ELLIPTA) 62.5 MCG/INH 1 puff (1 puff Inhalation Given 08/09/20 1651)  sodium chloride 0.9 % bolus 1,000 mL (0 mLs Intravenous Stopped 08/09/20 1151)  acetaminophen (TYLENOL) tablet 1,000 mg (1,000 mg Oral Given 08/09/20 1056)  vancomycin (VANCOCIN) IVPB 1000 mg/200 mL premix (1,000 mg Intravenous New Bag/Given 08/09/20 1450)  cefTRIAXone (ROCEPHIN) 2 g in sodium chloride 0.9 % 100 mL IVPB (0 g Intravenous Stopped 08/09/20 1255)  azithromycin (ZITHROMAX) 500 mg in sodium chloride 0.9 % 250 mL IVPB (0 mg Intravenous Stopped 08/09/20 1431)  iohexol (OMNIPAQUE) 350 MG/ML injection 75 mL (75 mLs Intravenous Contrast Given 08/09/20 1304)     ED Discharge Orders    None       Note:  This document was prepared using Dragon voice recognition software and may include  unintentional dictation errors.   Vanessa [place], MD 08/09/20 1745

## 2020-08-09 NOTE — Sepsis Progress Note (Signed)
elink monitoring code sepsis.  

## 2020-08-09 NOTE — ED Notes (Signed)
Pt returned from CT °

## 2020-08-09 NOTE — H&P (Signed)
History and Physical  Benjamin Torres QIH:[id number] DOB: [birth year] DOA: 08/09/2020  Referring physician: Dr. Jari Pigg, [place]. PCP: Rusty Aus, MD  Outpatient Specialists: Oncology, cardiology. Patient coming from: Home via EMS  Chief Complaint: Shortness of breath.  HPI: KHRISTOPHER Torres is a 65 y.o. male with medical history significant for former tobacco use, COPD not on oxygen supplementation, prior throat cancer in 2015, essential hypertension, hyperlipidemia, hypothyroidism who presented to [facility] ED from home via EMS with complaints of worsening shortness of breath.  Onset 2 days prior to presentation while at rest.  Initially resolved with his home inhaler.  This morning he woke up around 4 AM gasping for air stated he could not breathe.  He used his inhaler with no improvement of his symptoms.  His wife called EMS.  He reports intermittent semiproductive cough.  No chest pain.  CTA PE negative for PE however showed left lower lobe infiltrates suggestive of early pneumonia.  Also noted to be in A. fib with RVR initially.  Was given IV fluid and converted spontaneously to sinus rhythm.  Started on Rocephin and IV azithromycin in the ED.  TRH asked to admit.  ED Course: T-max 100.1, heart rate 139, respiratory rate 24.  CTA PE negative for PE positive for left lower lobe infiltrates suggestive of pneumonia.  Code sepsis called in the ED.  Patient was initially in A. fib with RVR, spontaneously converted to sinus rhythm.  WBC 18,000, lactic acid 1.9, procalcitonin less than 0.10, glucose 155.  BNP 37.  Review of Systems: Review of systems as noted in the HPI. All other systems reviewed and are negative.   Past Medical History:  Diagnosis Date  . 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 COPD (chronic obstructive pulmonary disease) ([place])   . Hypothyroidism   . PE (pulmonary thromboembolism) ([place])    right lung, over 10 yrs ago   Past Surgical History:  Procedure Laterality Date  . ANAL  FISSURE REPAIR    . APPENDECTOMY    . BIOPSY PHARYNX  2015   Larynx BX  . COLONOSCOPY WITH PROPOFOL N/A 03/07/2017   Procedure: COLONOSCOPY WITH PROPOFOL;  Surgeon: Lucilla Lame, MD;  Location: [place];  Service: Endoscopy;  Laterality: N/A;  . ESOPHAGOGASTRODUODENOSCOPY (EGD) WITH PROPOFOL N/A 03/07/2017   Procedure: ESOPHAGOGASTRODUODENOSCOPY (EGD) WITH PROPOFOL;  Surgeon: Lucilla Lame, MD;  Location: [facility];  Service: Endoscopy;  Laterality: N/A;  . LEFT HEART CATH AND CORONARY ANGIOGRAPHY Left 09/23/2019   Procedure: LEFT HEART CATH AND CORONARY ANGIOGRAPHY;  Surgeon: Teodoro Spray, MD;  Location: [place] [facility];  Service: Cardiovascular;  Laterality: Left;  . POLYPECTOMY  03/07/2017   Procedure: POLYPECTOMY;  Surgeon: Lucilla Lame, MD;  Location: [facility];  Service: Endoscopy;;    Social History:  reports that he has been smoking e-cigarettes. He has a 60.00 pack-year smoking history. He has never used smokeless tobacco. He reports that he does not drink alcohol. No history on file for drug use.   No Known Allergies  Family history: Mother with history of heart disease.  Prior to Admission medications   Medication Sig Start Date End Date Taking? Authorizing Provider  acetaminophen (TYLENOL) 500 MG tablet Take 500-1,000 mg by mouth every 6 (six) hours as needed (for pain.).   Yes [provider]  albuterol (PROVENTIL HFA;VENTOLIN HFA) 108 (90 Base) MCG/ACT inhaler Inhale 1 puff into the lungs every 4 (four) hours as needed for wheezing or shortness of breath.  Yes [provider]  ibuprofen (ADVIL) 200 MG tablet Take 400 mg by mouth as needed for headache.   Yes [provider]  levothyroxine (SYNTHROID) 112 MCG tablet TAKE 1 TABLET (112 MCG TOTAL) BY MOUTH DAILY BEFORE BREAKFAST. 07/07/19  Yes Corcoran, Drue Second, MD  losartan (COZAAR) 100 MG tablet Take 1 tablet by mouth daily. 07/10/20  Yes [provider]   metoprolol succinate (TOPROL-XL) 25 MG 24 hr tablet Take 25 mg by mouth daily. 09/14/19  Yes [provider]  olmesartan (BENICAR) 20 MG tablet Take 20 mg by mouth daily. 08/17/19  Yes [provider]  rosuvastatin (CRESTOR) 10 MG tablet Take 10 mg by mouth at bedtime. 07/12/20  Yes [provider]  tamsulosin (FLOMAX) 0.4 MG CAPS capsule Take 1 capsule (0.4 mg total) by mouth daily. 09/22/18  Yes Billey Co, MD  TRELEGY ELLIPTA 200-62.5-25 MCG/INH AEPB Inhale 1 puff into the lungs daily at 2 PM.  08/18/19  Yes [provider]  vitamin B-12 (CYANOCOBALAMIN) 1000 MCG tablet Take 1,000 mcg by mouth 2 (two) times a week.   Yes [provider]    Physical Exam: BP 120/79   Pulse 78   Temp 98.8 F (37.1 C) (Oral)   Resp 15   Ht 5\' 7"  (1.702 m)   Wt 72.6 kg   SpO2 98%   BMI 25.06 kg/m   . General: 65 y.o. year-old male well developed well nourished in no acute distress.  Alert and oriented x3. . Cardiovascular: Regular rate and rhythm with no rubs or gallops.  No thyromegaly or JVD noted.  No lower extremity edema. 2/4 pulses in all 4 extremities. Marland Kitchen Respiratory: Mild rales at bases with no wheezing noted.  Good inspiratory effort. . Abdomen: Soft nontender nondistended with normal bowel sounds x4 quadrants. . Muskuloskeletal: No cyanosis, clubbing or edema noted bilaterally . Neuro: CN II-XII intact, strength, sensation, reflexes . Skin: No ulcerative lesions noted or rashes . Psychiatry: Judgement and insight appear normal. Mood is appropriate for condition and setting          Labs on Admission:  Basic Metabolic Panel: Recent Labs  Lab 08/09/20 1046  NA 136  K 4.2  CL 105  CO2 20*  GLUCOSE 155*  BUN 20  CREATININE 0.74  CALCIUM 9.2   Liver Function Tests: Recent Labs  Lab 08/09/20 1046  AST 23  ALT 20  ALKPHOS 55  BILITOT 1.1  PROT 7.6  ALBUMIN 4.2   No results for input(s): LIPASE, AMYLASE in the last 168 hours. No  results for input(s): AMMONIA in the last 168 hours. CBC: Recent Labs  Lab 08/09/20 1046  WBC 18.4*  NEUTROABS 16.3*  HGB 16.0  HCT 47.2  MCV 86.8  PLT 215   Cardiac Enzymes: No results for input(s): CKTOTAL, CKMB, CKMBINDEX, TROPONINI in the last 168 hours.  BNP (last 3 results) Recent Labs    08/09/20 1046  BNP 37.4    ProBNP (last 3 results) No results for input(s): PROBNP in the last 8760 hours.  CBG: No results for input(s): GLUCAP in the last 168 hours.  Radiological Exams on Admission: CT Angio Chest PE W and/or Wo Contrast  Result Date: 08/09/2020 CLINICAL DATA:  Chest pain and shortness of breath for 2 days EXAM: CT ANGIOGRAPHY CHEST WITH CONTRAST TECHNIQUE: Multidetector CT imaging of the chest was performed using the standard protocol during bolus administration of intravenous contrast. Multiplanar CT image reconstructions and MIPs were obtained to  evaluate the vascular anatomy. CONTRAST:  72mL OMNIPAQUE IOHEXOL 350 MG/ML SOLN COMPARISON:  Chest x-ray from earlier in the same day. FINDINGS: Cardiovascular: Thoracic aorta and its branches are well visualized. No aneurysmal dilatation or dissection is seen. No cardiac enlargement is noted. Coronary calcifications are seen. The pulmonary artery shows a normal branching pattern bilaterally. No filling defect to suggest pulmonary embolism is noted. Mediastinum/Nodes: Thoracic inlet is within normal limits. No sizable hilar or mediastinal adenopathy is noted. The esophagus as visualized is within normal limits. Small sliding-type hiatal hernia is noted. Lungs/Pleura: Lungs show diffuse emphysematous changes. No focal confluent infiltrate or sizable effusion is noted. Mild airspace opacity in the left lower lobe laterally is noted similar to that seen on recent chest x-ray suspicious for early infiltrate. No sizable parenchymal nodules are seen. Upper Abdomen: Visualized upper abdomen demonstrates evidence of cholelithiasis without  complicating factors. Musculoskeletal: Degenerative changes of the thoracic spine are noted. No acute rib abnormality is seen. Review of the MIP images confirms the above findings. IMPRESSION: No evidence of pulmonary embolism. Early infiltrate in the lateral aspect of the left lower lobe. Electronically Signed   By: Inez Catalina M.D.   On: 08/09/2020 13:44   DG Chest Portable 1 View  Result Date: 08/09/2020 CLINICAL DATA:  Shortness of breath EXAM: PORTABLE CHEST 1 VIEW COMPARISON:  05/21/2014 FINDINGS: Airspace opacity noted at the left lung base concerning for pneumonia. Right lung clear. Heart is normal size. No effusions or acute bony abnormality. IMPRESSION: Left basilar airspace opacity concerning for pneumonia. Electronically Signed   By: Rolm Baptise M.D.   On: 08/09/2020 11:50    EKG: I independently viewed the EKG done and my findings are as followed: A. fib with RVR rate of 150.  Nonspecific ST-T changes.  Assessment/Plan Present on Admission: . CAP (community acquired pneumonia)  Active Problems:   CAP (community acquired pneumonia)  Sepsis secondary to community-acquired pneumonia, POA. Presented with worsening dyspnea at rest. Leukocytosis on presentation with WBC 18,000, temperature 100.1, heart rate in the 150s, evidence of left lower lobe pneumonia on CT chest. Started on Rocephin and azithromycin in the ED, continue. Start DuoNebs every 6 hours and every 2 hours as needed for wheezing and shortness of breath. Start gentle IV fluid hydration normal saline at 50 cc/h x 1 day. Initial procalcitonin negative. Monitor fever curve WBC.  Resolved A. fib with RVR  On presentation was in A. fib with RVR with a rate in the 150s. Converted spontaneously to sinus rhythm. Heart rate is currently in the 80s. Monitor on telemetry for recurrence.  Hypothyroidism Obtain TSH Resume home levothyroxine.  COPD, not on oxygen supplementation at baseline. Resume home  bronchodilators. Maintain O2 saturation greater than 90%.  Essential hypertension BP is currently at goal. Continue home regimen.  Hyperlipidemia/BPH Resume home regimen      DVT prophylaxis: Subcu Lovenox daily.  Code Status: Full code as stable patient himself.  Family Communication: None at bedside.  Disposition Plan: Admit to MedSurg unit with remote telemetry.  Consults called: None.  Admission status: Observation status.   Status is: Observation    Dispo: The patient is from: Home.              Anticipated d/c is to: Home.  Possibly on 08/10/2020 once SIRS criteria has improved.              Patient currently not stable for discharge due to newly diagnosed left lower lobe pneumonia on IV antibiotics  and IV fluid.   Difficult to place patient, not applicable.       Kayleen Memos MD [facility] Pager [phone]  If 7PM-7AM, please contact night-coverage [email or web address] Password [facility]  08/09/2020, 2:31 PM

## 2020-08-09 NOTE — ED Triage Notes (Signed)
Pt to ED [facility] from home for shob intermittent x2 days.  Hx a fib, emphysema  No o2 use at home. 4L [place] on arrival 98% Alert and oriented.  Denies cp  Labored breathing at rest, speaking in short sentences.   Pt unable to reach sig pat, verbalizes understanding of MSE waiver

## 2020-08-10 DIAGNOSIS — Z8521 Personal history of malignant neoplasm of larynx: Secondary | ICD-10-CM | POA: Diagnosis not present

## 2020-08-10 DIAGNOSIS — J189 Pneumonia, unspecified organism: Secondary | ICD-10-CM | POA: Diagnosis present

## 2020-08-10 DIAGNOSIS — R0602 Shortness of breath: Secondary | ICD-10-CM | POA: Diagnosis present

## 2020-08-10 DIAGNOSIS — I2582 Chronic total occlusion of coronary artery: Secondary | ICD-10-CM | POA: Diagnosis present

## 2020-08-10 DIAGNOSIS — Z79899 Other long term (current) drug therapy: Secondary | ICD-10-CM | POA: Diagnosis not present

## 2020-08-10 DIAGNOSIS — J440 Chronic obstructive pulmonary disease with acute lower respiratory infection: Secondary | ICD-10-CM | POA: Diagnosis present

## 2020-08-10 DIAGNOSIS — Z791 Long term (current) use of non-steroidal anti-inflammatories (NSAID): Secondary | ICD-10-CM | POA: Diagnosis not present

## 2020-08-10 DIAGNOSIS — I4891 Unspecified atrial fibrillation: Secondary | ICD-10-CM | POA: Diagnosis present

## 2020-08-10 DIAGNOSIS — Z86711 Personal history of pulmonary embolism: Secondary | ICD-10-CM | POA: Diagnosis not present

## 2020-08-10 DIAGNOSIS — N400 Enlarged prostate without lower urinary tract symptoms: Secondary | ICD-10-CM | POA: Diagnosis present

## 2020-08-10 DIAGNOSIS — Z8249 Family history of ischemic heart disease and other diseases of the circulatory system: Secondary | ICD-10-CM | POA: Diagnosis not present

## 2020-08-10 DIAGNOSIS — I420 Dilated cardiomyopathy: Secondary | ICD-10-CM | POA: Diagnosis present

## 2020-08-10 DIAGNOSIS — E039 Hypothyroidism, unspecified: Secondary | ICD-10-CM | POA: Diagnosis present

## 2020-08-10 DIAGNOSIS — J441 Chronic obstructive pulmonary disease with (acute) exacerbation: Secondary | ICD-10-CM

## 2020-08-10 DIAGNOSIS — F17290 Nicotine dependence, other tobacco product, uncomplicated: Secondary | ICD-10-CM | POA: Diagnosis present

## 2020-08-10 DIAGNOSIS — J9601 Acute respiratory failure with hypoxia: Secondary | ICD-10-CM | POA: Diagnosis present

## 2020-08-10 DIAGNOSIS — Z20822 Contact with and (suspected) exposure to covid-19: Secondary | ICD-10-CM | POA: Diagnosis present

## 2020-08-10 DIAGNOSIS — I2510 Atherosclerotic heart disease of native coronary artery without angina pectoris: Secondary | ICD-10-CM | POA: Diagnosis present

## 2020-08-10 DIAGNOSIS — Z79890 Hormone replacement therapy (postmenopausal): Secondary | ICD-10-CM | POA: Diagnosis not present

## 2020-08-10 DIAGNOSIS — I10 Essential (primary) hypertension: Secondary | ICD-10-CM | POA: Diagnosis present

## 2020-08-10 DIAGNOSIS — A419 Sepsis, unspecified organism: Secondary | ICD-10-CM | POA: Diagnosis present

## 2020-08-10 DIAGNOSIS — E785 Hyperlipidemia, unspecified: Secondary | ICD-10-CM | POA: Diagnosis present

## 2020-08-10 LAB — COMPREHENSIVE METABOLIC PANEL
ALT: 16 U/L (ref 0–44)
AST: 18 U/L (ref 15–41)
Albumin: 3.3 g/dL — ABNORMAL LOW (ref 3.5–5.0)
Alkaline Phosphatase: 41 U/L (ref 38–126)
Anion gap: 8 (ref 5–15)
BUN: 14 mg/dL (ref 8–23)
CO2: 25 mmol/L (ref 22–32)
Calcium: 8.6 mg/dL — ABNORMAL LOW (ref 8.9–10.3)
Chloride: 107 mmol/L (ref 98–111)
Creatinine, Ser: 0.87 mg/dL (ref 0.61–1.24)
GFR, Estimated: >60 mL/min (ref >60)
Glucose, Bld: 91 mg/dL (ref 70–99)
Potassium: 4.4 mmol/L (ref 3.5–5.1)
Sodium: 140 mmol/L (ref 135–145)
Total Bilirubin: 0.9 mg/dL (ref 0.3–1.2)
Total Protein: 6.1 g/dL — ABNORMAL LOW (ref 6.5–8.1)

## 2020-08-10 LAB — CBC WITH DIFFERENTIAL/PLATELET
Abs Immature Granulocytes: 0.1 10*3/uL — ABNORMAL HIGH (ref 0.00–0.07)
Basophils Absolute: 0 10*3/uL (ref 0.0–0.1)
Basophils Relative: 0 %
Eosinophils Absolute: 0.1 10*3/uL (ref 0.0–0.5)
Eosinophils Relative: 1 %
HCT: 39.2 % (ref 39.0–52.0)
Hemoglobin: 13.3 g/dL (ref 13.0–17.0)
Immature Granulocytes: 1 %
Lymphocytes Relative: 10 %
Lymphs Abs: 1.5 10*3/uL (ref 0.7–4.0)
MCH: 30 pg (ref 26.0–34.0)
MCHC: 33.9 g/dL (ref 30.0–36.0)
MCV: 88.3 fL (ref 80.0–100.0)
Monocytes Absolute: 1.1 10*3/uL — ABNORMAL HIGH (ref 0.1–1.0)
Monocytes Relative: 8 %
Neutro Abs: 11.8 10*3/uL — ABNORMAL HIGH (ref 1.7–7.7)
Neutrophils Relative %: 80 %
Platelets: 196 10*3/uL (ref 150–400)
RBC: 4.44 MIL/uL (ref 4.22–5.81)
RDW: 12.6 % (ref 11.5–15.5)
WBC: 14.6 10*3/uL — ABNORMAL HIGH (ref 4.0–10.5)
nRBC: 0 % (ref 0.0–0.2)

## 2020-08-10 LAB — TSH: TSH: 0.075 u[IU]/mL — ABNORMAL LOW (ref 0.350–4.500)

## 2020-08-10 LAB — T4, FREE: Free T4: 1.24 ng/dL — ABNORMAL HIGH (ref 0.61–1.12)

## 2020-08-10 LAB — PROCALCITONIN: Procalcitonin: 0.46 ng/mL

## 2020-08-10 LAB — HEMOGLOBIN A1C
Hgb A1c MFr Bld: 5.9 % — ABNORMAL HIGH (ref 4.8–5.6)
Mean Plasma Glucose: 122.63 mg/dL

## 2020-08-10 LAB — PHOSPHORUS: Phosphorus: 2.2 mg/dL — ABNORMAL LOW (ref 2.5–4.6)

## 2020-08-10 LAB — MAGNESIUM: Magnesium: 1.7 mg/dL (ref 1.7–2.4)

## 2020-08-10 MED ORDER — ACETAMINOPHEN 325 MG PO TABS
650.0000 mg | ORAL_TABLET | ORAL | Status: DC | PRN
  Administered 2020-08-10: 650 mg via ORAL
  Filled 2020-08-10: qty 2

## 2020-08-10 MED ORDER — MAGNESIUM SULFATE 2 GM/50ML IV SOLN
2.0000 g | Freq: Once | INTRAVENOUS | Status: AC
  Administered 2020-08-10: 2 g via INTRAVENOUS
  Filled 2020-08-10: qty 50

## 2020-08-10 MED ORDER — IPRATROPIUM-ALBUTEROL 0.5-2.5 (3) MG/3ML IN SOLN
3.0000 mL | RESPIRATORY_TRACT | Status: DC
  Administered 2020-08-10 – 2020-08-11 (×4): 3 mL via RESPIRATORY_TRACT
  Filled 2020-08-10 (×3): qty 3

## 2020-08-10 MED ORDER — K PHOS MONO-SOD PHOS DI & MONO 155-852-130 MG PO TABS
500.0000 mg | ORAL_TABLET | Freq: Once | ORAL | Status: AC
  Administered 2020-08-10: 16:00:00 500 mg via ORAL
  Filled 2020-08-10: qty 2

## 2020-08-10 NOTE — Progress Notes (Signed)
PROGRESS NOTE    SHEMAR PLEMMONS  BJS:[id number] DOB: 04/12/1956 DOA: 08/09/2020 PCP: Rusty Aus, MD  115A/115A-AA   Assessment & Plan:   Active Problems:   CAP (community acquired pneumonia)   Pneumonia   BENSYN BORNEMANN is a 65 y.o. male with medical history significant for former tobacco use, COPD not on oxygen supplementation, prior throat cancer in 2015, essential hypertension, hyperlipidemia, hypothyroidism who presented to [facility] ED from home via EMS with complaints of worsening shortness of breath.   Sepsis secondary to community-acquired pneumonia, POA. Presented with worsening dyspnea at rest. Leukocytosis on presentation with WBC 18,000, heart rate in the 150s, evidence of left lower lobe pneumonia on CT chest. Started on Rocephin and azithromycin in the ED and IVF Plan: --cont ceftriaxone and azithromycin --d/c MIVF  Resolved A. fib with RVR  On presentation was in A. fib with RVR with a rate in the 150s. Converted spontaneously to sinus rhythm.  Likely due to acute illness. --No need for further management unless recurring  Hypothyroidism TSH low and free T4 high --reduce dose of home Synthroid from 112 down to 75  COPD exacerbation Current vape use Prior heavy smoker --cont home bronchodilators --DuoNeb QID while awake  Essential hypertension --cont home Toprol  Hyperlipidemia --cont home statin  BPH --cont home flomax   DVT prophylaxis: Lovenox SQ Code Status: Full code  Family Communication: wife and mother updated at bedside today Level of care: Med-Surg Dispo:   The patient is from: home Anticipated d/c is to: home Anticipated d/c date is: 1-2 days Patient currently is not medically ready to d/c due to: PNA on IV abx   Subjective and Interval History:  Pt reported dyspnea improved.  Had cough productive of sputum though that's chronic.     Objective: Vitals:   08/10/20 1816 08/10/20 1949 08/10/20 2232 08/10/20 2333  BP:  (!) 130/95 (!) 148/87  120/86  Pulse: 78 71 64 70  Resp:  16 16 17   Temp:  98.2 F (36.8 C)  98 F (36.7 C)  TempSrc:  Oral  Oral  SpO2:  97% 97% 97%  Weight:      Height:        Intake/Output Summary (Last 24 hours) at 08/11/2020 0232 Last data filed at 08/10/2020 1920 Gross per 24 hour  Intake 1036.67 ml  Output 100 ml  Net 936.67 ml   Filed Weights   08/09/20 1038  Weight: 72.6 kg    Examination:   Constitutional: NAD, AAOx3 HEENT: conjunctivae and lids normal, EOMI CV: No cyanosis.   RESP: reduced lung sounds, some rhonchi, on RA Extremities: No effusions, edema in BLE SKIN: warm, dry Neuro: II - XII grossly intact.   Psych: Normal mood and affect.  Appropriate judgement and reason   Data Reviewed: I have personally reviewed following labs and imaging studies  CBC: Recent Labs  Lab 08/09/20 1046 08/10/20 0417  WBC 18.4* 14.6*  NEUTROABS 16.3* 11.8*  HGB 16.0 13.3  HCT 47.2 39.2  MCV 86.8 88.3  PLT 215 607   Basic Metabolic Panel: Recent Labs  Lab 08/09/20 1046 08/10/20 0417  NA 136 140  K 4.2 4.4  CL 105 107  CO2 20* 25  GLUCOSE 155* 91  BUN 20 14  CREATININE 0.74 0.87  CALCIUM 9.2 8.6*  MG  --  1.7  PHOS  --  2.2*   GFR: Estimated Creatinine Clearance: 78.1 mL/min (by C-G formula based on SCr of 0.87 mg/dL). Liver Function  Tests: Recent Labs  Lab 08/09/20 1046 08/10/20 0417  AST 23 18  ALT 20 16  ALKPHOS 55 41  BILITOT 1.1 0.9  PROT 7.6 6.1*  ALBUMIN 4.2 3.3*   No results for input(s): LIPASE, AMYLASE in the last 168 hours. No results for input(s): AMMONIA in the last 168 hours. Coagulation Profile: Recent Labs  Lab 08/09/20 1046  INR 1.1   Cardiac Enzymes: No results for input(s): CKTOTAL, CKMB, CKMBINDEX, TROPONINI in the last 168 hours. BNP (last 3 results) No results for input(s): PROBNP in the last 8760 hours. HbA1C: Recent Labs    08/10/20 0417  HGBA1C 5.9*   CBG: No results for input(s): GLUCAP in the last  168 hours. Lipid Profile: No results for input(s): CHOL, HDL, LDLCALC, TRIG, CHOLHDL, LDLDIRECT in the last 72 hours. Thyroid Function Tests: Recent Labs    08/10/20 0417  TSH 0.075*  FREET4 1.24*   Anemia Panel: No results for input(s): VITAMINB12, FOLATE, FERRITIN, TIBC, IRON, RETICCTPCT in the last 72 hours. Sepsis Labs: Recent Labs  Lab 08/09/20 1046 08/09/20 1342 08/10/20 0417  PROCALCITON <0.10  --  0.46  LATICACIDVEN 1.9 1.1  --     Recent Results (from the past 240 hour(s))  Culture, blood (Routine x 2)     Status: None (Preliminary result)   Collection Time: 08/09/20 10:41 AM   Specimen: BLOOD  Result Value Ref Range Status   Specimen Description BLOOD RT [facility]  Final   Special Requests   Final    BOTTLES DRAWN AEROBIC AND ANAEROBIC Blood Culture adequate volume   Culture   Final    NO GROWTH < 24 HOURS Performed at [facility], [address] [place], [place] [address]    Report Status PENDING  Incomplete  Resp Panel by RT-PCR (Flu A&B, Covid) Nasopharyngeal Swab     Status: None   Collection Time: 08/09/20 10:41 AM   Specimen: Nasopharyngeal Swab; Nasopharyngeal(NP) swabs in vial transport medium  Result Value Ref Range Status   SARS Coronavirus 2 by RT PCR NEGATIVE NEGATIVE Final    Comment: (NOTE) SARS-CoV-2 target nucleic acids are NOT DETECTED.  The SARS-CoV-2 RNA is generally detectable in upper respiratory specimens during the acute phase of infection. The lowest concentration of SARS-CoV-2 viral copies this assay can detect is 138 copies/mL. A negative result does not preclude SARS-Cov-2 infection and should not be used as the sole basis for treatment or other patient management decisions. A negative result may occur with  improper specimen collection/handling, submission of specimen other than nasopharyngeal swab, presence of viral mutation(s) within the areas targeted by this assay, and inadequate number of viral copies(<138 copies/mL).  A negative result must be combined with clinical observations, patient history, and epidemiological information. The expected result is Negative.  Fact Sheet for Patients:  EntrepreneurPulse.com.au  Fact Sheet for Healthcare Providers:  IncredibleEmployment.be  This test is no t yet approved or cleared by the [place] FDA and  has been authorized for detection and/or diagnosis of SARS-CoV-2 by FDA under an Emergency Use Authorization (EUA). This EUA will remain  in effect (meaning this test can be used) for the duration of the COVID-19 declaration under Section 564(b)(1) of the Act, 21 U.S.C.section 360bbb-3(b)(1), unless the authorization is terminated  or revoked sooner.       Influenza A by PCR NEGATIVE NEGATIVE Final   Influenza B by PCR NEGATIVE NEGATIVE Final    Comment: (NOTE) The Xpert Xpress SARS-CoV-2/FLU/RSV plus assay is intended as  an aid in the diagnosis of influenza from Nasopharyngeal swab specimens and should not be used as a sole basis for treatment. Nasal washings and aspirates are unacceptable for Xpert Xpress SARS-CoV-2/FLU/RSV testing.  Fact Sheet for Patients: EntrepreneurPulse.com.au  Fact Sheet for Healthcare Providers: IncredibleEmployment.be  This test is not yet approved or cleared by the [place] FDA and has been authorized for detection and/or diagnosis of SARS-CoV-2 by FDA under an Emergency Use Authorization (EUA). This EUA will remain in effect (meaning this test can be used) for the duration of the COVID-19 declaration under Section 564(b)(1) of the Act, 21 U.S.C. section 360bbb-3(b)(1), unless the authorization is terminated or revoked.  Performed at [facility], [address] [place], [place] [address]   Culture, blood (Routine x 2)     Status: None (Preliminary result)   Collection Time: 08/09/20 10:46 AM   Specimen: BLOOD  Result Value Ref  Range Status   Specimen Description BLOOD RT [facility]  Final   Special Requests   Final    BOTTLES DRAWN AEROBIC AND ANAEROBIC Blood Culture adequate volume   Culture   Final    NO GROWTH < 24 HOURS Performed at [facility], [address], [place], [place] [address]    Report Status PENDING  Incomplete      Radiology Studies: CT Angio Chest PE W and/or Wo Contrast  Result Date: 08/09/2020 CLINICAL DATA:  Chest pain and shortness of breath for 2 days EXAM: CT ANGIOGRAPHY CHEST WITH CONTRAST TECHNIQUE: Multidetector CT imaging of the chest was performed using the standard protocol during bolus administration of intravenous contrast. Multiplanar CT image reconstructions and MIPs were obtained to evaluate the vascular anatomy. CONTRAST:  74mL OMNIPAQUE IOHEXOL 350 MG/ML SOLN COMPARISON:  Chest x-ray from earlier in the same day. FINDINGS: Cardiovascular: Thoracic aorta and its branches are well visualized. No aneurysmal dilatation or dissection is seen. No cardiac enlargement is noted. Coronary calcifications are seen. The pulmonary artery shows a normal branching pattern bilaterally. No filling defect to suggest pulmonary embolism is noted. Mediastinum/Nodes: Thoracic inlet is within normal limits. No sizable hilar or mediastinal adenopathy is noted. The esophagus as visualized is within normal limits. Small sliding-type hiatal hernia is noted. Lungs/Pleura: Lungs show diffuse emphysematous changes. No focal confluent infiltrate or sizable effusion is noted. Mild airspace opacity in the left lower lobe laterally is noted similar to that seen on recent chest x-ray suspicious for early infiltrate. No sizable parenchymal nodules are seen. Upper Abdomen: Visualized upper abdomen demonstrates evidence of cholelithiasis without complicating factors. Musculoskeletal: Degenerative changes of the thoracic spine are noted. No acute rib abnormality is seen. Review of the MIP images confirms the above  findings. IMPRESSION: No evidence of pulmonary embolism. Early infiltrate in the lateral aspect of the left lower lobe. Electronically Signed   By: Inez Catalina M.D.   On: 08/09/2020 13:44   DG Chest Portable 1 View  Result Date: 08/09/2020 CLINICAL DATA:  Shortness of breath EXAM: PORTABLE CHEST 1 VIEW COMPARISON:  05/21/2014 FINDINGS: Airspace opacity noted at the left lung base concerning for pneumonia. Right lung clear. Heart is normal size. No effusions or acute bony abnormality. IMPRESSION: Left basilar airspace opacity concerning for pneumonia. Electronically Signed   By: Rolm Baptise M.D.   On: 08/09/2020 11:50     Scheduled Meds: . enoxaparin (LOVENOX) injection  40 mg Subcutaneous Q24H  . fluticasone furoate-vilanterol  1 puff Inhalation Daily   And  . umeclidinium bromide  1 puff  Inhalation Daily  . ipratropium-albuterol  3 mL Nebulization Q4H while awake  . levothyroxine  112 mcg Oral Q0600  . metoprolol succinate  25 mg Oral Daily  . rosuvastatin  10 mg Oral QHS  . tamsulosin  0.4 mg Oral Daily  . vitamin B-12  1,000 mcg Oral Once per day on Mon Thu   Continuous Infusions: . azithromycin 500 mg (08/10/20 1230)  . cefTRIAXone (ROCEPHIN)  IV 2 g (08/10/20 1100)     LOS: 1 day     Enzo Bi, MD [facility] If 7PM-7AM, please contact night-coverage 08/11/2020, 2:32 AM

## 2020-08-10 NOTE — Progress Notes (Incomplete)
PROGRESS NOTE    Benjamin Torres  IWU:[id number] DOB: [birth year] DOA: 08/09/2020 PCP: Rusty Aus, MD  115A/115A-AA   Assessment & Plan:   Active Problems:   CAP (community acquired pneumonia)   COPD   Atrial fibrillation   Hypothyroidism   Hypertension   Hyperlipidemia   Benign prostatic hypertrophy   Benjamin Torres is a 65 y.o. male with medical history significant for COPD without home oxygen use, hypertension, hyperlipidemia, hypothyroidism, hx of throat cancer, and former smoker who presented with worsening shortness of breath for the past 2 days. The patient states he had been using his rescue inhaler, which had resolved his symptoms, until he was awakened from sleep at 4 AM two days ago gasping for air. At that time, his inhaler did not provide significant relief, and his wife called EMS. The patient was found to have left lower lobe CAP and met sepsis criteria. He was started on Rocephin and azithromycin and admitted.   #1 Left lower lobe pneumonia  --CXR and CT Angio Chest show L basilar airspace opacity and early infiltrate --Leukocytosis (WBC 18,000) --Tachypnea (RR 30) --Lactic acid and procalcitonin negative --Started on Rocephin and azithromycin with improvement to WBC count --IV normal saline PLAN:  --Continue Rocephin and azithromycin and transition to oral levofloxacin as patient improves --Monitor temperature and WBC for continued improvement --Discontinue IVF to avoid fluid overload  #2 Suspected COPD exacerbation --Increased work of breathing --Given 4L oxygen by nasal cannula, weaned to room air --DuoNeb given every 6 hours and every 2 hours as needed for wheezing and shortness of breath PLAN: --Continue DuoNeb as needed --Discontinue IVF to avoid fluid overload  Atrial fibrillation --Resolved to NSR --No prior episodes --Put on telemetry for monitoring PLAN: --Discontinue telemetry since resolved  Hypothyroidism --TSH 0.075, continue  Synthroid with recommendation for followup with PCP for adjustment  Hypertension, hyperlipidemia, BPH --Continue home regimen of metoprolol succinate, olmesartan, and tamsulosin    DVT prophylaxis: Lovenox SQ Code Status: Full code  Family Communication: Wife and sister at bedside Level of care: Med-Surg Dispo:   The patient is from: Home Anticipated d/c is to: Home Anticipated d/c date is: 04/15/ 2022 Patient currently is not medically ready to d/c due to: Continued IV antibiotics.   Subjective and Interval History:  Patient is already feeling better with improved breathing. Weaned off nasal cannula. Improvement in appetite.   Objective: Vitals:   08/10/20 0356 08/10/20 0828 08/10/20 1023 08/10/20 1249  BP: 121/78 124/83  130/86  Pulse: 65 71  74  Resp: _0 Temp: 98.7 F (37.1 C) 98.4 F (36.9 C)  98.3 F (36.8 C)  TempSrc: Oral Oral    SpO2: 100% 98% 97% 99%  Weight:      Height:        Intake/Output Summary (Last 24 hours) at 08/10/2020 1359 Last data filed at 08/10/2020 1054 Gross per 24 hour  Intake 1046.67 ml  Output 550 ml  Net 496.67 ml   Filed Weights   08/09/20 1038  Weight: 72.6 kg    Examination:   Constitutional: NAD, AAOx3 HEENT: conjunctivae and lids normal, EOMI CV: No cyanosis.   RESP: normal respiratory effort, on RA. Decreased breath sounds. Extremities: No effusions, edema in BLE SKIN: warm, dry Neuro: II - XII grossly intact.   Psych: Normal mood and affect.  Appropriate judgement and reason   Data Reviewed: I have personally reviewed following labs and imaging studies  CBC: Recent Labs  Lab 08/09/20 1046 08/10/20 0417  WBC 18.4* 14.6*  NEUTROABS 16.3* 11.8*  HGB 16.0 13.3  HCT 47.2 39.2  MCV 86.8 88.3  PLT 215 672   Basic Metabolic Panel: Recent Labs  Lab 08/09/20 1046 08/10/20 0417  NA 136 140  K 4.2 4.4  CL 105 107  CO2 20* 25  GLUCOSE 155* 91  BUN 20 14  CREATININE 0.74 0.87  CALCIUM 9.2 8.6*  MG   --  1.7  PHOS  --  2.2*   GFR: Estimated Creatinine Clearance: 78.1 mL/min (by C-G formula based on SCr of 0.87 mg/dL). Liver Function Tests: Recent Labs  Lab 08/09/20 1046 08/10/20 0417  AST 23 18  ALT 20 16  ALKPHOS 55 41  BILITOT 1.1 0.9  PROT 7.6 6.1*  ALBUMIN 4.2 3.3*   No results for input(s): LIPASE, AMYLASE in the last 168 hours. No results for input(s): AMMONIA in the last 168 hours. Coagulation Profile: Recent Labs  Lab 08/09/20 1046  INR 1.1   Cardiac Enzymes: No results for input(s): CKTOTAL, CKMB, CKMBINDEX, TROPONINI in the last 168 hours. BNP (last 3 results) No results for input(s): PROBNP in the last 8760 hours. HbA1C: Recent Labs    08/10/20 0417  HGBA1C 5.9*   CBG: No results for input(s): GLUCAP in the last 168 hours. Lipid Profile: No results for input(s): CHOL, HDL, LDLCALC, TRIG, CHOLHDL, LDLDIRECT in the last 72 hours. Thyroid Function Tests: Recent Labs    08/10/20 0417  TSH 0.075*   Anemia Panel: No results for input(s): VITAMINB12, FOLATE, FERRITIN, TIBC, IRON, RETICCTPCT in the last 72 hours. Sepsis Labs: Recent Labs  Lab 08/09/20 1046 08/09/20 1342 08/10/20 0417  PROCALCITON <0.10  --  0.46  LATICACIDVEN 1.9 1.1  --     Recent Results (from the past 240 hour(s))  Culture, blood (Routine x 2)     Status: None (Preliminary result)   Collection Time: 08/09/20 10:41 AM   Specimen: BLOOD  Result Value Ref Range Status   Specimen Description BLOOD RT [facility]  Final   Special Requests   Final    BOTTLES DRAWN AEROBIC AND ANAEROBIC Blood Culture adequate volume   Culture   Final    NO GROWTH < 24 HOURS Performed at [facility], [address], [place], [place] [address]    Report Status PENDING  Incomplete  Resp Panel by RT-PCR (Flu A&B, Covid) Nasopharyngeal Swab     Status: None   Collection Time: 08/09/20 10:41 AM   Specimen: Nasopharyngeal Swab; Nasopharyngeal(NP) swabs in vial transport medium  Result Value Ref  Range Status   SARS Coronavirus 2 by RT PCR NEGATIVE NEGATIVE Final    Comment: (NOTE) SARS-CoV-2 target nucleic acids are NOT DETECTED.  The SARS-CoV-2 RNA is generally detectable in upper respiratory specimens during the acute phase of infection. The lowest concentration of SARS-CoV-2 viral copies this assay can detect is 138 copies/mL. A negative result does not preclude SARS-Cov-2 infection and should not be used as the sole basis for treatment or other patient management decisions. A negative result may occur with  improper specimen collection/handling, submission of specimen other than nasopharyngeal swab, presence of viral mutation(s) within the areas targeted by this assay, and inadequate number of viral copies(<138 copies/mL). A negative result must be combined with clinical observations, patient history, and epidemiological information. The expected result is Negative.  Fact Sheet for Patients:  EntrepreneurPulse.com.au  Fact Sheet for Healthcare Providers:  IncredibleEmployment.be  This test is no t  yet approved or cleared by the [place] and  has been authorized for detection and/or diagnosis of SARS-CoV-2 by FDA under an Emergency Use Authorization (EUA). This EUA will remain  in effect (meaning this test can be used) for the duration of the COVID-19 declaration under Section 564(b)(1) of the Act, 21 U.S.C.section 360bbb-3(b)(1), unless the authorization is terminated  or revoked sooner.       Influenza A by PCR NEGATIVE NEGATIVE Final   Influenza B by PCR NEGATIVE NEGATIVE Final    Comment: (NOTE) The Xpert Xpress SARS-CoV-2/FLU/RSV plus assay is intended as an aid in the diagnosis of influenza from Nasopharyngeal swab specimens and should not be used as a sole basis for treatment. Nasal washings and aspirates are unacceptable for Xpert Xpress SARS-CoV-2/FLU/RSV testing.  Fact Sheet for  Patients: EntrepreneurPulse.com.au  Fact Sheet for Healthcare Providers: IncredibleEmployment.be  This test is not yet approved or cleared by the [place] FDA and has been authorized for detection and/or diagnosis of SARS-CoV-2 by FDA under an Emergency Use Authorization (EUA). This EUA will remain in effect (meaning this test can be used) for the duration of the COVID-19 declaration under Section 564(b)(1) of the Act, 21 U.S.C. section 360bbb-3(b)(1), unless the authorization is terminated or revoked.  Performed at [facility], [address] [place], [place] [address]   Culture, blood (Routine x 2)     Status: None (Preliminary result)   Collection Time: 08/09/20 10:46 AM   Specimen: BLOOD  Result Value Ref Range Status   Specimen Description BLOOD RT [facility]  Final   Special Requests   Final    BOTTLES DRAWN AEROBIC AND ANAEROBIC Blood Culture adequate volume   Culture   Final    NO GROWTH < 24 HOURS Performed at [facility], [address] [place], [place] [address]    Report Status PENDING  Incomplete      Radiology Studies: CT Angio Chest PE W and/or Wo Contrast  Result Date: 08/09/2020 CLINICAL DATA:  Chest pain and shortness of breath for 2 days EXAM: CT ANGIOGRAPHY CHEST WITH CONTRAST TECHNIQUE: Multidetector CT imaging of the chest was performed using the standard protocol during bolus administration of intravenous contrast. Multiplanar CT image reconstructions and MIPs were obtained to evaluate the vascular anatomy. CONTRAST:  56mL OMNIPAQUE IOHEXOL 350 MG/ML SOLN COMPARISON:  Chest x-ray from earlier in the same day. FINDINGS: Cardiovascular: Thoracic aorta and its branches are well visualized. No aneurysmal dilatation or dissection is seen. No cardiac enlargement is noted. Coronary calcifications are seen. The pulmonary artery shows a normal branching pattern bilaterally. No filling defect to suggest  pulmonary embolism is noted. Mediastinum/Nodes: Thoracic inlet is within normal limits. No sizable hilar or mediastinal adenopathy is noted. The esophagus as visualized is within normal limits. Small sliding-type hiatal hernia is noted. Lungs/Pleura: Lungs show diffuse emphysematous changes. No focal confluent infiltrate or sizable effusion is noted. Mild airspace opacity in the left lower lobe laterally is noted similar to that seen on recent chest x-ray suspicious for early infiltrate. No sizable parenchymal nodules are seen. Upper Abdomen: Visualized upper abdomen demonstrates evidence of cholelithiasis without complicating factors. Musculoskeletal: Degenerative changes of the thoracic spine are noted. No acute rib abnormality is seen. Review of the MIP images confirms the above findings. IMPRESSION: No evidence of pulmonary embolism. Early infiltrate in the lateral aspect of the left lower lobe. Electronically Signed   By: Inez Catalina M.D.   On: 08/09/2020 13:44   DG Chest  Portable 1 View  Result Date: 08/09/2020 CLINICAL DATA:  Shortness of breath EXAM: PORTABLE CHEST 1 VIEW COMPARISON:  05/21/2014 FINDINGS: Airspace opacity noted at the left lung base concerning for pneumonia. Right lung clear. Heart is normal size. No effusions or acute bony abnormality. IMPRESSION: Left basilar airspace opacity concerning for pneumonia. Electronically Signed   By: Rolm Baptise M.D.   On: 08/09/2020 11:50     Scheduled Meds: . enoxaparin (LOVENOX) injection  40 mg Subcutaneous Q24H  . fluticasone furoate-vilanterol  1 puff Inhalation Daily   And  . umeclidinium bromide  1 puff Inhalation Daily  . ipratropium-albuterol  3 mL Nebulization Q4H while awake  . levothyroxine  112 mcg Oral Q0600  . metoprolol succinate  25 mg Oral Daily  . rosuvastatin  10 mg Oral QHS  . tamsulosin  0.4 mg Oral Daily  . vitamin B-12  1,000 mcg Oral Once per day on Mon Thu   Continuous Infusions: . azithromycin 500 mg (08/10/20  1230)  . cefTRIAXone (ROCEPHIN)  IV 2 g (08/10/20 1100)     LOS: 0 days     Jimmye Norman, PA-S Preceptor Enzo Bi, MD [facility] 08/10/2020, 1:59 PM

## 2020-08-10 NOTE — Care Management Obs Status (Signed)
[facility] NOTIFICATION   Patient Details  Name: Benjamin Torres MRN: [id number] Date of Birth: 1955/07/25   Medicare Observation Status Notification Given:  Yes    Shelbie Hutching, RN 08/10/2020, 12:36 PM

## 2020-08-10 NOTE — Progress Notes (Signed)
SATURATION QUALIFICATIONS: (This note is used to comply with regulatory documentation for home oxygen)  Patient Saturations on Room Air at Rest = 97%  Patient Saturations on Room Air while Ambulating = *89%  After desaturating when walking, patient recovered to 94% in less than a minute while standing still.

## 2020-08-10 NOTE — TOC Initial Note (Signed)
Transition of Care [facility]) - Initial/Assessment Note    Patient Details  Name: Benjamin Torres MRN: [id number] Date of Birth: 05-10-1955  Transition of Care [facility]) CM/SW Contact:    Shelbie Hutching, RN Phone Number: 08/10/2020, 12:39 PM  Clinical Narrative:                 Patient placed under observation for pneumonia.  RNCM me with patient at the bedside to review MOON.  Patient has no questions at this time and just wants to feel better.  Patient is from home with his wife and independent.  No needs identified.    Expected Discharge Plan: Home/Self Care Barriers to Discharge: Continued Medical Work up   Patient Goals and CMS Choice Patient states their goals for this hospitalization and ongoing recovery are:: To feel better      Expected Discharge Plan and Services Expected Discharge Plan: Home/Self Care       Living arrangements for the past 2 months: Single Family Home                 DME Arranged: N/A DME Agency: NA       HH Arranged: NA          Prior Living Arrangements/Services Living arrangements for the past 2 months: Single Family Home Lives with:: Spouse Patient language and need for interpreter reviewed:: Yes Do you feel safe going back to the place where you live?: Yes      Need for Family Participation in Patient Care: Yes (Comment) (pneumonia) Care giver support system in place?: Yes (comment) (wife)   Criminal Activity/Legal Involvement Pertinent to Current Situation/Hospitalization: No - Comment as needed  Activities of Daily Living Home Assistive Devices/Equipment: None ADL Screening (condition at time of admission) Patient's cognitive ability adequate to safely complete daily activities?: Yes Is the patient deaf or have difficulty hearing?: No Does the patient have difficulty seeing, even when wearing glasses/contacts?: Yes Does the patient have difficulty concentrating, remembering, or making decisions?: No Patient able to express need for  assistance with ADLs?: Yes Does the patient have difficulty dressing or bathing?: No Independently performs ADLs?: Yes (appropriate for developmental age) Does the patient have difficulty walking or climbing stairs?: No Weakness of Legs: None Weakness of Arms/Hands: None  Permission Sought/Granted Permission sought to share information with : Case Manager,Family Supports Permission granted to share information with : Yes, Verbal Permission Granted  Share Information with NAME: Dawn     Permission granted to share info w Relationship: wife     Emotional Assessment Appearance:: Appears stated age Attitude/Demeanor/Rapport: Engaged Affect (typically observed): Accepting Orientation: : Oriented to Self,Oriented to Place,Oriented to  Time,Oriented to Situation Alcohol / Substance Use: Not Applicable Psych Involvement: No (comment)  Admission diagnosis:  CAP (community acquired pneumonia) [J18.9] Patient Active Problem List   Diagnosis Date Noted  . CAP (community acquired pneumonia) 08/09/2020  . Smoking history 11/09/2019  . Unintentional weight loss   . Benign neoplasm of descending colon   . Polyp of sigmoid colon   . Gastritis without bleeding   . Duodenitis   . Weight loss 01/24/2017  . Other specified hypothyroidism 06/22/2016  . 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 [place] [address] Phone: [phone] Fax: [phone]     Social Determinants of Health (SDOH) Interventions  Readmission Risk Interventions No flowsheet data found.

## 2020-08-11 LAB — BASIC METABOLIC PANEL
Anion gap: 8 (ref 5–15)
BUN: 13 mg/dL (ref 8–23)
CO2: 24 mmol/L (ref 22–32)
Calcium: 8.6 mg/dL — ABNORMAL LOW (ref 8.9–10.3)
Chloride: 108 mmol/L (ref 98–111)
Creatinine, Ser: 0.73 mg/dL (ref 0.61–1.24)
GFR, Estimated: >60 mL/min (ref >60)
Glucose, Bld: 102 mg/dL — ABNORMAL HIGH (ref 70–99)
Potassium: 3.4 mmol/L — ABNORMAL LOW (ref 3.5–5.1)
Sodium: 140 mmol/L (ref 135–145)

## 2020-08-11 LAB — MAGNESIUM: Magnesium: 2 mg/dL (ref 1.7–2.4)

## 2020-08-11 LAB — CBC
HCT: 36.8 % — ABNORMAL LOW (ref 39.0–52.0)
Hemoglobin: 12.7 g/dL — ABNORMAL LOW (ref 13.0–17.0)
MCH: 29.9 pg (ref 26.0–34.0)
MCHC: 34.5 g/dL (ref 30.0–36.0)
MCV: 86.6 fL (ref 80.0–100.0)
Platelets: 207 10*3/uL (ref 150–400)
RBC: 4.25 MIL/uL (ref 4.22–5.81)
RDW: 12.4 % (ref 11.5–15.5)
WBC: 9.8 10*3/uL (ref 4.0–10.5)
nRBC: 0 % (ref 0.0–0.2)

## 2020-08-11 MED ORDER — POTASSIUM CHLORIDE CRYS ER 20 MEQ PO TBCR
40.0000 meq | EXTENDED_RELEASE_TABLET | Freq: Once | ORAL | Status: AC
  Administered 2020-08-11: 40 meq via ORAL
  Filled 2020-08-11: qty 2

## 2020-08-11 MED ORDER — LOSARTAN POTASSIUM 100 MG PO TABS: ORAL_TABLET | ORAL | Status: DC

## 2020-08-11 MED ORDER — LEVOTHYROXINE SODIUM 50 MCG PO TABS
75.0000 ug | ORAL_TABLET | Freq: Every day | ORAL | Status: DC
  Administered 2020-08-11: 75 ug via ORAL
  Filled 2020-08-11: qty 1

## 2020-08-11 MED ORDER — IPRATROPIUM-ALBUTEROL 0.5-2.5 (3) MG/3ML IN SOLN: 3.0000 mL | Freq: Four times a day (QID) | RESPIRATORY_TRACT | Status: DC

## 2020-08-11 MED ORDER — LEVOFLOXACIN 750 MG PO TABS: 750.0000 mg | ORAL_TABLET | Freq: Every day | ORAL | 0 refills | Status: AC

## 2020-08-11 MED ORDER — AZITHROMYCIN 500 MG PO TABS
500.0000 mg | ORAL_TABLET | Freq: Every day | ORAL | Status: DC
  Administered 2020-08-11: 500 mg via ORAL
  Filled 2020-08-11: qty 1

## 2020-08-11 MED ORDER — ALBUTEROL SULFATE (2.5 MG/3ML) 0.083% IN NEBU: 2.5000 mg | INHALATION_SOLUTION | RESPIRATORY_TRACT | Status: DC | PRN

## 2020-08-11 MED ORDER — LEVOTHYROXINE SODIUM 75 MCG PO TABS: 75.0000 ug | ORAL_TABLET | Freq: Every day | ORAL | 0 refills | Status: DC

## 2020-08-11 MED ORDER — SODIUM CHLORIDE 0.9 % IV SOLN
2.0000 g | INTRAVENOUS | Status: DC
  Administered 2020-08-11: 10:00:00 2 g via INTRAVENOUS
  Filled 2020-08-11: qty 2

## 2020-08-11 NOTE — Discharge Summary (Signed)
Physician Discharge Summary   Benjamin Torres  male DOB: [birth year]  GGE:[id number]  PCP: Rusty Aus, MD  Admit date: 08/09/2020 Discharge date: 08/11/2020  Admitted From: home Disposition:  home CODE STATUS: Full code  Discharge Instructions    Discharge instructions   Complete by: As directed    You have received 3 days of IV antibiotics for your pneumonia and have improved, so you can go home to finish 2 more days of oral antibiotic Levaquin starting tomorrow 4/16.  Your thyroid hormone is high.  I have reduced your Synthroid.  Please follow up with your primary care doctor to check your thyroid hormone level 2-3 months from now.   Dr. Enzo Bi [facility] Course:  For full details, please see H&P, progress notes, consult notes and ancillary notes.  Briefly,  Benjamin Torres a 65 y.o.malewith medical history significant forformer tobacco use, COPD not on oxygen supplementation, prior throat cancer in 2015, essential hypertension, hyperlipidemia, hypothyroidism who presented to [facility] ED from home via EMS with complaints of worsening shortness of breath.   Sepsis secondary to community-acquired pneumonia, POA. Presented with worsening dyspnea at rest. Leukocytosis on presentation with WBC 18,000, heart rate in the 150s, evidence of left lower lobe pneumonia on CT chest. Started on Rocephin and azithromycin in the ED and IVF. Pt received 3 days of Rocephin and azithromycin with rapid improvement in his respiratory symptoms, so pt was discharged to finish 2 more days of Levaquin.  Resolved A. fib with RVR  On presentation was in A. fib with RVR with a rate in the 150s. Converted spontaneously to sinus rhythm.  Likely due to acute illness. --No need for further management unless recurring  Hypothyroidism TSH low and free T4 high (1.24). --reduced dose of home Synthroid from 112 down to 75  COPD exacerbation Current vape use Prior heavy  smoker --DuoNeb QID while awake during hospitalization. --cont home bronchodilators. --Pt advised to avoid vaping.  Essential hypertension --cont home Toprol  Hyperlipidemia --cont home statin  BPH --cont home flomax   Discharge Diagnoses:  Active Problems:   CAP (community acquired pneumonia)   Pneumonia   74 Day Unplanned Readmission Risk Score   Flowsheet Row ED to Hosp-Admission (Current) from 08/09/2020 in [place] (1C)  30 Day Unplanned Readmission Risk Score (%) 10.98 Filed at 08/11/2020 0801     This score is the patient's risk of an unplanned readmission within 30 days of being discharged (0 -100%). The score is based on dignosis, age, lab data, medications, orders, and past utilization.   Low:  0-14.9   Medium: 15-21.9   High: 22-29.9   Extreme: 30 and above        Discharge Instructions:  Allergies as of 08/11/2020   No Known Allergies     Medication List    STOP taking these medications   olmesartan 20 MG tablet Commonly known as: BENICAR     TAKE these medications   acetaminophen 500 MG tablet Commonly known as: TYLENOL Take 500-1,000 mg by mouth every 6 (six) hours as needed (for pain.).   albuterol 108 (90 Base) MCG/ACT inhaler Commonly known as: VENTOLIN HFA Inhale 1 puff into the lungs every 4 (four) hours as needed for wheezing or shortness of breath.   ibuprofen 200 MG tablet Commonly known as: ADVIL Take 400 mg by mouth as needed for headache.   levofloxacin 750 MG tablet Commonly known as:  Levaquin Take 1 tablet (750 mg total) by mouth daily for 2 days. Antibiotic for your pneumonia. Start taking on: August 12, 2020   levothyroxine 75 MCG tablet Commonly known as: SYNTHROID Take 1 tablet (75 mcg total) by mouth daily at 6 (six) AM. Start taking on: August 12, 2020 What changed:   medication strength  how much to take  when to take this   losartan 100 MG tablet Commonly known as:  COZAAR Check with your primary care provider to see if you are still taking this medicine. What changed:   how much to take  how to take this  when to take this  additional instructions   metoprolol succinate 25 MG 24 hr tablet Commonly known as: TOPROL-XL Take 25 mg by mouth daily.   rosuvastatin 10 MG tablet Commonly known as: CRESTOR Take 10 mg by mouth at bedtime.   tamsulosin 0.4 MG Caps capsule Commonly known as: FLOMAX Take 1 capsule (0.4 mg total) by mouth daily.   Trelegy Ellipta 200-62.5-25 MCG/INH Aepb Generic drug: Fluticasone-Umeclidin-Vilant Inhale 1 puff into the lungs daily at 2 PM.   vitamin B-12 1000 MCG tablet Commonly known as: CYANOCOBALAMIN Take 1,000 mcg by mouth 2 (two) times a week.        Follow-up Information    Rusty Aus, MD. Schedule an appointment as soon as possible for a visit in 1 week(s).   Specialty: Internal Medicine Contact information: [place] [address] [phone]               No Known Allergies   The results of significant diagnostics from this hospitalization (including imaging, microbiology, ancillary and laboratory) are listed below for reference.   Consultations:   Procedures/Studies: CT Angio Chest PE W and/or Wo Contrast  Result Date: 08/09/2020 CLINICAL DATA:  Chest pain and shortness of breath for 2 days EXAM: CT ANGIOGRAPHY CHEST WITH CONTRAST TECHNIQUE: Multidetector CT imaging of the chest was performed using the standard protocol during bolus administration of intravenous contrast. Multiplanar CT image reconstructions and MIPs were obtained to evaluate the vascular anatomy. CONTRAST:  48mL OMNIPAQUE IOHEXOL 350 MG/ML SOLN COMPARISON:  Chest x-ray from earlier in the same day. FINDINGS: Cardiovascular: Thoracic aorta and its branches are well visualized. No aneurysmal dilatation or dissection is seen. No cardiac enlargement is noted.  Coronary calcifications are seen. The pulmonary artery shows a normal branching pattern bilaterally. No filling defect to suggest pulmonary embolism is noted. Mediastinum/Nodes: Thoracic inlet is within normal limits. No sizable hilar or mediastinal adenopathy is noted. The esophagus as visualized is within normal limits. Small sliding-type hiatal hernia is noted. Lungs/Pleura: Lungs show diffuse emphysematous changes. No focal confluent infiltrate or sizable effusion is noted. Mild airspace opacity in the left lower lobe laterally is noted similar to that seen on recent chest x-ray suspicious for early infiltrate. No sizable parenchymal nodules are seen. Upper Abdomen: Visualized upper abdomen demonstrates evidence of cholelithiasis without complicating factors. Musculoskeletal: Degenerative changes of the thoracic spine are noted. No acute rib abnormality is seen. Review of the MIP images confirms the above findings. IMPRESSION: No evidence of pulmonary embolism. Early infiltrate in the lateral aspect of the left lower lobe. Electronically Signed   By: Inez Catalina M.D.   On: 08/09/2020 13:44   DG Chest Portable 1 View  Result Date: 08/09/2020 CLINICAL DATA:  Shortness of breath EXAM: PORTABLE CHEST 1 VIEW COMPARISON:  05/21/2014 FINDINGS: Airspace opacity noted at the  left lung base concerning for pneumonia. Right lung clear. Heart is normal size. No effusions or acute bony abnormality. IMPRESSION: Left basilar airspace opacity concerning for pneumonia. Electronically Signed   By: Rolm Baptise M.D.   On: 08/09/2020 11:50      Labs: BNP (last 3 results) Recent Labs    08/09/20 1046  BNP 86.7   Basic Metabolic Panel: Recent Labs  Lab 08/09/20 1046 08/10/20 0417 08/11/20 0502  NA 136 140 140  K 4.2 4.4 3.4*  CL 105 107 108  CO2 20* 25 24  GLUCOSE 155* 91 102*  BUN 20 14 13   CREATININE 0.74 0.87 0.73  CALCIUM 9.2 8.6* 8.6*  MG  --  1.7 2.0  PHOS  --  2.2*  --    Liver Function  Tests: Recent Labs  Lab 08/09/20 1046 08/10/20 0417  AST 23 18  ALT 20 16  ALKPHOS 55 41  BILITOT 1.1 0.9  PROT 7.6 6.1*  ALBUMIN 4.2 3.3*   No results for input(s): LIPASE, AMYLASE in the last 168 hours. No results for input(s): AMMONIA in the last 168 hours. CBC: Recent Labs  Lab 08/09/20 1046 08/10/20 0417 08/11/20 0502  WBC 18.4* 14.6* 9.8  NEUTROABS 16.3* 11.8*  --   HGB 16.0 13.3 12.7*  HCT 47.2 39.2 36.8*  MCV 86.8 88.3 86.6  PLT 215 196 207   Cardiac Enzymes: No results for input(s): CKTOTAL, CKMB, CKMBINDEX, TROPONINI in the last 168 hours. BNP: Invalid input(s): POCBNP CBG: No results for input(s): GLUCAP in the last 168 hours. D-Dimer No results for input(s): DDIMER in the last 72 hours. Hgb A1c Recent Labs    08/10/20 0417  HGBA1C 5.9*   Lipid Profile No results for input(s): CHOL, HDL, LDLCALC, TRIG, CHOLHDL, LDLDIRECT in the last 72 hours. Thyroid function studies Recent Labs    08/10/20 0417  TSH 0.075*   Anemia work up No results for input(s): VITAMINB12, FOLATE, FERRITIN, TIBC, IRON, RETICCTPCT in the last 72 hours. Urinalysis    Component Value Date/Time   COLORURINE YELLOW (A) 08/09/2020 1046   APPEARANCEUR CLEAR (A) 08/09/2020 1046   APPEARANCEUR Clear 03/06/2017 1414   LABSPEC 1.018 08/09/2020 1046   PHURINE 5.0 08/09/2020 1046   GLUCOSEU NEGATIVE 08/09/2020 1046   HGBUR SMALL (A) 08/09/2020 1046   BILIRUBINUR NEGATIVE 08/09/2020 1046   BILIRUBINUR Negative 03/06/2017 1414   KETONESUR 20 (A) 08/09/2020 1046   PROTEINUR 100 (A) 08/09/2020 1046   NITRITE NEGATIVE 08/09/2020 1046   LEUKOCYTESUR NEGATIVE 08/09/2020 1046   Sepsis Labs Invalid input(s): PROCALCITONIN,  WBC,  LACTICIDVEN Microbiology Recent Results (from the past 240 hour(s))  Culture, blood (Routine x 2)     Status: None (Preliminary result)   Collection Time: 08/09/20 10:41 AM   Specimen: BLOOD  Result Value Ref Range Status   Specimen Description BLOOD RT [facility]   Final   Special Requests   Final    BOTTLES DRAWN AEROBIC AND ANAEROBIC Blood Culture adequate volume   Culture   Final    NO GROWTH 2 DAYS Performed at [facility], [address], [place], [place] [address]    Report Status PENDING  Incomplete  Resp Panel by RT-PCR (Flu A&B, Covid) Nasopharyngeal Swab     Status: None   Collection Time: 08/09/20 10:41 AM   Specimen: Nasopharyngeal Swab; Nasopharyngeal(NP) swabs in vial transport medium  Result Value Ref Range Status   SARS Coronavirus 2 by RT PCR NEGATIVE NEGATIVE Final    Comment: (  NOTE) SARS-CoV-2 target nucleic acids are NOT DETECTED.  The SARS-CoV-2 RNA is generally detectable in upper respiratory specimens during the acute phase of infection. The lowest concentration of SARS-CoV-2 viral copies this assay can detect is 138 copies/mL. A negative result does not preclude SARS-Cov-2 infection and should not be used as the sole basis for treatment or other patient management decisions. A negative result may occur with  improper specimen collection/handling, submission of specimen other than nasopharyngeal swab, presence of viral mutation(s) within the areas targeted by this assay, and inadequate number of viral copies(<138 copies/mL). A negative result must be combined with clinical observations, patient history, and epidemiological information. The expected result is Negative.  Fact Sheet for Patients:  EntrepreneurPulse.com.au  Fact Sheet for Healthcare Providers:  IncredibleEmployment.be  This test is no t yet approved or cleared by the [place] FDA and  has been authorized for detection and/or diagnosis of SARS-CoV-2 by FDA under an Emergency Use Authorization (EUA). This EUA will remain  in effect (meaning this test can be used) for the duration of the COVID-19 declaration under Section 564(b)(1) of the Act, 21 U.S.C.section 360bbb-3(b)(1), unless the authorization is  terminated  or revoked sooner.       Influenza A by PCR NEGATIVE NEGATIVE Final   Influenza B by PCR NEGATIVE NEGATIVE Final    Comment: (NOTE) The Xpert Xpress SARS-CoV-2/FLU/RSV plus assay is intended as an aid in the diagnosis of influenza from Nasopharyngeal swab specimens and should not be used as a sole basis for treatment. Nasal washings and aspirates are unacceptable for Xpert Xpress SARS-CoV-2/FLU/RSV testing.  Fact Sheet for Patients: EntrepreneurPulse.com.au  Fact Sheet for Healthcare Providers: IncredibleEmployment.be  This test is not yet approved or cleared by the [place] FDA and has been authorized for detection and/or diagnosis of SARS-CoV-2 by FDA under an Emergency Use Authorization (EUA). This EUA will remain in effect (meaning this test can be used) for the duration of the COVID-19 declaration under Section 564(b)(1) of the Act, 21 U.S.C. section 360bbb-3(b)(1), unless the authorization is terminated or revoked.  Performed at [facility], [address] [place], [place] [address]   Culture, blood (Routine x 2)     Status: None (Preliminary result)   Collection Time: 08/09/20 10:46 AM   Specimen: BLOOD  Result Value Ref Range Status   Specimen Description BLOOD RT [facility]  Final   Special Requests   Final    BOTTLES DRAWN AEROBIC AND ANAEROBIC Blood Culture adequate volume   Culture   Final    NO GROWTH 2 DAYS Performed at [facility], [address] [place], [place] [address]    Report Status PENDING  Incomplete     Total time spend on discharging this patient, including the last patient exam, discussing the hospital stay, instructions for ongoing care as it relates to all pertinent caregivers, as well as preparing the medical discharge records, prescriptions, and/or referrals as applicable, is 35 minutes.    Enzo Bi, MD  [facility] 08/11/2020, 9:39 AM

## 2020-08-11 NOTE — Discharge Instructions (Signed)
Community-Acquired Pneumonia, Adult Pneumonia is an infection of the lungs. It causes irritation and swelling in the airways of the lungs. Mucus and fluid may also build up inside the airways. This may cause coughing and trouble breathing. One type of pneumonia can happen while you are in a hospital. A different type can happen when you are not in a hospital (community-acquired pneumonia). What are the causes? This condition is caused by germs (viruses, bacteria, or fungi). Some types of germs can spread from person to person. Pneumonia is not thought to spread from person to person.   What increases the risk? You are more likely to develop this condition if:  You have a long-term (chronic) disease, such as: ? Disease of the lungs. This may be chronic obstructive pulmonary disease (COPD) or asthma. ? Heart failure. ? Cystic fibrosis. ? Diabetes. ? Kidney disease. ? Sickle cell disease. ? HIV.  You have other health problems, such as: ? Your body's defense system (immune system) is weak. ? A condition that may cause you to breathe in fluids from your mouth and nose.  You had your spleen taken out.  You do not take good care of your teeth and mouth (poor dental hygiene).  You use or have used tobacco products.  You travel where the germs that cause this illness are common.  You are near certain animals or the places they live.  You are older than 65 years of age. What are the signs or symptoms? Symptoms of this condition include:  A cough.  A fever.  Sweating or chills.  Chest pain, often when you breathe deeply or cough.  Breathing problems, such as: ? Fast breathing. ? Trouble breathing. ? Shortness of breath.  Feeling tired (fatigued).  Muscle aches. How is this treated? Treatment for this condition depends on many things, such as:  The cause of your illness.  Your medicines.  Your other health problems. Most adults can be treated at home. Sometimes,  treatment must happen in a hospital.  Treatment may include medicines to kill germs.  Medicines may depend on which germ caused your illness. Very bad pneumonia is rare. If you get it, you may:  Have a machine to help you breathe.  Have fluid taken away from around your lungs. Follow these instructions at home: Medicines  Take over-the-counter and prescription medicines only as told by your doctor.  Take cough medicine only if you are losing sleep. Cough medicine can keep your body from taking mucus away from your lungs.  If you were prescribed an antibiotic medicine, take it as told by your doctor. Do not stop taking the antibiotic even if you start to feel better. Lifestyle  Do not drink alcohol.  Do not use any products that contain nicotine or tobacco, such as cigarettes, e-cigarettes, and chewing tobacco. If you need help quitting, ask your doctor.  Eat a healthy diet. This includes a lot of vegetables, fruits, whole grains, low-fat dairy products, and low-fat (lean) protein.      General instructions  Rest a lot. Sleep for at least 8 hours each night.  Sleep with your head and neck raised. Put a few pillows under your head or sleep in a reclining chair.  Return to your normal activities as told by your doctor. Ask your doctor what activities are safe for you.  Drink enough fluid to keep your pee (urine) pale yellow.  If your throat is sore, rinse your mouth often with salt water. To make salt   water, dissolve -1 tsp (3-6 g) of salt in 1 cup (237 mL) of warm water.  Keep all follow-up visits as told by your doctor. This is important.   How is this prevented? You can lower your risk of pneumonia by:  Getting the pneumonia shot (vaccine). These shots have different types and schedules. Ask your doctor what works best for you. Think about getting this shot if: ? You are older than 65 years of age. ? You are 19-65 years of age and:  You are being treated for  cancer.  You have long-term lung disease.  You have other problems that affect your body's defense system. Ask your doctor if you have one of these.  Getting your flu shot every year. Ask your doctor which type of shot is best for you.  Going to the dentist as often as told.  Washing your hands often with soap and water for at least 20 seconds. If you cannot use soap and water, use hand sanitizer. Contact a doctor if:  You have a fever.  You lose sleep because your cough medicine does not help. Get help right away if:  You are short of breath and this gets worse.  You have more chest pain.  Your sickness gets worse. This is very serious if: ? You are an older adult. ? Your body's defense system is weak.  You cough up blood. These symptoms may be an emergency. Do not wait to see if the symptoms will go away. Get medical help right away. Call your local emergency services (911 in the [place] Do not drive yourself to the hospital. Summary  Pneumonia is an infection of the lungs.  Community-acquired pneumonia affects people who have not been in the hospital. Certain germs can cause this infection.  This condition may be treated with medicines that kill germs.  For very bad pneumonia, you may need a hospital stay and treatment to help with breathing. This information is not intended to replace advice given to you by your health care provider. Make sure you discuss any questions you have with your health care provider. Document Revised: 01/26/2019 Document Reviewed: 01/26/2019 Elsevier Patient Education  2021 [employer]  

## 2020-08-11 NOTE — [place] Notes (Signed)
Discharge paperwork reviewed with patient. Verbalizes understanding of d/c plan and follow-up care. All questions answered.

## 2020-08-14 LAB — CULTURE, BLOOD (ROUTINE X 2)
Culture: NO GROWTH
Culture: NO GROWTH
Special Requests: ADEQUATE
Special Requests: ADEQUATE

## 2020-08-18 DIAGNOSIS — J189 Pneumonia, unspecified organism: Secondary | ICD-10-CM | POA: Diagnosis not present

## 2020-08-18 DIAGNOSIS — J449 Chronic obstructive pulmonary disease, unspecified: Secondary | ICD-10-CM | POA: Diagnosis not present

## 2020-09-20 DIAGNOSIS — J189 Pneumonia, unspecified organism: Secondary | ICD-10-CM | POA: Diagnosis not present

## 2020-11-02 ENCOUNTER — Inpatient Hospital Stay (HOSPITAL_BASED_OUTPATIENT_CLINIC_OR_DEPARTMENT_OTHER): Payer: Medicare HMO | Admitting: Oncology

## 2020-11-02 ENCOUNTER — Other Ambulatory Visit: Payer: Self-pay

## 2020-11-02 ENCOUNTER — Inpatient Hospital Stay: Payer: Medicare HMO | Attending: Oncology

## 2020-11-02 ENCOUNTER — Encounter: Payer: Self-pay | Admitting: Oncology

## 2020-11-02 VITALS — BP 101/77 | HR 70 | Temp 97.3°F | Resp 18 | Wt 162.5 lb

## 2020-11-02 DIAGNOSIS — E038 Other specified hypothyroidism: Secondary | ICD-10-CM | POA: Diagnosis not present

## 2020-11-02 DIAGNOSIS — Z79890 Hormone replacement therapy (postmenopausal): Secondary | ICD-10-CM | POA: Insufficient documentation

## 2020-11-02 DIAGNOSIS — F17290 Nicotine dependence, other tobacco product, uncomplicated: Secondary | ICD-10-CM | POA: Diagnosis not present

## 2020-11-02 DIAGNOSIS — C329 Malignant neoplasm of larynx, unspecified: Secondary | ICD-10-CM | POA: Diagnosis not present

## 2020-11-02 DIAGNOSIS — E039 Hypothyroidism, unspecified: Secondary | ICD-10-CM | POA: Diagnosis not present

## 2020-11-02 DIAGNOSIS — Z923 Personal history of irradiation: Secondary | ICD-10-CM | POA: Diagnosis not present

## 2020-11-02 DIAGNOSIS — Z87891 Personal history of nicotine dependence: Secondary | ICD-10-CM

## 2020-11-02 DIAGNOSIS — Z8521 Personal history of malignant neoplasm of larynx: Secondary | ICD-10-CM | POA: Diagnosis not present

## 2020-11-02 LAB — CBC WITH DIFFERENTIAL/PLATELET
Abs Immature Granulocytes: 0.02 10*3/uL (ref 0.00–0.07)
Basophils Absolute: 0.1 10*3/uL (ref 0.0–0.1)
Basophils Relative: 1 %
Eosinophils Absolute: 0.2 10*3/uL (ref 0.0–0.5)
Eosinophils Relative: 3 %
HCT: 44.9 % (ref 39.0–52.0)
Hemoglobin: 15.1 g/dL (ref 13.0–17.0)
Immature Granulocytes: 0 %
Lymphocytes Relative: 22 %
Lymphs Abs: 1.7 10*3/uL (ref 0.7–4.0)
MCH: 28.8 pg (ref 26.0–34.0)
MCHC: 33.6 g/dL (ref 30.0–36.0)
MCV: 85.7 fL (ref 80.0–100.0)
Monocytes Absolute: 0.8 10*3/uL (ref 0.1–1.0)
Monocytes Relative: 11 %
Neutro Abs: 4.9 10*3/uL (ref 1.7–7.7)
Neutrophils Relative %: 63 %
Platelets: 217 10*3/uL (ref 150–400)
RBC: 5.24 MIL/uL (ref 4.22–5.81)
RDW: 13 % (ref 11.5–15.5)
WBC: 7.7 10*3/uL (ref 4.0–10.5)
nRBC: 0 % (ref 0.0–0.2)

## 2020-11-02 LAB — COMPREHENSIVE METABOLIC PANEL
ALT: 16 U/L (ref 0–44)
AST: 21 U/L (ref 15–41)
Albumin: 4.3 g/dL (ref 3.5–5.0)
Alkaline Phosphatase: 53 U/L (ref 38–126)
Anion gap: 7 (ref 5–15)
BUN: 12 mg/dL (ref 8–23)
CO2: 24 mmol/L (ref 22–32)
Calcium: 9.1 mg/dL (ref 8.9–10.3)
Chloride: 103 mmol/L (ref 98–111)
Creatinine, Ser: 0.84 mg/dL (ref 0.61–1.24)
GFR, Estimated: >60 mL/min (ref >60)
Glucose, Bld: 111 mg/dL — ABNORMAL HIGH (ref 70–99)
Potassium: 4.4 mmol/L (ref 3.5–5.1)
Sodium: 134 mmol/L — ABNORMAL LOW (ref 135–145)
Total Bilirubin: 0.9 mg/dL (ref 0.3–1.2)
Total Protein: 7.4 g/dL (ref 6.5–8.1)

## 2020-11-02 LAB — T4, FREE: Free T4: 1.23 ng/dL — ABNORMAL HIGH (ref 0.61–1.12)

## 2020-11-02 LAB — TSH: TSH: 2.205 u[IU]/mL (ref 0.350–4.500)

## 2020-11-02 NOTE — Progress Notes (Signed)
[facility]  [address], [address] [place], [place] [address] Phone: [phone]  Fax: [phone]   Clinic Day:  11/03/2019  Referring physician: Rusty Aus, MD  Chief Complaint: Benjamin Torres is a 65 y.o. 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 PET scan on 08/18/2013 revealed hypermetabolic soft tissue lesion in the right hypopharynx consistent with epiglottic lesion. There also was a high right paratracheal lymph node which was also hypermetabolic and suspicious for metastatic disease.  Clinical stage was T2N1M0.   He was treated with radiation therapy and cetuximab beginning 09/07/2013.  He received 7,000 cGy from 09/07/2013 - 11/10/2013.     06/21/2015 PET scan on  revealed no evidence of recurrent disease.  02/12/2017 PET revealed no evidence of hypermetabolic locoregional or distant metastatic disease.  11/2016 ENT evaluation iwas negative per patient.   He has hypothyroidism.  He is on Synthroid.   He has a posterior bladder diverticulum and prostatomegaly, which causes him to experience LUTS. PSA was 1.91 on 01/13/2017.   EGD on 03/07/2017 revealed a medium sized hiatal hernia, moderate inflammation in the stomach, and mild inflammation in the duodenal bulb. Findings c/w gastritis and duodenitis. Pathology demonstrated chronic inactive gastritis. Negative for H.pylori.    Colonoscopy on 03/07/2017 revealed two 4-5 mm sessile polyps in the descending colon, one 2 mm polyp in the sigmoid colon, diverticulosis, and non-bleeding internal hemorrhoids. Pathology  showed that the 2 polyps from the descending colon were tubular adenomas, and the lesion from the sigmoid was a hyperplastic polyp.   He would like to proceed with yearly check ups. Today he reports feeling well, no new complaints. Chronic sob with exertion unchanged.  Denies any dysphagia, neck mass, cough etc.  April 2022 hospitalized due to pneumonia.   Past Medical History:  Diagnosis Date   Cancer of larynx ([place]) 11/06/2014   COPD (chronic obstructive pulmonary disease) ([facility])    Hypothyroidism    PE (pulmonary thromboembolism) ([place])    right lung, over 10 yrs ago    Past Surgical History:  Procedure Laterality Date   ANAL FISSURE REPAIR     APPENDECTOMY     BIOPSY PHARYNX  2015   Larynx BX   COLONOSCOPY WITH PROPOFOL N/A 03/07/2017   Procedure: COLONOSCOPY WITH PROPOFOL;  Surgeon: Lucilla Lame, MD;  Location: [facility];  Service: Endoscopy;  Laterality: N/A;   ESOPHAGOGASTRODUODENOSCOPY (EGD) WITH PROPOFOL N/A 03/07/2017   Procedure: ESOPHAGOGASTRODUODENOSCOPY (EGD) WITH PROPOFOL;  Surgeon: Lucilla Lame, MD;  Location: [facility];  Service: Endoscopy;  Laterality: N/A;   LEFT HEART CATH AND CORONARY ANGIOGRAPHY Left 09/23/2019   Procedure: LEFT HEART CATH AND CORONARY ANGIOGRAPHY;  Surgeon: Teodoro Spray, MD;  Location: [facility];  Service: Cardiovascular;  Laterality: Left;   POLYPECTOMY  03/07/2017   Procedure: POLYPECTOMY;  Surgeon: Lucilla Lame, MD;  Location: [facility];  Service: Endoscopy;;    History reviewed. No pertinent family history.  Social History:  reports that he has been smoking e-cigarettes. He has a 60.00 pack-year smoking history. He has never used smokeless tobacco. He reports that he does not drink  alcohol. No history on file for drug use. He likes to ride motorcycles.  He likes to play soft ball.  He works a Armed forces logistics/support/administrative officer. The patient is alone today.  Allergies: No Known Allergies  Current Medications: Current  Outpatient Medications  Medication Sig Dispense Refill   albuterol (PROVENTIL HFA;VENTOLIN HFA) 108 (90 Base) MCG/ACT inhaler Inhale 1 puff into the lungs every 4 (four) hours as needed for wheezing or shortness of breath.     levothyroxine (SYNTHROID) 75 MCG tablet Take 1 tablet (75 mcg total) by mouth daily at 6 (six) AM. 90 tablet 0   losartan (COZAAR) 100 MG tablet Check with your primary care provider to see if you are still taking this medicine.     metoprolol succinate (TOPROL-XL) 25 MG 24 hr tablet Take 25 mg by mouth daily.     rosuvastatin (CRESTOR) 10 MG tablet Take 10 mg by mouth at bedtime.     tamsulosin (FLOMAX) 0.4 MG CAPS capsule Take 1 capsule (0.4 mg total) by mouth daily. 90 capsule 3   TRELEGY ELLIPTA 200-62.5-25 MCG/INH AEPB Inhale 1 puff into the lungs daily at 2 PM.      vitamin B-12 (CYANOCOBALAMIN) 1000 MCG tablet Take 1,000 mcg by mouth 2 (two) times a week.     acetaminophen (TYLENOL) 500 MG tablet Take 500-1,000 mg by mouth every 6 (six) hours as needed (for pain.). (Patient not taking: Reported on 11/02/2020)     ibuprofen (ADVIL) 200 MG tablet Take 400 mg by mouth as needed for headache. (Patient not taking: Reported on 11/02/2020)     No current facility-administered medications for this visit.   Facility-Administered Medications Ordered in Other Visits  Medication Dose Route Frequency Provider Last Rate Last Admin   sodium chloride flush (NS) 0.9 % injection 3 mL  3 mL Intravenous Q12H Teodoro Spray, MD        Review of Systems  Constitutional: Negative.  Negative for chills, diaphoresis, fever, malaise/fatigue and weight loss (up 10 pound).  HENT: Negative.  Negative for congestion, ear discharge, ear pain, hearing loss, nosebleeds, sinus pain, sore throat and tinnitus.   Eyes: Negative.  Negative for blurred vision, double vision, photophobia and pain.  Respiratory:  Positive for shortness of breath. Negative for cough, hemoptysis, sputum production and  wheezing.   Cardiovascular: Negative.  Negative for chest pain, palpitations, orthopnea, leg swelling and PND.  Gastrointestinal: Negative.  Negative for abdominal pain, blood in stool, constipation, diarrhea, heartburn, melena, nausea and vomiting.  Genitourinary: Negative.  Negative for dysuria, frequency, hematuria and urgency.       LUTS; difficulty initiating stream, voids small amounts, nocturia. Known prostatomegaly and posterior bladder diverticulum; sees urology  Musculoskeletal: Negative.  Negative for back pain, falls, joint pain, myalgias and neck pain.  Skin: Negative.  Negative for itching and rash.  Neurological: Negative.  Negative for dizziness, tremors, speech change, focal weakness, weakness and headaches.  Endo/Heme/Allergies: Negative.  Does not bruise/bleed easily.       HYPOthyroidism on levothyroxine.  Psychiatric/Behavioral:  Negative for depression, memory loss and suicidal ideas. The patient is not nervous/anxious and does not have insomnia.   All other systems reviewed and are negative.  Performance status (ECOG): 0 Vitals:   11/02/20 1346  BP: 101/77  Pulse: 70  Resp: 18  Temp: (!) 97.3 F (36.3 C)     Physical Exam Vitals and nursing note reviewed.  Constitutional:      General: He is not in acute distress.  Appearance: He is well-developed.  HENT:     Head: Normocephalic and atraumatic.     Mouth/Throat:     Mouth: Mucous membranes are moist.     Pharynx: Oropharynx is clear.  Eyes:     General: No scleral icterus.    Pupils: Pupils are equal, round, and reactive to light.  Cardiovascular:     Rate and Rhythm: Normal rate and regular rhythm.     Heart sounds: Normal heart sounds. No murmur heard. Pulmonary:     Effort: Pulmonary effort is normal. No respiratory distress.     Breath sounds: Normal breath sounds. No wheezing or rales.  Chest:     Chest wall: No tenderness.  Breasts:    Right: No supraclavicular adenopathy.     Left: No  supraclavicular adenopathy.  Abdominal:     General: Bowel sounds are normal. There is no distension.     Palpations: Abdomen is soft. There is no mass.     Tenderness: There is no abdominal tenderness. There is no guarding or rebound.  Musculoskeletal:        General: No swelling or tenderness. Normal range of motion.     Cervical back: Normal range of motion and neck supple.  Lymphadenopathy:     Head:     Right side of head: No preauricular, posterior auricular or occipital adenopathy.     Left side of head: No preauricular, posterior auricular or occipital adenopathy.     Cervical: No cervical adenopathy.     Upper Body:     Right upper body: No supraclavicular adenopathy.     Left upper body: No supraclavicular adenopathy.     Lower Body: No right inguinal adenopathy. No left inguinal adenopathy.  Skin:    General: Skin is warm and dry.  Neurological:     Mental Status: He is alert and oriented to person, place, and time.  Psychiatric:        Behavior: Behavior normal.        Thought Content: Thought content normal.        Judgment: Judgment normal.     Appointment on 11/02/2020  Component Date Value Ref Range Status   Free T4 11/02/2020 1.23 (A) 0.61 - 1.12 ng/dL Final   Comment: (NOTE) Biotin ingestion may interfere with free T4 tests. If the results are inconsistent with the TSH level, previous test results, or the clinical presentation, then consider biotin interference. If needed, order repeat testing after stopping biotin. Performed at [facility], [address] [place], [place] [address]    TSH 11/02/2020 2.205  0.350 - 4.500 uIU/mL Final   Comment: Performed by a 3rd Generation assay with a functional sensitivity of <=0.01 uIU/mL. Performed at [facility], [address] [place], [place] [address]    WBC 11/02/2020 7.7  4.0 - 10.5 K/uL Final   RBC 11/02/2020 5.24  4.22 - 5.81 MIL/uL Final   Hemoglobin 11/02/2020 15.1  13.0 - 17.0 g/dL  Final   HCT 11/02/2020 44.9  39.0 - 52.0 % Final   MCV 11/02/2020 85.7  80.0 - 100.0 fL Final   MCH 11/02/2020 28.8  26.0 - 34.0 pg Final   MCHC 11/02/2020 33.6  30.0 - 36.0 g/dL Final   RDW 11/02/2020 13.0  11.5 - 15.5 % Final   Platelets 11/02/2020 217  150 - 400 K/uL Final   nRBC 11/02/2020 0.0  0.0 - 0.2 % Final   Neutrophils Relative % 11/02/2020 63  % Final  Neutro Abs 11/02/2020 4.9  1.7 - 7.7 K/uL Final   Lymphocytes Relative 11/02/2020 22  % Final   Lymphs Abs 11/02/2020 1.7  0.7 - 4.0 K/uL Final   Monocytes Relative 11/02/2020 11  % Final   Monocytes Absolute 11/02/2020 0.8  0.1 - 1.0 K/uL Final   Eosinophils Relative 11/02/2020 3  % Final   Eosinophils Absolute 11/02/2020 0.2  0.0 - 0.5 K/uL Final   Basophils Relative 11/02/2020 1  % Final   Basophils Absolute 11/02/2020 0.1  0.0 - 0.1 K/uL Final   Immature Granulocytes 11/02/2020 0  % Final   Abs Immature Granulocytes 11/02/2020 0.02  0.00 - 0.07 K/uL Final   Performed at [facility], [address], [place], [place] [address]   Sodium 11/02/2020 134 (A) 135 - 145 mmol/L Final   Potassium 11/02/2020 4.4  3.5 - 5.1 mmol/L Final   Chloride 11/02/2020 103  98 - 111 mmol/L Final   CO2 11/02/2020 24  22 - 32 mmol/L Final   Glucose, Bld 11/02/2020 111 (A) 70 - 99 mg/dL Final   Glucose reference range applies only to samples taken after fasting for at least 8 hours.   BUN 11/02/2020 12  8 - 23 mg/dL Final   Creatinine, Ser 11/02/2020 0.84  0.61 - 1.24 mg/dL Final   Calcium 11/02/2020 9.1  8.9 - 10.3 mg/dL Final   Total Protein 11/02/2020 7.4  6.5 - 8.1 g/dL Final   Albumin 11/02/2020 4.3  3.5 - 5.0 g/dL Final   AST 11/02/2020 21  15 - 41 U/L Final   ALT 11/02/2020 16  0 - 44 U/L Final   Alkaline Phosphatase 11/02/2020 53  38 - 126 U/L Final   Total Bilirubin 11/02/2020 0.9  0.3 - 1.2 mg/dL Final   GFR, Estimated 11/02/2020 >60  >60 mL/min Final   Comment: (NOTE) Calculated using the CKD-EPI Creatinine  Equation (2021)    Anion gap 11/02/2020 7  5 - 15 Final   Performed at [facility], [address], [place],  [address]    Assessment and plan 1. Smoking history   2. Cancer of larynx ([place])   3. Other specified hypothyroidism    # Stage III supraglottic larynx cancer Clinically he is doing well.  Exam showed no palpable neck lymphadenopathy.  He is 7 year post completion of treatment.  Image will be obtained if clinically indicated.  Patient wishes to conitnue follow up annualy.  Check thyroid function annually.              #  Smoking history Obtain CT chest lung cancer screening in April 2023           # acquired Hypothyroidism TSH 2.205, free T4 1.23 continue synthroid 11mcg daily.   5.   RTC in 1 year for MD assessment and labs (CBC with diff, CMP, TSH, free T4).   I discussed the assessment and treatment plan with the patient.  The patient was provided an opportunity to ask questions and all were answered.  The patient agreed with the plan and demonstrated an understanding of the instructions.  The patient was advised to call back or seek an in person evaluation if the symptoms worsen or if the condition fails to improve as anticipated.  Earlie Server, MD, PhD Hematology Oncology [place] at [facility] 11/02/2020

## 2020-11-02 NOTE — Progress Notes (Signed)
Pt here for follow up. No new concerns voiced.   

## 2020-11-14 DIAGNOSIS — R059 Cough, unspecified: Secondary | ICD-10-CM | POA: Diagnosis not present

## 2020-11-14 DIAGNOSIS — R0600 Dyspnea, unspecified: Secondary | ICD-10-CM | POA: Diagnosis not present

## 2020-11-14 DIAGNOSIS — R509 Fever, unspecified: Secondary | ICD-10-CM | POA: Diagnosis not present

## 2020-11-14 DIAGNOSIS — J441 Chronic obstructive pulmonary disease with (acute) exacerbation: Secondary | ICD-10-CM | POA: Diagnosis not present

## 2021-01-23 DIAGNOSIS — E782 Mixed hyperlipidemia: Secondary | ICD-10-CM | POA: Diagnosis not present

## 2021-01-23 DIAGNOSIS — E538 Deficiency of other specified B group vitamins: Secondary | ICD-10-CM | POA: Diagnosis not present

## 2021-01-30 DIAGNOSIS — Z125 Encounter for screening for malignant neoplasm of prostate: Secondary | ICD-10-CM | POA: Diagnosis not present

## 2021-01-30 DIAGNOSIS — I429 Cardiomyopathy, unspecified: Secondary | ICD-10-CM | POA: Diagnosis not present

## 2021-01-30 DIAGNOSIS — D510 Vitamin B12 deficiency anemia due to intrinsic factor deficiency: Secondary | ICD-10-CM | POA: Diagnosis not present

## 2021-01-30 DIAGNOSIS — I2510 Atherosclerotic heart disease of native coronary artery without angina pectoris: Secondary | ICD-10-CM | POA: Diagnosis not present

## 2021-01-30 DIAGNOSIS — J449 Chronic obstructive pulmonary disease, unspecified: Secondary | ICD-10-CM | POA: Diagnosis not present

## 2021-05-07 ENCOUNTER — Other Ambulatory Visit: Payer: Self-pay

## 2021-05-09 DIAGNOSIS — J441 Chronic obstructive pulmonary disease with (acute) exacerbation: Secondary | ICD-10-CM | POA: Diagnosis not present

## 2021-05-09 DIAGNOSIS — Z20822 Contact with and (suspected) exposure to covid-19: Secondary | ICD-10-CM | POA: Diagnosis not present

## 2021-05-09 DIAGNOSIS — J209 Acute bronchitis, unspecified: Secondary | ICD-10-CM | POA: Diagnosis not present

## 2021-07-26 DIAGNOSIS — Z125 Encounter for screening for malignant neoplasm of prostate: Secondary | ICD-10-CM | POA: Diagnosis not present

## 2021-07-26 DIAGNOSIS — R739 Hyperglycemia, unspecified: Secondary | ICD-10-CM | POA: Diagnosis not present

## 2021-07-26 DIAGNOSIS — E538 Deficiency of other specified B group vitamins: Secondary | ICD-10-CM | POA: Diagnosis not present

## 2021-07-26 DIAGNOSIS — E782 Mixed hyperlipidemia: Secondary | ICD-10-CM | POA: Diagnosis not present

## 2021-07-30 ENCOUNTER — Ambulatory Visit
Admission: RE | Admit: 2021-07-30 | Discharge: 2021-07-30 | Disposition: A | Discharge status: Home / Self Care | Payer: Medicare HMO | Source: Ambulatory Visit | Attending: Oncology | Admitting: Oncology

## 2021-07-30 DIAGNOSIS — F17210 Nicotine dependence, cigarettes, uncomplicated: Secondary | ICD-10-CM | POA: Diagnosis not present

## 2021-07-30 DIAGNOSIS — Z87891 Personal history of nicotine dependence: Secondary | ICD-10-CM | POA: Insufficient documentation

## 2021-07-31 ENCOUNTER — Telehealth: Payer: Self-pay | Admitting: *Deleted

## 2021-07-31 NOTE — Telephone Encounter (Signed)
Called report ? ?IMPRESSION: ?1. Lung-RADS 4A, suspicious. Follow up low-dose chest CT without ?contrast in 3 months (please use the following order, "CT CHEST LCS ?NODULE FOLLOW-UP W/O CM") is recommended. Alternatively, PET may be ?considered when there is a solid component 32m or larger. Left upper ?and left lower lobe nodule as detailed above. ?2. Aortic atherosclerosis (ICD10-I70.0), coronary artery ?atherosclerosis and emphysema (ICD10-J43.9). ?  ?  ?Electronically Signed ?  By: KAbigail MiyamotoM.D. ?  On: 07/31/2021 09:47 ?

## 2021-08-01 ENCOUNTER — Telehealth: Payer: Self-pay

## 2021-08-01 DIAGNOSIS — R911 Solitary pulmonary nodule: Secondary | ICD-10-CM

## 2021-08-01 NOTE — Telephone Encounter (Signed)
Please schedule patient to get CT chest w/out contrast 2 days prior to hie appt with Dr. Tasia Catchings on 7/6. Please inform patient of appointment. Thanks  ?

## 2021-08-01 NOTE — Telephone Encounter (Signed)
-----   Message from Earlie Server, MD sent at 07/31/2021  9:21 PM EDT ----- ?Please let him know that there is a small nodule in left lung that needs to be watched.  ?Recommend him to repeat CT in 3 months. [ 2 days prior to his 7/6 appt w me]. ?Please order CT CHEST LCS NODULE FOLLOW-UP W/O CM  - reason is lung nodule ?

## 2021-08-02 DIAGNOSIS — Z0000 Encounter for general adult medical examination without abnormal findings: Secondary | ICD-10-CM | POA: Diagnosis not present

## 2021-08-02 DIAGNOSIS — Z8521 Personal history of malignant neoplasm of larynx: Secondary | ICD-10-CM | POA: Diagnosis not present

## 2021-08-02 DIAGNOSIS — I429 Cardiomyopathy, unspecified: Secondary | ICD-10-CM | POA: Diagnosis not present

## 2021-08-02 DIAGNOSIS — J449 Chronic obstructive pulmonary disease, unspecified: Secondary | ICD-10-CM | POA: Diagnosis not present

## 2021-08-02 DIAGNOSIS — E785 Hyperlipidemia, unspecified: Secondary | ICD-10-CM | POA: Diagnosis not present

## 2021-08-02 DIAGNOSIS — Z1389 Encounter for screening for other disorder: Secondary | ICD-10-CM | POA: Diagnosis not present

## 2021-10-29 ENCOUNTER — Ambulatory Visit
Admission: RE | Admit: 2021-10-29 | Discharge: 2021-10-29 | Disposition: A | Discharge status: Home / Self Care | Payer: Medicare HMO | Source: Ambulatory Visit | Attending: Oncology | Admitting: Oncology

## 2021-10-29 DIAGNOSIS — J439 Emphysema, unspecified: Secondary | ICD-10-CM | POA: Diagnosis not present

## 2021-10-29 DIAGNOSIS — R911 Solitary pulmonary nodule: Secondary | ICD-10-CM | POA: Insufficient documentation

## 2021-10-29 DIAGNOSIS — Z87891 Personal history of nicotine dependence: Secondary | ICD-10-CM | POA: Diagnosis not present

## 2021-10-29 DIAGNOSIS — I700 Atherosclerosis of aorta: Secondary | ICD-10-CM | POA: Insufficient documentation

## 2021-10-29 DIAGNOSIS — Z122 Encounter for screening for malignant neoplasm of respiratory organs: Secondary | ICD-10-CM | POA: Insufficient documentation

## 2021-10-31 ENCOUNTER — Telehealth: Payer: Self-pay

## 2021-10-31 ENCOUNTER — Other Ambulatory Visit: Payer: Self-pay | Admitting: Oncology

## 2021-10-31 DIAGNOSIS — R911 Solitary pulmonary nodule: Secondary | ICD-10-CM

## 2021-10-31 NOTE — Telephone Encounter (Signed)
Dr. Tasia Catchings just viewed  results. Thanks

## 2021-10-31 NOTE — Telephone Encounter (Signed)
[facility] radiology calling to confirm that Dr Tasia Catchings received a call report for this patient's CT scan.   Call back # [phone]

## 2021-11-01 ENCOUNTER — Encounter: Payer: Self-pay | Admitting: Oncology

## 2021-11-01 ENCOUNTER — Inpatient Hospital Stay: Payer: Medicare HMO | Admitting: Oncology

## 2021-11-01 ENCOUNTER — Inpatient Hospital Stay: Payer: Medicare HMO | Attending: Oncology

## 2021-11-01 ENCOUNTER — Ambulatory Visit: Payer: Medicare HMO | Admitting: Oncology

## 2021-11-01 ENCOUNTER — Other Ambulatory Visit: Payer: Medicare HMO

## 2021-11-01 VITALS — BP 116/91 | HR 93 | Temp 98.2°F | Ht 67.0 in | Wt 160.0 lb

## 2021-11-01 DIAGNOSIS — Z8521 Personal history of malignant neoplasm of larynx: Secondary | ICD-10-CM | POA: Insufficient documentation

## 2021-11-01 DIAGNOSIS — C329 Malignant neoplasm of larynx, unspecified: Secondary | ICD-10-CM

## 2021-11-01 DIAGNOSIS — E038 Other specified hypothyroidism: Secondary | ICD-10-CM | POA: Insufficient documentation

## 2021-11-01 DIAGNOSIS — R911 Solitary pulmonary nodule: Secondary | ICD-10-CM

## 2021-11-01 DIAGNOSIS — F17290 Nicotine dependence, other tobacco product, uncomplicated: Secondary | ICD-10-CM | POA: Insufficient documentation

## 2021-11-01 DIAGNOSIS — R918 Other nonspecific abnormal finding of lung field: Secondary | ICD-10-CM | POA: Insufficient documentation

## 2021-11-01 DIAGNOSIS — Z79899 Other long term (current) drug therapy: Secondary | ICD-10-CM | POA: Diagnosis not present

## 2021-11-01 DIAGNOSIS — Z87891 Personal history of nicotine dependence: Secondary | ICD-10-CM

## 2021-11-01 LAB — COMPREHENSIVE METABOLIC PANEL
ALT: 17 U/L (ref 0–44)
AST: 22 U/L (ref 15–41)
Albumin: 4.2 g/dL (ref 3.5–5.0)
Alkaline Phosphatase: 50 U/L (ref 38–126)
Anion gap: 6 (ref 5–15)
BUN: 12 mg/dL (ref 8–23)
CO2: 27 mmol/L (ref 22–32)
Calcium: 8.7 mg/dL — ABNORMAL LOW (ref 8.9–10.3)
Chloride: 101 mmol/L (ref 98–111)
Creatinine, Ser: 0.8 mg/dL (ref 0.61–1.24)
GFR, Estimated: >60 mL/min (ref >60)
Glucose, Bld: 109 mg/dL — ABNORMAL HIGH (ref 70–99)
Potassium: 4.6 mmol/L (ref 3.5–5.1)
Sodium: 134 mmol/L — ABNORMAL LOW (ref 135–145)
Total Bilirubin: 0.9 mg/dL (ref 0.3–1.2)
Total Protein: 7.5 g/dL (ref 6.5–8.1)

## 2021-11-01 LAB — TSH: TSH: 2.983 u[IU]/mL (ref 0.350–4.500)

## 2021-11-01 LAB — CBC WITH DIFFERENTIAL/PLATELET
Abs Immature Granulocytes: 0.02 10*3/uL (ref 0.00–0.07)
Basophils Absolute: 0 10*3/uL (ref 0.0–0.1)
Basophils Relative: 0 %
Eosinophils Absolute: 0.2 10*3/uL (ref 0.0–0.5)
Eosinophils Relative: 3 %
HCT: 46.8 % (ref 39.0–52.0)
Hemoglobin: 15.4 g/dL (ref 13.0–17.0)
Immature Granulocytes: 0 %
Lymphocytes Relative: 20 %
Lymphs Abs: 1.6 10*3/uL (ref 0.7–4.0)
MCH: 29.3 pg (ref 26.0–34.0)
MCHC: 32.9 g/dL (ref 30.0–36.0)
MCV: 89.1 fL (ref 80.0–100.0)
Monocytes Absolute: 0.7 10*3/uL (ref 0.1–1.0)
Monocytes Relative: 9 %
Neutro Abs: 5.4 10*3/uL (ref 1.7–7.7)
Neutrophils Relative %: 68 %
Platelets: 195 10*3/uL (ref 150–400)
RBC: 5.25 MIL/uL (ref 4.22–5.81)
RDW: 12.8 % (ref 11.5–15.5)
WBC: 8 10*3/uL (ref 4.0–10.5)
nRBC: 0 % (ref 0.0–0.2)

## 2021-11-01 LAB — T4, FREE: Free T4: 1.11 ng/dL (ref 0.61–1.12)

## 2021-11-02 ENCOUNTER — Telehealth: Payer: Self-pay

## 2021-11-02 NOTE — Progress Notes (Signed)
Hematology/Oncology Progress note [phone] [phone]      Clinic Day:  11/03/2019  Referring physician: Rusty Aus, MD  Chief Complaint: Benjamin Torres is a 66 y.o. 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 PET scan on 08/18/2013 revealed hypermetabolic soft tissue lesion in the right hypopharynx consistent with epiglottic lesion. There also was a high right paratracheal lymph node which was also hypermetabolic and suspicious for metastatic disease.  Clinical stage was T2N1M0.   He was treated with radiation therapy and cetuximab beginning 09/07/2013.  He received 7,000 cGy from 09/07/2013 - 11/10/2013.     06/21/2015 PET scan on  revealed no evidence of recurrent disease.  02/12/2017 PET revealed no evidence of hypermetabolic locoregional or distant metastatic disease.  11/2016 ENT evaluation iwas negative per patient.   He has hypothyroidism.  He is on Synthroid.   He has a posterior bladder diverticulum and prostatomegaly, which causes him to experience LUTS. PSA was 1.91 on 01/13/2017.   EGD on 03/07/2017 revealed a medium sized hiatal hernia, moderate inflammation in the stomach, and mild inflammation in the duodenal bulb. Findings c/w gastritis and duodenitis. Pathology demonstrated chronic inactive gastritis. Negative for H.pylori.    Colonoscopy on 03/07/2017 revealed two 4-5 mm sessile polyps in the descending colon, one 2 mm polyp in the sigmoid colon, diverticulosis, and non-bleeding internal hemorrhoids. Pathology showed that the 2 polyps from the descending colon were  tubular adenomas, and the lesion from the sigmoid was a hyperplastic polyp.   April 2022 hospitalized due to pneumonia.   INTERVAL HISTORY Benjamin Torres is a 66 y.o. 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 Lung-RADS 4A, suspicious.  Left lower lobe nudule 19m, left upper lobe nodule 8.533m 10/31/21 CT lung cancer screening- 1. Lung-RADS 4A, suspicious.  left upper lobe nodule 8.40m39mnew left upper lobe nodule 7.3mm76mew 7 mm right upper lobe perifissural nodule   Patient presents to discuss results. He continues vaping.   Past Medical History:  Diagnosis Date   Cancer of larynx (HCC)[place]10/2016   COPD (chronic obstructive pulmonary disease) (HCC)    Hypothyroidism    PE (pulmonary thromboembolism) (HCC)[place] right lung, over 10 yrs ago    Past Surgical History:  Procedure Laterality Date   ANAL FISSURE REPAIR     APPENDECTOMY     BIOPSY PHARYNX  2015   Larynx BX   COLONOSCOPY WITH PROPOFOL N/A 03/07/2017   Procedure: COLONOSCOPY WITH PROPOFOL;  Surgeon: WohlLucilla Lame;  [place];  Laterality: N/A;   ESOPHAGOGASTRODUODENOSCOPY (EGD) WITH PROPOFOL N/A 03/07/2017   Procedure: ESOPHAGOGASTRODUODENOSCOPY (EGD) WITH PROPOFOL;  Surgeon: WohlLucilla Lame;  [place] [facility];  Laterality: N/A;   LEFT HEART CATH AND CORONARY ANGIOGRAPHY Left 09/23/2019   Procedure: LEFT HEART CATH AND CORONARY ANGIOGRAPHY;  Surgeon: FathTeodoro Spray;  [facility];  Service: Cardiovascular;  Laterality: Left;   POLYPECTOMY  03/07/2017  Procedure: POLYPECTOMY;  Surgeon: Lucilla Lame, MD;  Location: [place];  Service: Endoscopy;;    History reviewed. No pertinent family history.  Social History:  reports that he has been smoking e-cigarettes. He has a  60.00 pack-year smoking history. He has never used smokeless tobacco. He reports that he does not drink alcohol. No history on file for drug use.  Allergies: No Known Allergies  Current Medications: Current Outpatient Medications  Medication Sig Dispense Refill   albuterol (PROVENTIL HFA;VENTOLIN HFA) 108 (90 Base) MCG/ACT inhaler Inhale 1 puff into the lungs every 4 (four) hours as needed for wheezing or shortness of breath.     cyanocobalamin (,VITAMIN B-12,) 1000 MCG/ML injection Inject into the muscle.     levothyroxine (SYNTHROID) 75 MCG tablet Take by mouth.     losartan (COZAAR) 50 MG tablet Take 50 mg by mouth daily.     metoprolol succinate (TOPROL-XL) 25 MG 24 hr tablet Take 25 mg by mouth daily.     rosuvastatin (CRESTOR) 10 MG tablet Take 10 mg by mouth at bedtime.     tamsulosin (FLOMAX) 0.4 MG CAPS capsule Take 1 capsule (0.4 mg total) by mouth daily. 90 capsule 3   TRELEGY ELLIPTA 200-62.5-25 MCG/INH AEPB Inhale 1 puff into the lungs daily at 2 PM.      acetaminophen (TYLENOL) 500 MG tablet Take 500-1,000 mg by mouth every 6 (six) hours as needed (for pain.). (Patient not taking: Reported on 11/02/2020)     ibuprofen (ADVIL) 200 MG tablet Take 400 mg by mouth as needed for headache. (Patient not taking: Reported on 11/02/2020)     levothyroxine (SYNTHROID) 75 MCG tablet Take 1 tablet (75 mcg total) by mouth daily at 6 (six) AM. 90 tablet 0   losartan (COZAAR) 100 MG tablet Check with your primary care provider to see if you are still taking this medicine. (Patient not taking: Reported on 11/01/2021)     vitamin B-12 (CYANOCOBALAMIN) 1000 MCG tablet Take 1,000 mcg by mouth 2 (two) times a week. (Patient not taking: Reported on 11/01/2021)     No current facility-administered medications for this visit.   Facility-Administered Medications Ordered in Other Visits  Medication Dose Route Frequency Provider Last Rate Last Admin   sodium chloride flush (NS) 0.9 % injection 3 mL  3 mL  Intravenous Q12H Teodoro Spray, MD        Review of Systems  Constitutional: Negative.  Negative for chills, diaphoresis, fever, malaise/fatigue and weight loss (up 10 pound).  HENT: Negative.  Negative for congestion, ear discharge, ear pain, hearing loss, nosebleeds, sinus pain, sore throat and tinnitus.   Eyes: Negative.  Negative for blurred vision, double vision, photophobia and pain.  Respiratory:  Positive for shortness of breath. Negative for cough, hemoptysis, sputum production and wheezing.   Cardiovascular: Negative.  Negative for chest pain, palpitations, orthopnea, leg swelling and PND.  Gastrointestinal: Negative.  Negative for abdominal pain, blood in stool, constipation, diarrhea, heartburn, melena, nausea and vomiting.  Genitourinary: Negative.  Negative for dysuria, frequency, hematuria and urgency.       LUTS; difficulty initiating stream, voids small amounts, nocturia. Known prostatomegaly and posterior bladder diverticulum; sees urology  Musculoskeletal: Negative.  Negative for back pain, falls, joint pain, myalgias and neck pain.  Skin: Negative.  Negative for itching and rash.  Neurological: Negative.  Negative for dizziness, tremors, speech change, focal weakness, weakness and headaches.  Endo/Heme/Allergies: Negative.  Does not bruise/bleed easily.  Hypothyroidism on levothyroxine.  Psychiatric/Behavioral:  Negative for depression, memory loss and suicidal ideas. The patient is not nervous/anxious and does not have insomnia.   All other systems reviewed and are negative.   Performance status (ECOG): 0 Vitals:   11/01/21 1404  BP: (!) 116/91  Pulse: 93  Temp: 98.2 F (36.8 C)     Physical Exam Constitutional:      General: He is not in acute distress.    Appearance: He is not diaphoretic.  HENT:     Head: Normocephalic and atraumatic.     Nose: Nose normal.     Mouth/Throat:     Pharynx: No oropharyngeal exudate.  Eyes:     General: No scleral  icterus.    Pupils: Pupils are equal, round, and reactive to light.  Cardiovascular:     Rate and Rhythm: Normal rate and regular rhythm.     Heart sounds: No murmur heard. Pulmonary:     Effort: Pulmonary effort is normal. No respiratory distress.     Breath sounds: No rales.     Comments: Decreased breath sounds bilaterally. Chest:     Chest wall: No tenderness.  Abdominal:     General: There is no distension.     Palpations: Abdomen is soft.     Tenderness: There is no abdominal tenderness.  Musculoskeletal:        General: Normal range of motion.     Cervical back: Normal range of motion and neck supple.  Skin:    General: Skin is warm and dry.     Findings: No erythema.  Neurological:     Mental Status: He is alert and oriented to person, place, and time.     Cranial Nerves: No cranial nerve deficit.     Motor: No abnormal muscle tone.     Coordination: Coordination normal.  Psychiatric:        Mood and Affect: Affect normal.    Labs    Latest Ref Rng & Units 11/01/2021    1:53 PM 11/02/2020    1:27 PM 08/11/2020    5:02 AM  CBC  WBC 4.0 - 10.5 K/uL 8.0  7.7  9.8   Hemoglobin 13.0 - 17.0 g/dL 15.4  15.1  12.7   Hematocrit 39.0 - 52.0 % 46.8  44.9  36.8   Platelets 150 - 400 K/uL 195  217  207       Latest Ref Rng & Units 11/01/2021    1:53 PM 11/02/2020    1:27 PM 08/11/2020    5:02 AM  CMP  Glucose 70 - 99 mg/dL 109  111  102   BUN 8 - 23 mg/dL '12  12  13   '$ Creatinine 0.61 - 1.24 mg/dL 0.80  0.84  0.73   Sodium 135 - 145 mmol/L 134  134  140   Potassium 3.5 - 5.1 mmol/L 4.6  4.4  3.4   Chloride 98 - 111 mmol/L 101  103  108   CO2 22 - 32 mmol/L '27  24  24   '$ Calcium 8.9 - 10.3 mg/dL 8.7  9.1  8.6   Total Protein 6.5 - 8.1 g/dL 7.5  7.4    Total Bilirubin 0.3 - 1.2 mg/dL 0.9  0.9    Alkaline Phos 38 - 126 U/L 50  53    AST 15 - 41 U/L 22  21    ALT 0 - 44 U/L 17  16     RADIOGRAPHIC STUDIES: I  have personally reviewed the radiological images as listed and  agreed with the findings in the report.  CT CHEST LCS NODULE F/U LOW DOSE WO CONTRAST  Result Date: 10/31/2021 CLINICAL DATA:  66 year old male with 50 pack-year history of smoking. Lung cancer screening. Nodule follow-up. EXAM: CT CHEST WITHOUT CONTRAST FOR LUNG CANCER SCREENING NODULE FOLLOW-UP TECHNIQUE: Multidetector CT imaging of the chest was performed following the standard protocol without IV contrast. RADIATION DOSE REDUCTION: This exam was performed according to the departmental dose-optimization program which includes automated exposure control, adjustment of the mA and/or kV according to patient size and/or use of iterative reconstruction technique. COMPARISON:  07/30/2021 FINDINGS: Cardiovascular: The heart size is normal. No substantial pericardial effusion. Coronary artery calcification is evident. Mild atherosclerotic calcification is noted in the wall of the thoracic aorta. Mediastinum/Nodes: No mediastinal lymphadenopathy. No evidence for gross hilar lymphadenopathy although assessment is limited by the lack of intravenous contrast on the current study. The esophagus has normal imaging features. Tiny hiatal hernia evident. There is no axillary lymphadenopathy. Lungs/Pleura: Centrilobular and paraseptal emphysema evident. 8.5 mm nodule of concern in the left upper lobe is stable in the interval (image 27). Other scattered tiny bilateral pulmonary nodules are unchanged. Interval development of a new 7.3 mm anterior left upper lobe pulmonary nodule on image 64 with adjacent ground-glass opacity. There is also a new 7 mm right upper lobe perifissural nodule on image 84. No focal airspace consolidation. No pleural effusion. Upper Abdomen: Tiny calcified gallstones evident. Musculoskeletal: No worrisome lytic or sclerotic osseous abnormality. IMPRESSION: Lung-RADS 4A, suspicious. New bilateral pulmonary nodules measuring up to 7.3 mm. While potentially related to atelectasis or infection/inflammation,  these nodules technically meet 4A size criteria. Follow up low-dose chest CT without contrast in 3 months (please use the following order, "CT CHEST LCS NODULE FOLLOW-UP W/O CM") is recommended. Alternatively, PET may be considered when there is a solid component 64m or larger. Emphysema (ICD10-J43.9) and Aortic Atherosclerosis (ICD10-170.0) These results will be called to the ordering clinician or representative by the Radiologist Assistant, and communication documented in the PACS or [employer] Electronically Signed   By: EMisty StanleyM.D.   On: 10/31/2021 12:13       Assessment and plan 1. Lung nodule   2. [place]   3. Smoking history   4. Other specified hypothyroidism    # Lung nodules.  Imaging was reviewed and discussed with patient.  Obtain PET scan for further evaluation.   # Stage III supraglottic larynx cancer Clinically he is doing well.  He is 8 year post completion of treatment.  Monitor  thyroid function annually.              #  Smoking history Discuss smoking cessation.            # acquired Hypothyroidism TSH 2.9 free T4 1.1continue synthroid 732m daily.    RTC after PET scan.   I discussed the assessment and treatment plan with the patient.  The patient was provided an opportunity to ask questions and all were answered.  The patient agreed with the plan and demonstrated an understanding of the instructions.  The patient was advised to call back or seek an in person evaluation if the symptoms worsen or if the condition fails to improve as anticipated.  ZhEarlie ServerMD, PhD Hematology Oncology 11/02/2021

## 2021-11-02 NOTE — Telephone Encounter (Signed)
Patient wife called stating that she was told someone would call her about the PET scan appointment because while they was here the system was down. Patient called and informed about Scheduled PET Scan for 7/18 '@10'$ :30am.

## 2021-11-02 NOTE — Progress Notes (Incomplete)
Hematology/Oncology Progress note [phone] [phone]      Clinic Day:  11/03/2019  Referring physician: Rusty Aus, MD  Chief Complaint: Benjamin Torres is a 66 y.o. 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 PET scan on 08/18/2013 revealed hypermetabolic soft tissue lesion in the right hypopharynx consistent with epiglottic lesion. There also was a high right paratracheal lymph node which was also hypermetabolic and suspicious for metastatic disease.  Clinical stage was T2N1M0.   He was treated with radiation therapy and cetuximab beginning 09/07/2013.  He received 7,000 cGy from 09/07/2013 - 11/10/2013.     06/21/2015 PET scan on  revealed no evidence of recurrent disease.  02/12/2017 PET revealed no evidence of hypermetabolic locoregional or distant metastatic disease.  11/2016 ENT evaluation iwas negative per patient.   He has hypothyroidism.  He is on Synthroid.   He has a posterior bladder diverticulum and prostatomegaly, which causes him to experience LUTS. PSA was 1.91 on 01/13/2017.   EGD on 03/07/2017 revealed a medium sized hiatal hernia, moderate inflammation in the stomach, and mild inflammation in the duodenal bulb. Findings c/w gastritis and duodenitis. Pathology demonstrated chronic inactive gastritis. Negative for H.pylori.    Colonoscopy on 03/07/2017 revealed two 4-5 mm sessile polyps in the descending colon, one 2 mm polyp in the sigmoid colon, diverticulosis, and non-bleeding internal hemorrhoids. Pathology showed that the 2 polyps from the descending colon were  tubular adenomas, and the lesion from the sigmoid was a hyperplastic polyp.   April 2022 hospitalized due to pneumonia.   INTERVAL HISTORY Benjamin Torres is a 66 y.o. 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 Lung-RADS 4A, suspicious.  Left lower lobe nudule 19m, left upper lobe nodule 8.531m 10/31/21     Past Medical History:  Diagnosis Date  . [place] of [place] ([facility]  . COPD (ch[place] obstructive pulmonary disease) [place]  . Hypothyroidism   . PE [place]ulmonary [place]embolism) [place]   right lung, over 10 yrs ago    Past Surgical History:  Procedure Laterality Date  . ANAL FISSURE REPAIR    . APPENDECTOMY    . BIOPSY PHARYNX  2015   Larynx BX  . COLONOSCOPY WITH PROPOFOL N/A 03/07/2017   Procedure: COLONOSCOPY WITH PROPOFOL;  Surgeon: WoLucilla LameMD;  [place] [facility] Service: Endoscopy;  Laterality: N/A;  . ESOPHAGOGASTRODUODENOSCOPY (EGD) WITH PROPOFOL N/A 03/07/2017   Procedure: ESOPHAGOGASTRODUODENOSCOPY (EGD) WITH PROPOFOL;  Surgeon: WoLucilla LameMD;  [place] [facility] Service: Endoscopy;  Laterality: N/A;  . LEFT HEART CATH AND CORONARY ANGIOGRAPHY Left 09/23/2019   Procedure: LEFT HEART CATH AND CORONARY ANGIOGRAPHY;  Surgeon: FaTeodoro SprayMD;  [facility];  Service: Cardiovascular;  Laterality: Left;  . POLYPECTOMY  03/07/2017   Procedure: POLYPECTOMY;  Surgeon: WoLucilla LameMD;  [place] [facility] Service: [place];;    History reviewed. No pertinent family history.  Social History:  reports that he has been smoking e-cigarettes. He  has a 60.00 pack-year smoking history. He has never used smokeless tobacco. He reports that he does not drink alcohol. No history on file for drug use. He likes to ride motorcycles.  He likes to play soft ball.  He works  a Armed forces logistics/support/administrative officer. The patient is alone today.  Allergies: No Known Allergies  Current Medications: Current Outpatient Medications  Medication Sig Dispense Refill  . albuterol (PROVENTIL HFA;VENTOLIN HFA) 108 (90 Base) MCG/ACT inhaler Inhale 1 puff into the lungs every 4 (four) hours as needed for wheezing or shortness of breath.    . cyanocobalamin (,VITAMIN B-12,) 1000 MCG/ML injection Inject into the muscle.    . levothyroxine (SYNTHROID) 75 MCG tablet Take by mouth.    . losartan (COZAAR) 50 MG tablet Take 50 mg by mouth daily.    . metoprolol succinate (TOPROL-XL) 25 MG 24 hr tablet Take 25 mg by mouth daily.    . rosuvastatin (CRESTOR) 10 MG tablet Take 10 mg by mouth at bedtime.    . tamsulosin (FLOMAX) 0.4 MG CAPS capsule Take 1 capsule (0.4 mg total) by mouth daily. 90 capsule 3  . TRELEGY ELLIPTA 200-62.5-25 MCG/INH AEPB Inhale 1 puff into the lungs daily at 2 PM.     . acetaminophen (TYLENOL) 500 MG tablet Take 500-1,000 mg by mouth every 6 (six) hours as needed (for pain.). (Patient not taking: Reported on 11/02/2020)    . ibuprofen (ADVIL) 200 MG tablet Take 400 mg by mouth as needed for headache. (Patient not taking: Reported on 11/02/2020)    . levothyroxine (SYNTHROID) 75 MCG tablet Take 1 tablet (75 mcg total) by mouth daily at 6 (six) AM. 90 tablet 0  . losartan (COZAAR) 100 MG tablet Check with your primary care provider to see if you are still taking this medicine. (Patient not taking: Reported on 11/01/2021)    . vitamin B-12 (CYANOCOBALAMIN) 1000 MCG tablet Take 1,000 mcg by mouth 2 (two) times a week. (Patient not taking: Reported on 11/01/2021)     No current facility-administered medications for this visit.   Facility-Administered Medications Ordered in Other Visits  Medication Dose Route Frequency Provider Last Rate Last Admin  . sodium chloride flush (NS) 0.9 % injection 3 mL  3 mL Intravenous Q12H Teodoro Spray, MD        Review of Systems  Constitutional: Negative.   Negative for chills, diaphoresis, fever, malaise/fatigue and weight loss (up 10 pound).  HENT: Negative.  Negative for congestion, ear discharge, ear pain, hearing loss, nosebleeds, sinus pain, sore throat and tinnitus.   Eyes: Negative.  Negative for blurred vision, double vision, photophobia and pain.  Respiratory:  Positive for shortness of breath. Negative for cough, hemoptysis, sputum production and wheezing.   Cardiovascular: Negative.  Negative for chest pain, palpitations, orthopnea, leg swelling and PND.  Gastrointestinal: Negative.  Negative for abdominal pain, blood in stool, constipation, diarrhea, heartburn, melena, nausea and vomiting.  Genitourinary: Negative.  Negative for dysuria, frequency, hematuria and urgency.       LUTS; difficulty initiating stream, voids small amounts, nocturia. Known prostatomegaly and posterior bladder diverticulum; sees urology  Musculoskeletal: Negative.  Negative for back pain, falls, joint pain, myalgias and neck pain.  Skin: Negative.  Negative for itching and rash.  Neurological: Negative.  Negative for dizziness, tremors, speech change, focal weakness, weakness and headaches.  Endo/Heme/Allergies: Negative.  Does not bruise/bleed easily.       HYPOthyroidism on levothyroxine.  Psychiatric/Behavioral:  Negative for depression, memory loss and suicidal  ideas. The patient is not nervous/anxious and does not have insomnia.   All other systems reviewed and are negative.   Performance status (ECOG): 0 Vitals:   11/01/21 1404  BP: (!) 116/91  Pulse: 93  Temp: 98.2 F (36.8 C)     Physical Exam Vitals and nursing note reviewed.  Constitutional:      General: He is not in acute distress.    Appearance: He is well-developed.  HENT:     Head: Normocephalic and atraumatic.     Mouth/Throat:     Mouth: Mucous membranes are moist.     Pharynx: Oropharynx is clear.  Eyes:     General: No scleral icterus.    Pupils: Pupils are equal, round, and  reactive to light.  Cardiovascular:     Rate and Rhythm: Normal rate and regular rhythm.     Heart sounds: Normal heart sounds. No murmur heard. Pulmonary:     Effort: Pulmonary effort is normal. No respiratory distress.     Breath sounds: Normal breath sounds. No wheezing or rales.  Chest:     Chest wall: No tenderness.  Abdominal:     General: Bowel sounds are normal. There is no distension.     Palpations: Abdomen is soft. There is no mass.     Tenderness: There is no abdominal tenderness. There is no guarding or rebound.  Musculoskeletal:        General: No swelling or tenderness. Normal range of motion.     Cervical back: Normal range of motion and neck supple.  Lymphadenopathy:     Head:     Right side of head: No preauricular, posterior auricular or occipital adenopathy.     Left side of head: No preauricular, posterior auricular or occipital adenopathy.     Cervical: No cervical adenopathy.     Upper Body:     Right upper body: No supraclavicular adenopathy.     Left upper body: No supraclavicular adenopathy.     Lower Body: No right inguinal adenopathy. No left inguinal adenopathy.  Skin:    General: Skin is warm and dry.  Neurological:     Mental Status: He is alert and oriented to person, place, and time.  Psychiatric:        Behavior: Behavior normal.        Thought Content: Thought content normal.        Judgment: Judgment normal.      Appointment on 11/01/2021  Component Date Value Ref Range Status  . TSH 11/01/2021 2.983  0.350 - 4.500 uIU/mL Final   Comment: Performed by a 3rd Generation assay with a functional sensitivity of <=0.01 uIU/mL. Performed at [facility], [address], [place], [place] [address]   . Free T4 11/01/2021 1.11  0.61 - 1.12 ng/dL Final   Comment: (NOTE) Biotin ingestion may interfere with free T4 tests. If the results are inconsistent with the TSH level, previous test results, or the clinical presentation, then consider  biotin interference. If needed, order repeat testing after stopping biotin. Performed at [facility], [address] [place], [place] [address]   . Sodium 11/01/2021 134 (L)  135 - 145 mmol/L Final  . Potassium 11/01/2021 4.6  3.5 - 5.1 mmol/L Final  . Chloride 11/01/2021 101  98 - 111 mmol/L Final  . CO2 11/01/2021 27  22 - 32 mmol/L Final  . Glucose, Bld 11/01/2021 109 (H)  70 - 99 mg/dL Final   Glucose reference range applies  only to samples taken after fasting for at least 8 hours.  . BUN 11/01/2021 12  8 - 23 mg/dL Final  . Creatinine, Ser 11/01/2021 0.80  0.61 - 1.24 mg/dL Final  . Calcium 11/01/2021 8.7 (L)  8.9 - 10.3 mg/dL Final  . Total Protein 11/01/2021 7.5  6.5 - 8.1 g/dL Final  . Albumin 11/01/2021 4.2  3.5 - 5.0 g/dL Final  . AST 11/01/2021 22  15 - 41 U/L Final  . ALT 11/01/2021 17  0 - 44 U/L Final  . Alkaline Phosphatase 11/01/2021 50  38 - 126 U/L Final  . Total Bilirubin 11/01/2021 0.9  0.3 - 1.2 mg/dL Final  . GFR, Estimated 11/01/2021 >60  >60 mL/min Final   Comment: (NOTE) Calculated using the CKD-EPI Creatinine Equation (2021)   . Anion gap 11/01/2021 6  5 - 15 Final   Performed at [facility], [address] [place], [place] [address]  . WBC 11/01/2021 8.0  4.0 - 10.5 K/uL Final  . RBC 11/01/2021 5.25  4.22 - 5.81 MIL/uL Final  . Hemoglobin 11/01/2021 15.4  13.0 - 17.0 g/dL Final  . HCT 11/01/2021 46.8  39.0 - 52.0 % Final  . MCV 11/01/2021 89.1  80.0 - 100.0 fL Final  . MCH 11/01/2021 29.3  26.0 - 34.0 pg Final  . MCHC 11/01/2021 32.9  30.0 - 36.0 g/dL Final  . RDW 11/01/2021 12.8  11.5 - 15.5 % Final  . Platelets 11/01/2021 195  150 - 400 K/uL Final  . nRBC 11/01/2021 0.0  0.0 - 0.2 % Final  . Neutrophils Relative % 11/01/2021 68  % Final  . Neutro Abs 11/01/2021 5.4  1.7 - 7.7 K/uL Final  . Lymphocytes Relative 11/01/2021 20  % Final  . Lymphs Abs 11/01/2021 1.6  0.7 - 4.0 K/uL Final  . Monocytes Relative 11/01/2021 9  % Final   . Monocytes Absolute 11/01/2021 0.7  0.1 - 1.0 K/uL Final  . Eosinophils Relative 11/01/2021 3  % Final  . Eosinophils Absolute 11/01/2021 0.2  0.0 - 0.5 K/uL Final  . Basophils Relative 11/01/2021 0  % Final  . Basophils Absolute 11/01/2021 0.0  0.0 - 0.1 K/uL Final  . Immature Granulocytes 11/01/2021 0  % Final  . Abs Immature Granulocytes 11/01/2021 0.02  0.00 - 0.07 K/uL Final   Performed at [facility], [address], [place], [place] [address]    Assessment and plan 1. Lung nodule    # Stage III supraglottic larynx cancer Clinically he is doing well.  Exam showed no palpable neck lymphadenopathy.  He is 7 year post completion of treatment.  Image will be obtained if clinically indicated.  Patient wishes to conitnue follow up annualy.  Check thyroid function annually.              #  Smoking history Obtain CT chest lung cancer screening in April 2023           # acquired Hypothyroidism TSH 2.205, free T4 1.23 continue synthroid 75mg daily.   5.   RTC in 1 year for MD assessment and labs (CBC with diff, CMP, TSH, free T4).   I discussed the assessment and treatment plan with the patient.  The patient was provided an opportunity to ask questions and all were answered.  The patient agreed with the plan and demonstrated an understanding of the instructions.  The patient was advised to call back or seek an in person evaluation if the  symptoms worsen or if the condition fails to improve as anticipated.  Earlie Server, MD, PhD Hematology Oncology [place] at [facility] 11/02/2021

## 2021-11-02 NOTE — Telephone Encounter (Signed)
Error

## 2021-11-13 ENCOUNTER — Ambulatory Visit
Admission: RE | Admit: 2021-11-13 | Discharge: 2021-11-13 | Disposition: A | Discharge status: Home / Self Care | Payer: Medicare HMO | Source: Ambulatory Visit | Attending: Oncology | Admitting: Oncology

## 2021-11-13 DIAGNOSIS — I700 Atherosclerosis of aorta: Secondary | ICD-10-CM | POA: Diagnosis not present

## 2021-11-13 DIAGNOSIS — K8020 Calculus of gallbladder without cholecystitis without obstruction: Secondary | ICD-10-CM | POA: Diagnosis not present

## 2021-11-13 DIAGNOSIS — K5730 Diverticulosis of large intestine without perforation or abscess without bleeding: Secondary | ICD-10-CM | POA: Diagnosis not present

## 2021-11-13 DIAGNOSIS — R911 Solitary pulmonary nodule: Secondary | ICD-10-CM | POA: Diagnosis not present

## 2021-11-13 DIAGNOSIS — R918 Other nonspecific abnormal finding of lung field: Secondary | ICD-10-CM | POA: Insufficient documentation

## 2021-11-13 LAB — GLUCOSE, CAPILLARY: Glucose-Capillary: 110 mg/dL — ABNORMAL HIGH (ref 70–99)

## 2021-11-13 MED ORDER — FLUDEOXYGLUCOSE F - 18 (FDG) INJECTION
8.3000 | Freq: Once | INTRAVENOUS | Status: AC | PRN
  Administered 2021-11-13: 8.95 via INTRAVENOUS

## 2021-11-20 ENCOUNTER — Inpatient Hospital Stay: Payer: Medicare HMO | Admitting: Oncology

## 2021-11-20 ENCOUNTER — Encounter: Payer: Self-pay | Admitting: Oncology

## 2021-11-20 VITALS — BP 125/90 | HR 66 | Temp 96.9°F | Ht 67.0 in | Wt 157.0 lb

## 2021-11-20 DIAGNOSIS — E038 Other specified hypothyroidism: Secondary | ICD-10-CM

## 2021-11-20 DIAGNOSIS — Z87891 Personal history of nicotine dependence: Secondary | ICD-10-CM | POA: Diagnosis not present

## 2021-11-20 DIAGNOSIS — R911 Solitary pulmonary nodule: Secondary | ICD-10-CM | POA: Insufficient documentation

## 2021-11-20 DIAGNOSIS — R948 Abnormal results of function studies of other organs and systems: Secondary | ICD-10-CM | POA: Diagnosis not present

## 2021-11-20 DIAGNOSIS — Z8521 Personal history of malignant neoplasm of larynx: Secondary | ICD-10-CM | POA: Diagnosis not present

## 2021-11-20 DIAGNOSIS — R918 Other nonspecific abnormal finding of lung field: Secondary | ICD-10-CM | POA: Diagnosis not present

## 2021-11-20 DIAGNOSIS — F17290 Nicotine dependence, other tobacco product, uncomplicated: Secondary | ICD-10-CM | POA: Diagnosis not present

## 2021-11-20 DIAGNOSIS — Z79899 Other long term (current) drug therapy: Secondary | ICD-10-CM | POA: Diagnosis not present

## 2021-11-20 DIAGNOSIS — C329 Malignant neoplasm of larynx, unspecified: Secondary | ICD-10-CM | POA: Diagnosis not present

## 2021-11-20 NOTE — Progress Notes (Signed)
Hematology/Oncology Progress note [phone] [phone]      Clinic Day:  11/03/2019  Referring physician: Rusty Aus, MD  Chief Complaint: Benjamin Torres is a 66 y.o. 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 PET scan on 08/18/2013 revealed hypermetabolic soft tissue lesion in the right hypopharynx consistent with epiglottic lesion. There also was a high right paratracheal lymph node which was also hypermetabolic and suspicious for metastatic disease.  Clinical stage was T2N1M0.   He was treated with radiation therapy and cetuximab beginning 09/07/2013.  He received 7,000 cGy from 09/07/2013 - 11/10/2013.     06/21/2015 PET scan on  revealed no evidence of recurrent disease.  02/12/2017 PET revealed no evidence of hypermetabolic locoregional or distant metastatic disease.  11/2016 ENT evaluation iwas negative per patient.   He has hypothyroidism.  He is on Synthroid.   He has a posterior bladder diverticulum and prostatomegaly, which causes him to experience LUTS. PSA was 1.91 on 01/13/2017.   EGD on 03/07/2017 revealed a medium sized hiatal hernia, moderate inflammation in the stomach, and mild inflammation in the duodenal bulb. Findings c/w gastritis and duodenitis. Pathology demonstrated chronic inactive gastritis. Negative for H.pylori.    Colonoscopy on 03/07/2017 revealed two 4-5 mm sessile polyps in the descending colon, one 2 mm polyp in the sigmoid colon, diverticulosis, and non-bleeding internal hemorrhoids. Pathology showed that the 2 polyps from the descending colon were  tubular adenomas, and the lesion from the sigmoid was a hyperplastic polyp.   April 2022 hospitalized due to pneumonia.   07/31/21 CT lung cancer screening- 1. Lung-RADS 4A, suspicious.  Left lower lobe nudule 79m, left upper lobe nodule 8.51m 10/31/21 CT lung cancer screening- 1. Lung-RADS 4A, suspicious.  left upper lobe nodule 8.85m79mnew left upper lobe nodule 7.3mm50mew 7 mm right upper lobe perifissural nodule   INTERVAL HISTORY Benjamin Torres 66 y52. 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  He has no new complaints. Patient presents to discuss results. He continues vaping.   Past Medical History:  Diagnosis Date   Cancer of larynx (HCC)[place]10/2016   COPD (chronic obstructive pulmonary disease) (HCC)    Hypothyroidism    PE (pulmonary thromboembolism) (HCC)[place] right lung, over 10 yrs ago    Past Surgical History:  Procedure Laterality Date   ANAL FISSURE REPAIR     APPENDECTOMY     BIOPSY PHARYNX  2015   Larynx BX   COLONOSCOPY WITH PROPOFOL N/A 03/07/2017   Procedure: COLONOSCOPY WITH PROPOFOL;  Surgeon: WohlLucilla Lame;  [facility];  Laterality: N/A;   ESOPHAGOGASTRODUODENOSCOPY (EGD) WITH PROPOFOL N/A 03/07/2017   Procedure: ESOPHAGOGASTRODUODENOSCOPY (EGD) WITH PROPOFOL;  Surgeon: WohlLucilla Lame;  [facility];  Laterality: N/A;   LEFT HEART CATH AND CORONARY ANGIOGRAPHY Left 09/23/2019   Procedure: LEFT HEART CATH AND CORONARY ANGIOGRAPHY;  Surgeon: FathTeodoro Spray;  [facility];  Service: Cardiovascular;  Laterality: Left;   POLYPECTOMY  03/07/2017   Procedure: POLYPECTOMY;  Surgeon: Lucilla Lame, MD;  Location: [place];  Service: Endoscopy;;    History reviewed. No pertinent family  history.  Social History:  reports that he has been smoking e-cigarettes. He has a 60.00 pack-year smoking history. He has never used smokeless tobacco. He reports that he does not drink alcohol. No history on file for drug use.  Allergies: No Known Allergies  Current Medications: Current Outpatient Medications  Medication Sig Dispense Refill   albuterol (PROVENTIL HFA;VENTOLIN HFA) 108 (90 Base) MCG/ACT inhaler Inhale 1 puff into the lungs every 4 (four) hours as needed for wheezing or shortness of breath.     cyanocobalamin (,VITAMIN B-12,) 1000 MCG/ML injection Inject into the muscle.     levothyroxine (SYNTHROID) 75 MCG tablet Take by mouth.     losartan (COZAAR) 50 MG tablet Take 50 mg by mouth daily.     metoprolol succinate (TOPROL-XL) 25 MG 24 hr tablet Take 25 mg by mouth daily.     rosuvastatin (CRESTOR) 10 MG tablet Take 10 mg by mouth at bedtime.     tamsulosin (FLOMAX) 0.4 MG CAPS capsule Take 1 capsule (0.4 mg total) by mouth daily. 90 capsule 3   TRELEGY ELLIPTA 200-62.5-25 MCG/INH AEPB Inhale 1 puff into the lungs daily at 2 PM.      acetaminophen (TYLENOL) 500 MG tablet Take 500-1,000 mg by mouth every 6 (six) hours as needed (for pain.). (Patient not taking: Reported on 11/02/2020)     ibuprofen (ADVIL) 200 MG tablet Take 400 mg by mouth as needed for headache. (Patient not taking: Reported on 11/02/2020)     levothyroxine (SYNTHROID) 75 MCG tablet Take 1 tablet (75 mcg total) by mouth daily at 6 (six) AM. 90 tablet 0   losartan (COZAAR) 100 MG tablet Check with your primary care provider to see if you are still taking this medicine. (Patient not taking: Reported on 11/01/2021)     vitamin B-12 (CYANOCOBALAMIN) 1000 MCG tablet Take 1,000 mcg by mouth 2 (two) times a week. (Patient not taking: Reported on 11/01/2021)     No current facility-administered medications for this visit.   Facility-Administered Medications Ordered in Other Visits  Medication Dose Route Frequency Provider  Last Rate Last Admin   sodium chloride flush (NS) 0.9 % injection 3 mL  3 mL Intravenous Q12H Teodoro Spray, MD        Review of Systems  Constitutional: Negative.  Negative for chills, diaphoresis, fever, malaise/fatigue and weight loss (up 10 pound).  HENT: Negative.  Negative for congestion, ear discharge, ear pain, hearing loss, nosebleeds, sinus pain, sore throat and tinnitus.   Eyes: Negative.  Negative for blurred vision, double vision, photophobia and pain.  Respiratory:  Positive for shortness of breath. Negative for cough, hemoptysis, sputum production and wheezing.   Cardiovascular: Negative.  Negative for chest pain, palpitations, orthopnea, leg swelling and PND.  Gastrointestinal: Negative.  Negative for abdominal pain, blood in stool, constipation, diarrhea, heartburn, melena, nausea and vomiting.  Genitourinary: Negative.  Negative for dysuria, frequency, hematuria and urgency.       LUTS; difficulty initiating stream, voids small amounts, nocturia. Known prostatomegaly and posterior bladder diverticulum; sees urology  Musculoskeletal: Negative.  Negative for back pain, falls, joint pain, myalgias and neck pain.  Skin: Negative.  Negative for itching and rash.  Neurological: Negative.  Negative for dizziness, tremors, speech change, focal weakness, weakness and  headaches.  Endo/Heme/Allergies: Negative.  Does not bruise/bleed easily.       Hypothyroidism on levothyroxine.  Psychiatric/Behavioral:  Negative for depression, memory loss and suicidal ideas. The patient is not nervous/anxious and does not have insomnia.   All other systems reviewed and are negative.   Performance status (ECOG): 0 Vitals:   11/20/21 1302  BP: 125/90  Pulse: 66  Temp: (!) 96.9 F (36.1 C)  SpO2: 99%     Physical Exam Constitutional:      General: He is not in acute distress.    Appearance: He is not diaphoretic.  HENT:     Head: Normocephalic and atraumatic.     Nose: Nose normal.      Mouth/Throat:     Pharynx: No oropharyngeal exudate.  Eyes:     General: No scleral icterus.    Pupils: Pupils are equal, round, and reactive to light.  Cardiovascular:     Rate and Rhythm: Normal rate and regular rhythm.     Heart sounds: No murmur heard. Pulmonary:     Effort: Pulmonary effort is normal. No respiratory distress.     Breath sounds: No rales.     Comments: Decreased breath sounds bilaterally. Chest:     Chest wall: No tenderness.  Abdominal:     General: There is no distension.     Palpations: Abdomen is soft.     Tenderness: There is no abdominal tenderness.  Musculoskeletal:        General: Normal range of motion.     Cervical back: Normal range of motion and neck supple.  Skin:    General: Skin is warm and dry.     Findings: No erythema.  Neurological:     Mental Status: He is alert and oriented to person, place, and time.     Cranial Nerves: No cranial nerve deficit.     Motor: No abnormal muscle tone.     Coordination: Coordination normal.  Psychiatric:        Mood and Affect: Affect normal.    Labs    Latest Ref Rng & Units 11/01/2021    1:53 PM 11/02/2020    1:27 PM 08/11/2020    5:02 AM  CBC  WBC 4.0 - 10.5 K/uL 8.0  7.7  9.8   Hemoglobin 13.0 - 17.0 g/dL 15.4  15.1  12.7   Hematocrit 39.0 - 52.0 % 46.8  44.9  36.8   Platelets 150 - 400 K/uL 195  217  207       Latest Ref Rng & Units 11/01/2021    1:53 PM 11/02/2020    1:27 PM 08/11/2020    5:02 AM  CMP  Glucose 70 - 99 mg/dL 109  111  102   BUN 8 - 23 mg/dL '12  12  13   '$ Creatinine 0.61 - 1.24 mg/dL 0.80  0.84  0.73   Sodium 135 - 145 mmol/L 134  134  140   Potassium 3.5 - 5.1 mmol/L 4.6  4.4  3.4   Chloride 98 - 111 mmol/L 101  103  108   CO2 22 - 32 mmol/L '27  24  24   '$ Calcium 8.9 - 10.3 mg/dL 8.7  9.1  8.6   Total Protein 6.5 - 8.1 g/dL 7.5  7.4    Total Bilirubin 0.3 - 1.2 mg/dL 0.9  0.9    Alkaline Phos 38 - 126 U/L 50  53    AST 15 - 41 U/L 22  21  ALT 0 - 44 U/L 17  16      RADIOGRAPHIC STUDIES: I have personally reviewed the radiological images as listed and agreed with the findings in the report.  NM PET Image Restage (PS) Skull Base to Thigh (F-18 FDG)  Result Date: 11/14/2021 CLINICAL DATA:  Initial treatment strategy for pulmonary nodule. EXAM: NUCLEAR MEDICINE PET SKULL BASE TO THIGH TECHNIQUE: 8.95 mCi F-18 FDG was injected intravenously. Full-ring PET imaging was performed from the skull base to thigh after the radiotracer. CT data was obtained and used for attenuation correction and anatomic localization. Fasting blood glucose: 110 mg/dl COMPARISON:  Chest CT October 29, 2021 and PET-CT February 12, 2017 FINDINGS: Mediastinal blood pool activity: SUV max 1.6 Liver activity: SUV max NA NECK: No hypermetabolic cervical adenopathy. Incidental CT findings: none CHEST: No hypermetabolic thoracic adenopathy. No hypermetabolic pulmonary nodules or masses. Within limitations of motion degraded examination the bilateral pulmonary nodules measuring up to 8.5 mm on image 65/2 are stable and do not demonstrate abnormal FDG avidity. No new suspicious pulmonary nodules or masses Incidental CT findings: Aortic atherosclerosis and coronary artery calcifications. ABDOMEN/PELVIS: No abnormal hypermetabolic activity within the liver, pancreas, adrenal glands, or spleen. No hypermetabolic lymph nodes in the abdomen or pelvis. Focus of slightly increased asymmetric hypermetabolic activity at the anus with a max SUV of 9.2. Incidental CT findings: Cholelithiasis without findings of acute cholecystitis. Aortic atherosclerosis. Colonic diverticulosis without findings of acute diverticulitis. SKELETON: No focal hypermetabolic activity to suggest skeletal metastasis. Incidental CT findings: Multilevel degenerative changes spine IMPRESSION: 1. No hypermetabolic pulmonary nodules, although the majority of the pulmonary nodules are just at or below the resolution of PET-CT. Consider attention on close  interval follow-up chest CT in 3 months to assess stability. 2. Focus of asymmetric slightly increased hypermetabolic activity of the anal verge, nonspecific consider correlation with direct visualization. 3. Cholelithiasis without findings of acute cholecystitis. 4. Colonic diverticulosis without findings of acute diverticulitis. 5.  Aortic Atherosclerosis (ICD10-I70.0). Electronically Signed   By: Dahlia Bailiff M.D.   On: 11/14/2021 15:32   CT CHEST LCS NODULE F/U LOW DOSE WO CONTRAST  Result Date: 10/31/2021 CLINICAL DATA:  67 year old male with 50 pack-year history of smoking. Lung cancer screening. Nodule follow-up. EXAM: CT CHEST WITHOUT CONTRAST FOR LUNG CANCER SCREENING NODULE FOLLOW-UP TECHNIQUE: Multidetector CT imaging of the chest was performed following the standard protocol without IV contrast. RADIATION DOSE REDUCTION: This exam was performed according to the departmental dose-optimization program which includes automated exposure control, adjustment of the mA and/or kV according to patient size and/or use of iterative reconstruction technique. COMPARISON:  07/30/2021 FINDINGS: Cardiovascular: The heart size is normal. No substantial pericardial effusion. Coronary artery calcification is evident. Mild atherosclerotic calcification is noted in the wall of the thoracic aorta. Mediastinum/Nodes: No mediastinal lymphadenopathy. No evidence for gross hilar lymphadenopathy although assessment is limited by the lack of intravenous contrast on the current study. The esophagus has normal imaging features. Tiny hiatal hernia evident. There is no axillary lymphadenopathy. Lungs/Pleura: Centrilobular and paraseptal emphysema evident. 8.5 mm nodule of concern in the left upper lobe is stable in the interval (image 27). Other scattered tiny bilateral pulmonary nodules are unchanged. Interval development of a new 7.3 mm anterior left upper lobe pulmonary nodule on image 64 with adjacent ground-glass opacity.  There is also a new 7 mm right upper lobe perifissural nodule on image 84. No focal airspace consolidation. No pleural effusion. Upper Abdomen: Tiny calcified gallstones evident. Musculoskeletal: No worrisome lytic or  sclerotic osseous abnormality. IMPRESSION: Lung-RADS 4A, suspicious. New bilateral pulmonary nodules measuring up to 7.3 mm. While potentially related to atelectasis or infection/inflammation, these nodules technically meet 4A size criteria. Follow up low-dose chest CT without contrast in 3 months (please use the following order, "CT CHEST LCS NODULE FOLLOW-UP W/O CM") is recommended. Alternatively, PET may be considered when there is a solid component 75m or larger. Emphysema (ICD10-J43.9) and Aortic Atherosclerosis (ICD10-170.0) These results will be called to the ordering clinician or representative by the Radiologist Assistant, and communication documented in the PACS or [employer] Electronically Signed   By: EMisty StanleyM.D.   On: 10/31/2021 12:13       Assessment and plan 1. Cancer of larynx (HCC)   2. Lung nodule   3. Abnormal gastrointestinal PET scan   4. Smoking history   5. Other specified hypothyroidism    # Lung nodules.  Imaging was reviewed and discussed with patient.  11/14/2021, PET scan showed no hypermetabolic pulmonary nodules, majority of the nodules are just at or below the resolution of PET scan.  Consider attention on follow-up with CT chest in 3 months.   #Focus of asymmetric slightly increased hypermetabolic activity of anal verge.  Recommend patient to see gastroenterology for further evaluation/direct visualization.  # Stage III supraglottic larynx cancer Clinically he is doing well.  He is 8 year post completion of treatment.  Monitor  thyroid function annually.  No signs of recurrence on PET scan.              #  Smoking history Discuss smoking cessation.            # acquired Hypothyroidism TSH 2.9 free T4 1.1 continue synthroid 779m  daily.   #Follow-up in 3 months with repeat CT scan of the chest.  I discussed the assessment and treatment plan with the patient.  The patient was provided an opportunity to ask questions and all were answered.  The patient agreed with the plan and demonstrated an understanding of the instructions.  The patient was advised to call back or seek an in person evaluation if the symptoms worsen or if the condition fails to improve as anticipated.  ZhEarlie ServerMD, PhD Hematology Oncology 11/20/2021

## 2021-12-05 DIAGNOSIS — R948 Abnormal results of function studies of other organs and systems: Secondary | ICD-10-CM | POA: Diagnosis not present

## 2021-12-27 ENCOUNTER — Other Ambulatory Visit
Admission: RE | Admit: 2021-12-27 | Discharge: 2021-12-27 | Disposition: A | Discharge status: Home / Self Care | Payer: Medicare HMO | Source: Ambulatory Visit | Attending: Family Medicine | Admitting: Family Medicine

## 2021-12-27 ENCOUNTER — Ambulatory Visit
Admission: RE | Admit: 2021-12-27 | Discharge: 2021-12-27 | Disposition: A | Discharge status: Home / Self Care | Payer: Medicare HMO | Source: Ambulatory Visit | Attending: Family Medicine | Admitting: Family Medicine

## 2021-12-27 ENCOUNTER — Other Ambulatory Visit: Payer: Self-pay | Admitting: Family Medicine

## 2021-12-27 DIAGNOSIS — R079 Chest pain, unspecified: Secondary | ICD-10-CM | POA: Insufficient documentation

## 2021-12-27 DIAGNOSIS — M546 Pain in thoracic spine: Secondary | ICD-10-CM | POA: Diagnosis not present

## 2021-12-27 DIAGNOSIS — Z8521 Personal history of malignant neoplasm of larynx: Secondary | ICD-10-CM | POA: Diagnosis not present

## 2021-12-27 DIAGNOSIS — R911 Solitary pulmonary nodule: Secondary | ICD-10-CM | POA: Diagnosis not present

## 2021-12-27 DIAGNOSIS — R0602 Shortness of breath: Secondary | ICD-10-CM

## 2021-12-27 LAB — D-DIMER, QUANTITATIVE: D-Dimer, Quant: 1.39 ug/mL-FEU — ABNORMAL HIGH (ref 0.00–0.50)

## 2021-12-27 LAB — POCT I-STAT CREATININE: Creatinine, Ser: 0.9 mg/dL (ref 0.61–1.24)

## 2021-12-27 MED ORDER — IOHEXOL 350 MG/ML SOLN
75.0000 mL | Freq: Once | INTRAVENOUS | Status: AC | PRN
  Administered 2021-12-27: 75 mL via INTRAVENOUS

## 2022-01-01 DIAGNOSIS — J449 Chronic obstructive pulmonary disease, unspecified: Secondary | ICD-10-CM | POA: Diagnosis not present

## 2022-01-01 DIAGNOSIS — B349 Viral infection, unspecified: Secondary | ICD-10-CM | POA: Diagnosis not present

## 2022-01-01 DIAGNOSIS — R911 Solitary pulmonary nodule: Secondary | ICD-10-CM | POA: Diagnosis not present

## 2022-01-28 DIAGNOSIS — E538 Deficiency of other specified B group vitamins: Secondary | ICD-10-CM | POA: Diagnosis not present

## 2022-01-28 DIAGNOSIS — E782 Mixed hyperlipidemia: Secondary | ICD-10-CM | POA: Diagnosis not present

## 2022-02-04 DIAGNOSIS — D369 Benign neoplasm, unspecified site: Secondary | ICD-10-CM | POA: Diagnosis not present

## 2022-02-04 DIAGNOSIS — Z125 Encounter for screening for malignant neoplasm of prostate: Secondary | ICD-10-CM | POA: Diagnosis not present

## 2022-02-04 DIAGNOSIS — J449 Chronic obstructive pulmonary disease, unspecified: Secondary | ICD-10-CM | POA: Diagnosis not present

## 2022-02-04 DIAGNOSIS — Z87891 Personal history of nicotine dependence: Secondary | ICD-10-CM | POA: Diagnosis not present

## 2022-02-15 ENCOUNTER — Ambulatory Visit
Admission: RE | Admit: 2022-02-15 | Discharge: 2022-02-15 | Disposition: A | Discharge status: Home / Self Care | Payer: Medicare HMO | Source: Ambulatory Visit | Attending: Oncology | Admitting: Oncology

## 2022-02-15 ENCOUNTER — Other Ambulatory Visit: Payer: Self-pay | Admitting: Oncology

## 2022-02-15 DIAGNOSIS — R918 Other nonspecific abnormal finding of lung field: Secondary | ICD-10-CM | POA: Diagnosis not present

## 2022-02-15 DIAGNOSIS — R911 Solitary pulmonary nodule: Secondary | ICD-10-CM | POA: Diagnosis not present

## 2022-02-15 DIAGNOSIS — J432 Centrilobular emphysema: Secondary | ICD-10-CM | POA: Diagnosis not present

## 2022-02-20 ENCOUNTER — Encounter: Payer: Self-pay | Admitting: Oncology

## 2022-02-20 ENCOUNTER — Inpatient Hospital Stay: Payer: Medicare HMO | Attending: Oncology | Admitting: Oncology

## 2022-02-20 VITALS — BP 99/78 | HR 70 | Temp 97.7°F | Resp 18 | Wt 157.5 lb

## 2022-02-20 DIAGNOSIS — R911 Solitary pulmonary nodule: Secondary | ICD-10-CM

## 2022-02-20 DIAGNOSIS — C321 Malignant neoplasm of supraglottis: Secondary | ICD-10-CM | POA: Diagnosis not present

## 2022-02-20 DIAGNOSIS — C329 Malignant neoplasm of larynx, unspecified: Secondary | ICD-10-CM

## 2022-02-20 DIAGNOSIS — Z720 Tobacco use: Secondary | ICD-10-CM | POA: Insufficient documentation

## 2022-02-20 DIAGNOSIS — R918 Other nonspecific abnormal finding of lung field: Secondary | ICD-10-CM | POA: Insufficient documentation

## 2022-02-20 DIAGNOSIS — F17290 Nicotine dependence, other tobacco product, uncomplicated: Secondary | ICD-10-CM | POA: Diagnosis not present

## 2022-02-20 NOTE — Assessment & Plan Note (Signed)
Discussed about smoke cessation.  Patient is not interested. 

## 2022-02-20 NOTE — Assessment & Plan Note (Signed)
#   Stage III supraglottic larynx cancer Clinically he is doing well.  He is 8 year post completion of treatment.  Monitor  thyroid function annually.  No signs of recurrence on PET scan.

## 2022-02-20 NOTE — Progress Notes (Signed)
Hematology/Oncology Progress note [phone] [phone]      Clinic Day:  11/03/2019  ASSESSMENT & PLAN:   Cancer of larynx ([place]) # Stage III supraglottic larynx cancer Clinically he is doing well.  He is 8 year post completion of treatment.  Monitor  thyroid function annually.  No signs of recurrence on PET scan.  Lung nodule # Lung nodules.  Imaging was reviewed and discussed with patient.  11/14/2021, PET scan showed no hypermetabolic pulmonary nodules, majority of the nodules are just at or below the resolution of PET scan.   02/18/2022, CT scan showed interval development of several new lung nodules and slight increase of previously seen nodules.  Unclear etiology.  Nodules are very small and likely will PET scan detectable sensitivity. Consider attention on follow-up with CT chest in 3 months.   Tobacco use Discussed about smoke cessation.  Patient is not interested.   Orders Placed This Encounter  Procedures   CT Chest Wo Contrast    Standing Status:   Future    Standing Expiration Date:   02/20/2023    Order Specific Question:   Preferred imaging location?    Answer:   [facility]   Follow-up in 3 months after repeat CT scan. All questions were answered. The patient knows to call the clinic with any problems, questions or concerns.  Earlie Server, MD, PhD [facility] Hematology Oncology 02/20/2022      Chief Complaint: Benjamin Torres is a 66 y.o. 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 PET scan on 08/18/2013 revealed hypermetabolic soft tissue lesion in the right  hypopharynx consistent with epiglottic lesion. There also was a high right paratracheal lymph node which was also hypermetabolic and suspicious for metastatic disease.  Clinical stage was T2N1M0.   He was treated with radiation therapy and cetuximab beginning 09/07/2013.  He received 7,000 cGy from 09/07/2013 - 11/10/2013.     06/21/2015 PET scan on  revealed no evidence of recurrent disease.  02/12/2017 PET revealed no evidence of hypermetabolic locoregional or distant metastatic disease.  11/2016 ENT evaluation iwas negative per patient.   He has hypothyroidism.  He is on Synthroid.   He has a posterior bladder diverticulum and prostatomegaly, which causes him to experience LUTS. PSA was 1.91 on 01/13/2017.   EGD on 03/07/2017 revealed a medium sized hiatal hernia, moderate inflammation in the stomach, and mild inflammation in the duodenal bulb. Findings c/w gastritis and duodenitis. Pathology demonstrated chronic inactive gastritis. Negative for H.pylori.    Colonoscopy on 03/07/2017 revealed two 4-5 mm sessile polyps in the descending colon, one 2 mm polyp in the sigmoid colon, diverticulosis, and non-bleeding internal hemorrhoids. Pathology showed that the 2 polyps from the descending colon were tubular adenomas, and the lesion from the sigmoid was a hyperplastic polyp.   April 2022 hospitalized due to pneumonia.   07/31/21 CT lung cancer screening- 1. Lung-RADS 4A, suspicious.  Left lower lobe nudule 86m, left upper lobe nodule 8.548m 10/31/21 CT lung cancer screening- 1. Lung-RADS 4A, suspicious.  left upper lobe nodule 8.55m82mnew left upper lobe nodule 7.3mm60mew 7 mm right upper lobe perifissural nodule   INTERVAL HISTORY  Benjamin Torres is a 66 y.o. 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  He has no new  complaints. Patient presents to discuss results. He continues vaping.   Past Medical History:  Diagnosis Date   Cancer of larynx ([place]) 11/06/2014   COPD (chronic obstructive pulmonary disease) ([facility])    Hypothyroidism    PE (pulmonary thromboembolism) ([place])    right lung, over 10 yrs ago    Past Surgical History:  Procedure Laterality Date   ANAL FISSURE REPAIR     APPENDECTOMY     BIOPSY PHARYNX  2015   Larynx BX   COLONOSCOPY WITH PROPOFOL N/A 03/07/2017   Procedure: COLONOSCOPY WITH PROPOFOL;  Surgeon: Lucilla Lame, MD;  Location: [facility];  Service: Endoscopy;  Laterality: N/A;   ESOPHAGOGASTRODUODENOSCOPY (EGD) WITH PROPOFOL N/A 03/07/2017   Procedure: ESOPHAGOGASTRODUODENOSCOPY (EGD) WITH PROPOFOL;  Surgeon: Lucilla Lame, MD;  Location: [facility];  Service: Endoscopy;  Laterality: N/A;   LEFT HEART CATH AND CORONARY ANGIOGRAPHY Left 09/23/2019   Procedure: LEFT HEART CATH AND CORONARY ANGIOGRAPHY;  Surgeon: Teodoro Spray, MD;  Location: [facility];  Service: Cardiovascular;  Laterality: Left;   POLYPECTOMY  03/07/2017   Procedure: POLYPECTOMY;  Surgeon: Lucilla Lame, MD;  Location: [facility];  Service: Endoscopy;;    History reviewed. No pertinent family history.  Social History:  reports that he has been smoking e-cigarettes. He has a 60.00 pack-year smoking history. He has never used smokeless tobacco. He reports that he does not drink alcohol. No history on file for drug use.  Allergies: No Known Allergies  Current Medications: Current Outpatient Medications  Medication Sig Dispense Refill   acetaminophen (TYLENOL) 500 MG tablet Take 500-1,000 mg by mouth every 6 (six) hours as needed (for pain.).     albuterol (PROVENTIL HFA;VENTOLIN HFA) 108 (90 Base) MCG/ACT inhaler Inhale 1 puff into the lungs every 4 (four) hours as needed for wheezing or shortness of breath.     cyanocobalamin (,VITAMIN B-12,) 1000 MCG/ML injection Inject into  the muscle.     ibuprofen (ADVIL) 200 MG tablet Take 400 mg by mouth as needed for headache.     levothyroxine (SYNTHROID) 75 MCG tablet Take by mouth.     losartan (COZAAR) 100 MG tablet Check with your primary care provider to see if you are still taking this medicine. (Patient taking differently: Take 75 mg by mouth daily. Check with your primary care provider to see if you are still taking this medicine.)     metoprolol succinate (TOPROL-XL) 25 MG 24 hr tablet Take 25 mg by mouth daily.     rosuvastatin (CRESTOR) 10 MG tablet Take 10 mg by mouth at bedtime.     tamsulosin (FLOMAX) 0.4 MG CAPS capsule Take 1 capsule (0.4 mg total) by mouth daily. 90 capsule 3   TRELEGY ELLIPTA 200-62.5-25 MCG/INH AEPB Inhale 1 puff into the lungs daily at 2 PM.      vitamin B-12 (CYANOCOBALAMIN) 1000 MCG tablet Take 1,000 mcg by mouth 2 (two) times a week.     losartan (COZAAR) 50 MG tablet Take 50 mg by mouth daily. (Patient not taking: Reported on 02/20/2022)     No current facility-administered medications for this visit.   Facility-Administered Medications Ordered in Other Visits  Medication Dose Route Frequency  Provider Last Rate Last Admin   sodium chloride flush (NS) 0.9 % injection 3 mL  3 mL Intravenous Q12H Teodoro Spray, MD        Review of Systems  Constitutional: Negative.  Negative for chills, diaphoresis, fever, malaise/fatigue and weight loss (up 10 pound).  HENT: Negative.  Negative for congestion, ear discharge, ear pain, hearing loss, nosebleeds, sinus pain, sore throat and tinnitus.   Eyes: Negative.  Negative for blurred vision, double vision, photophobia and pain.  Respiratory:  Positive for shortness of breath. Negative for cough, hemoptysis, sputum production and wheezing.   Cardiovascular: Negative.  Negative for chest pain, palpitations, orthopnea, leg swelling and PND.  Gastrointestinal: Negative.  Negative for abdominal pain, blood in stool, constipation, diarrhea, heartburn,  melena, nausea and vomiting.  Genitourinary: Negative.  Negative for dysuria, frequency, hematuria and urgency.       LUTS; difficulty initiating stream, voids small amounts, nocturia. Known prostatomegaly and posterior bladder diverticulum; sees urology  Musculoskeletal: Negative.  Negative for back pain, falls, joint pain, myalgias and neck pain.  Skin: Negative.  Negative for itching and rash.  Neurological: Negative.  Negative for dizziness, tremors, speech change, focal weakness, weakness and headaches.  Endo/Heme/Allergies: Negative.  Does not bruise/bleed easily.       Hypothyroidism on levothyroxine.  Psychiatric/Behavioral:  Negative for depression, memory loss and suicidal ideas. The patient is not nervous/anxious and does not have insomnia.   All other systems reviewed and are negative.   Performance status (ECOG): 0 Vitals:   02/20/22 1433  BP: 99/78  Pulse: 70  Resp: 18  Temp: 97.7 F (36.5 C)     Physical Exam Constitutional:      General: He is not in acute distress.    Appearance: He is not diaphoretic.  HENT:     Head: Normocephalic and atraumatic.     Nose: Nose normal.     Mouth/Throat:     Pharynx: No oropharyngeal exudate.  Eyes:     General: No scleral icterus.    Pupils: Pupils are equal, round, and reactive to light.  Cardiovascular:     Rate and Rhythm: Normal rate and regular rhythm.     Heart sounds: No murmur heard. Pulmonary:     Effort: Pulmonary effort is normal. No respiratory distress.     Breath sounds: No rales.     Comments: Decreased breath sounds bilaterally. Chest:     Chest wall: No tenderness.  Abdominal:     General: There is no distension.     Palpations: Abdomen is soft.     Tenderness: There is no abdominal tenderness.  Musculoskeletal:        General: Normal range of motion.     Cervical back: Normal range of motion and neck supple.  Skin:    General: Skin is warm and dry.     Findings: No erythema.  Neurological:      Mental Status: He is alert and oriented to person, place, and time.     Cranial Nerves: No cranial nerve deficit.     Motor: No abnormal muscle tone.     Coordination: Coordination normal.  Psychiatric:        Mood and Affect: Affect normal.    Labs    Latest Ref Rng & Units 11/01/2021    1:53 PM 11/02/2020    1:27 PM 08/11/2020    5:02 AM  CBC  WBC 4.0 - 10.5 K/uL 8.0  7.7  9.8   Hemoglobin  13.0 - 17.0 g/dL 15.4  15.1  12.7   Hematocrit 39.0 - 52.0 % 46.8  44.9  36.8   Platelets 150 - 400 K/uL 195  217  207       Latest Ref Rng & Units 12/27/2021    5:15 PM 11/01/2021    1:53 PM 11/02/2020    1:27 PM  CMP  Glucose 70 - 99 mg/dL  109  111   BUN 8 - 23 mg/dL  12  12   Creatinine 0.61 - 1.24 mg/dL 0.90  0.80  0.84   Sodium 135 - 145 mmol/L  134  134   Potassium 3.5 - 5.1 mmol/L  4.6  4.4   Chloride 98 - 111 mmol/L  101  103   CO2 22 - 32 mmol/L  27  24   Calcium 8.9 - 10.3 mg/dL  8.7  9.1   Total Protein 6.5 - 8.1 g/dL  7.5  7.4   Total Bilirubin 0.3 - 1.2 mg/dL  0.9  0.9   Alkaline Phos 38 - 126 U/L  50  53   AST 15 - 41 U/L  22  21   ALT 0 - 44 U/L  17  16    RADIOGRAPHIC STUDIES: I have personally reviewed the radiological images as listed and agreed with the findings in the report.  CT CHEST LCS NODULE F/U LOW DOSE WO CONTRAST  Result Date: 02/18/2022 CLINICAL DATA:  Lung cancer screening. Current smoker with 51 pack-year history. Asymptomatic EXAM: CT CHEST WITHOUT CONTRAST FOR LUNG CANCER SCREENING NODULE FOLLOW-UP TECHNIQUE: Multidetector CT imaging of the chest was performed following the standard protocol without IV contrast. RADIATION DOSE REDUCTION: This exam was performed according to the departmental dose-optimization program which includes automated exposure control, adjustment of the mA and/or kV according to patient size and/or use of iterative reconstruction technique. COMPARISON:  11/18/2021 FINDINGS: Cardiovascular: Heart size appears within normal limits. Mild  increase caliber of the ascending thoracic aorta measures 4 cm, image 32/2. Aortic atherosclerosis and coronary artery calcifications. Mediastinum/Nodes: No enlarged mediastinal, hilar, or axillary lymph nodes. Thyroid gland, trachea, and esophagus demonstrate no significant findings. Lungs/Pleura: Moderate to severe centrilobular and paraseptal emphysema. Multiple scattered lung nodules are identified on today's study. Several of these nodules are new compared with the previous exam, including: -Nodule within the anteromedial right upper lobe has a mean derived diameter of 7.9 mm. -Within the anteromedial left upper lobe there is a new 7.1 mm nodule. -Previous 3.4 mm posteromedial right lower lobe lung nodule has increased in size now measuring 5.7 mm. Upper Abdomen: No acute findings.  [place] noted Musculoskeletal: No chest wall mass or suspicious bone lesions identified. IMPRESSION: 1. Lung-RADS 4A, suspicious. Follow up low-dose chest CT without contrast in 3 months (please use the following order, "CT CHEST LCS NODULE FOLLOW-UP W/O CM") is recommended. Alternatively, PET may be considered when there is a solid component 55m or larger. 2. Aortic Atherosclerosis (ICD10-I70.0) and Emphysema (ICD10-J43.9). 3. 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 This can be readdressed on future lung cancer screening CTs. Electronically Signed   By: TKerby MoorsM.D.   On: 02/18/2022 07:08   CT Angio Chest Pulmonary Embolism (PE) W or WO Contrast  Result Date: 12/27/2021 CLINICAL DATA:  Pressure involving the right lung, pain which radiates to the center of the back. EXAM: CT ANGIOGRAPHY CHEST WITH CONTRAST TECHNIQUE: Multidetector CT imaging of the chest was performed using the standard protocol during  bolus administration of intravenous contrast. Multiplanar CT image reconstructions and MIPs were obtained to evaluate the vascular anatomy. RADIATION DOSE REDUCTION: This exam was  performed according to the departmental dose-optimization program which includes automated exposure control, adjustment of the mA and/or kV according to patient size and/or use of iterative reconstruction technique. CONTRAST:  12m OMNIPAQUE IOHEXOL 350 MG/ML SOLN COMPARISON:  None Available. FINDINGS: Cardiovascular: Satisfactory opacification of the pulmonary arteries to the segmental level. No evidence of pulmonary embolism. The right and left pulmonary arteries are enlarged, measuring 2.4 cm in diameter, suggestive of pulmonary hypertension. Vascular calcifications are seen in the coronary arteries and aortic arch. Normal heart size. No pericardial effusion. Mediastinum/Nodes: No enlarged mediastinal, hilar, or axillary lymph nodes. Thyroid gland, trachea, and esophagus demonstrate no significant findings. Lungs/Pleura: A 13 mm ground-glass nodule in the left lower lobe is new since prior exam (series 6, image 82) and may be infectious/inflammatory. A upper lobe solid pulmonary nodule measures 5 mm and is new since 10/29/2021 (series 6, image 63). Pulmonary nodule in the left lower lobe measures 7 mm (series 6, image 48), unchanged since 07/30/2021. Upper lobe pulmonary nodule versus scar measures 8 mm (series 6, image 18) and is not significantly changed since 07/30/2021. A previously described left upper lobe pulmonary nodule with adjacent ground-glass opacity has resolved with minimal residual ground-glass opacity (series 6, images 30 9-45). Emphysematous changes are noted. There is no pleural effusion or pneumothorax. Upper Abdomen: There is cholelithiasis without evidence of acute cholecystitis. Musculoskeletal: No chest wall abnormality. No acute or significant osseous findings. Review of the MIP images confirms the above findings. IMPRESSION: 1. No evidence of acute pulmonary embolism. 2. Ground-glass nodule in the left upper lobe is new and may be infectious/inflammatory. 3. Solid pulmonary nodule in the  left upper lobe is new since 10/29/2021. Pulmonary nodules in the left lung are not significantly changed since 07/30/2021. Follow up low-dose chest CT without contrast in 3 months is recommended. 4. Findings suggestive of pulmonary hypertension. Aortic Atherosclerosis (ICD10-I70.0) and Emphysema (ICD10-J43.9). Electronically Signed   By: TZerita BoersM.D.   On: 12/27/2021 17:42       Assessment and plan 1. Lung nodule    # Lung nodules.  Imaging was reviewed and discussed with patient.  11/14/2021, PET scan showed no hypermetabolic pulmonary nodules, majority of the nodules are just at or below the resolution of PET scan.  Consider attention on follow-up with CT chest in 3 months.   #Focus of asymmetric slightly increased hypermetabolic activity of anal verge.  Recommend patient to see gastroenterology for further evaluation/direct visualization.                #  Smoking history Discuss smoking cessation.            # acquired Hypothyroidism TSH 2.9 free T4 1.1 continue synthroid 754m daily.   #Follow-up in 3 months with repeat CT scan of the chest.  I discussed the assessment and treatment plan with the patient.  The patient was provided an opportunity to ask questions and all were answered.  The patient agreed with the plan and demonstrated an understanding of the instructions.  The patient was advised to call back or seek an in person evaluation if the symptoms worsen or if the condition fails to improve as anticipated.  ZhEarlie ServerMD, PhD Hematology Oncology 02/20/2022

## 2022-02-20 NOTE — Assessment & Plan Note (Signed)
#   Lung nodules.  Imaging was reviewed and discussed with patient.  11/14/2021, PET scan showed no hypermetabolic pulmonary nodules, majority of the nodules are just at or below the resolution of PET scan.   02/18/2022, CT scan showed interval development of several new lung nodules and slight increase of previously seen nodules.  Unclear etiology.  Nodules are very small and likely will PET scan detectable sensitivity. Consider attention on follow-up with CT chest in 3 months.

## 2022-02-21 ENCOUNTER — Telehealth: Payer: Self-pay | Admitting: Oncology

## 2022-02-21 ENCOUNTER — Other Ambulatory Visit: Payer: Self-pay

## 2022-02-21 DIAGNOSIS — R911 Solitary pulmonary nodule: Secondary | ICD-10-CM

## 2022-02-21 NOTE — Telephone Encounter (Signed)
Req sent [for CT order to be updated .Marland KitchenKJ

## 2022-02-21 NOTE — Progress Notes (Signed)
T

## 2022-03-22 ENCOUNTER — Ambulatory Visit: Payer: Medicare HMO

## 2022-04-09 ENCOUNTER — Ambulatory Visit: Payer: Medicare HMO | Admitting: Gastroenterology

## 2022-05-23 ENCOUNTER — Ambulatory Visit: Payer: Medicare HMO

## 2022-05-23 ENCOUNTER — Ambulatory Visit
Admission: RE | Admit: 2022-05-23 | Discharge: 2022-05-23 | Disposition: A | Discharge status: Home / Self Care | Payer: Medicare HMO | Source: Ambulatory Visit | Attending: Oncology | Admitting: Oncology

## 2022-05-23 DIAGNOSIS — R911 Solitary pulmonary nodule: Secondary | ICD-10-CM | POA: Diagnosis not present

## 2022-05-23 DIAGNOSIS — J439 Emphysema, unspecified: Secondary | ICD-10-CM | POA: Diagnosis not present

## 2022-05-23 DIAGNOSIS — R918 Other nonspecific abnormal finding of lung field: Secondary | ICD-10-CM | POA: Diagnosis not present

## 2022-05-27 ENCOUNTER — Encounter: Payer: Self-pay | Admitting: Oncology

## 2022-05-27 ENCOUNTER — Inpatient Hospital Stay: Payer: Medicare HMO | Attending: Oncology | Admitting: Oncology

## 2022-05-27 VITALS — BP 90/72 | HR 69 | Temp 97.4°F | Resp 16 | Ht 67.0 in | Wt 158.2 lb

## 2022-05-27 DIAGNOSIS — Z720 Tobacco use: Secondary | ICD-10-CM | POA: Diagnosis not present

## 2022-05-27 DIAGNOSIS — Z8521 Personal history of malignant neoplasm of larynx: Secondary | ICD-10-CM | POA: Insufficient documentation

## 2022-05-27 DIAGNOSIS — C329 Malignant neoplasm of larynx, unspecified: Secondary | ICD-10-CM | POA: Diagnosis not present

## 2022-05-27 DIAGNOSIS — F17290 Nicotine dependence, other tobacco product, uncomplicated: Secondary | ICD-10-CM | POA: Insufficient documentation

## 2022-05-27 DIAGNOSIS — R911 Solitary pulmonary nodule: Secondary | ICD-10-CM

## 2022-05-27 DIAGNOSIS — F17210 Nicotine dependence, cigarettes, uncomplicated: Secondary | ICD-10-CM | POA: Diagnosis not present

## 2022-05-27 DIAGNOSIS — Z923 Personal history of irradiation: Secondary | ICD-10-CM | POA: Insufficient documentation

## 2022-05-27 DIAGNOSIS — E039 Hypothyroidism, unspecified: Secondary | ICD-10-CM

## 2022-05-27 DIAGNOSIS — Z9221 Personal history of antineoplastic chemotherapy: Secondary | ICD-10-CM | POA: Insufficient documentation

## 2022-05-27 NOTE — Assessment & Plan Note (Addendum)
#  Lung nodules.  Imaging was reviewed and discussed with patient.  05/23/22 CT scan showed New 8 x 5 mm RIGHT upper lobe pulmonary nodule, new right middle lobe nodules, wax and wane other nodules, chronic infection/inflammation vs malignancy Refer to pulmonology for further evaluation.

## 2022-05-27 NOTE — Assessment & Plan Note (Signed)
Discussed about smoke cessation.  Patient is not interested.

## 2022-05-27 NOTE — Assessment & Plan Note (Addendum)
#  Stage III supraglottic larynx cancer Clinically he is doing well.  He is 8.5 year post completion of treatment.  Monitor thyroid function annually.

## 2022-05-27 NOTE — Assessment & Plan Note (Addendum)
TSH and T4 were stable, continue synthroid 41mg daily- managed by Dr. MSabra Heck

## 2022-05-27 NOTE — Progress Notes (Signed)
Hematology/Oncology Progress note [phone] [phone]      Clinic Day:  11/03/2019  ASSESSMENT & PLAN:   Cancer of larynx ([place]) # Stage III supraglottic larynx cancer Clinically he is doing well.  He is 8.5 year post completion of treatment.  Monitor thyroid function annually.    Lung nodule # Lung nodules.  Imaging was reviewed and discussed with patient.  05/23/22 CT scan showed New 8 x 5 mm RIGHT upper lobe pulmonary nodule, new right middle lobe nodules, wax and wane other nodules, chronic infection/inflammation vs malignancy Refer to pulmonology for further evaluation.   Tobacco use Discussed about smoke cessation.  Patient is not interested.  Acquired hypothyroidism TSH and T4 were stable, continue synthroid 13mg daily- managed by Dr. MSabra Heck  Orders Placed This Encounter  Procedures   CT Chest Wo Contrast    Standing Status:   Future    Standing Expiration Date:   05/27/2023    Scheduling Instructions:     To be scheduled a few days prior to seeing Dr. YTasia Catchings   Order Specific Question:   [place]?    [place]  Ambulatory referral to Pulmonology    Referral Priority:   Routine    Referral Type:   Consultation    Referral Reason:   Specialty Services Required    Requested Specialty:   Pulmonary Disease    Number of Visits Requested:   1   Follow-up in 3 months after repeat CT scan. All questions were answered. The patient knows to call the clinic with any problems, questions or concerns.  ZEarlie Server MD, PhD [facility] 05/27/2022      Chief Complaint: DGRAYSIN LUCZYNSKIis a 67y.o. 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  PERTINENT ONCOLOGY HISTORY Patient previously followed up by Dr.Corcoran, patient switched care to me on 11/02/20 Extensive medical record review was performed by me  # April 2015, stage III supraglottic larynx  cancer s/p concurrent cetuximab and radiation.  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 PET scan on 08/18/2013 revealed hypermetabolic soft tissue lesion in the right hypopharynx consistent with epiglottic lesion. There also was a high right paratracheal lymph node which was also hypermetabolic and suspicious for metastatic disease.  Clinical stage was T2N1M0.   He was treated with radiation therapy and cetuximab beginning 09/07/2013.  He received 7,000 cGy from 09/07/2013 - 11/10/2013.     06/21/2015 PET scan on  revealed no evidence of recurrent disease.  02/12/2017 PET revealed no evidence of hypermetabolic locoregional or distant metastatic disease.  11/2016 ENT evaluation iwas negative per patient.   He has hypothyroidism.  He is on Synthroid.   He has a posterior bladder diverticulum and prostatomegaly, which causes him to experience LUTS. PSA was 1.91 on 01/13/2017.   EGD on 03/07/2017 revealed a medium sized hiatal hernia, moderate inflammation in the stomach, and mild inflammation in the duodenal bulb. Findings c/w gastritis and duodenitis. Pathology demonstrated chronic inactive gastritis. Negative for H.pylori.    Colonoscopy on 03/07/2017 revealed two 4-5 mm sessile polyps in the descending colon, one 2 mm polyp in the sigmoid colon, diverticulosis, and non-bleeding internal hemorrhoids. Pathology showed that the 2 polyps from the descending colon were tubular adenomas, and the lesion from the sigmoid was a hyperplastic polyp.   April 2022 hospitalized due to pneumonia.   07/31/21 CT lung cancer screening- 1. Lung-RADS  4A, suspicious.  Left lower lobe nudule 64m, left upper lobe nodule 8.538m 10/31/21 CT lung cancer screening- 1. Lung-RADS 4A, suspicious.  left upper lobe nodule 8.47m63mnew left upper lobe nodule 7.3mm26mew 7 mm right upper lobe perifissural nodule   11/14/2021, PET scan showed no hypermetabolic pulmonary nodules, majority of the  nodules are just at or below the resolution of PET scan.   02/18/2022, CT scan showed interval development of several new lung nodules and slight increase of previously seen nodules.  Unclear etiology.  Nodules are very small and likely will PET scan detectable sensitivity.  INTERVAL HISTORY DennKYLIL SWOPESa 66 y67. 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 He continues smoking and vaping.   Past Medical History:  Diagnosis Date   Cancer of [place]arynx (HCC)[place]   COPD (chronic obstructive pulmonary disease) (HCC)    Hypothyroidism    PE (pulmonary thromboembolism) (HCC)[place] right lung, over 10 yrs ago    Past Surgical History:  Procedure Laterality Date   ANAL FISSURE REPAIR     APPENDECTOMY     BIOPSY PHARYNX  2015   Larynx BX   COLONOSCOPY WITH PROPOFOL N/A 03/07/2017   Procedure: COLONOSCOPY WITH PROPOFOL;  Surgeon: WohlLucilla Lame;  [facility];  Laterality: N/A;   ESOPHAGOGASTRODUODENOSCOPY (EGD) WITH PROPOFOL N/A 03/07/2017   Procedure: ESOPHAGOGASTRODUODENOSCOPY (EGD) WITH PROPOFOL;  Surgeon: WohlLucilla Lame;  [facility];  Laterality: N/A;   LEFT HEART CATH AND CORONARY ANGIOGRAPHY Left 09/23/2019   Procedure: LEFT HEART CATH AND CORONARY ANGIOGRAPHY;  Surgeon: FathTeodoro Spray;  [facility];  Service: Cardiovascular;  Laterality: Left;   POLYPECTOMY  03/07/2017   Procedure: POLYPECTOMY;  Surgeon: WohlLucilla Lame;  [facility];;    History reviewed. No pertinent family history.  Social History:  reports that he has been smoking e-cigarettes and cigarettes. He has a 60.00 pack-year smoking history. He has never used smokeless tobacco. He reports that he does not drink alcohol. No history on file  for drug use.  Allergies: No Known Allergies  Current Medications: Current Outpatient Medications  Medication Sig Dispense Refill   acetaminophen (TYLENOL) 500 MG tablet Take 500-1,000 mg by mouth every 6 (six) hours as needed (for pain.).     albuterol (PROVENTIL HFA;VENTOLIN HFA) 108 (90 Base) MCG/ACT inhaler Inhale 1 puff into the lungs every 4 (four) hours as needed for wheezing or shortness of breath.     cyanocobalamin (,VITAMIN B-12,) 1000 MCG/ML injection Inject into the muscle.     gabapentin (NEURONTIN) 100 MG capsule Take 100 mg by mouth daily.     ibuprofen (ADVIL) 200 MG tablet Take 400 mg by mouth as needed for headache.     levothyroxine (SYNTHROID) 75 MCG tablet Take by mouth.     losartan (COZAAR) 100 MG tablet Check with your primary care provider to see if you are still taking this medicine. (Patient taking differently: Take 75 mg by mouth daily. Check with your primary care provider to see if you are still taking this medicine.)     metoprolol succinate (TOPROL-XL) 25 MG 24 hr tablet Take 25 mg by mouth daily.     rosuvastatin (CRESTOR) 10 MG tablet Take 10 mg by  mouth at bedtime.     tamsulosin (FLOMAX) 0.4 MG CAPS capsule Take 1 capsule (0.4 mg total) by mouth daily. 90 capsule 3   TRELEGY ELLIPTA 200-62.5-25 MCG/INH AEPB Inhale 1 puff into the lungs daily at 2 PM.      vitamin B-12 (CYANOCOBALAMIN) 1000 MCG tablet Take 1,000 mcg by mouth 2 (two) times a week.     losartan (COZAAR) 50 MG tablet Take 50 mg by mouth daily. (Patient not taking: Reported on 02/20/2022)     No current facility-administered medications for this visit.   Facility-Administered Medications Ordered in Other Visits  Medication Dose Route Frequency Provider Last Rate Last Admin   sodium chloride flush (NS) 0.9 % injection 3 mL  3 mL Intravenous Q12H Teodoro Spray, MD        Review of Systems  Constitutional: Negative.  Negative for chills, diaphoresis, fever, malaise/fatigue and weight loss  (up 10 pound).  HENT: Negative.  Negative for congestion, ear discharge, ear pain, hearing loss, nosebleeds, sinus pain, sore throat and tinnitus.   Eyes: Negative.  Negative for blurred vision, double vision, photophobia and pain.  Respiratory:  Positive for shortness of breath. Negative for cough, hemoptysis, sputum production and wheezing.   Cardiovascular: Negative.  Negative for chest pain, palpitations, orthopnea, leg swelling and PND.  Gastrointestinal: Negative.  Negative for abdominal pain, blood in stool, constipation, diarrhea, heartburn, melena, nausea and vomiting.  Genitourinary: Negative.  Negative for dysuria, frequency, hematuria and urgency.       LUTS; difficulty initiating stream, voids small amounts, nocturia. Known prostatomegaly and posterior bladder diverticulum; sees urology  Musculoskeletal: Negative.  Negative for back pain, falls, joint pain, myalgias and neck pain.  Skin: Negative.  Negative for itching and rash.  Neurological: Negative.  Negative for dizziness, tremors, speech change, focal weakness, weakness and headaches.  Endo/Heme/Allergies: Negative.  Does not bruise/bleed easily.       Hypothyroidism on levothyroxine.  Psychiatric/Behavioral:  Negative for depression, memory loss and suicidal ideas. The patient is not nervous/anxious and does not have insomnia.   All other systems reviewed and are negative.   Performance status (ECOG): 0 Vitals:   05/27/22 1333 05/27/22 1337  BP: (!) 84/64 90/72  Pulse: 69 69  Resp: 16   Temp: (!) 97.4 F (36.3 C)   SpO2: 100%      Physical Exam Constitutional:      General: He is not in acute distress.    Appearance: He is not diaphoretic.  HENT:     Head: Normocephalic and atraumatic.     Nose: Nose normal.     Mouth/Throat:     Pharynx: No oropharyngeal exudate.  Eyes:     General: No scleral icterus.    Pupils: Pupils are equal, round, and reactive to light.  Cardiovascular:     Rate and Rhythm: Normal  rate and regular rhythm.     Heart sounds: No murmur heard. Pulmonary:     Effort: Pulmonary effort is normal. No respiratory distress.     Breath sounds: No rales.     Comments: Decreased breath sounds bilaterally. Chest:     Chest wall: No tenderness.  Abdominal:     General: There is no distension.     Palpations: Abdomen is soft.     Tenderness: There is no abdominal tenderness.  Musculoskeletal:        General: Normal range of motion.     Cervical back: Normal range of motion and neck supple.  Skin:  General: Skin is warm and dry.     Findings: No erythema.  Neurological:     Mental Status: He is alert and oriented to person, place, and time.     Cranial Nerves: No cranial nerve deficit.     Motor: No abnormal muscle tone.     Coordination: Coordination normal.  Psychiatric:        Mood and Affect: Affect normal.    Labs    Latest Ref Rng & Units 11/01/2021    1:53 PM 11/02/2020    1:27 PM 08/11/2020    5:02 AM  CBC  WBC 4.0 - 10.5 K/uL 8.0  7.7  9.8   Hemoglobin 13.0 - 17.0 g/dL 15.4  15.1  12.7   Hematocrit 39.0 - 52.0 % 46.8  44.9  36.8   Platelets 150 - 400 K/uL 195  217  207       Latest Ref Rng & Units 12/27/2021    5:15 PM 11/01/2021    1:53 PM 11/02/2020    1:27 PM  CMP  Glucose 70 - 99 mg/dL  109  111   BUN 8 - 23 mg/dL  12  12   Creatinine 0.61 - 1.24 mg/dL 0.90  0.80  0.84   Sodium 135 - 145 mmol/L  134  134   Potassium 3.5 - 5.1 mmol/L  4.6  4.4   Chloride 98 - 111 mmol/L  101  103   CO2 22 - 32 mmol/L  27  24   Calcium 8.9 - 10.3 mg/dL  8.7  9.1   Total Protein 6.5 - 8.1 g/dL  7.5  7.4   Total Bilirubin 0.3 - 1.2 mg/dL  0.9  0.9   Alkaline Phos 38 - 126 U/L  50  53   AST 15 - 41 U/L  22  21   ALT 0 - 44 U/L  17  16    RADIOGRAPHIC STUDIES: I have personally reviewed the radiological images as listed and agreed with the findings in the report.  CT Chest Wo Contrast  Result Date: 05/24/2022 CLINICAL DATA:  Lung nodule in a patient with history  of laryngeal cancer, stage III supraglottic larynx cancer by report. * Tracking Code: BO * EXAM: CT CHEST WITHOUT CONTRAST TECHNIQUE: Multidetector CT imaging of the chest was performed following the standard protocol without IV contrast. RADIATION DOSE REDUCTION: This exam was performed according to the departmental dose-optimization program which includes automated exposure control, adjustment of the mA and/or kV according to patient size and/or use of iterative reconstruction technique. COMPARISON:  February 15, 2022 FINDINGS: Cardiovascular: Calcified aortic atherosclerotic changes are mild. No aneurysmal dilation of the thoracic aorta. Three-vessel coronary artery disease with extensive calcification of LEFT and RIGHT coronary circulation. Normal caliber of central pulmonary vasculature. Normal heart size. Limited assessment of cardiovascular structures given lack of intravenous contrast. Mediastinum/Nodes: Small hiatal hernia. No thoracic inlet, axillary, mediastinal or hilar adenopathy. Esophagus grossly normal. Lungs/Pleura: 8 x 5 mm RIGHT upper lobe pulmonary nodule (image 26/3) new compared to previous imaging. Medial RIGHT upper lobe pulmonary nodule (image 59/3) 5 mm, decreased from 8 mm when compared to prior imaging from October of 2023. In the medial LEFT upper lobe adjacent to this there is resolution nearly completely of a nodule that was near 6-7 mm. Posteromedial RIGHT lower lobe nodule nearly completely resolved as well. Scattered small RIGHT middle lobe nodules largest along the posterior aspect of the RIGHT middle lobe (image 91/3) 5 mm. 2-3 mm  nodules at least 1 of which is new in the RIGHT middle lobe on images 88, 91 and 98. Nodule on image 98 is likely stable to 3 mm compared to previous imaging. LEFT lower lobe pulmonary nodule in the superior segment of the LEFT lower lobe (image 74/3) 5-6 mm, stable compared to previous imaging. Spiculated area nodularity in the LEFT upper lobe measuring  approximately 10 x 2 mm with more bandlike imaging features, unchanged as well since October of 2023 airways are patent. No consolidation or pleural effusion. Marked pulmonary emphysema worse in the upper lobes. Upper Abdomen: Small hiatal hernia. No acute upper abdominal process to the extent evaluated. Musculoskeletal: No acute bone finding. No destructive bone process. Spinal degenerative changes. IMPRESSION: 1. New 8 x 5 mm RIGHT upper lobe pulmonary nodule. Continued follow-up is suggested given that this is a new finding 3 to six-month follow-up may be helpful for reasons outlined below. Strictly speaking, area bronchogenic neoplasm would be difficult to exclude in this high-risk patient. 2. New RIGHT middle lobe nodules and waxing and waning features of other pulmonary nodules raising the question of chronic infection such as MAI. Pulmonology consultation if not yet obtained and correlation with any history of chronic infection may be helpful to guide further management. 3. Marked pulmonary emphysema worse in the upper lobes. 4. Three-vessel coronary artery disease with extensive calcification of LEFT and RIGHT coronary circulation. 5. Small hiatal hernia. 6. Aortic atherosclerosis. Aortic Atherosclerosis (ICD10-I70.0) and Emphysema (ICD10-J43.9). z Electronically Signed   By: Zetta Bills M.D.   On: 05/24/2022 09:38

## 2022-06-09 DIAGNOSIS — J449 Chronic obstructive pulmonary disease, unspecified: Secondary | ICD-10-CM | POA: Insufficient documentation

## 2022-06-09 DIAGNOSIS — E785 Hyperlipidemia, unspecified: Secondary | ICD-10-CM | POA: Insufficient documentation

## 2022-06-09 DIAGNOSIS — I2510 Atherosclerotic heart disease of native coronary artery without angina pectoris: Secondary | ICD-10-CM | POA: Insufficient documentation

## 2022-06-09 NOTE — Progress Notes (Unsigned)
Cardiology Office Note  Date:  06/10/2022   ID:  Benjamin, Torres 01-17-1956, MRN [id number]  PCP:  Rusty Aus, MD   Chief Complaint  Patient presents with   New Patient (Initial Visit)    Ref by Dr. Emily Filbert to establish care for CAD; former Dr. Ubaldo Glassing patient. Patient c/o shortness of breath with little to no exertion. Medications reviewed by the patient verbally.     HPI:  Benjamin Torres is a 67 year old gentleman with past medical history of pulmonary emphysema,  history of a PE,  tobacco abuse  Throat cancer catheterization on 09/23/2019 following an abnormal stress test and echocardiogram. Catheterization revealed a chronically occluded proximal RCA lesion with collaterals, a first marginal lesion that was chronically occluded, and a second diagonal lesion that was 75% stenosed recommendations were for medical management.  Echo 5/21: EF 40% Who presents to establish care in the [facility] office for his cardiomyopathy, coronary disease  He reports long history of smoking, continues to smoke Wife who presents with him today reports he has chronic shortness of breath on exertion Very short of breath cleaning in the shower, has to sit down Unable to walk very fast or far secondary to shortness of breath symptoms On trilogy, albuterol Scheduled to see pulmonary later this week Not on oxygen at home  Reports that he stopped smoking when diagnosed with throat cancer but restarted smoking again several years later  Denies chest pain concerning for angina Cardiac catheterization results from May 2021 pulled up and discussed with him  Last echocardiogram May 2021  EF estimated 40% at that time  EKG personally reviewed by myself on todays visit Normal sinus rhythm rate 71 bpm no significant ST-T wave changes    PMH:   has a past medical history of Cancer of larynx ([place]) (11/06/2014), COPD (chronic obstructive pulmonary disease) ([place]), Hypothyroidism, and PE (pulmonary  thromboembolism) ([place]).  PSH:    Past Surgical History:  Procedure Laterality Date   ANAL FISSURE REPAIR     APPENDECTOMY     BIOPSY PHARYNX  2015   Larynx BX   COLONOSCOPY WITH PROPOFOL N/A 03/07/2017   Procedure: COLONOSCOPY WITH PROPOFOL;  Surgeon: Lucilla Lame, MD;  Location: [place];  Service: Endoscopy;  Laterality: N/A;   ESOPHAGOGASTRODUODENOSCOPY (EGD) WITH PROPOFOL N/A 03/07/2017   Procedure: ESOPHAGOGASTRODUODENOSCOPY (EGD) WITH PROPOFOL;  Surgeon: Lucilla Lame, MD;  Location: [place];  Service: Endoscopy;  Laterality: N/A;   LEFT HEART CATH AND CORONARY ANGIOGRAPHY Left 09/23/2019   Procedure: LEFT HEART CATH AND CORONARY ANGIOGRAPHY;  Surgeon: Teodoro Spray, MD;  Location: [place] [facility];  Service: Cardiovascular;  Laterality: Left;   POLYPECTOMY  03/07/2017   Procedure: POLYPECTOMY;  Surgeon: Lucilla Lame, MD;  [facility];  Service: Endoscopy;;    Current Outpatient Medications  Medication Sig Dispense Refill   acetaminophen (TYLENOL) 500 MG tablet Take 500-1,000 mg by mouth every 6 (six) hours as needed (for pain.).     albuterol (PROVENTIL HFA;VENTOLIN HFA) 108 (90 Base) MCG/ACT inhaler Inhale 1 puff into the lungs every 4 (four) hours as needed for wheezing or shortness of breath.     cyanocobalamin (,VITAMIN B-12,) 1000 MCG/ML injection Inject into the muscle.     gabapentin (NEURONTIN) 100 MG capsule Take 100 mg by mouth daily.     ibuprofen (ADVIL) 200 MG tablet Take 400 mg by mouth as needed for headache.     levothyroxine (SYNTHROID) 75 MCG tablet Take  by mouth.     metoprolol succinate (TOPROL-XL) 25 MG 24 hr tablet Take 25 mg by mouth daily.     montelukast (SINGULAIR) 10 MG tablet Take 10 mg by mouth daily.     rosuvastatin (CRESTOR) 10 MG tablet Take 10 mg by mouth at bedtime.     tamsulosin (FLOMAX) 0.4 MG CAPS capsule Take 1 capsule (0.4 mg total) by mouth daily. 90 capsule 3   TRELEGY ELLIPTA 200-62.5-25  MCG/INH AEPB Inhale 1 puff into the lungs daily at 2 PM.      No current facility-administered medications for this visit.   Facility-Administered Medications Ordered in Other Visits  Medication Dose Route Frequency Provider Last Rate Last Admin   sodium chloride flush (NS) 0.9 % injection 3 mL  3 mL Intravenous Q12H Teodoro Spray, MD         Allergies:   Patient has no known allergies.   Social History:  The patient  reports that he has been smoking e-cigarettes and cigarettes. He has a 60.00 pack-year smoking history. He has never used smokeless tobacco. He reports that he does not drink alcohol.   Family History:   family history is not on file.    Review of Systems: Review of Systems  Constitutional: Negative.   HENT: Negative.    Respiratory: Negative.    Cardiovascular: Negative.   Gastrointestinal: Negative.   Musculoskeletal: Negative.   Neurological: Negative.   Psychiatric/Behavioral: Negative.    All other systems reviewed and are negative.    PHYSICAL EXAM: VS:  BP 120/82 (BP Location: Left Arm, Patient Position: Sitting, Cuff Size: Normal)   Pulse 71   Ht 5' 6"$  (1.676 m)   Wt 158 lb 8 oz (71.9 kg)   SpO2 96%   BMI 25.58 kg/m  , BMI Body mass index is 25.58 kg/m. GEN: Well nourished, well developed, in no acute distress HEENT: normal Neck: no JVD, carotid bruits, or masses Cardiac: RRR; no murmurs, rubs, or gallops,no edema  Respiratory: Decreased breath sounds throughout GI: soft, nontender, nondistended, + BS MS: no deformity or atrophy Skin: warm and dry, no rash Neuro:  Strength and sensation are intact Psych: euthymic mood, full affect  Recent Labs: 11/01/2021: ALT 17; BUN 12; Hemoglobin 15.4; Platelets 195; Potassium 4.6; Sodium 134; TSH 2.983 12/27/2021: Creatinine, Ser 0.90    Lipid Panel No results found for: "CHOL", "HDL", "LDLCALC", "TRIG"    Wt Readings from Last 3 Encounters:  06/10/22 158 lb 8 oz (71.9 kg)  05/27/22 158 lb 3.2 oz  (71.8 kg)  02/20/22 157 lb 8 oz (71.4 kg)     ASSESSMENT AND PLAN:  Problem List Items Addressed This Visit       Cardiology Problems   CAD (coronary artery disease) - Primary   Hyperlipidemia     Other   Tobacco use (Chronic)   COPD (chronic obstructive pulmonary disease) (HCC)   Relevant Medications   montelukast (SINGULAIR) 10 MG tablet   Coronary artery disease with stable angina Occluded RCA noted 2021 with collaterals from left to right, also occluded OM High risk of worsening coronary disease given continued smoking Cholesterol numbers well-controlled, not a diabetic Denies chest pain concerning for angina but does report significant shortness of breath on exertion  Cardiomyopathy [facility] notes reviewed, EF estimated 40% in May 2021 We have ordered repeat echocardiogram given shortness of breath symptoms If ejection fraction remains low, could consider medication changes such as adding low-dose Entresto, Jardiance, spironolactone in a stepwise fashion  COPD/emphysema Long history of smoking, nodules noted in his lung CT Scheduled to see pulmonary later this week Smoking cessation recommended Prior history of throat cancer at which time he stopped smoking in the past but restarted   Total encounter time more than 60 minutes  Greater than 50% was spent in counseling and coordination of care with the patient    Signed, Esmond Plants, M.D., Ph.D. [place], [place]

## 2022-06-10 ENCOUNTER — Encounter: Payer: Self-pay | Admitting: Cardiovascular Disease

## 2022-06-10 ENCOUNTER — Ambulatory Visit: Payer: Medicare HMO | Attending: Cardiovascular Disease | Admitting: Cardiovascular Disease

## 2022-06-10 VITALS — BP 120/82 | HR 71 | Ht 66.0 in | Wt 158.5 lb

## 2022-06-10 DIAGNOSIS — I429 Cardiomyopathy, unspecified: Secondary | ICD-10-CM

## 2022-06-10 DIAGNOSIS — Z720 Tobacco use: Secondary | ICD-10-CM | POA: Diagnosis not present

## 2022-06-10 DIAGNOSIS — I428 Other cardiomyopathies: Secondary | ICD-10-CM

## 2022-06-10 DIAGNOSIS — I25118 Atherosclerotic heart disease of native coronary artery with other forms of angina pectoris: Secondary | ICD-10-CM

## 2022-06-10 DIAGNOSIS — J432 Centrilobular emphysema: Secondary | ICD-10-CM | POA: Diagnosis not present

## 2022-06-10 DIAGNOSIS — E782 Mixed hyperlipidemia: Secondary | ICD-10-CM | POA: Diagnosis not present

## 2022-06-10 DIAGNOSIS — I2510 Atherosclerotic heart disease of native coronary artery without angina pectoris: Secondary | ICD-10-CM | POA: Diagnosis not present

## 2022-06-10 NOTE — Patient Instructions (Addendum)
Medication Instructions:  No changes  If you need a refill on your cardiac medications before your next appointment, please call your pharmacy.   Lab work: No new labs needed  Testing/Procedures:  Echo for cardiomyopathy, CAD  Your physician has requested that you have an echocardiogram. Echocardiography is a painless test that uses sound waves to create images of your heart. It provides your doctor with information about the size and shape of your heart and how well your heart's chambers and valves are working. This procedure takes approximately one hour. There are no restrictions for this procedure. Please do NOT wear cologne, perfume, aftershave, or lotions (deodorant is allowed). Please arrive 15 minutes prior to your appointment time.   Follow-Up: At [facility], you and your health needs are our priority.  As part of our continuing mission to provide you with exceptional heart care, we have created designated Provider Care Teams.  These Care Teams include your primary Cardiologist (physician) and Advanced Practice Providers (APPs -  Physician Assistants and Nurse Practitioners) who all work together to provide you with the care you need, when you need it.  You will need a follow up appointment in 6 months  Providers on your designated Care Team:   Murray Hodgkins, NP Christell Faith, PA-C Cadence Kathlen Mody, [place]  COVID-19 Vaccine Information can be found at: [email or web address] For questions related to vaccine distribution or appointments, please email [email or web address] or call [phone].

## 2022-06-13 ENCOUNTER — Ambulatory Visit: Payer: Medicare HMO | Admitting: Student in an Organized Health Care Education/Training Program

## 2022-06-13 ENCOUNTER — Telehealth: Payer: Self-pay | Admitting: Student in an Organized Health Care Education/Training Program

## 2022-06-13 ENCOUNTER — Encounter: Payer: Self-pay | Admitting: Student in an Organized Health Care Education/Training Program

## 2022-06-13 VITALS — BP 122/82 | HR 68 | Temp 97.8°F | Ht 66.0 in | Wt 158.8 lb

## 2022-06-13 DIAGNOSIS — Z720 Tobacco use: Secondary | ICD-10-CM | POA: Diagnosis not present

## 2022-06-13 DIAGNOSIS — J432 Centrilobular emphysema: Secondary | ICD-10-CM

## 2022-06-13 MED ORDER — NICOTINE 7 MG/24HR TD PT24: 7.0000 mg | MEDICATED_PATCH | TRANSDERMAL | 0 refills | Status: AC

## 2022-06-13 MED ORDER — NICOTINE POLACRILEX 2 MG MT LOZG: 2.0000 mg | LOZENGE | OROMUCOSAL | 3 refills | Status: DC | PRN

## 2022-06-13 MED ORDER — NICOTINE 21 MG/24HR TD PT24: 21.0000 mg | MEDICATED_PATCH | TRANSDERMAL | 0 refills | Status: AC

## 2022-06-13 MED ORDER — NICOTINE 14 MG/24HR TD PT24: 14.0000 mg | MEDICATED_PATCH | TRANSDERMAL | 0 refills | Status: AC

## 2022-06-13 NOTE — Progress Notes (Signed)
Synopsis: Referred in for pulmonary nodule by Rusty Aus, MD  Assessment & Plan:   #Pulmonary Nodules  The patient is presenting for multiple waxing and waning pulmonary nodules, the newest of which are in the RUL as well as in the RML. The CT from October had shown multiple nodules that appear to be resolved on January's CT. The RUL nodule, while carrying a 40% risk of malignancy per the [facility] SPN model, was present and minuscule on October's CT. The rapid waxing and waning nature of Benjamin Torres nodules makes me think this is likely of similar/benign etiology.  Given the waxing and waning nature of these nodules, and the patient's active smoking, I recommend repeating the chest CT in 3 months to further assess their progression. Should any nodule persist, it could be a potential target for PET/CT and subsequent biopsy.  -repeat Chest CT in 3 months (ordered by Dr. Tasia Catchings)  #Centrilobular emphysema [facility])  Notable emphysema on his chest CT and he's an active smoker. Ample smoking cessation counseling provided. Will order a PFT to assess degree of obstruction and air trapping. I will also referring to pulmonary rehabilitation.  - Pulmonary Function Test [facility] Only; Future - AMB referral to pulmonary rehabilitation  2. Tobacco use  Counseled Benjamin Torres extensively on the importance of smoking cessation. I will provide him with prescriptions for nicotine patches and lozenges, as well as resources to help him in his journey towards abstinence.  - nicotine (NICODERM CQ - DOSED IN MG/24 HOURS) 21 mg/24hr patch; Place 1 patch (21 mg total) onto the skin daily.  Dispense: 42 patch; Refill: 0 - nicotine (NICODERM CQ - DOSED IN MG/24 HOURS) 14 mg/24hr patch; Place 1 patch (14 mg total) onto the skin daily for 14 days.  Dispense: 14 patch; Refill: 0 - nicotine (NICODERM CQ - DOSED IN MG/24 HR) 7 mg/24hr patch; Place 1 patch (7 mg total) onto the skin daily for 14 days.  Dispense: 14 patch;  Refill: 0 - nicotine polacrilex (NICOTINE MINI) 2 MG lozenge; Take 1 lozenge (2 mg total) by mouth every 2 (two) hours as needed for smoking cessation.  Dispense: 72 lozenge; Refill: 3   Return in about 3 months (around 09/02/2022).  I spent 60 minutes caring for this patient today, including preparing to see the patient, obtaining a medical history , reviewing a separately obtained history, performing a medically appropriate examination and/or evaluation, counseling and educating the patient/family/caregiver, ordering medications, tests, or procedures, documenting clinical information in the electronic health record, and independently interpreting results (not separately reported/billed) and communicating results to the patient/family/caregiver  Armando Reichert, MD [facility] 06/13/2022 2:06 PM    End of visit medications:  Meds ordered this encounter  Medications   nicotine (NICODERM CQ - DOSED IN MG/24 HOURS) 21 mg/24hr patch    Sig: Place 1 patch (21 mg total) onto the skin daily.    Dispense:  42 patch    Refill:  0   nicotine (NICODERM CQ - DOSED IN MG/24 HOURS) 14 mg/24hr patch    Sig: Place 1 patch (14 mg total) onto the skin daily for 14 days.    Dispense:  14 patch    Refill:  0   nicotine (NICODERM CQ - DOSED IN MG/24 HR) 7 mg/24hr patch    Sig: Place 1 patch (7 mg total) onto the skin daily for 14 days.    Dispense:  14 patch    Refill:  0   nicotine  polacrilex (NICOTINE MINI) 2 MG lozenge    Sig: Take 1 lozenge (2 mg total) by mouth every 2 (two) hours as needed for smoking cessation.    Dispense:  72 lozenge    Refill:  3     Current Outpatient Medications:    acetaminophen-codeine (TYLENOL #3) 300-30 MG tablet, Take 1 tablet by mouth as needed., Disp: , Rfl:    albuterol (PROVENTIL HFA;VENTOLIN HFA) 108 (90 Base) MCG/ACT inhaler, Inhale 1 puff into the lungs every 4 (four) hours as needed for wheezing or shortness of breath., Disp: , Rfl:     cyanocobalamin (,VITAMIN B-12,) 1000 MCG/ML injection, Inject into the muscle., Disp: , Rfl:    gabapentin (NEURONTIN) 100 MG capsule, Take 100 mg by mouth daily., Disp: , Rfl:    ibuprofen (ADVIL) 200 MG tablet, Take 400 mg by mouth as needed for headache., Disp: , Rfl:    levothyroxine (SYNTHROID) 75 MCG tablet, Take by mouth., Disp: , Rfl:    metoprolol succinate (TOPROL-XL) 25 MG 24 hr tablet, Take 25 mg by mouth daily., Disp: , Rfl:    montelukast (SINGULAIR) 10 MG tablet, Take 10 mg by mouth daily., Disp: , Rfl:    nicotine (NICODERM CQ - DOSED IN MG/24 HOURS) 14 mg/24hr patch, Place 1 patch (14 mg total) onto the skin daily for 14 days., Disp: 14 patch, Rfl: 0   nicotine (NICODERM CQ - DOSED IN MG/24 HOURS) 21 mg/24hr patch, Place 1 patch (21 mg total) onto the skin daily., Disp: 42 patch, Rfl: 0   nicotine (NICODERM CQ - DOSED IN MG/24 HR) 7 mg/24hr patch, Place 1 patch (7 mg total) onto the skin daily for 14 days., Disp: 14 patch, Rfl: 0   nicotine polacrilex (NICOTINE MINI) 2 MG lozenge, Take 1 lozenge (2 mg total) by mouth every 2 (two) hours as needed for smoking cessation., Disp: 72 lozenge, Rfl: 3   rosuvastatin (CRESTOR) 10 MG tablet, Take 10 mg by mouth at bedtime., Disp: , Rfl:    tamsulosin (FLOMAX) 0.4 MG CAPS capsule, Take 1 capsule (0.4 mg total) by mouth daily., Disp: 90 capsule, Rfl: 3   TRELEGY ELLIPTA 200-62.5-25 MCG/INH AEPB, Inhale 1 puff into the lungs daily at 2 PM. , Disp: , Rfl:  No current facility-administered medications for this visit.  Facility-Administered Medications Ordered in Other Visits:    sodium chloride flush (NS) 0.9 % injection 3 mL, 3 mL, Intravenous, Q12H, Fath, Javier Docker, MD   Subjective:   PATIENT ID: Benjamin Torres GENDER: male DOB: 08/25/1955, MRN: [id number]  Chief Complaint  Patient presents with   Consult    PET scan results. SOB with exertion.    HPI  Benjamin Torres is a pleasant 67 year old male presenting to clinic for the  evaluation of shortness of breath as well as pulmonary nodules.  Patient reports feeling short of breath with minimal exertion. He has a cough that he notices a couple of times a day and that is productive of greenish sputum. He has no wheezing, no chest pain and no chest tightness. He is followed by his primary care provider and has been prescribed Trelegy as a maintenance inhaler and albuterol as needed. He feels that his shortness of breath has worsened over time. He reports a few episodes of pneumonia in the past few years, but none this last year.  Benjamin Torres is also followed for lung cancer screening and has underwent multiple chest CT's to that effect. He has had multiple  waxing and waning pulmonary nodules for which he is being followed. His most recent chest CT's were in January of 2024, October of 2023, and August of 2023. He is also followed by Dr. Tasia Catchings from oncology for a history of supraglottic laryngeal cancer 9 years ago and gets regular surveillance imaging.  Benjamin Torres is a smoker and continues to smoke. He has around a 60 pack year history of smoking. He has a dog but no other pets (no cats or birds). He used to work in a [employer].  Ancillary information including prior medications, full medical/surgical/family/social histories, and PFTs (when available) are listed below and have been reviewed.   Review of Systems  Constitutional:  Negative for chills, fever, malaise/fatigue and weight loss.  Respiratory:  Positive for cough, sputum production and shortness of breath. Negative for hemoptysis and wheezing.   Cardiovascular:  Negative for chest pain, leg swelling and PND.  Skin:  Negative for rash.     Objective:   Vitals:   06/13/22 1326  BP: 122/82  Pulse: 68  Temp: 97.8 F (36.6 C)  SpO2: 97%  Weight: 158 lb 12.8 oz (72 kg)  Height: 5' 6"$  (1.676 m)   97% on RA. Trended on room air around the office, maintained normal oxygenation of 94% with notable dyspnea.  BMI  Readings from Last 3 Encounters:  06/13/22 25.63 kg/m  06/10/22 25.58 kg/m  05/27/22 24.78 kg/m   Wt Readings from Last 3 Encounters:  06/13/22 158 lb 12.8 oz (72 kg)  06/10/22 158 lb 8 oz (71.9 kg)  05/27/22 158 lb 3.2 oz (71.8 kg)    Physical Exam Constitutional:      General: He is not in acute distress.    Appearance: Normal appearance. He is not ill-appearing.  HENT:     Head: Normocephalic.     Mouth/Throat:     Mouth: Mucous membranes are moist.  Cardiovascular:     Rate and Rhythm: Normal rate and regular rhythm.     Pulses: Normal pulses.     Heart sounds: Normal heart sounds.  Pulmonary:     Effort: Pulmonary effort is normal.     Breath sounds: Normal breath sounds. No wheezing, rhonchi or rales.  Abdominal:     Palpations: Abdomen is soft.  Musculoskeletal:     Right lower leg: No edema.     Left lower leg: No edema.  Neurological:     General: No focal deficit present.     Mental Status: He is alert and oriented to person, place, and time. Mental status is at baseline.     Ancillary Information    Past Medical History:  Diagnosis Date   Cancer of larynx ([place]) 11/06/2014   COPD (chronic obstructive pulmonary disease) ([facility])    Hypothyroidism    PE (pulmonary thromboembolism) ([place])    right lung, over 10 yrs ago     History reviewed. No pertinent family history.   Past Surgical History:  Procedure Laterality Date   ANAL FISSURE REPAIR     APPENDECTOMY     BIOPSY PHARYNX  2015   Larynx BX   COLONOSCOPY WITH PROPOFOL N/A 03/07/2017   Procedure: COLONOSCOPY WITH PROPOFOL;  Surgeon: Lucilla Lame, MD;  Location: [place];  Service: Endoscopy;  Laterality: N/A;   ESOPHAGOGASTRODUODENOSCOPY (EGD) WITH PROPOFOL N/A 03/07/2017   Procedure: ESOPHAGOGASTRODUODENOSCOPY (EGD) WITH PROPOFOL;  Surgeon: Lucilla Lame, MD;  Location: [place];  Service: Endoscopy;  Laterality: N/A;   LEFT HEART CATH AND CORONARY  ANGIOGRAPHY Left 09/23/2019    Procedure: LEFT HEART CATH AND CORONARY ANGIOGRAPHY;  Surgeon: Teodoro Spray, MD;  Location: [place] [facility];  Service: Cardiovascular;  Laterality: Left;   POLYPECTOMY  03/07/2017   Procedure: POLYPECTOMY;  Surgeon: Lucilla Lame, MD;  Location: [facility];  Service: Endoscopy;;    Social History   Socioeconomic History   Marital status: Married    Spouse name: Not on file   Number of children: Not on file   Years of education: Not on file   Highest education level: Not on file  Occupational History   Not on file  Tobacco Use   Smoking status: Every Day    Packs/day: 1.50    Years: 40.00    Total pack years: 60.00    Types: E-cigarettes, Cigarettes    Last attempt to quit: 2015    Years since quitting: 9.1   Smokeless tobacco: Never   Tobacco comments:    Smokes 12-15 cigarettes daily. 06/13/2022.   Vaping Use   Vaping Use: Former  Substance and Sexual Activity   Alcohol use: No    Alcohol/week: 0.0 standard drinks of alcohol   Drug use: Not on file   Sexual activity: Not on file  Other Topics Concern   Not on file  Social History Narrative   Not on file   Social Determinants of Health   Financial Resource Strain: Not on file  Food Insecurity: Not on file  Transportation Needs: Not on file  Physical Activity: Not on file  Stress: Not on file  Social Connections: Not on file  Intimate Partner Violence: Not on file     No Known Allergies   CBC    Component Value Date/Time   WBC 8.0 11/01/2021 1353   RBC 5.25 11/01/2021 1353   HGB 15.4 11/01/2021 1353   HGB 16.1 05/21/2014 1941   HCT 46.8 11/01/2021 1353   HCT 48.3 05/21/2014 1941   PLT 195 11/01/2021 1353   PLT 192 05/21/2014 1941   MCV 89.1 11/01/2021 1353   MCV 91 05/21/2014 1941   MCH 29.3 11/01/2021 1353   MCHC 32.9 11/01/2021 1353   RDW 12.8 11/01/2021 1353   RDW 15.0 (H) 05/21/2014 1941   LYMPHSABS 1.6 11/01/2021 1353   LYMPHSABS 1.0 04/07/2014 1057   MONOABS 0.7 11/01/2021 1353    MONOABS 0.5 04/07/2014 1057   EOSABS 0.2 11/01/2021 1353   EOSABS 0.1 04/07/2014 1057   BASOSABS 0.0 11/01/2021 1353   BASOSABS 0.0 04/07/2014 1057    Pulmonary Functions Testing Results:     No data to display          Outpatient Medications Prior to Visit  Medication Sig Dispense Refill   acetaminophen-codeine (TYLENOL #3) 300-30 MG tablet Take 1 tablet by mouth as needed.     albuterol (PROVENTIL HFA;VENTOLIN HFA) 108 (90 Base) MCG/ACT inhaler Inhale 1 puff into the lungs every 4 (four) hours as needed for wheezing or shortness of breath.     cyanocobalamin (,VITAMIN B-12,) 1000 MCG/ML injection Inject into the muscle.     gabapentin (NEURONTIN) 100 MG capsule Take 100 mg by mouth daily.     ibuprofen (ADVIL) 200 MG tablet Take 400 mg by mouth as needed for headache.     levothyroxine (SYNTHROID) 75 MCG tablet Take by mouth.     metoprolol succinate (TOPROL-XL) 25 MG 24 hr tablet Take 25 mg by mouth daily.     montelukast (SINGULAIR) 10 MG tablet Take 10 mg  by mouth daily.     rosuvastatin (CRESTOR) 10 MG tablet Take 10 mg by mouth at bedtime.     tamsulosin (FLOMAX) 0.4 MG CAPS capsule Take 1 capsule (0.4 mg total) by mouth daily. 90 capsule 3   TRELEGY ELLIPTA 200-62.5-25 MCG/INH AEPB Inhale 1 puff into the lungs daily at 2 PM.      acetaminophen (TYLENOL) 500 MG tablet Take 500-1,000 mg by mouth every 6 (six) hours as needed (for pain.).     Facility-Administered Medications Prior to Visit  Medication Dose Route Frequency Provider Last Rate Last Admin   sodium chloride flush (NS) 0.9 % injection 3 mL  3 mL Intravenous Q12H Teodoro Spray, MD

## 2022-06-13 NOTE — Telephone Encounter (Signed)
ATC patient's spouse, Dawn(DPR). No answer with no option to leave vm. Line rang >11mn.  [facility]

## 2022-06-13 NOTE — Patient Instructions (Signed)
The [place] Quitline: Call 1-800-QUIT-NOW (1-800-784-8669). The [place] Quitline is a free service for [place] residents. Trained counselors are available from 8 am until 3 am, 365 days per year. Services are available in both English and Spanish.   Web Resources Free online support programs can help you track your progress and share experiences with others who are quitting. These are examples: www.becomeanex.org www.trytostop.org  www.smokefree.gov  www.everydayhealth.com/smoking-cessation/index.aspx  [facility]: offers comprehensive in-person tobacco treatment counseling at [facility] building ([address] [place] [address]).  Open to everyone. Virtual appointments available. Free parking. Call [phone] to schedule an appointment or [phone] for general information.    Tobacco Cessation Medications  Nicotine Replacement Therapy (NRT)  Nicotine is the addictive part of tobacco smoke, but not the most dangerous part. There are 7000 other toxins in cigarettes, including carbon monoxide, that cause disease. People do not generally become addicted to medication. Common problems: People don't use enough medication or stop too early. Medications are safe and effective. Overdose is very uncommon. Use medications as long as needed (3 months minimum). Some combinations work better than single medications. Long acting medications like the NRT patch and bupropion provide continuous treatment for withdrawal symptoms.  PLUS  Short acting medications like the NRT gum, lozenge, inhaler, and nasal spray help people to cope with breakthrough cravings.  ? Nicotine Patch  Place patch on hairless skin on upper body, including arms and back. Each day: discard old patch, shower, apply new patch to a different site. Apply hydrocortisone cream to mildly red/irritated areas. Call provider if rash develops. If patch causes sleep disturbance, remove patch  at bedtime and replace each morning after shower. Side effects may include: skin irritation, headache, insomnia, abnormal/vivid dreams.   ? Nicotine Lozenge  Allow to dissolve slowly in mouth (20-30 minutes). Do not chew or swallow. Nicotine release may cause a warm tingling sensation. Occasionally rotate to different areas of the mouth. Use enough to control cravings, up to 20 lozenges per day (if used alone). Avoid eating or drinking for 15 minutes before using and during use. Side effects may include: nausea, hiccups, cough, heartburn, headache, gas, insomnia. 

## 2022-06-14 NOTE — Telephone Encounter (Signed)
I spoke with [facility] and they did receive the prescriptions. I notified Dawn.  Nothing further is needed.

## 2022-06-20 ENCOUNTER — Encounter: Payer: Medicare HMO | Attending: Student in an Organized Health Care Education/Training Program | Admitting: *Deleted

## 2022-06-20 DIAGNOSIS — J439 Emphysema, unspecified: Secondary | ICD-10-CM | POA: Insufficient documentation

## 2022-06-20 DIAGNOSIS — Z5189 Encounter for other specified aftercare: Secondary | ICD-10-CM | POA: Insufficient documentation

## 2022-06-20 DIAGNOSIS — J432 Centrilobular emphysema: Secondary | ICD-10-CM | POA: Insufficient documentation

## 2022-06-20 NOTE — Progress Notes (Signed)
Initial phone call completed. Diagnosis can be found in [facility] 2/15. EP Orientation scheduled for Wednesday 2/28 at 1:30.  Bunny is a current tobacco user. Intervention for tobacco cessation was provided at the initial medical review. He was asked about readiness to quit and reported that he picked up the lozenges and patches today with the plan to try them out this week . Patient was advised and educated about tobacco cessation using combination therapy, tobacco cessation classes, quit line, and quit smoking apps. Patient demonstrated understanding of this material. Staff will continue to provide encouragement and follow up with the patient throughout the program.

## 2022-06-21 ENCOUNTER — Telehealth: Payer: Self-pay | Admitting: Student in an Organized Health Care Education/Training Program

## 2022-06-21 NOTE — Telephone Encounter (Signed)
Pt's wife states they received 6 weeks of tobacco patches. Box reads different. Wasn't sure if this stage is 4 wks or 6.

## 2022-06-21 NOTE — Telephone Encounter (Signed)
Spoke to patient's spouse, Dawn(DPR). She wanted to verify that patient should be taken nicotine patch '21mg'$  x42d per below instructions. She voiced her understanding.   Counseled Mr. Polit extensively on the importance of smoking cessation. I will provide him with prescriptions for nicotine patches and lozenges, as well as resources to help him in his journey towards abstinence.   - nicotine (NICODERM CQ - DOSED IN MG/24 HOURS) 21 mg/24hr patch; Place 1 patch (21 mg total) onto the skin daily.  Dispense: 42 patch; Refill: 0 - nicotine (NICODERM CQ - DOSED IN MG/24 HOURS) 14 mg/24hr patch; Place 1 patch (14 mg total) onto the skin daily for 14 days.  Dispense: 14 patch; Refill: 0 - nicotine (NICODERM CQ - DOSED IN MG/24 HR) 7 mg/24hr patch; Place 1 patch (7 mg total) onto the skin daily for 14 days.  Dispense: 14 patch; Refill: 0 - nicotine polacrilex (NICOTINE MINI) 2 MG lozenge; Take 1 lozenge (2 mg total) by mouth every 2 (two) hours as needed for smoking cessation.  Dispense: 72 lozenge; Refill: 3

## 2022-06-26 VITALS — Ht 66.5 in | Wt 158.0 lb

## 2022-06-26 DIAGNOSIS — Z5189 Encounter for other specified aftercare: Secondary | ICD-10-CM | POA: Diagnosis not present

## 2022-06-26 DIAGNOSIS — J439 Emphysema, unspecified: Secondary | ICD-10-CM | POA: Diagnosis not present

## 2022-06-26 DIAGNOSIS — J432 Centrilobular emphysema: Secondary | ICD-10-CM | POA: Diagnosis not present

## 2022-06-26 NOTE — Progress Notes (Signed)
Pulmonary Individual Treatment Plan  Patient Details  Name: Benjamin Torres MRN: [id number] Date of Birth: 1955/06/02 Referring Provider:   Flowsheet Row Pulmonary Rehab from 06/26/2022 in [facility] Cardiac and Pulmonary Rehab  Referring Provider Armando Reichert, MD       Initial Encounter Date:  Flowsheet Row Pulmonary Rehab from 06/26/2022 in [facility] Cardiac and Pulmonary Rehab  Date 06/26/22       Visit Diagnosis: Pulmonary emphysema, unspecified emphysema type ([place])  Patient's Home Medications on Admission:  Current Outpatient Medications:    acetaminophen-codeine (TYLENOL #3) 300-30 MG tablet, Take 1 tablet by mouth as needed., Disp: , Rfl:    albuterol (PROVENTIL HFA;VENTOLIN HFA) 108 (90 Base) MCG/ACT inhaler, Inhale 1 puff into the lungs every 4 (four) hours as needed for wheezing or shortness of breath., Disp: , Rfl:    cyanocobalamin (,VITAMIN B-12,) 1000 MCG/ML injection, Inject into the muscle., Disp: , Rfl:    gabapentin (NEURONTIN) 100 MG capsule, Take 100 mg by mouth daily., Disp: , Rfl:    ibuprofen (ADVIL) 200 MG tablet, Take 400 mg by mouth as needed for headache., Disp: , Rfl:    levothyroxine (SYNTHROID) 75 MCG tablet, Take by mouth., Disp: , Rfl:    metoprolol succinate (TOPROL-XL) 25 MG 24 hr tablet, Take 25 mg by mouth daily., Disp: , Rfl:    montelukast (SINGULAIR) 10 MG tablet, Take 10 mg by mouth daily., Disp: , Rfl:    nicotine (NICODERM CQ - DOSED IN MG/24 HOURS) 14 mg/24hr patch, Place 1 patch (14 mg total) onto the skin daily for 14 days., Disp: 14 patch, Rfl: 0   nicotine (NICODERM CQ - DOSED IN MG/24 HOURS) 21 mg/24hr patch, Place 1 patch (21 mg total) onto the skin daily., Disp: 42 patch, Rfl: 0   nicotine (NICODERM CQ - DOSED IN MG/24 HR) 7 mg/24hr patch, Place 1 patch (7 mg total) onto the skin daily for 14 days., Disp: 14 patch, Rfl: 0   nicotine polacrilex (NICOTINE MINI) 2 MG lozenge, Take 1 lozenge (2 mg total) by mouth every 2 (two) hours as needed for  smoking cessation., Disp: 72 lozenge, Rfl: 3   rosuvastatin (CRESTOR) 10 MG tablet, Take 10 mg by mouth at bedtime., Disp: , Rfl:    tamsulosin (FLOMAX) 0.4 MG CAPS capsule, Take 1 capsule (0.4 mg total) by mouth daily., Disp: 90 capsule, Rfl: 3   TRELEGY ELLIPTA 200-62.5-25 MCG/INH AEPB, Inhale 1 puff into the lungs daily at 2 PM. , Disp: , Rfl:  No current facility-administered medications for this visit.  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 06/13/2022.     Labs: Review Flowsheet       Latest Ref Rng & Units 08/10/2020  Labs for [facility]P Cardiac and Pulmonary Rehab  Hemoglobin  A1c 4.8 - 5.6 % 5.9      Pulmonary Assessment Scores:  Pulmonary Assessment Scores     Row Name 06/26/22 1453         ADL UCSD   ADL Phase Entry     SOB Score total 41     Rest 0     Walk 2     Stairs 3     Bath 2     Dress 1     Shop 2       CAT Score   CAT Score 19       mMRC Score   mMRC Score 3              UCSD: Self-administered rating of dyspnea associated with activities of daily living (ADLs) 6-point scale (0 = "not at all" to 5 = "maximal or unable to do because of breathlessness")  Scoring Scores range from 0 to 120.  Minimally important difference is 5 units  CAT: CAT can identify the health impairment of COPD  patients and is better correlated with disease progression.  CAT has a scoring range of zero to 40. The CAT score is classified into four groups of low (less than 10), medium (10 - 20), high (21-30) and very high (31-40) based on the impact level of disease on health status. A CAT score over 10 suggests significant symptoms.  A worsening CAT score could be explained by an exacerbation, poor medication adherence, poor inhaler technique, or progression of COPD or comorbid conditions.  CAT MCID is 2 points  mMRC: mMRC (Modified Medical Research Council) Dyspnea Scale is used to assess the degree of baseline functional disability in patients of respiratory disease due to dyspnea. No minimal important difference is established. A decrease in score of 1 point or greater is considered a positive change.   Pulmonary Function Assessment:   Exercise Target Goals: Exercise Program Goal: Individual exercise prescription set using results from initial 6 min walk test and THRR while considering  patient's activity barriers and safety.   Exercise Prescription Goal: Initial exercise prescription builds to 30-45 minutes a day of aerobic activity, 2-3 days per week.  Home exercise guidelines will be given to patient during program as part of exercise prescription that the participant will acknowledge.  Education: Aerobic Exercise: - Group verbal and visual presentation on the components of exercise prescription. Introduces F.I.T.T principle from ACSM for exercise prescriptions.  Reviews F.I.T.T. principles of aerobic exercise including progression. Written material given at graduation.   Education: Resistance Exercise: - Group verbal and visual presentation on the components of exercise prescription. Introduces F.I.T.T principle from ACSM for exercise prescriptions  Reviews F.I.T.T. principles of resistance exercise including progression. Written material given at graduation.    Education: Exercise &  Equipment Safety: - Individual verbal instruction and demonstration of equipment use and safety with use of the equipment. Flowsheet Row Pulmonary Rehab from 06/26/2022 in [facility] Cardiac and Pulmonary Rehab  Date 06/26/22  Educator NT  Instruction Review Code 1- Verbalizes Understanding       Education: Exercise Physiology & General Exercise Guidelines: - Group verbal and written instruction with models to review the exercise physiology of the cardiovascular system and associated critical values. Provides general exercise guidelines with specific guidelines to those with heart or lung disease.    Education: Flexibility, Balance, Mind/Body Relaxation: - Group verbal and visual presentation with interactive activity on the components of exercise prescription. Introduces F.I.T.T principle from ACSM for exercise prescriptions. Reviews  F.I.T.T. principles of flexibility and balance exercise training including progression. Also discusses the mind body connection.  Reviews various relaxation techniques to help reduce and manage stress (i.e. Deep breathing, progressive muscle relaxation, and visualization). Balance handout provided to take home. Written material given at graduation.   Activity Barriers & Risk Stratification:  Activity Barriers & Cardiac Risk Stratification - 06/26/22 1510       Activity Barriers & Cardiac Risk Stratification   Activity Barriers Shortness of Breath   Hip pain            6 Minute Walk:  6 Minute Walk     Row Name 06/26/22 1507         6 Minute Walk   Phase Initial     Distance 700 feet     Walk Time 6 minutes     # of Rest Breaks 0     MPH 1.33     METS 2.05     RPE 12     Perceived Dyspnea  2     VO2 Peak 7.19     Symptoms Yes (comment)     Comments SOB     Resting HR 72 bpm     Resting BP 112/58     Resting Oxygen Saturation  95 %     Exercise Oxygen Saturation  during 6 min walk 90 %     Max Ex. HR 93 bpm     Max Ex. BP 128/66     2  Minute Post BP 116/62       Interval HR   1 Minute HR 87     2 Minute HR 92     3 Minute HR 92     4 Minute HR 92     5 Minute HR 93     6 Minute HR 90     2 Minute Post HR 78     Interval Heart Rate? Yes       Interval Oxygen   Interval Oxygen? Yes     Baseline Oxygen Saturation % 95 %     1 Minute Oxygen Saturation % 93 %     1 Minute Liters of Oxygen 0 L  RA     2 Minute Oxygen Saturation % 93 %     2 Minute Liters of Oxygen 0 L  RA     3 Minute Oxygen Saturation % 92 %     3 Minute Liters of Oxygen 0 L  RA     4 Minute Oxygen Saturation % 92 %     4 Minute Liters of Oxygen 0 L  RA     5 Minute Oxygen Saturation % 90 %     5 Minute Liters of Oxygen 0 L  RA     6 Minute Oxygen Saturation % 91 %     6 Minute Liters of Oxygen 0 L  RA     2 Minute Post Oxygen Saturation % 95 %     2 Minute Post Liters of Oxygen 0 L  RA             Oxygen Initial Assessment:  Oxygen Initial Assessment - 06/20/22 1333       Home Oxygen   Home Oxygen Device None    Sleep Oxygen Prescription None    Home Exercise Oxygen Prescription None    Home Resting Oxygen Prescription None      Intervention   Short Term Goals To learn and  understand importance of monitoring SPO2 with pulse oximeter and demonstrate accurate use of the pulse oximeter.;To learn and understand importance of maintaining oxygen saturations>88%;To learn and demonstrate proper pursed lip breathing techniques or other breathing techniques. ;To learn and demonstrate proper use of respiratory medications    Long  Term Goals Verbalizes importance of monitoring SPO2 with pulse oximeter and return demonstration;Maintenance of O2 saturations>88%;Exhibits proper breathing techniques, such as pursed lip breathing or other method taught during program session;Compliance with respiratory medication             Oxygen Re-Evaluation:   Oxygen Discharge (Final Oxygen Re-Evaluation):   Initial Exercise Prescription:  Initial  Exercise Prescription - 06/26/22 1500       Date of Initial Exercise RX and Referring Provider   Date 06/26/22    Referring Provider Armando Reichert, MD      Oxygen   Maintain Oxygen Saturation 88% or higher      Recumbant Bike   Level 2    RPM 50    Watts 15    Minutes 15    METs 2.05      REL-XR   Level 1    Speed 50    Minutes 15    METs 2.05      T5 Nustep   Level 1    SPM 80    Minutes 15    METs 2.05      Track   Laps 19    Minutes 15    METs 2.03      Prescription Details   Frequency (times per week) 2    Duration Progress to 30 minutes of continuous aerobic without signs/symptoms of physical distress      Intensity   THRR 40-80% of Max Heartrate 104-136    Ratings of Perceived Exertion 11-13    Perceived Dyspnea 0-4      Progression   Progression Continue to progress workloads to maintain intensity without signs/symptoms of physical distress.      Resistance Training   Training Prescription Yes    Weight 3 lb    Reps 10-15             Perform Capillary Blood Glucose checks as needed.  Exercise Prescription Changes:   Exercise Prescription Changes     Row Name 06/26/22 1500             Response to Exercise   Blood Pressure (Admit) 112/58       Blood Pressure (Exercise) 128/66       Blood Pressure (Exit) 116/62       Heart Rate (Admit) 72 bpm       Heart Rate (Exercise) 93 bpm       Heart Rate (Exit) 78 bpm       Oxygen Saturation (Admit) 95 %       Oxygen Saturation (Exercise) 90 %       Oxygen Saturation (Exit) 95 %       Rating of Perceived Exertion (Exercise) 12       Perceived Dyspnea (Exercise) 2       Symptoms SOB       Comments 6MWT Results                Exercise Comments:   Exercise Goals and Review:   Exercise Goals     Row Name 06/26/22 1510             Exercise Goals   Increase Physical Activity Yes  Intervention Provide advice, education, support and counseling about physical  activity/exercise needs.;Develop an individualized exercise prescription for aerobic and resistive training based on initial evaluation findings, risk stratification, comorbidities and participant's personal goals.       Expected Outcomes Short Term: Attend rehab on a regular basis to increase amount of physical activity.;Long Term: Add in home exercise to make exercise part of routine and to increase amount of physical activity.;Long Term: Exercising regularly at least 3-5 days a week.       Increase Strength and Stamina Yes       Intervention Provide advice, education, support and counseling about physical activity/exercise needs.;Develop an individualized exercise prescription for aerobic and resistive training based on initial evaluation findings, risk stratification, comorbidities and participant's personal goals.       Expected Outcomes Short Term: Increase workloads from initial exercise prescription for resistance, speed, and METs.;Short Term: Perform resistance training exercises routinely during rehab and add in resistance training at home;Long Term: Improve cardiorespiratory fitness, muscular endurance and strength as measured by increased METs and functional capacity (6MWT)       Able to understand and use rate of perceived exertion (RPE) scale Yes       Intervention Provide education and explanation on how to use RPE scale       Expected Outcomes Short Term: Able to use RPE daily in rehab to express subjective intensity level;Long Term:  Able to use RPE to guide intensity level when exercising independently       Able to understand and use Dyspnea scale Yes       Intervention Provide education and explanation on how to use Dyspnea scale       Expected Outcomes Short Term: Able to use Dyspnea scale daily in rehab to express subjective sense of shortness of breath during exertion;Long Term: Able to use Dyspnea scale to guide intensity level when exercising independently       Knowledge and  understanding of Target Heart Rate Range (THRR) Yes       Intervention Provide education and explanation of THRR including how the numbers were predicted and where they are located for reference       Expected Outcomes Short Term: Able to state/look up THRR;Long Term: Able to use THRR to govern intensity when exercising independently;Short Term: Able to use daily as guideline for intensity in rehab       Able to check pulse independently Yes       Intervention Provide education and demonstration on how to check pulse in carotid and radial arteries.;Review the importance of being able to check your own pulse for safety during independent exercise       Expected Outcomes Short Term: Able to explain why pulse checking is important during independent exercise;Long Term: Able to check pulse independently and accurately       Understanding of Exercise Prescription Yes       Intervention Provide education, explanation, and written materials on patient's individual exercise prescription       Expected Outcomes Short Term: Able to explain program exercise prescription;Long Term: Able to explain home exercise prescription to exercise independently                Exercise Goals Re-Evaluation :   Discharge Exercise Prescription (Final Exercise Prescription Changes):  Exercise Prescription Changes - 06/26/22 1500       Response to Exercise   Blood Pressure (Admit) 112/58    Blood Pressure (Exercise) 128/66    Blood Pressure (  Exit) 116/62    Heart Rate (Admit) 72 bpm    Heart Rate (Exercise) 93 bpm    Heart Rate (Exit) 78 bpm    Oxygen Saturation (Admit) 95 %    Oxygen Saturation (Exercise) 90 %    Oxygen Saturation (Exit) 95 %    Rating of Perceived Exertion (Exercise) 12    Perceived Dyspnea (Exercise) 2    Symptoms SOB    Comments 6MWT Results             Nutrition:  Target Goals: Understanding of nutrition guidelines, daily intake of sodium '1500mg'$ , cholesterol '200mg'$ , calories 30%  from fat and 7% or less from saturated fats, daily to have 5 or more servings of fruits and vegetables.  Education: All About Nutrition: -Group instruction provided by verbal, written material, interactive activities, discussions, models, and posters to present general guidelines for heart healthy nutrition including fat, fiber, MyPlate, the role of sodium in heart healthy nutrition, utilization of the nutrition label, and utilization of this knowledge for meal planning. Follow up email sent as well. Written material given at graduation.   Biometrics:  Pre Biometrics - 06/26/22 1511       Pre Biometrics   Height 5' 6.5" (1.689 m)    Weight 158 lb (71.7 kg)    Waist Circumference 35 inches    Hip Circumference 37 inches    Waist to Hip Ratio 0.95 %    BMI (Calculated) 25.12    Single Leg Stand 14.2 seconds              Nutrition Therapy Plan and Nutrition Goals:  Nutrition Therapy & Goals - 06/26/22 1455       Intervention Plan   Intervention Prescribe, educate and counsel regarding individualized specific dietary modifications aiming towards targeted core components such as weight, hypertension, lipid management, diabetes, heart failure and other comorbidities.    Expected Outcomes Short Term Goal: A plan has been developed with personal nutrition goals set during dietitian appointment.;Short Term Goal: Understand basic principles of dietary content, such as calories, fat, sodium, cholesterol and nutrients.;Long Term Goal: Adherence to prescribed nutrition plan.             Nutrition Assessments:  MEDIFICTS Score Key: ?70 Need to make dietary changes  40-70 Heart Healthy Diet ? 40 Therapeutic Level Cholesterol Diet  Flowsheet Row Pulmonary Rehab from 06/26/2022 in [facility] Cardiac and Pulmonary Rehab  Picture Your Plate Total Score on Admission 36      Picture Your Plate Scores: [id number] Unhealthy dietary pattern with much room for improvement. 41-50 Dietary pattern  unlikely to meet recommendations for good health and room for improvement. 51-60 More healthful dietary pattern, with some room for improvement.  >60 Healthy dietary pattern, although there may be some specific behaviors that could be improved.   Nutrition Goals Re-Evaluation:   Nutrition Goals Discharge (Final Nutrition Goals Re-Evaluation):   Psychosocial: Target Goals: Acknowledge presence or absence of significant depression and/or stress, maximize coping skills, provide positive support system. Participant is able to verbalize types and ability to use techniques and skills needed for reducing stress and depression.   Education: Stress, Anxiety, and Depression - Group verbal and visual presentation to define topics covered.  Reviews how body is impacted by stress, anxiety, and depression.  Also discusses healthy ways to reduce stress and to treat/manage anxiety and depression.  Written material given at graduation.   Education: Sleep Hygiene -Provides group verbal and written instruction about how sleep can affect  your health.  Define sleep hygiene, discuss sleep cycles and impact of sleep habits. Review good sleep hygiene tips.    Initial Review & Psychosocial Screening:  Initial Psych Review & Screening - 06/20/22 1336       Initial Review   Current issues with None Identified      Family Dynamics   Good Support System? Yes   wife     Barriers   Psychosocial barriers to participate in program There are no identifiable barriers or psychosocial needs.;The patient should benefit from training in stress management and relaxation.      Screening Interventions   Interventions Encouraged to exercise;Provide feedback about the scores to participant;To provide support and resources with identified psychosocial needs    Expected Outcomes Short Term goal: Utilizing psychosocial counselor, staff and physician to assist with identification of specific Stressors or current issues  interfering with healing process. Setting desired goal for each stressor or current issue identified.;Long Term Goal: Stressors or current issues are controlled or eliminated.;Short Term goal: Identification and review with participant of any Quality of Life or Depression concerns found by scoring the questionnaire.;Long Term goal: The participant improves quality of Life and PHQ9 Scores as seen by post scores and/or verbalization of changes             Quality of Life Scores:  Scores of 19 and below usually indicate a poorer quality of life in these areas.  A difference of  2-3 points is a clinically meaningful difference.  A difference of 2-3 points in the total score of the Quality of Life Index has been associated with significant improvement in overall quality of life, self-image, physical symptoms, and general health in studies assessing change in quality of life.  PHQ-9: Review Flowsheet       06/26/2022  Depression screen PHQ 2/9  Decreased Interest 0  Down, Depressed, Hopeless 0  PHQ - 2 Score 0  Altered sleeping 0  Tired, decreased energy 1  Change in appetite 0  Feeling bad or failure about yourself  0  Trouble concentrating 0  Moving slowly or fidgety/restless 0  Suicidal thoughts 0  PHQ-9 Score 1  Difficult doing work/chores Not difficult at all   Interpretation of Total Score  Total Score Depression Severity:  1-4 = Minimal depression, 5-9 = Mild depression, 10-14 = Moderate depression, 15-19 = Moderately severe depression, 20-27 = Severe depression   Psychosocial Evaluation and Intervention:  Psychosocial Evaluation - 06/20/22 1343       Psychosocial Evaluation & Interventions   Interventions Encouraged to exercise with the program and follow exercise prescription    Comments Ellias is coming to Pulmonary Rehab for emphysema. He has noticed his breathing has gotten worse over the last few months, example being that now he has to catch his breath at the top of  his three step entry. He is tired of feeling short of breath all the time. He wants to quit smoking and picked up the lozenges and patches this week. He was informed about the lifestyle aspect of quitting and is thankful for the help. He does not report any current stress concerns. His wife is his main support system.    Expected Outcomes Short: attend pulmonary rehab for education and exercise. Long: develop and maintain positive self care habits.    Continue Psychosocial Services  Follow up required by staff             Psychosocial Re-Evaluation:   Psychosocial Discharge (Final Psychosocial Re-Evaluation):  Education: Education Goals: Education classes will be provided on a weekly basis, covering required topics. Participant will state understanding/return demonstration of topics presented.  Learning Barriers/Preferences:  Learning Barriers/Preferences - 06/20/22 1336       Learning Barriers/Preferences   Learning Barriers None    Learning Preferences Individual Instruction             General Pulmonary Education Topics:  Infection Prevention: - Provides verbal and written material to individual with discussion of infection control including proper hand washing and proper equipment cleaning during exercise session. Flowsheet Row Pulmonary Rehab from 06/26/2022 in [facility] Cardiac and Pulmonary Rehab  Date 06/26/22  Educator NT  Instruction Review Code 1- Verbalizes Understanding       Falls Prevention: - Provides verbal and written material to individual with discussion of falls prevention and safety. Flowsheet Row Pulmonary Rehab from 06/26/2022 in [facility] Cardiac and Pulmonary Rehab  Date 06/26/22  Educator NT  Instruction Review Code 1- Verbalizes Understanding       Chronic Lung Disease Review: - Group verbal instruction with posters, models, PowerPoint presentations and videos,  to review new updates, new respiratory medications, new advancements in procedures  and treatments. Providing information on websites and "800" numbers for continued self-education. Includes information about supplement oxygen, available portable oxygen systems, continuous and intermittent flow rates, oxygen safety, concentrators, and Medicare reimbursement for oxygen. Explanation of Pulmonary Drugs, including class, frequency, complications, importance of spacers, rinsing mouth after steroid MDI's, and proper cleaning methods for nebulizers. Review of basic lung anatomy and physiology related to function, structure, and complications of lung disease. Review of risk factors. Discussion about methods for diagnosing sleep apnea and types of masks and machines for OSA. Includes a review of the use of types of environmental controls: home humidity, furnaces, filters, dust mite/pet prevention, HEPA vacuums. Discussion about weather changes, air quality and the benefits of nasal washing. Instruction on Warning signs, infection symptoms, calling MD promptly, preventive modes, and value of vaccinations. Review of effective airway clearance, coughing and/or vibration techniques. Emphasizing that all should Create an Action Plan. Written material given at graduation.   AED/CPR: - Group verbal and written instruction with the use of models to demonstrate the basic use of the AED with the basic ABC's of resuscitation.    Anatomy and Cardiac Procedures: - Group verbal and visual presentation and models provide information about basic cardiac anatomy and function. Reviews the testing methods done to diagnose heart disease and the outcomes of the test results. Describes the treatment choices: Medical Management, Angioplasty, or Coronary Bypass Surgery for treating various heart conditions including Myocardial Infarction, Angina, Valve Disease, and Cardiac Arrhythmias.  Written material given at graduation.   Medication Safety: - Group verbal and visual instruction to review commonly prescribed  medications for heart and lung disease. Reviews the medication, class of the drug, and side effects. Includes the steps to properly store meds and maintain the prescription regimen.  Written material given at graduation.   Other: -Provides group and verbal instruction on various topics (see comments)   Knowledge Questionnaire Score:  Knowledge Questionnaire Score - 06/26/22 1455       Knowledge Questionnaire Score   Pre Score 16/18              Core Components/Risk Factors/Patient Goals at Admission:  Personal Goals and Risk Factors at Admission - 06/26/22 1457       Core Components/Risk Factors/Patient Goals on Admission    Weight Management Yes;Weight Maintenance  Intervention Weight Management: Develop a combined nutrition and exercise program designed to reach desired caloric intake, while maintaining appropriate intake of nutrient and fiber, sodium and fats, and appropriate energy expenditure required for the weight goal.;Weight Management: Provide education and appropriate resources to help participant work on and attain dietary goals.    Admit Weight 158 lb (71.7 kg)    Goal Weight: Long Term 160 lb (72.6 kg)    Expected Outcomes Long Term: Adherence to nutrition and physical activity/exercise program aimed toward attainment of established weight goal;Short Term: Continue to assess and modify interventions until short term weight is achieved;Weight Maintenance: Understanding of the daily nutrition guidelines, which includes 25-35% calories from fat, 7% or less cal from saturated fats, less than '200mg'$  cholesterol, less than 1.5gm of sodium, & 5 or more servings of fruits and vegetables daily;Understanding recommendations for meals to include 15-35% energy as protein, 25-35% energy from fat, 35-60% energy from carbohydrates, less than '200mg'$  of dietary cholesterol, 20-35 gm of total fiber daily;Understanding of distribution of calorie intake throughout the day with the consumption  of 4-5 meals/snacks    Tobacco Cessation Yes    Number of packs per day 1    Intervention Assist the participant in steps to quit. Provide individualized education and counseling about committing to Tobacco Cessation, relapse prevention, and pharmacological support that can be provided by physician.;Advice worker, assist with locating and accessing local/national Quit Smoking programs, and support quit date choice.    Expected Outcomes Short Term: Will demonstrate readiness to quit, by selecting a quit date.;Short Term: Will quit all tobacco product use, adhering to prevention of relapse plan.;Long Term: Complete abstinence from all tobacco products for at least 12 months from quit date.    Improve shortness of breath with ADL's Yes    Intervention Provide education, individualized exercise plan and daily activity instruction to help decrease symptoms of SOB with activities of daily living.    Expected Outcomes Short Term: Improve cardiorespiratory fitness to achieve a reduction of symptoms when performing ADLs;Long Term: Be able to perform more ADLs without symptoms or delay the onset of symptoms    Lipids Yes    Intervention Provide education and support for participant on nutrition & aerobic/resistive exercise along with prescribed medications to achieve LDL '70mg'$ , HDL >'40mg'$ .    Expected Outcomes Short Term: Participant states understanding of desired cholesterol values and is compliant with medications prescribed. Participant is following exercise prescription and nutrition guidelines.;Long Term: Cholesterol controlled with medications as prescribed, with individualized exercise RX and with personalized nutrition plan. Value goals: LDL < '70mg'$ , HDL > 40 mg.             Education:Diabetes - Individual verbal and written instruction to review signs/symptoms of diabetes, desired ranges of glucose level fasting, after meals and with exercise. Acknowledge that pre and post exercise  glucose checks will be done for 3 sessions at entry of program.   Know Your Numbers and Heart Failure: - Group verbal and visual instruction to discuss disease risk factors for cardiac and pulmonary disease and treatment options.  Reviews associated critical values for Overweight/Obesity, Hypertension, Cholesterol, and Diabetes.  Discusses basics of heart failure: signs/symptoms and treatments.  Introduces Heart Failure Zone chart for action plan for heart failure.  Written material given at graduation. Flowsheet Row Pulmonary Rehab from 06/26/2022 in [facility] Cardiac and Pulmonary Rehab  Education need identified 06/26/22       Core Components/Risk Factors/Patient Goals Review:    Core Components/Risk Factors/Patient  Goals at Discharge (Final Review):    ITP Comments:  ITP Comments     Row Name 06/20/22 1343 06/26/22 1501         ITP Comments Initial phone call completed. Diagnosis can be found in [facility] 2/15. EP Orientation scheduled for Wednesday 2/28 at 1:30.    Maximillian is a current tobacco user. Intervention for tobacco cessation was provided at the initial medical review. He was asked about readiness to quit and reported that he picked up the lozenges and patches today with the plan to try them out this week . Patient was advised and educated about tobacco cessation using combination therapy, tobacco cessation classes, quit line, and quit smoking apps. Patient demonstrated understanding of this material. Staff will continue to provide encouragement and follow up with the patient throughout the program. Completed 6MWT and gym orientation. Initial ITP created and sent for review to Dr. Ottie Glazier, Medical Director.               Comments: Initial ITP

## 2022-06-26 NOTE — Patient Instructions (Signed)
Patient Instructions  Patient Details  Name: Benjamin Torres MRN: [id number] Date of Birth: 07/21/1955 Referring Provider:  Armando Reichert, MD  Below are your personal goals for exercise, nutrition, and risk factors. Our goal is to help you stay on track towards obtaining and maintaining these goals. We will be discussing your progress on these goals with you throughout the program.  Initial Exercise Prescription:  Initial Exercise Prescription - 06/26/22 1500       Date of Initial Exercise RX and Referring Provider   Date 06/26/22    Referring Provider Armando Reichert, MD      Oxygen   Maintain Oxygen Saturation 88% or higher      Recumbant Bike   Level 2    RPM 50    Watts 15    Minutes 15    METs 2.05      REL-XR   Level 1    Speed 50    Minutes 15    METs 2.05      T5 Nustep   Level 1    SPM 80    Minutes 15    METs 2.05      Track   Laps 19    Minutes 15    METs 2.03      Prescription Details   Frequency (times per week) 2    Duration Progress to 30 minutes of continuous aerobic without signs/symptoms of physical distress      Intensity   THRR 40-80% of Max Heartrate 104-136    Ratings of Perceived Exertion 11-13    Perceived Dyspnea 0-4      Progression   Progression Continue to progress workloads to maintain intensity without signs/symptoms of physical distress.      Resistance Training   Training Prescription Yes    Weight 3 lb    Reps 10-15             Exercise Goals: Frequency: Be able to perform aerobic exercise two to three times per week in program working toward 2-5 days per week of home exercise.  Intensity: Work with a perceived exertion of 11 (fairly light) - 15 (hard) while following your exercise prescription.  We will make changes to your prescription with you as you progress through the program.   Duration: Be able to do 30 to 45 minutes of continuous aerobic exercise in addition to a 5 minute warm-up and a 5 minute cool-down  routine.   Nutrition Goals: Your personal nutrition goals will be established when you do your nutrition analysis with the dietician.  The following are general nutrition guidelines to follow: Cholesterol < '200mg'$ /day Sodium < '1500mg'$ /day Fiber: Men over 50 yrs - 30 grams per day  Personal Goals:  Personal Goals and Risk Factors at Admission - 06/26/22 1457       Core Components/Risk Factors/Patient Goals on Admission    Weight Management Yes;Weight Maintenance    Intervention Weight Management: Develop a combined nutrition and exercise program designed to reach desired caloric intake, while maintaining appropriate intake of nutrient and fiber, sodium and fats, and appropriate energy expenditure required for the weight goal.;Weight Management: Provide education and appropriate resources to help participant work on and attain dietary goals.    Admit Weight 158 lb (71.7 kg)    Goal Weight: Long Term 160 lb (72.6 kg)    Expected Outcomes Long Term: Adherence to nutrition and physical activity/exercise program aimed toward attainment of established weight goal;Short Term: Continue to assess and modify interventions  until short term weight is achieved;Weight Maintenance: Understanding of the daily nutrition guidelines, which includes 25-35% calories from fat, 7% or less cal from saturated fats, less than '200mg'$  cholesterol, less than 1.5gm of sodium, & 5 or more servings of fruits and vegetables daily;Understanding recommendations for meals to include 15-35% energy as protein, 25-35% energy from fat, 35-60% energy from carbohydrates, less than '200mg'$  of dietary cholesterol, 20-35 gm of total fiber daily;Understanding of distribution of calorie intake throughout the day with the consumption of 4-5 meals/snacks    Tobacco Cessation Yes    Number of packs per day 1    Intervention Assist the participant in steps to quit. Provide individualized education and counseling about committing to Tobacco Cessation,  relapse prevention, and pharmacological support that can be provided by physician.;Advice worker, assist with locating and accessing local/national Quit Smoking programs, and support quit date choice.    Expected Outcomes Short Term: Will demonstrate readiness to quit, by selecting a quit date.;Short Term: Will quit all tobacco product use, adhering to prevention of relapse plan.;Long Term: Complete abstinence from all tobacco products for at least 12 months from quit date.    Improve shortness of breath with ADL's Yes    Intervention Provide education, individualized exercise plan and daily activity instruction to help decrease symptoms of SOB with activities of daily living.    Expected Outcomes Short Term: Improve cardiorespiratory fitness to achieve a reduction of symptoms when performing ADLs;Long Term: Be able to perform more ADLs without symptoms or delay the onset of symptoms    Lipids Yes    Intervention Provide education and support for participant on nutrition & aerobic/resistive exercise along with prescribed medications to achieve LDL '70mg'$ , HDL >'40mg'$ .    Expected Outcomes Short Term: Participant states understanding of desired cholesterol values and is compliant with medications prescribed. Participant is following exercise prescription and nutrition guidelines.;Long Term: Cholesterol controlled with medications as prescribed, with individualized exercise RX and with personalized nutrition plan. Value goals: LDL < '70mg'$ , HDL > 40 mg.             Tobacco Use Initial Evaluation: Social History   Tobacco Use  Smoking Status Every Day   Packs/day: 1.50   Years: 40.00   Total pack years: 60.00   Types: E-cigarettes, Cigarettes   Last attempt to quit: 2015   Years since quitting: 9.1  Smokeless Tobacco Never  Tobacco Comments   Smokes 12-15 cigarettes daily. 06/13/2022.     Exercise Goals and Review:  Exercise Goals     Row Name 06/26/22 1510              Exercise Goals   Increase Physical Activity Yes       Intervention Provide advice, education, support and counseling about physical activity/exercise needs.;Develop an individualized exercise prescription for aerobic and resistive training based on initial evaluation findings, risk stratification, comorbidities and participant's personal goals.       Expected Outcomes Short Term: Attend rehab on a regular basis to increase amount of physical activity.;Long Term: Add in home exercise to make exercise part of routine and to increase amount of physical activity.;Long Term: Exercising regularly at least 3-5 days a week.       Increase Strength and Stamina Yes       Intervention Provide advice, education, support and counseling about physical activity/exercise needs.;Develop an individualized exercise prescription for aerobic and resistive training based on initial evaluation findings, risk stratification, comorbidities and participant's personal goals.  Expected Outcomes Short Term: Increase workloads from initial exercise prescription for resistance, speed, and METs.;Short Term: Perform resistance training exercises routinely during rehab and add in resistance training at home;Long Term: Improve cardiorespiratory fitness, muscular endurance and strength as measured by increased METs and functional capacity (6MWT)       Able to understand and use rate of perceived exertion (RPE) scale Yes       Intervention Provide education and explanation on how to use RPE scale       Expected Outcomes Short Term: Able to use RPE daily in rehab to express subjective intensity level;Long Term:  Able to use RPE to guide intensity level when exercising independently       Able to understand and use Dyspnea scale Yes       Intervention Provide education and explanation on how to use Dyspnea scale       Expected Outcomes Short Term: Able to use Dyspnea scale daily in rehab to express subjective sense of shortness of breath  during exertion;Long Term: Able to use Dyspnea scale to guide intensity level when exercising independently       Knowledge and understanding of Target Heart Rate Range (THRR) Yes       Intervention Provide education and explanation of THRR including how the numbers were predicted and where they are located for reference       Expected Outcomes Short Term: Able to state/look up THRR;Long Term: Able to use THRR to govern intensity when exercising independently;Short Term: Able to use daily as guideline for intensity in rehab       Able to check pulse independently Yes       Intervention Provide education and demonstration on how to check pulse in carotid and radial arteries.;Review the importance of being able to check your own pulse for safety during independent exercise       Expected Outcomes Short Term: Able to explain why pulse checking is important during independent exercise;Long Term: Able to check pulse independently and accurately       Understanding of Exercise Prescription Yes       Intervention Provide education, explanation, and written materials on patient's individual exercise prescription       Expected Outcomes Short Term: Able to explain program exercise prescription;Long Term: Able to explain home exercise prescription to exercise independently

## 2022-07-01 ENCOUNTER — Encounter: Payer: Medicare HMO | Attending: Student in an Organized Health Care Education/Training Program | Admitting: *Deleted

## 2022-07-01 DIAGNOSIS — J439 Emphysema, unspecified: Secondary | ICD-10-CM | POA: Insufficient documentation

## 2022-07-01 DIAGNOSIS — Z5189 Encounter for other specified aftercare: Secondary | ICD-10-CM | POA: Insufficient documentation

## 2022-07-01 NOTE — Progress Notes (Signed)
Daily Session Note  Patient Details  Name: Benjamin Torres MRN: [id number] Date of Birth: 1956-02-08 Referring Provider:   Flowsheet Row Pulmonary Rehab from 06/26/2022 in [facility] Cardiac and Pulmonary Rehab  Referring Provider Armando Reichert, MD       Encounter Date: 07/01/2022  Check In:  Session Check In - 07/01/22 1540       Check-In   Supervising physician immediately available to respond to emergencies See telemetry face sheet for immediately available ER MD    Location [facility]-Cardiac & Pulmonary Rehab    Staff Present Renita Papa, RN BSN;Joseph [place], RCP,RRT,BSRT;Noah [place], [place], Exercise Physiologist    Virtual Visit No    Medication changes reported     No    Fall or balance concerns reported    No    Tobacco Cessation Use Increase    Current number of cigarettes/nicotine per day     3    Warm-up and Cool-down Performed on first and last piece of equipment    Resistance Training Performed Yes    VAD Patient? No    PAD/SET Patient? No      Pain Assessment   Currently in Pain? No/denies                Social History   Tobacco Use  Smoking Status Every Day   Packs/day: 1.50   Years: 40.00   Total pack years: 60.00   Types: E-cigarettes, Cigarettes   Last attempt to quit: 2015   Years since quitting: 9.1  Smokeless Tobacco Never  Tobacco Comments   Smokes 12-15 cigarettes daily. 06/13/2022.     Goals Met:  Independence with exercise equipment Exercise tolerated well No report of concerns or symptoms today Strength training completed today  Goals Unmet:  Not Applicable  Comments: First full day of exercise!  Patient was oriented to gym and equipment including functions, settings, policies, and procedures.  Patient's individual exercise prescription and treatment plan were reviewed.  All starting workloads were established based on the results of the 6 minute walk test done at initial orientation visit.  The plan for exercise progression was also  introduced and progression will be customized based on patient's performance and goals.    Dr. Emily Filbert is Medical Director for [place].  Dr. Ottie Glazier is Medical Director for [facility] Pulmonary Rehabilitation.

## 2022-07-03 ENCOUNTER — Encounter: Payer: Medicare HMO | Admitting: *Deleted

## 2022-07-03 DIAGNOSIS — J439 Emphysema, unspecified: Secondary | ICD-10-CM | POA: Diagnosis not present

## 2022-07-03 DIAGNOSIS — Z5189 Encounter for other specified aftercare: Secondary | ICD-10-CM | POA: Diagnosis not present

## 2022-07-03 NOTE — Progress Notes (Signed)
Daily Session Note  Patient Details  Name: Benjamin Torres MRN: [id number] Date of Birth: 01/25/1956 Referring Provider:   Flowsheet Row Pulmonary Rehab from 06/26/2022 in [facility] Cardiac and Pulmonary Rehab  Referring Provider Armando Reichert, MD       Encounter Date: 07/03/2022  Check In:  Session Check In - 07/03/22 1540       Check-In   Supervising physician immediately available to respond to emergencies See telemetry face sheet for immediately available ER MD    Location [facility]-Cardiac & Pulmonary Rehab    Staff Present Renita Papa, RN Moises Blood, BS, ACSM CEP, Exercise Physiologist;Megan Tamala Julian, RN, ADN    Virtual Visit No    Medication changes reported     No    Fall or balance concerns reported    No    Tobacco Cessation No Change    Current number of cigarettes/nicotine per day     3    Warm-up and Cool-down Performed on first and last piece of equipment    Resistance Training Performed Yes    VAD Patient? No    PAD/SET Patient? No      Pain Assessment   Currently in Pain? No/denies                Social History   Tobacco Use  Smoking Status Every Day   Packs/day: 1.50   Years: 40.00   Total pack years: 60.00   Types: E-cigarettes, Cigarettes   Last attempt to quit: 2015   Years since quitting: 9.1  Smokeless Tobacco Never  Tobacco Comments   Smokes 12-15 cigarettes daily. 06/13/2022.     Goals Met:  Independence with exercise equipment Exercise tolerated well No report of concerns or symptoms today Strength training completed today  Goals Unmet:  Not Applicable  Comments: Pt able to follow exercise prescription today without complaint.  Will continue to monitor for progression.    Dr. Emily Filbert is Medical Director for [place].  Dr. Ottie Glazier is Medical Director for [facility] Pulmonary Rehabilitation.

## 2022-07-08 ENCOUNTER — Encounter: Payer: Medicare HMO | Admitting: *Deleted

## 2022-07-08 DIAGNOSIS — J439 Emphysema, unspecified: Secondary | ICD-10-CM | POA: Diagnosis not present

## 2022-07-08 DIAGNOSIS — Z5189 Encounter for other specified aftercare: Secondary | ICD-10-CM | POA: Diagnosis not present

## 2022-07-08 NOTE — Progress Notes (Signed)
Daily Session Note  Patient Details  Name: Benjamin Torres MRN: [id number] Date of Birth: 1955-08-05 Referring Provider:   Flowsheet Row Pulmonary Rehab from 06/26/2022 in [facility] Cardiac and Pulmonary Rehab  Referring Provider Armando Reichert, MD       Encounter Date: 07/08/2022  Check In:  Session Check In - 07/08/22 1630       Check-In   Supervising physician immediately available to respond to emergencies See telemetry face sheet for immediately available ER MD    Location [facility]-Cardiac & Pulmonary Rehab    Staff Present Darlyne Russian, RN, Lorin Mercy, MS, ACSM CEP, Exercise Physiologist;Noah Tickle, BS, Exercise Physiologist;Joseph Tessie Fass, [place]    Virtual Visit No    Medication changes reported     No    Fall or balance concerns reported    No    Tobacco Cessation Use Decreased    Current number of cigarettes/nicotine per day     0    Warm-up and Cool-down Performed on first and last piece of equipment    Resistance Training Performed Yes    VAD Patient? No    PAD/SET Patient? No      Pain Assessment   Currently in Pain? No/denies                Social History   Tobacco Use  Smoking Status Every Day   Packs/day: 1.50   Years: 40.00   Total pack years: 60.00   Types: E-cigarettes, Cigarettes   Last attempt to quit: 2015   Years since quitting: 9.1  Smokeless Tobacco Never  Tobacco Comments   Smokes 12-15 cigarettes daily. 06/13/2022.     Goals Met:  Independence with exercise equipment Exercise tolerated well No report of concerns or symptoms today Strength training completed today  Goals Unmet:  Not Applicable  Comments: Pt able to follow exercise prescription today without complaint.  Will continue to monitor for progression.    Dr. Emily Filbert is Medical Director for [name].  Dr. Ottie Glazier is Medical Director for [facility] Pulmonary Rehabilitation.

## 2022-07-15 ENCOUNTER — Encounter: Payer: Medicare HMO | Admitting: *Deleted

## 2022-07-15 DIAGNOSIS — J439 Emphysema, unspecified: Secondary | ICD-10-CM | POA: Diagnosis not present

## 2022-07-15 DIAGNOSIS — Z5189 Encounter for other specified aftercare: Secondary | ICD-10-CM | POA: Diagnosis not present

## 2022-07-15 NOTE — Progress Notes (Signed)
Daily Session Note  Patient Details  Name: Benjamin Torres MRN: [id number] Date of Birth: [birth year] Referring Provider:   Flowsheet Row Pulmonary Rehab from 06/26/2022 in [facility] Cardiac and Pulmonary Rehab  Referring Provider Armando Reichert, MD       Encounter Date: 07/15/2022  Check In:  Session Check In - 07/15/22 1541       Check-In   Supervising physician immediately available to respond to emergencies See telemetry face sheet for immediately available ER MD    Location [facility]-Cardiac & Pulmonary Rehab    Staff Present Renita Papa, RN BSN;Noah Tickle, BS, Exercise Physiologist;Joseph Tessie Fass, [place]    Virtual Visit No    Medication changes reported     No    Fall or balance concerns reported    No    Tobacco Cessation No Change    Current number of cigarettes/nicotine per day     0    Warm-up and Cool-down Performed on first and last piece of equipment    Resistance Training Performed Yes    VAD Patient? No    PAD/SET Patient? No      Pain Assessment   Currently in Pain? No/denies                Social History   Tobacco Use  Smoking Status Every Day   Packs/day: 1.50   Years: 40.00   Additional pack years: 0.00   Total pack years: 60.00   Types: E-cigarettes, Cigarettes   Last attempt to quit: 2015   Years since quitting: 9.2  Smokeless Tobacco Never  Tobacco Comments   Smokes 12-15 cigarettes daily. 06/13/2022.     Goals Met:  Independence with exercise equipment Exercise tolerated well No report of concerns or symptoms today Strength training completed today  Goals Unmet:  Not Applicable  Comments: Pt able to follow exercise prescription today without complaint.  Will continue to monitor for progression.    Dr. Emily Filbert is Medical Director for [place].  Dr. Ottie Glazier is Medical Director for [facility] Pulmonary Rehabilitation.

## 2022-07-17 ENCOUNTER — Encounter: Payer: Self-pay | Admitting: *Deleted

## 2022-07-17 DIAGNOSIS — J439 Emphysema, unspecified: Secondary | ICD-10-CM

## 2022-07-17 NOTE — Progress Notes (Signed)
Pulmonary Individual Treatment Plan  Patient Details  Name: ERBIE MAROTTA MRN: [id number] Date of Birth: Aug 30, 1955 Referring Provider:   Flowsheet Row Pulmonary Rehab from 06/26/2022 in [facility] Cardiac and Pulmonary Rehab  Referring Provider Armando Reichert, MD       Initial Encounter Date:  Flowsheet Row Pulmonary Rehab from 06/26/2022 in [facility] Cardiac and Pulmonary Rehab  Date 06/26/22       Visit Diagnosis: Pulmonary emphysema, unspecified emphysema type ([place])  Patient's Home Medications on Admission:  Current Outpatient Medications:    acetaminophen-codeine (TYLENOL #3) 300-30 MG tablet, Take 1 tablet by mouth as needed., Disp: , Rfl:    albuterol (PROVENTIL HFA;VENTOLIN HFA) 108 (90 Base) MCG/ACT inhaler, Inhale 1 puff into the lungs every 4 (four) hours as needed for wheezing or shortness of breath., Disp: , Rfl:    cyanocobalamin (,VITAMIN B-12,) 1000 MCG/ML injection, Inject into the muscle., Disp: , Rfl:    gabapentin (NEURONTIN) 100 MG capsule, Take 100 mg by mouth daily., Disp: , Rfl:    ibuprofen (ADVIL) 200 MG tablet, Take 400 mg by mouth as needed for headache., Disp: , Rfl:    levothyroxine (SYNTHROID) 75 MCG tablet, Take by mouth., Disp: , Rfl:    metoprolol succinate (TOPROL-XL) 25 MG 24 hr tablet, Take 25 mg by mouth daily., Disp: , Rfl:    montelukast (SINGULAIR) 10 MG tablet, Take 10 mg by mouth daily., Disp: , Rfl:    nicotine (NICODERM CQ - DOSED IN MG/24 HOURS) 21 mg/24hr patch, Place 1 patch (21 mg total) onto the skin daily., Disp: 42 patch, Rfl: 0   nicotine polacrilex (NICOTINE MINI) 2 MG lozenge, Take 1 lozenge (2 mg total) by mouth every 2 (two) hours as needed for smoking cessation., Disp: 72 lozenge, Rfl: 3   rosuvastatin (CRESTOR) 10 MG tablet, Take 10 mg by mouth at bedtime., Disp: , Rfl:    tamsulosin (FLOMAX) 0.4 MG CAPS capsule, Take 1 capsule (0.4 mg total) by mouth daily., Disp: 90 capsule, Rfl: 3   TRELEGY ELLIPTA 200-62.5-25 MCG/INH AEPB, Inhale  1 puff into the lungs daily at 2 PM. , Disp: , Rfl:  No current facility-administered medications for this visit.  Facility-Administered Medications Ordered in Other Visits:    sodium chloride flush (NS) 0.9 % injection 3 mL, 3 mL, Intravenous, Q12H, Fath, Javier Docker, MD  Past Medical History: Past Medical History:  Diagnosis Date   Cancer of larynx ([place]) 11/06/2014   COPD (chronic obstructive pulmonary disease) (HCC)    Hypothyroidism    PE (pulmonary thromboembolism) ([place])    right lung, over 10 yrs ago    Tobacco Use: Social History   Tobacco Use  Smoking Status Every Day   Packs/day: 1.50   Years: 40.00   Additional pack years: 0.00   Total pack years: 60.00   Types: E-cigarettes, Cigarettes   Last attempt to quit: 2015   Years since quitting: 9.2  Smokeless Tobacco Never  Tobacco Comments   Smokes 12-15 cigarettes daily. 06/13/2022.     Labs: Review Flowsheet       Latest Ref Rng & Units 08/10/2020  Labs for ITP Cardiac and Pulmonary Rehab  Hemoglobin A1c 4.8 - 5.6 % 5.9      Pulmonary Assessment Scores:  Pulmonary Assessment Scores     Row Name 06/26/22 1453         ADL UCSD   ADL Phase Entry     SOB Score total 41     Rest 0  Walk 2     Stairs 3     Bath 2     Dress 1     Shop 2       CAT Score   CAT Score 19       mMRC Score   mMRC Score 3              UCSD: Self-administered rating of dyspnea associated with activities of daily living (ADLs) 6-point scale (0 = "not at all" to 5 = "maximal or unable to do because of breathlessness")  Scoring Scores range from 0 to 120.  Minimally important difference is 5 units  CAT: CAT can identify the health impairment of COPD patients and is better correlated with disease progression.  CAT has a scoring range of zero to 40. The CAT score is classified into four groups of low (less than 10), medium (10 - 20), high (21-30) and very high (31-40) based on the impact level of disease on health  status. A CAT score over 10 suggests significant symptoms.  A worsening CAT score could be explained by an exacerbation, poor medication adherence, poor inhaler technique, or progression of COPD or comorbid conditions.  CAT MCID is 2 points  mMRC: mMRC (Modified Medical Research Council) Dyspnea Scale is used to assess the degree of baseline functional disability in patients of respiratory disease due to dyspnea. No minimal important difference is established. A decrease in score of 1 point or greater is considered a positive change.   Pulmonary Function Assessment:   Exercise Target Goals: Exercise Program Goal: Individual exercise prescription set using results from initial 6 min walk test and THRR while considering  patient's activity barriers and safety.   Exercise Prescription Goal: Initial exercise prescription builds to 30-45 minutes a day of aerobic activity, 2-3 days per week.  Home exercise guidelines will be given to patient during program as part of exercise prescription that the participant will acknowledge.  Education: Aerobic Exercise: - Group verbal and visual presentation on the components of exercise prescription. Introduces F.I.T.T principle from ACSM for exercise prescriptions.  Reviews F.I.T.T. principles of aerobic exercise including progression. Written material given at graduation.   Education: Resistance Exercise: - Group verbal and visual presentation on the components of exercise prescription. Introduces F.I.T.T principle from ACSM for exercise prescriptions  Reviews F.I.T.T. principles of resistance exercise including progression. Written material given at graduation.    Education: Exercise & Equipment Safety: - Individual verbal instruction and demonstration of equipment use and safety with use of the equipment. Flowsheet Row Pulmonary Rehab from 07/03/2022 in [facility] Cardiac and Pulmonary Rehab  Date 06/26/22  Educator NT  Instruction Review Code 1- Verbalizes  Understanding       Education: Exercise Physiology & General Exercise Guidelines: - Group verbal and written instruction with models to review the exercise physiology of the cardiovascular system and associated critical values. Provides general exercise guidelines with specific guidelines to those with heart or lung disease.    Education: Flexibility, Balance, Mind/Body Relaxation: - Group verbal and visual presentation with interactive activity on the components of exercise prescription. Introduces F.I.T.T principle from ACSM for exercise prescriptions. Reviews F.I.T.T. principles of flexibility and balance exercise training including progression. Also discusses the mind body connection.  Reviews various relaxation techniques to help reduce and manage stress (i.e. Deep breathing, progressive muscle relaxation, and visualization). Balance handout provided to take home. Written material given at graduation.   Activity Barriers & Risk Stratification:  Activity Barriers & Cardiac Risk  Stratification - 06/26/22 1510       Activity Barriers & Cardiac Risk Stratification   Activity Barriers Shortness of Breath   Hip pain            6 Minute Walk:  6 Minute Walk     Row Name 06/26/22 1507         6 Minute Walk   Phase Initial     Distance 700 feet     Walk Time 6 minutes     # of Rest Breaks 0     MPH 1.33     METS 2.05     RPE 12     Perceived Dyspnea  2     VO2 Peak 7.19     Symptoms Yes (comment)     Comments SOB     Resting HR 72 bpm     Resting BP 112/58     Resting Oxygen Saturation  95 %     Exercise Oxygen Saturation  during 6 min walk 90 %     Max Ex. HR 93 bpm     Max Ex. BP 128/66     2 Minute Post BP 116/62       Interval HR   1 Minute HR 87     2 Minute HR 92     3 Minute HR 92     4 Minute HR 92     5 Minute HR 93     6 Minute HR 90     2 Minute Post HR 78     Interval Heart Rate? Yes       Interval Oxygen   Interval Oxygen? Yes     Baseline  Oxygen Saturation % 95 %     1 Minute Oxygen Saturation % 93 %     1 Minute Liters of Oxygen 0 L  RA     2 Minute Oxygen Saturation % 93 %     2 Minute Liters of Oxygen 0 L  RA     3 Minute Oxygen Saturation % 92 %     3 Minute Liters of Oxygen 0 L  RA     4 Minute Oxygen Saturation % 92 %     4 Minute Liters of Oxygen 0 L  RA     5 Minute Oxygen Saturation % 90 %     5 Minute Liters of Oxygen 0 L  RA     6 Minute Oxygen Saturation % 91 %     6 Minute Liters of Oxygen 0 L  RA     2 Minute Post Oxygen Saturation % 95 %     2 Minute Post Liters of Oxygen 0 L  RA             Oxygen Initial Assessment:  Oxygen Initial Assessment - 06/20/22 1333       Home Oxygen   Home Oxygen Device None    Sleep Oxygen Prescription None    Home Exercise Oxygen Prescription None    Home Resting Oxygen Prescription None      Intervention   Short Term Goals To learn and understand importance of monitoring SPO2 with pulse oximeter and demonstrate accurate use of the pulse oximeter.;To learn and understand importance of maintaining oxygen saturations>88%;To learn and demonstrate proper pursed lip breathing techniques or other breathing techniques. ;To learn and demonstrate proper use of respiratory medications    Long  Term Goals Verbalizes importance of monitoring SPO2 with pulse  oximeter and return demonstration;Maintenance of O2 saturations>88%;Exhibits proper breathing techniques, such as pursed lip breathing or other method taught during program session;Compliance with respiratory medication             Oxygen Re-Evaluation:  Oxygen Re-Evaluation     [place] Name 07/01/22 1542             Goals/Expected Outcomes   Comments Reviewed PLB technique with pt.  Talked about how it works and it's importance in maintaining their exercise saturations.       Goals/Expected Outcomes Short: Become more profiecient at using PLB.   Long: Become independent at using PLB.                Oxygen  Discharge (Final Oxygen Re-Evaluation):  Oxygen Re-Evaluation - 07/01/22 1542       Goals/Expected Outcomes   Comments Reviewed PLB technique with pt.  Talked about how it works and it's importance in maintaining their exercise saturations.    Goals/Expected Outcomes Short: Become more profiecient at using PLB.   Long: Become independent at using PLB.             Initial Exercise Prescription:  Initial Exercise Prescription - 06/26/22 1500       Date of Initial Exercise RX and Referring Provider   Date 06/26/22    Referring Provider Armando Reichert, MD      Oxygen   Maintain Oxygen Saturation 88% or higher      Recumbant Bike   Level 2    RPM 50    Watts 15    Minutes 15    METs 2.05      REL-XR   Level 1    Speed 50    Minutes 15    METs 2.05      T5 Nustep   Level 1    SPM 80    Minutes 15    METs 2.05      Track   Laps 19    Minutes 15    METs 2.03      Prescription Details   Frequency (times per week) 2    Duration Progress to 30 minutes of continuous aerobic without signs/symptoms of physical distress      Intensity   THRR 40-80% of Max Heartrate 104-136    Ratings of Perceived Exertion 11-13    Perceived Dyspnea 0-4      Progression   Progression Continue to progress workloads to maintain intensity without signs/symptoms of physical distress.      Resistance Training   Training Prescription Yes    Weight 3 lb    Reps 10-15             Perform Capillary Blood Glucose checks as needed.  Exercise Prescription Changes:   Exercise Prescription Changes     Row Name 06/26/22 1500 07/09/22 1400           Response to Exercise   Blood Pressure (Admit) 112/58 112/72      Blood Pressure (Exercise) 128/66 152/78      Blood Pressure (Exit) 116/62 102/58      Heart Rate (Admit) 72 bpm 79 bpm      Heart Rate (Exercise) 93 bpm 96 bpm      Heart Rate (Exit) 78 bpm 80 bpm      Oxygen Saturation (Admit) 95 % 95 %      Oxygen Saturation  (Exercise) 90 % 90 %      Oxygen Saturation (Exit) 95 %  94 %      Rating of Perceived Exertion (Exercise) 12 15      Perceived Dyspnea (Exercise) 2 3      Symptoms SOB SOB      Comments 6MWT Results First two days of exercise      Duration -- Continue with 30 min of aerobic exercise without signs/symptoms of physical distress.      Intensity -- THRR unchanged        Progression   Progression -- Continue to progress workloads to maintain intensity without signs/symptoms of physical distress.      Average METs -- 3.19        Resistance Training   Training Prescription -- Yes      Weight -- 3 lb      Reps -- 10-15        Interval Training   Interval Training -- No        REL-XR   Level -- 1      Minutes -- 15      METs -- 4.9        Track   Laps -- 19      Minutes -- 15      METs -- 2.03        Oxygen   Maintain Oxygen Saturation -- 88% or higher               Exercise Comments:   Exercise Comments     Row Name 07/01/22 1541           Exercise Comments First full day of exercise!  Patient was oriented to gym and equipment including functions, settings, policies, and procedures.  Patient's individual exercise prescription and treatment plan were reviewed.  All starting workloads were established based on the results of the 6 minute walk test done at initial orientation visit.  The plan for exercise progression was also introduced and progression will be customized based on patient's performance and goals.                Exercise Goals and Review:   Exercise Goals     Row Name 06/26/22 1510             Exercise Goals   Increase Physical Activity Yes       Intervention Provide advice, education, support and counseling about physical activity/exercise needs.;Develop an individualized exercise prescription for aerobic and resistive training based on initial evaluation findings, risk stratification, comorbidities and participant's personal goals.        Expected Outcomes Short Term: Attend rehab on a regular basis to increase amount of physical activity.;Long Term: Add in home exercise to make exercise part of routine and to increase amount of physical activity.;Long Term: Exercising regularly at least 3-5 days a week.       Increase Strength and Stamina Yes       Intervention Provide advice, education, support and counseling about physical activity/exercise needs.;Develop an individualized exercise prescription for aerobic and resistive training based on initial evaluation findings, risk stratification, comorbidities and participant's personal goals.       Expected Outcomes Short Term: Increase workloads from initial exercise prescription for resistance, speed, and METs.;Short Term: Perform resistance training exercises routinely during rehab and add in resistance training at home;Long Term: Improve cardiorespiratory fitness, muscular endurance and strength as measured by increased METs and functional capacity (6MWT)       Able to understand and use rate of perceived exertion (RPE) scale Yes  Intervention Provide education and explanation on how to use RPE scale       Expected Outcomes Short Term: Able to use RPE daily in rehab to express subjective intensity level;Long Term:  Able to use RPE to guide intensity level when exercising independently       Able to understand and use Dyspnea scale Yes       Intervention Provide education and explanation on how to use Dyspnea scale       Expected Outcomes Short Term: Able to use Dyspnea scale daily in rehab to express subjective sense of shortness of breath during exertion;Long Term: Able to use Dyspnea scale to guide intensity level when exercising independently       Knowledge and understanding of Target Heart Rate Range (THRR) Yes       Intervention Provide education and explanation of THRR including how the numbers were predicted and where they are located for reference       Expected Outcomes Short  Term: Able to state/look up THRR;Long Term: Able to use THRR to govern intensity when exercising independently;Short Term: Able to use daily as guideline for intensity in rehab       Able to check pulse independently Yes       Intervention Provide education and demonstration on how to check pulse in carotid and radial arteries.;Review the importance of being able to check your own pulse for safety during independent exercise       Expected Outcomes Short Term: Able to explain why pulse checking is important during independent exercise;Long Term: Able to check pulse independently and accurately       Understanding of Exercise Prescription Yes       Intervention Provide education, explanation, and written materials on patient's individual exercise prescription       Expected Outcomes Short Term: Able to explain program exercise prescription;Long Term: Able to explain home exercise prescription to exercise independently                Exercise Goals Re-Evaluation :  Exercise Goals Re-Evaluation     Row Name 07/01/22 1541 07/09/22 1419           Exercise Goal Re-Evaluation   Exercise Goals Review Increase Physical Activity;Able to understand and use rate of perceived exertion (RPE) scale;Knowledge and understanding of Target Heart Rate Range (THRR);Understanding of Exercise Prescription;Increase Strength and Stamina;Able to check pulse independently Increase Physical Activity;Increase Strength and Stamina;Understanding of Exercise Prescription      Comments Reviewed RPE scale, THR and program prescription with pt today.  Pt voiced understanding and was given a copy of goals to take home. Arieon is off to a good start in the program. He had an overall average MET level of 3.19 METs during his first two sessions of rehab. He also did well with level 1 on the XR and walked up to 19 laps on the track. He is still experiencing quite a bit of SOB as he has to stop while walking to catch his breath, and  he rated his breathing at a 3 on the dyspnea scale. We will continue to monitor his progress in the program.      Expected Outcomes Short: Use RPE daily to regulate intensity.  Long: Follow program prescription in THR. Short: Begin to push for more laps on the track. Long: Continue to improve strength and stamina.               Discharge Exercise Prescription (Final Exercise Prescription Changes):  Exercise Prescription Changes - 07/09/22 1400       Response to Exercise   Blood Pressure (Admit) 112/72    Blood Pressure (Exercise) 152/78    Blood Pressure (Exit) 102/58    Heart Rate (Admit) 79 bpm    Heart Rate (Exercise) 96 bpm    Heart Rate (Exit) 80 bpm    Oxygen Saturation (Admit) 95 %    Oxygen Saturation (Exercise) 90 %    Oxygen Saturation (Exit) 94 %    Rating of Perceived Exertion (Exercise) 15    Perceived Dyspnea (Exercise) 3    Symptoms SOB    Comments First two days of exercise    Duration Continue with 30 min of aerobic exercise without signs/symptoms of physical distress.    Intensity THRR unchanged      Progression   Progression Continue to progress workloads to maintain intensity without signs/symptoms of physical distress.    Average METs 3.19      Resistance Training   Training Prescription Yes    Weight 3 lb    Reps 10-15      Interval Training   Interval Training No      REL-XR   Level 1    Minutes 15    METs 4.9      Track   Laps 19    Minutes 15    METs 2.03      Oxygen   Maintain Oxygen Saturation 88% or higher             Nutrition:  Target Goals: Understanding of nutrition guidelines, daily intake of sodium 1500mg , cholesterol 200mg , calories 30% from fat and 7% or less from saturated fats, daily to have 5 or more servings of fruits and vegetables.  Education: All About Nutrition: -Group instruction provided by verbal, written material, interactive activities, discussions, models, and posters to present general guidelines for  heart healthy nutrition including fat, fiber, MyPlate, the role of sodium in heart healthy nutrition, utilization of the nutrition label, and utilization of this knowledge for meal planning. Follow up email sent as well. Written material given at graduation.   Biometrics:  Pre Biometrics - 06/26/22 1511       Pre Biometrics   Height 5' 6.5" (1.689 m)    Weight 158 lb (71.7 kg)    Waist Circumference 35 inches    Hip Circumference 37 inches    Waist to Hip Ratio 0.95 %    BMI (Calculated) 25.12    Single Leg Stand 14.2 seconds              Nutrition Therapy Plan and Nutrition Goals:  Nutrition Therapy & Goals - 07/15/22 1545       Nutrition Therapy   RD appointment deferred Yes   Lief would not like to meet with the RD.     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             Nutrition Assessments:  MEDIFICTS Score Key: ?70 Need to make dietary  changes  40-70 Heart Healthy Diet ? 40 Therapeutic Level Cholesterol Diet  Flowsheet Row Pulmonary Rehab from 06/26/2022 in [facility] Cardiac and Pulmonary Rehab  Picture Your Plate Total Score on Admission 36      Picture Your Plate Scores: [id number] Unhealthy dietary pattern with much room for improvement. 41-50 Dietary pattern unlikely to meet recommendations for good health and room for improvement. 51-60 More healthful dietary pattern, with some room for improvement.  >60 Healthy dietary pattern, although there may be some specific behaviors that could be improved.    Nutrition Goals Re-Evaluation:   Nutrition Goals Discharge (Final Nutrition Goals Re-Evaluation):   Psychosocial: Target Goals: Acknowledge presence or absence of significant depression and/or stress, maximize coping skills, provide positive support system. Participant is able to verbalize types and ability to use techniques and skills needed for reducing stress and depression.   Education: Stress, Anxiety, and Depression - Group verbal and visual presentation to define topics covered.  Reviews how body is impacted by stress, anxiety, and depression.  Also discusses healthy ways to reduce stress and to treat/manage anxiety and depression.  Written material given at graduation.   Education: Sleep Hygiene -Provides group verbal and written instruction about how sleep can affect your health.  Define sleep hygiene, discuss sleep cycles and impact of sleep habits. Review good sleep hygiene tips.    Initial Review & Psychosocial Screening:  Initial Psych Review & Screening - 06/20/22 1336       Initial Review   Current issues with None Identified      Family Dynamics   Good Support System? Yes   wife     Barriers   Psychosocial barriers to participate in program There are no identifiable barriers or psychosocial needs.;The patient should benefit from training in stress management and relaxation.      Screening Interventions   Interventions Encouraged to exercise;Provide feedback about the scores to participant;To provide support and resources with identified psychosocial needs    Expected Outcomes Short Term goal: Utilizing psychosocial counselor, staff and physician to assist with identification of specific Stressors or current issues interfering with healing process. Setting desired goal for each stressor or current issue identified.;Long Term Goal: Stressors or current issues are controlled or eliminated.;Short Term goal: Identification and review with participant of any Quality of  Life or Depression concerns found by scoring the questionnaire.;Long Term goal: The participant improves quality of Life and PHQ9 Scores as seen by post scores and/or verbalization of changes             Quality of Life Scores:  Scores of 19 and below usually indicate a poorer quality of life in these areas.  A difference of  2-3 points is a clinically meaningful difference.  A difference of 2-3 points in the total score of the Quality of Life Index has been associated with significant improvement in overall quality of life, self-image, physical symptoms, and general health in studies assessing change in quality of life.  PHQ-9: Review Flowsheet       06/26/2022  Depression screen PHQ 2/9  Decreased Interest 0  Down, Depressed, Hopeless 0  PHQ - 2 Score 0  Altered sleeping 0  Tired, decreased energy 1  Change in appetite 0  Feeling bad or failure about yourself  0  Trouble concentrating 0  Moving slowly or fidgety/restless 0  Suicidal thoughts 0  PHQ-9 Score 1  Difficult doing work/chores Not difficult at all   Interpretation of Total Score  Total Score Depression  Severity:  1-4 = Minimal depression, 5-9 = Mild depression, 10-14 = Moderate depression, 15-19 = Moderately severe depression, 20-27 = Severe depression   Psychosocial Evaluation and Intervention:  Psychosocial Evaluation - 06/20/22 1343       Psychosocial Evaluation & Interventions   Interventions Encouraged to exercise with the program and follow exercise prescription    Comments Shourya is coming to Pulmonary Rehab for emphysema. He has noticed his breathing has gotten worse over the last few months, example being that now he has to catch his breath at the top of his three step entry. He is tired of feeling short of breath all the time. He wants to quit smoking and picked up the lozenges and patches this week. He was informed about the lifestyle aspect of quitting and is thankful for the help. He does not report  any current stress concerns. His wife is his main support system.    Expected Outcomes Short: attend pulmonary rehab for education and exercise. Long: develop and maintain positive self care habits.    Continue Psychosocial Services  Follow up required by staff             Psychosocial Re-Evaluation:   Psychosocial Discharge (Final Psychosocial Re-Evaluation):   Education: Education Goals: Education classes will be provided on a weekly basis, covering required topics. Participant will state understanding/return demonstration of topics presented.  Learning Barriers/Preferences:  Learning Barriers/Preferences - 06/20/22 1336       Learning Barriers/Preferences   Learning Barriers None    Learning Preferences Individual Instruction             General Pulmonary Education Topics:  Infection Prevention: - Provides verbal and written material to individual with discussion of infection control including proper hand washing and proper equipment cleaning during exercise session. Flowsheet Row Pulmonary Rehab from 07/03/2022 in [facility] Cardiac and Pulmonary Rehab  Date 06/26/22  Educator NT  Instruction Review Code 1- Verbalizes Understanding       Falls Prevention: - Provides verbal and written material to individual with discussion of falls prevention and safety. Flowsheet Row Pulmonary Rehab from 07/03/2022 in [facility] Cardiac and Pulmonary Rehab  Date 06/26/22  Educator NT  Instruction Review Code 1- Verbalizes Understanding       Chronic Lung Disease Review: - Group verbal instruction with posters, models, PowerPoint presentations and videos,  to review new updates, new respiratory medications, new advancements in procedures and treatments. Providing information on websites and "800" numbers for continued self-education. Includes information about supplement oxygen, available portable oxygen systems, continuous and intermittent flow rates, oxygen safety, concentrators, and  Medicare reimbursement for oxygen. Explanation of Pulmonary Drugs, including class, frequency, complications, importance of spacers, rinsing mouth after steroid MDI's, and proper cleaning methods for nebulizers. Review of basic lung anatomy and physiology related to function, structure, and complications of lung disease. Review of risk factors. Discussion about methods for diagnosing sleep apnea and types of masks and machines for OSA. Includes a review of the use of types of environmental controls: home humidity, furnaces, filters, dust mite/pet prevention, HEPA vacuums. Discussion about weather changes, air quality and the benefits of nasal washing. Instruction on Warning signs, infection symptoms, calling MD promptly, preventive modes, and value of vaccinations. Review of effective airway clearance, coughing and/or vibration techniques. Emphasizing that all should Create an Action Plan. Written material given at graduation. Flowsheet Row Pulmonary Rehab from 07/03/2022 in [facility] Cardiac and Pulmonary Rehab  Date 07/03/22  Educator [facility]  Instruction Review Code 1- Verbalizes Understanding  AED/CPR: - Group verbal and written instruction with the use of models to demonstrate the basic use of the AED with the basic ABC's of resuscitation.    Anatomy and Cardiac Procedures: - Group verbal and visual presentation and models provide information about basic cardiac anatomy and function. Reviews the testing methods done to diagnose heart disease and the outcomes of the test results. Describes the treatment choices: Medical Management, Angioplasty, or Coronary Bypass Surgery for treating various heart conditions including Myocardial Infarction, Angina, Valve Disease, and Cardiac Arrhythmias.  Written material given at graduation.   Medication Safety: - Group verbal and visual instruction to review commonly prescribed medications for heart and lung disease. Reviews the medication, class of the drug, and  side effects. Includes the steps to properly store meds and maintain the prescription regimen.  Written material given at graduation.   Other: -Provides group and verbal instruction on various topics (see comments)   Knowledge Questionnaire Score:  Knowledge Questionnaire Score - 06/26/22 1455       Knowledge Questionnaire Score   Pre Score 16/18              Core Components/Risk Factors/Patient Goals at Admission:  Personal Goals and Risk Factors at Admission - 06/26/22 1457       Core Components/Risk Factors/Patient Goals on Admission    Weight Management Yes;Weight Maintenance    Intervention Weight Management: Develop a combined nutrition and exercise program designed to reach desired caloric intake, while maintaining appropriate intake of nutrient and fiber, sodium and fats, and appropriate energy expenditure required for the weight goal.;Weight Management: Provide education and appropriate resources to help participant work on and attain dietary goals.    Admit Weight 158 lb (71.7 kg)    Goal Weight: Long Term 160 lb (72.6 kg)    Expected Outcomes Long Term: Adherence to nutrition and physical activity/exercise program aimed toward attainment of established weight goal;Short Term: Continue to assess and modify interventions until short term weight is achieved;Weight Maintenance: Understanding of the daily nutrition guidelines, which includes 25-35% calories from fat, 7% or less cal from saturated fats, less than 200mg  cholesterol, less than 1.5gm of sodium, & 5 or more servings of fruits and vegetables daily;Understanding recommendations for meals to include 15-35% energy as protein, 25-35% energy from fat, 35-60% energy from carbohydrates, less than 200mg  of dietary cholesterol, 20-35 gm of total fiber daily;Understanding of distribution of calorie intake throughout the day with the consumption of 4-5 meals/snacks    Tobacco Cessation Yes    Number of packs per day 1     Intervention Assist the participant in steps to quit. Provide individualized education and counseling about committing to Tobacco Cessation, relapse prevention, and pharmacological support that can be provided by physician.;Advice worker, assist with locating and accessing local/national Quit Smoking programs, and support quit date choice.    Expected Outcomes Short Term: Will demonstrate readiness to quit, by selecting a quit date.;Short Term: Will quit all tobacco product use, adhering to prevention of relapse plan.;Long Term: Complete abstinence from all tobacco products for at least 12 months from quit date.    Improve shortness of breath with ADL's Yes    Intervention Provide education, individualized exercise plan and daily activity instruction to help decrease symptoms of SOB with activities of daily living.    Expected Outcomes Short Term: Improve cardiorespiratory fitness to achieve a reduction of symptoms when performing ADLs;Long Term: Be able to perform more ADLs without symptoms or delay the onset of symptoms  Lipids Yes    Intervention Provide education and support for participant on nutrition & aerobic/resistive exercise along with prescribed medications to achieve LDL 70mg , HDL >40mg .    Expected Outcomes Short Term: Participant states understanding of desired cholesterol values and is compliant with medications prescribed. Participant is following exercise prescription and nutrition guidelines.;Long Term: Cholesterol controlled with medications as prescribed, with individualized exercise RX and with personalized nutrition plan. Value goals: LDL < 70mg , HDL > 40 mg.             Education:Diabetes - Individual verbal and written instruction to review signs/symptoms of diabetes, desired ranges of glucose level fasting, after meals and with exercise. Acknowledge that pre and post exercise glucose checks will be done for 3 sessions at entry of program.   Know Your  Numbers and Heart Failure: - Group verbal and visual instruction to discuss disease risk factors for cardiac and pulmonary disease and treatment options.  Reviews associated critical values for Overweight/Obesity, Hypertension, Cholesterol, and Diabetes.  Discusses basics of heart failure: signs/symptoms and treatments.  Introduces Heart Failure Zone chart for action plan for heart failure.  Written material given at graduation. Flowsheet Row Pulmonary Rehab from 07/03/2022 in [facility] Cardiac and Pulmonary Rehab  Education need identified 06/26/22       Core Components/Risk Factors/Patient Goals Review:    Core Components/Risk Factors/Patient Goals at Discharge (Final Review):    ITP Comments:  ITP Comments     Row Name 06/20/22 1343 06/26/22 1501 07/01/22 1540 07/17/22 0826     ITP Comments Initial phone call completed. Diagnosis can be found in [facility] 2/15. EP Orientation scheduled for Wednesday 2/28 at 1:30.    Mattheu is a current tobacco user. Intervention for tobacco cessation was provided at the initial medical review. He was asked about readiness to quit and reported that he picked up the lozenges and patches today with the plan to try them out this week . Patient was advised and educated about tobacco cessation using combination therapy, tobacco cessation classes, quit line, and quit smoking apps. Patient demonstrated understanding of this material. Staff will continue to provide encouragement and follow up with the patient throughout the program. Completed 6MWT and gym orientation. Initial ITP created and sent for review to Dr. Ottie Glazier, Medical Director. First full day of exercise!  Patient was oriented to gym and equipment including functions, settings, policies, and procedures.  Patient's individual exercise prescription and treatment plan were reviewed.  All starting workloads were established based on the results of the 6 minute walk test done at initial orientation visit.  The plan  for exercise progression was also introduced and progression will be customized based on patient's performance and goals. 30 Day review completed. Medical Director ITP review done, changes made as directed, and signed approval by Medical Director.     new to program             Comments:

## 2022-07-25 ENCOUNTER — Ambulatory Visit: Payer: Medicare HMO | Attending: Cardiovascular Disease

## 2022-07-25 DIAGNOSIS — J439 Emphysema, unspecified: Secondary | ICD-10-CM

## 2022-07-25 DIAGNOSIS — I428 Other cardiomyopathies: Secondary | ICD-10-CM

## 2022-07-25 DIAGNOSIS — I2510 Atherosclerotic heart disease of native coronary artery without angina pectoris: Secondary | ICD-10-CM

## 2022-07-25 DIAGNOSIS — I429 Cardiomyopathy, unspecified: Secondary | ICD-10-CM | POA: Diagnosis not present

## 2022-07-25 LAB — ECHOCARDIOGRAM COMPLETE
AR max vel: 2.41 cm2
AV Area VTI: 2.45 cm2
AV Area mean vel: 2.36 cm2
AV Mean grad: 2 mmHg
AV Peak grad: 3.7 mmHg
Ao pk vel: 0.96 m/s
Area-P 1/2: 3.48 cm2
Calc EF: 45.3 %
Single Plane A2C EF: 41.8 %
Single Plane A4C EF: 45.8 %

## 2022-07-29 ENCOUNTER — Encounter: Payer: Medicare HMO | Attending: Student in an Organized Health Care Education/Training Program | Admitting: *Deleted

## 2022-07-29 DIAGNOSIS — J439 Emphysema, unspecified: Secondary | ICD-10-CM | POA: Diagnosis not present

## 2022-07-29 NOTE — Progress Notes (Signed)
Daily Session Note  Patient Details  Name: Benjamin Torres MRN: [id number] Date of Birth: [birth year] Referring Provider:   Flowsheet Row Pulmonary Rehab from 06/26/2022 in [facility] Cardiac and Pulmonary Rehab  Referring Provider Armando Reichert, MD       Encounter Date: 07/29/2022  Check In:  Session Check In - 07/29/22 1554       Check-In   Supervising physician immediately available to respond to emergencies See telemetry face sheet for immediately available ER MD    Location [facility]-Cardiac & Pulmonary Rehab    Staff Present Renita Papa, RN BSN;Joseph [place], RCP,RRT,BSRT;Noah [place], [place], Exercise Physiologist    Virtual Visit No    Medication changes reported     No    Fall or balance concerns reported    No    Tobacco Cessation No Change    Current number of cigarettes/nicotine per day     0    Warm-up and Cool-down Performed on first and last piece of equipment    Resistance Training Performed Yes    VAD Patient? No    PAD/SET Patient? No      Pain Assessment   Currently in Pain? No/denies                Social History   Tobacco Use  Smoking Status Every Day   Packs/day: 1.50   Years: 40.00   Additional pack years: 0.00   Total pack years: 60.00   Types: E-cigarettes, Cigarettes   Last attempt to quit: 2015   Years since quitting: 9.2  Smokeless Tobacco Never  Tobacco Comments   Smokes 12-15 cigarettes daily. 06/13/2022.     Goals Met:  Independence with exercise equipment Exercise tolerated well No report of concerns or symptoms today Strength training completed today  Goals Unmet:  Not Applicable  Comments: Pt able to follow exercise prescription today without complaint.  Will continue to monitor for progression.    Dr. Emily Filbert is Medical Director for [place].  Dr. Ottie Glazier is Medical Director for [facility] Pulmonary Rehabilitation.

## 2022-07-31 DIAGNOSIS — J439 Emphysema, unspecified: Secondary | ICD-10-CM

## 2022-07-31 NOTE — Progress Notes (Signed)
Pulmonary Individual Treatment Plan  Patient Details  Name: Benjamin Torres MRN: [id number] Date of Birth: Jan 29, 1956 Referring Provider:   Flowsheet Row Pulmonary Rehab from 06/26/2022 in [facility] Cardiac and Pulmonary Rehab  Referring Provider Armando Reichert, MD       Initial Encounter Date:  Flowsheet Row Pulmonary Rehab from 06/26/2022 in [facility] Cardiac and Pulmonary Rehab  Date 06/26/22       Visit Diagnosis: Pulmonary emphysema, unspecified emphysema type  Patient's Home Medications on Admission:  Current Outpatient Medications:    acetaminophen-codeine (TYLENOL #3) 300-30 MG tablet, Take 1 tablet by mouth as needed., Disp: , Rfl:    albuterol (PROVENTIL HFA;VENTOLIN HFA) 108 (90 Base) MCG/ACT inhaler, Inhale 1 puff into the lungs every 4 (four) hours as needed for wheezing or shortness of breath., Disp: , Rfl:    cyanocobalamin (,VITAMIN B-12,) 1000 MCG/ML injection, Inject into the muscle., Disp: , Rfl:    gabapentin (NEURONTIN) 100 MG capsule, Take 100 mg by mouth daily., Disp: , Rfl:    ibuprofen (ADVIL) 200 MG tablet, Take 400 mg by mouth as needed for headache., Disp: , Rfl:    levothyroxine (SYNTHROID) 75 MCG tablet, Take by mouth., Disp: , Rfl:    metoprolol succinate (TOPROL-XL) 25 MG 24 hr tablet, Take 25 mg by mouth daily., Disp: , Rfl:    montelukast (SINGULAIR) 10 MG tablet, Take 10 mg by mouth daily., Disp: , Rfl:    nicotine polacrilex (NICOTINE MINI) 2 MG lozenge, Take 1 lozenge (2 mg total) by mouth every 2 (two) hours as needed for smoking cessation., Disp: 72 lozenge, Rfl: 3   rosuvastatin (CRESTOR) 10 MG tablet, Take 10 mg by mouth at bedtime., Disp: , Rfl:    tamsulosin (FLOMAX) 0.4 MG CAPS capsule, Take 1 capsule (0.4 mg total) by mouth daily., Disp: 90 capsule, Rfl: 3   TRELEGY ELLIPTA 200-62.5-25 MCG/INH AEPB, Inhale 1 puff into the lungs daily at 2 PM. , Disp: , Rfl:  No current facility-administered medications for this visit.  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 06/13/2022.     Labs: Review Flowsheet       Latest Ref Rng & Units 08/10/2020  Labs for [facility] Cardiac and Pulmonary Rehab  Hemoglobin A1c 4.8 - 5.6 % 5.9      Pulmonary Assessment Scores:  Pulmonary Assessment Scores     Row Name 06/26/22 1453         ADL UCSD   ADL Phase Entry     SOB Score total 41     Rest 0     Walk 2     Stairs 3     Bath 2     Dress 1     Shop 2  CAT Score   CAT Score 19       mMRC Score   mMRC Score 3              UCSD: Self-administered rating of dyspnea associated with activities of daily living (ADLs) 6-point scale (0 = "not at all" to 5 = "maximal or unable to do because of breathlessness")  Scoring Scores range from 0 to 120.  Minimally important difference is 5 units  CAT: CAT can identify the health impairment of COPD patients and is better correlated with disease progression.  CAT has a scoring range of zero to 40. The CAT score is classified into four groups of low (less than 10), medium (10 - 20), high (21-30) and very high (31-40) based on the impact level of disease on health status. A CAT score over 10 suggests significant symptoms.  A worsening CAT score could be explained by an exacerbation, poor medication  adherence, poor inhaler technique, or progression of COPD or comorbid conditions.  CAT MCID is 2 points  mMRC: mMRC (Modified Medical Research Council) Dyspnea Scale is used to assess the degree of baseline functional disability in patients of respiratory disease due to dyspnea. No minimal important difference is established. A decrease in score of 1 point or greater is considered a positive change.   Pulmonary Function Assessment:   Exercise Target Goals: Exercise Program Goal: Individual exercise prescription set using results from initial 6 min walk test and THRR while considering  patient's activity barriers and safety.   Exercise Prescription Goal: Initial exercise prescription builds to 30-45 minutes a day of aerobic activity, 2-3 days per week.  Home exercise guidelines will be given to patient during program as part of exercise prescription that the participant will acknowledge.  Education: Aerobic Exercise: - Group verbal and visual presentation on the components of exercise prescription. Introduces F.I.T.T principle from ACSM for exercise prescriptions.  Reviews F.I.T.T. principles of aerobic exercise including progression. Written material given at graduation.   Education: Resistance Exercise: - Group verbal and visual presentation on the components of exercise prescription. Introduces F.I.T.T principle from ACSM for exercise prescriptions  Reviews F.I.T.T. principles of resistance exercise including progression. Written material given at graduation.    Education: Exercise & Equipment Safety: - Individual verbal instruction and demonstration of equipment use and safety with use of the equipment. Flowsheet Row Pulmonary Rehab from 07/03/2022 in [facility] Cardiac and Pulmonary Rehab  Date 06/26/22  Educator NT  Instruction Review Code 1- Verbalizes Understanding       Education: Exercise Physiology & General Exercise Guidelines: - Group verbal and written instruction with models  to review the exercise physiology of the cardiovascular system and associated critical values. Provides general exercise guidelines with specific guidelines to those with heart or lung disease.    Education: Flexibility, Balance, Mind/Body Relaxation: - Group verbal and visual presentation with interactive activity on the components of exercise prescription. Introduces F.I.T.T principle from ACSM for exercise prescriptions. Reviews F.I.T.T. principles of flexibility and balance exercise training including progression. Also discusses the mind body connection.  Reviews various relaxation techniques to help reduce and manage stress (i.e. Deep breathing, progressive muscle relaxation, and visualization). Balance handout provided to take home. Written material given at graduation.   Activity Barriers & Risk Stratification:  Activity Barriers & Cardiac Risk Stratification - 06/26/22 1510       Activity Barriers & Cardiac Risk Stratification   Activity Barriers Shortness of Breath   Hip pain  6 Minute Walk:  6 Minute Walk     Row Name 06/26/22 1507         6 Minute Walk   Phase Initial     Distance 700 feet     Walk Time 6 minutes     # of Rest Breaks 0     MPH 1.33     METS 2.05     RPE 12     Perceived Dyspnea  2     VO2 Peak 7.19     Symptoms Yes (comment)     Comments SOB     Resting HR 72 bpm     Resting BP 112/58     Resting Oxygen Saturation  95 %     Exercise Oxygen Saturation  during 6 min walk 90 %     Max Ex. HR 93 bpm     Max Ex. BP 128/66     2 Minute Post BP 116/62       Interval HR   1 Minute HR 87     2 Minute HR 92     3 Minute HR 92     4 Minute HR 92     5 Minute HR 93     6 Minute HR 90     2 Minute Post HR 78     Interval Heart Rate? Yes       Interval Oxygen   Interval Oxygen? Yes     Baseline Oxygen Saturation % 95 %     1 Minute Oxygen Saturation % 93 %     1 Minute Liters of Oxygen 0 L  RA     2 Minute Oxygen Saturation % 93 %      2 Minute Liters of Oxygen 0 L  RA     3 Minute Oxygen Saturation % 92 %     3 Minute Liters of Oxygen 0 L  RA     4 Minute Oxygen Saturation % 92 %     4 Minute Liters of Oxygen 0 L  RA     5 Minute Oxygen Saturation % 90 %     5 Minute Liters of Oxygen 0 L  RA     6 Minute Oxygen Saturation % 91 %     6 Minute Liters of Oxygen 0 L  RA     2 Minute Post Oxygen Saturation % 95 %     2 Minute Post Liters of Oxygen 0 L  RA             Oxygen Initial Assessment:  Oxygen Initial Assessment - 06/20/22 1333       Home Oxygen   Home Oxygen Device None    Sleep Oxygen Prescription None    Home Exercise Oxygen Prescription None    Home Resting Oxygen Prescription None      Intervention   Short Term Goals To learn and understand importance of monitoring SPO2 with pulse oximeter and demonstrate accurate use of the pulse oximeter.;To learn and understand importance of maintaining oxygen saturations>88%;To learn and demonstrate proper pursed lip breathing techniques or other breathing techniques. ;To learn and demonstrate proper use of respiratory medications    Long  Term Goals Verbalizes importance of monitoring SPO2 with pulse oximeter and return demonstration;Maintenance of O2 saturations>88%;Exhibits proper breathing techniques, such as pursed lip breathing or other method taught during program session;Compliance with respiratory medication             Oxygen  Re-Evaluation:  Oxygen Re-Evaluation     Row Name 07/01/22 1542             Goals/Expected Outcomes   Comments Reviewed PLB technique with pt.  Talked about how it works and it's importance in maintaining their exercise saturations.       Goals/Expected Outcomes Short: Become more profiecient at using PLB.   Long: Become independent at using PLB.                Oxygen Discharge (Final Oxygen Re-Evaluation):  Oxygen Re-Evaluation - 07/01/22 1542       Goals/Expected Outcomes   Comments Reviewed PLB  technique with pt.  Talked about how it works and it's importance in maintaining their exercise saturations.    Goals/Expected Outcomes Short: Become more profiecient at using PLB.   Long: Become independent at using PLB.             Initial Exercise Prescription:  Initial Exercise Prescription - 06/26/22 1500       Date of Initial Exercise RX and Referring Provider   Date 06/26/22    Referring Provider Armando Reichert, MD      Oxygen   Maintain Oxygen Saturation 88% or higher      Recumbant Bike   Level 2    RPM 50    Watts 15    Minutes 15    METs 2.05      REL-XR   Level 1    Speed 50    Minutes 15    METs 2.05      T5 Nustep   Level 1    SPM 80    Minutes 15    METs 2.05      Track   Laps 19    Minutes 15    METs 2.03      Prescription Details   Frequency (times per week) 2    Duration Progress to 30 minutes of continuous aerobic without signs/symptoms of physical distress      Intensity   THRR 40-80% of Max Heartrate 104-136    Ratings of Perceived Exertion 11-13    Perceived Dyspnea 0-4      Progression   Progression Continue to progress workloads to maintain intensity without signs/symptoms of physical distress.      Resistance Training   Training Prescription Yes    Weight 3 lb    Reps 10-15             Perform Capillary Blood Glucose checks as needed.  Exercise Prescription Changes:   Exercise Prescription Changes     Row Name 06/26/22 1500 07/09/22 1400 07/23/22 1400         Response to Exercise   Blood Pressure (Admit) 112/58 112/72 122/64     Blood Pressure (Exercise) 128/66 152/78 122/64     Blood Pressure (Exit) 116/62 102/58 114/68     Heart Rate (Admit) 72 bpm 79 bpm 81 bpm     Heart Rate (Exercise) 93 bpm 96 bpm 98 bpm     Heart Rate (Exit) 78 bpm 80 bpm 90 bpm     Oxygen Saturation (Admit) 95 % 95 % 96 %     Oxygen Saturation (Exercise) 90 % 90 % 92 %     Oxygen Saturation (Exit) 95 % 94 % 94 %     Rating of  Perceived Exertion (Exercise) 12 15 13      Perceived Dyspnea (Exercise) 2 3 2      Symptoms SOB  SOB SOB     Comments 6MWT Results First two days of exercise --     Duration -- Continue with 30 min of aerobic exercise without signs/symptoms of physical distress. Continue with 30 min of aerobic exercise without signs/symptoms of physical distress.     Intensity -- THRR unchanged THRR unchanged       Progression   Progression -- Continue to progress workloads to maintain intensity without signs/symptoms of physical distress. Continue to progress workloads to maintain intensity without signs/symptoms of physical distress.     Average METs -- 3.19 2       Resistance Training   Training Prescription -- Yes Yes     Weight -- 3 lb 3 lb     Reps -- 10-15 10-15       Interval Training   Interval Training -- No No       REL-XR   Level -- 1 1     Minutes -- 15 15     METs -- 4.9 2.4       Track   Laps -- 19 11     Minutes -- 15 15     METs -- 2.03 1.6       Oxygen   Maintain Oxygen Saturation -- 88% or higher 88% or higher              Exercise Comments:   Exercise Comments     Row Name 07/01/22 1541           Exercise Comments First full day of exercise!  Patient was oriented to gym and equipment including functions, settings, policies, and procedures.  Patient's individual exercise prescription and treatment plan were reviewed.  All starting workloads were established based on the results of the 6 minute walk test done at initial orientation visit.  The plan for exercise progression was also introduced and progression will be customized based on patient's performance and goals.                Exercise Goals and Review:   Exercise Goals     Row Name 06/26/22 1510             Exercise Goals   Increase Physical Activity Yes       Intervention Provide advice, education, support and counseling about physical activity/exercise needs.;Develop an individualized  exercise prescription for aerobic and resistive training based on initial evaluation findings, risk stratification, comorbidities and participant's personal goals.       Expected Outcomes Short Term: Attend rehab on a regular basis to increase amount of physical activity.;Long Term: Add in home exercise to make exercise part of routine and to increase amount of physical activity.;Long Term: Exercising regularly at least 3-5 days a week.       Increase Strength and Stamina Yes       Intervention Provide advice, education, support and counseling about physical activity/exercise needs.;Develop an individualized exercise prescription for aerobic and resistive training based on initial evaluation findings, risk stratification, comorbidities and participant's personal goals.       Expected Outcomes Short Term: Increase workloads from initial exercise prescription for resistance, speed, and METs.;Short Term: Perform resistance training exercises routinely during rehab and add in resistance training at home;Long Term: Improve cardiorespiratory fitness, muscular endurance and strength as measured by increased METs and functional capacity (6MWT)       Able to understand and use rate of perceived exertion (RPE) scale Yes       Intervention Provide  education and explanation on how to use RPE scale       Expected Outcomes Short Term: Able to use RPE daily in rehab to express subjective intensity level;Long Term:  Able to use RPE to guide intensity level when exercising independently       Able to understand and use Dyspnea scale Yes       Intervention Provide education and explanation on how to use Dyspnea scale       Expected Outcomes Short Term: Able to use Dyspnea scale daily in rehab to express subjective sense of shortness of breath during exertion;Long Term: Able to use Dyspnea scale to guide intensity level when exercising independently       Knowledge and understanding of Target Heart Rate Range (THRR) Yes        Intervention Provide education and explanation of THRR including how the numbers were predicted and where they are located for reference       Expected Outcomes Short Term: Able to state/look up THRR;Long Term: Able to use THRR to govern intensity when exercising independently;Short Term: Able to use daily as guideline for intensity in rehab       Able to check pulse independently Yes       Intervention Provide education and demonstration on how to check pulse in carotid and radial arteries.;Review the importance of being able to check your own pulse for safety during independent exercise       Expected Outcomes Short Term: Able to explain why pulse checking is important during independent exercise;Long Term: Able to check pulse independently and accurately       Understanding of Exercise Prescription Yes       Intervention Provide education, explanation, and written materials on patient's individual exercise prescription       Expected Outcomes Short Term: Able to explain program exercise prescription;Long Term: Able to explain home exercise prescription to exercise independently                Exercise Goals Re-Evaluation :  Exercise Goals Re-Evaluation     Row Name 07/01/22 1541 07/09/22 1419 07/23/22 1446         Exercise Goal Re-Evaluation   Exercise Goals Review Increase Physical Activity;Able to understand and use rate of perceived exertion (RPE) scale;Knowledge and understanding of Target Heart Rate Range (THRR);Understanding of Exercise Prescription;Increase Strength and Stamina;Able to check pulse independently Increase Physical Activity;Increase Strength and Stamina;Understanding of Exercise Prescription Increase Physical Activity;Increase Strength and Stamina;Understanding of Exercise Prescription     Comments Reviewed RPE scale, THR and program prescription with pt today.  Pt voiced understanding and was given a copy of goals to take home. Kelsen is off to a good start in the  program. He had an overall average MET level of 3.19 METs during his first two sessions of rehab. He also did well with level 1 on the XR and walked up to 19 laps on the track. He is still experiencing quite a bit of SOB as he has to stop while walking to catch his breath, and he rated his breathing at a 3 on the dyspnea scale. We will continue to monitor his progress in the program. Breon has only completed 1 session since last review as he has been out for Dr. [name] Fries and not feeling well. He walked 11 laps on the track and worked up to 2.4 METS on the XR. We hope to see more consistent attendance and yiled better results. Will continue to monitor.  Expected Outcomes Short: Use RPE daily to regulate intensity.  Long: Follow program prescription in THR. Short: Begin to push for more laps on the track. Long: Continue to improve strength and stamina. Short: Maintain good attendance Long: Continue to increase overall MET level and stamina              Discharge Exercise Prescription (Final Exercise Prescription Changes):  Exercise Prescription Changes - 07/23/22 1400       Response to Exercise   Blood Pressure (Admit) 122/64    Blood Pressure (Exercise) 122/64    Blood Pressure (Exit) 114/68    Heart Rate (Admit) 81 bpm    Heart Rate (Exercise) 98 bpm    Heart Rate (Exit) 90 bpm    Oxygen Saturation (Admit) 96 %    Oxygen Saturation (Exercise) 92 %    Oxygen Saturation (Exit) 94 %    Rating of Perceived Exertion (Exercise) 13    Perceived Dyspnea (Exercise) 2    Symptoms SOB    Duration Continue with 30 min of aerobic exercise without signs/symptoms of physical distress.    Intensity THRR unchanged      Progression   Progression Continue to progress workloads to maintain intensity without signs/symptoms of physical distress.    Average METs 2      Resistance Training   Training Prescription Yes    Weight 3 lb    Reps 10-15      Interval Training   Interval Training No       REL-XR   Level 1    Minutes 15    METs 2.4      Track   Laps 11    Minutes 15    METs 1.6      Oxygen   Maintain Oxygen Saturation 88% or higher             Nutrition:  Target Goals: Understanding of nutrition guidelines, daily intake of sodium 1500mg , cholesterol 200mg , calories 30% from fat and 7% or less from saturated fats, daily to have 5 or more servings of fruits and vegetables.  Education: All About Nutrition: -Group instruction provided by verbal, written material, interactive activities, discussions, models, and posters to present general guidelines for heart healthy nutrition including fat, fiber, MyPlate, the role of sodium in heart healthy nutrition, utilization of the nutrition label, and utilization of this knowledge for meal planning. Follow up email sent as well. Written material given at graduation.   Biometrics:  Pre Biometrics - 06/26/22 1511       Pre Biometrics   Height 5' 6.5" (1.689 m)    Weight 158 lb (71.7 kg)    Waist Circumference 35 inches    Hip Circumference 37 inches    Waist to Hip Ratio 0.95 %    BMI (Calculated) 25.12    Single Leg Stand 14.2 seconds              Nutrition Therapy Plan and Nutrition Goals:  Nutrition Therapy & Goals - 07/15/22 1545       Nutrition Therapy   RD appointment deferred Yes   Adoni would not like to meet with the RD.     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             Nutrition Assessments:  MEDIFICTS Score Key: ?70 Need  to make dietary changes  40-70 Heart Healthy Diet ? 40 Therapeutic Level Cholesterol Diet  Flowsheet Row Pulmonary Rehab from 06/26/2022 in [facility] Cardiac and Pulmonary Rehab  Picture Your Plate Total Score on Admission 36      Picture Your Plate Scores: [id number] Unhealthy dietary pattern with much room for improvement. 41-50 Dietary pattern unlikely to meet recommendations for good health and room for improvement. 51-60 More healthful dietary pattern, with some room for improvement.  >60 Healthy dietary pattern, although there may be some specific behaviors that could be improved.   Nutrition Goals Re-Evaluation:   Nutrition Goals Discharge (Final Nutrition Goals Re-Evaluation):   Psychosocial: Target Goals: Acknowledge presence or absence of significant depression and/or stress, maximize coping skills, provide positive support system. Participant is able to verbalize types and ability to use techniques and skills needed for reducing stress and depression.   Education: Stress, Anxiety, and Depression - Group verbal and visual presentation to define topics covered.  Reviews how body is impacted by stress, anxiety, and depression.  Also discusses healthy ways to reduce stress and to treat/manage anxiety and depression.  Written material given at graduation.   Education: Sleep Hygiene -Provides group verbal and written instruction about how sleep can affect your health.  Define sleep hygiene, discuss sleep cycles and impact of sleep habits. Review good sleep hygiene tips.    Initial Review & Psychosocial Screening:  Initial Psych Review & Screening - 06/20/22 1336       Initial Review   Current issues with None Identified      Family Dynamics   Good Support System? Yes   wife     Barriers   Psychosocial barriers to participate in program There are no identifiable barriers or psychosocial needs.;The patient should benefit from training in stress management and relaxation.       Screening Interventions   Interventions Encouraged to exercise;Provide feedback about the scores to participant;To provide support and resources with identified psychosocial needs    Expected Outcomes Short Term goal: Utilizing psychosocial counselor, staff and physician to assist with identification of specific Stressors or current issues interfering with healing process. Setting desired goal for each stressor or current issue identified.;Long Term Goal: Stressors or current issues are controlled or eliminated.;Short Term goal: Identification and review with participant of any Quality of Life or Depression concerns found by scoring the questionnaire.;Long Term goal: The participant improves quality of Life and PHQ9 Scores as seen by post scores and/or verbalization of changes             Quality of Life Scores:  Scores of 19 and below usually indicate a poorer quality of life in these areas.  A difference of  2-3 points is a clinically meaningful difference.  A difference of 2-3 points in the total score of the Quality of Life Index has been associated with significant improvement in overall quality of life, self-image, physical symptoms, and general health in studies assessing change in quality of life.  PHQ-9: Review Flowsheet       06/26/2022  Depression screen PHQ 2/9  Decreased Interest 0  Down, Depressed, Hopeless 0  PHQ - 2 Score 0  Altered sleeping 0  Tired, decreased energy 1  Change in appetite 0  Feeling bad or failure about yourself  0  Trouble concentrating 0  Moving slowly or fidgety/restless 0  Suicidal thoughts 0  PHQ-9 Score 1  Difficult doing work/chores Not difficult at all   Interpretation of Total Score  Total Score Depression Severity:  1-4 = Minimal depression, 5-9 = Mild depression, 10-14 = Moderate depression, 15-19 = Moderately severe depression, 20-27 = Severe depression   Psychosocial Evaluation and Intervention:  Psychosocial Evaluation - 06/20/22  1343       Psychosocial Evaluation & Interventions   Interventions Encouraged to exercise with the program and follow exercise prescription    Comments Kiptyn is coming to Pulmonary Rehab for emphysema. He has noticed his breathing has gotten worse over the last few months, example being that now he has to catch his breath at the top of his three step entry. He is tired of feeling short of breath all the time. He wants to quit smoking and picked up the lozenges and patches this week. He was informed about the lifestyle aspect of quitting and is thankful for the help. He does not report any current stress concerns. His wife is his main support system.    Expected Outcomes Short: attend pulmonary rehab for education and exercise. Long: develop and maintain positive self care habits.    Continue Psychosocial Services  Follow up required by staff             Psychosocial Re-Evaluation:   Psychosocial Discharge (Final Psychosocial Re-Evaluation):   Education: Education Goals: Education classes will be provided on a weekly basis, covering required topics. Participant will state understanding/return demonstration of topics presented.  Learning Barriers/Preferences:  Learning Barriers/Preferences - 06/20/22 1336       Learning Barriers/Preferences   Learning Barriers None    Learning Preferences Individual Instruction             General Pulmonary Education Topics:  Infection Prevention: - Provides verbal and written material to individual with discussion of infection control including proper hand washing and proper equipment cleaning during exercise session. Flowsheet Row Pulmonary Rehab from 07/03/2022 in [facility] Cardiac and Pulmonary Rehab  Date 06/26/22  Educator NT  Instruction Review Code 1- Verbalizes Understanding       Falls Prevention: - Provides verbal and written material to individual with discussion of falls prevention and safety. Flowsheet Row Pulmonary Rehab  from 07/03/2022 in [facility] Cardiac and Pulmonary Rehab  Date 06/26/22  Educator NT  Instruction Review Code 1- Verbalizes Understanding       Chronic Lung Disease Review: - Group verbal instruction with posters, models, PowerPoint presentations and videos,  to review new updates, new respiratory medications, new advancements in procedures and treatments. Providing information on websites and "800" numbers for continued self-education. Includes information about supplement oxygen, available portable oxygen systems, continuous and intermittent flow rates, oxygen safety, concentrators, and Medicare reimbursement for oxygen. Explanation of Pulmonary Drugs, including class, frequency, complications, importance of spacers, rinsing mouth after steroid MDI's, and proper cleaning methods for nebulizers. Review of basic lung anatomy and physiology related to function, structure, and complications of lung disease. Review of risk factors. Discussion about methods for diagnosing sleep apnea and types of masks and machines for OSA. Includes a review of the use of types of environmental controls: home humidity, furnaces, filters, dust mite/pet prevention, HEPA vacuums. Discussion about weather changes, air quality and the benefits of nasal washing. Instruction on Warning signs,  infection symptoms, calling MD promptly, preventive modes, and value of vaccinations. Review of effective airway clearance, coughing and/or vibration techniques. Emphasizing that all should Create an Action Plan. Written material given at graduation. Flowsheet Row Pulmonary Rehab from 07/03/2022 in [facility] Cardiac and Pulmonary Rehab  Date 07/03/22  Educator [facility]  Instruction Review Code 1- Verbalizes Understanding       AED/CPR: - Group verbal and written instruction with the use of models to demonstrate the basic use of the AED with the basic ABC's of resuscitation.    Anatomy and Cardiac Procedures: - Group verbal and visual presentation and  models provide information about basic cardiac anatomy and function. Reviews the testing methods done to diagnose heart disease and the outcomes of the test results. Describes the treatment choices: Medical Management, Angioplasty, or Coronary Bypass Surgery for treating various heart conditions including Myocardial Infarction, Angina, Valve Disease, and Cardiac Arrhythmias.  Written material given at graduation.   Medication Safety: - Group verbal and visual instruction to review commonly prescribed medications for heart and lung disease. Reviews the medication, class of the drug, and side effects. Includes the steps to properly store meds and maintain the prescription regimen.  Written material given at graduation.   Other: -Provides group and verbal instruction on various topics (see comments)   Knowledge Questionnaire Score:  Knowledge Questionnaire Score - 06/26/22 1455       Knowledge Questionnaire Score   Pre Score 16/18              Core Components/Risk Factors/Patient Goals at Admission:  Personal Goals and Risk Factors at Admission - 06/26/22 1457       Core Components/Risk Factors/Patient Goals on Admission    Weight Management Yes;Weight Maintenance    Intervention Weight Management: Develop a combined nutrition and exercise program designed to reach desired caloric intake, while maintaining appropriate intake of nutrient and fiber, sodium and fats, and appropriate energy expenditure required for the weight goal.;Weight Management: Provide education and appropriate resources to help participant work on and attain dietary goals.    Admit Weight 158 lb (71.7 kg)    Goal Weight: Long Term 160 lb (72.6 kg)    Expected Outcomes Long Term: Adherence to nutrition and physical activity/exercise program aimed toward attainment of established weight goal;Short Term: Continue to assess and modify interventions until short term weight is achieved;Weight Maintenance: Understanding of  the daily nutrition guidelines, which includes 25-35% calories from fat, 7% or less cal from saturated fats, less than 200mg  cholesterol, less than 1.5gm of sodium, & 5 or more servings of fruits and vegetables daily;Understanding recommendations for meals to include 15-35% energy as protein, 25-35% energy from fat, 35-60% energy from carbohydrates, less than 200mg  of dietary cholesterol, 20-35 gm of total fiber daily;Understanding of distribution of calorie intake throughout the day with the consumption of 4-5 meals/snacks    Tobacco Cessation Yes    Number of packs per day 1    Intervention Assist the participant in steps to quit. Provide individualized education and counseling about committing to Tobacco Cessation, relapse prevention, and pharmacological support that can be provided by physician.;Advice worker, assist with locating and accessing local/national Quit Smoking programs, and support quit date choice.    Expected Outcomes Short Term: Will demonstrate readiness to quit, by selecting a quit date.;Short Term: Will quit all tobacco product use, adhering to prevention of relapse plan.;Long Term: Complete abstinence from all tobacco products for at least 12 months from quit date.    Improve  shortness of breath with ADL's Yes    Intervention Provide education, individualized exercise plan and daily activity instruction to help decrease symptoms of SOB with activities of daily living.    Expected Outcomes Short Term: Improve cardiorespiratory fitness to achieve a reduction of symptoms when performing ADLs;Long Term: Be able to perform more ADLs without symptoms or delay the onset of symptoms    Lipids Yes    Intervention Provide education and support for participant on nutrition & aerobic/resistive exercise along with prescribed medications to achieve LDL 70mg , HDL >40mg .    Expected Outcomes Short Term: Participant states understanding of desired cholesterol values and is compliant  with medications prescribed. Participant is following exercise prescription and nutrition guidelines.;Long Term: Cholesterol controlled with medications as prescribed, with individualized exercise RX and with personalized nutrition plan. Value goals: LDL < 70mg , HDL > 40 mg.             Education:Diabetes - Individual verbal and written instruction to review signs/symptoms of diabetes, desired ranges of glucose level fasting, after meals and with exercise. Acknowledge that pre and post exercise glucose checks will be done for 3 sessions at entry of program.   Know Your Numbers and Heart Failure: - Group verbal and visual instruction to discuss disease risk factors for cardiac and pulmonary disease and treatment options.  Reviews associated critical values for Overweight/Obesity, Hypertension, Cholesterol, and Diabetes.  Discusses basics of heart failure: signs/symptoms and treatments.  Introduces Heart Failure Zone chart for action plan for heart failure.  Written material given at graduation. Flowsheet Row Pulmonary Rehab from 07/03/2022 in [facility] Pulmonary Rehab  Education need identified 06/26/22       Core Components/Risk Factors/Patient Goals Review:    Core Components/Risk Factors/Patient Goals at Discharge (Final Review):    ITP Comments:  ITP Comments     Row Name 06/20/22 1343 06/26/22 1501 07/01/22 1540 07/17/22 0826 07/25/22 1533   ITP Comments Initial phone call completed. Diagnosis can be found in [facility] 2/15. EP Orientation scheduled for Wednesday 2/28 at 1:30.    Bedford is a current tobacco user. Intervention for tobacco cessation was provided at the initial medical review. He was asked about readiness to quit and reported that he picked up the lozenges and patches today with the plan to try them out this week . Patient was advised and educated about tobacco cessation using combination therapy, tobacco cessation classes, quit line, and quit smoking apps. Patient  demonstrated understanding of this material. Staff will continue to provide encouragement and follow up with the patient throughout the program. Completed 6MWT and gym orientation. Initial ITP created and sent for review to Dr. Ottie Glazier, Medical Director. First full day of exercise!  Patient was oriented to gym and equipment including functions, settings, policies, and procedures.  Patient's individual exercise prescription and treatment plan were reviewed.  All starting workloads were established based on the results of the 6 minute walk test done at initial orientation visit.  The plan for exercise progression was also introduced and progression will be customized based on patient's performance and goals. 30 Day review completed. Medical Director ITP review done, changes made as directed, and signed approval by Medical Director.     new to program Patient out of rehab due to cold/flu symptoms starting on 3/21. Negative covid test. Told patient he cannot return until fully symptom free/ no fever. Return 4/1 unless he does not feel well he needs to call to let us know.  [place] Name 07/31/22 1415           ITP Comments [place] called to say he would not be coming back to rehab. He states his breathing is not good and it takes him two days to recover from exercise. Patient discharged.                Comments: Discharge ITP.

## 2022-07-31 NOTE — Progress Notes (Signed)
Discharge Progress Report  Patient Details  Name: Benjamin Torres MRN: [id number] Date of Birth: 1956/03/26 Referring Provider:   Flowsheet Row Pulmonary Rehab from 06/26/2022 in [facility]  Referring Provider Benjamin Reichert, MD        Number of Visits: 6/36  Reason for Discharge:  Early Exit:  Personal  Smoking History:  Social History   Tobacco Use  Smoking Status Every Day   Packs/day: 1.50   Years: 40.00   Additional pack years: 0.00   Total pack years: 60.00   Types: E-cigarettes, Cigarettes   Last attempt to quit: 2015   Years since quitting: 9.2  Smokeless Tobacco Never  Tobacco Comments   Smokes 12-15 cigarettes daily. 06/13/2022.     Diagnosis:  Pulmonary emphysema, unspecified emphysema type  ADL UCSD:  Pulmonary Assessment Scores     Row Name 06/26/22 1453         ADL UCSD   ADL Phase Entry     SOB Score total 41     Rest 0     Walk 2     Stairs 3     Bath 2     Dress 1     Shop 2       CAT Score   CAT Score 19       mMRC Score   mMRC Score 3              Initial Exercise Prescription:  Initial Exercise Prescription - 06/26/22 1500       Date of Initial Exercise RX and Referring Provider   Date 06/26/22    Referring Provider Benjamin Reichert, MD      Oxygen   Maintain Oxygen Saturation 88% or higher      Recumbant Bike   Level 2    RPM 50    Watts 15    Minutes 15    METs 2.05      REL-XR   Level 1    Speed 50    Minutes 15    METs 2.05      T5 Nustep   Level 1    SPM 80    Minutes 15    METs 2.05      Track   Laps 19    Minutes 15    METs 2.03      Prescription Details   Frequency (times per week) 2    Duration Progress to 30 minutes of continuous aerobic without signs/symptoms of physical distress      Intensity   THRR 40-80% of Max Heartrate 104-136    Ratings of Perceived Exertion 11-13    Perceived Dyspnea 0-4      Progression   Progression Continue to progress workloads to  maintain intensity without signs/symptoms of physical distress.      Resistance Training   Training Prescription Yes    Weight 3 lb    Reps 10-15             Discharge Exercise Prescription (Final Exercise Prescription Changes):  Exercise Prescription Changes - 07/23/22 1400       Response to Exercise   Blood Pressure (Admit) 122/64    Blood Pressure (Exercise) 122/64    Blood Pressure (Exit) 114/68    Heart Rate (Admit) 81 bpm    Heart Rate (Exercise) 98 bpm    Heart Rate (Exit) 90 bpm    Oxygen Saturation (Admit) 96 %    Oxygen Saturation (Exercise) 92 %  Oxygen Saturation (Exit) 94 %    Rating of Perceived Exertion (Exercise) 13    Perceived Dyspnea (Exercise) 2    Symptoms SOB    Duration Continue with 30 min of aerobic exercise without signs/symptoms of physical distress.    Intensity THRR unchanged      Progression   Progression Continue to progress workloads to maintain intensity without signs/symptoms of physical distress.    Average METs 2      Resistance Training   Training Prescription Yes    Weight 3 lb    Reps 10-15      Interval Training   Interval Training No      REL-XR   Level 1    Minutes 15    METs 2.4      Track   Laps 11    Minutes 15    METs 1.6      Oxygen   Maintain Oxygen Saturation 88% or higher             Functional Capacity:  6 Minute Walk     Row Name 06/26/22 1507         6 Minute Walk   Phase Initial     Distance 700 feet     Walk Time 6 minutes     # of Rest Breaks 0     MPH 1.33     METS 2.05     RPE 12     Perceived Dyspnea  2     VO2 Peak 7.19     Symptoms Yes (comment)     Comments SOB     Resting HR 72 bpm     Resting BP 112/58     Resting Oxygen Saturation  95 %     Exercise Oxygen Saturation  during 6 min walk 90 %     Max Ex. HR 93 bpm     Max Ex. BP 128/66     2 Minute Post BP 116/62       Interval HR   1 Minute HR 87     2 Minute HR 92     3 Minute HR 92     4 Minute HR 92      5 Minute HR 93     6 Minute HR 90     2 Minute Post HR 78     Interval Heart Rate? Yes       Interval Oxygen   Interval Oxygen? Yes     Baseline Oxygen Saturation % 95 %     1 Minute Oxygen Saturation % 93 %     1 Minute Liters of Oxygen 0 L  RA     2 Minute Oxygen Saturation % 93 %     2 Minute Liters of Oxygen 0 L  RA     3 Minute Oxygen Saturation % 92 %     3 Minute Liters of Oxygen 0 L  RA     4 Minute Oxygen Saturation % 92 %     4 Minute Liters of Oxygen 0 L  RA     5 Minute Oxygen Saturation % 90 %     5 Minute Liters of Oxygen 0 L  RA     6 Minute Oxygen Saturation % 91 %     6 Minute Liters of Oxygen 0 L  RA     2 Minute Post Oxygen Saturation % 95 %     2 Minute Post Liters of  Oxygen 0 L  RA              Psychological, QOL, Others - Outcomes: PHQ 2/9:    06/26/2022    2:49 PM  Depression screen PHQ 2/9  Decreased Interest 0  Down, Depressed, Hopeless 0  PHQ - 2 Score 0  Altered sleeping 0  Tired, decreased energy 1  Change in appetite 0  Feeling bad or failure about yourself  0  Trouble concentrating 0  Moving slowly or fidgety/restless 0  Suicidal thoughts 0  PHQ-9 Score 1  Difficult doing work/chores Not difficult at all    Quality of Life:   Personal Goals: Goals established at orientation with interventions provided to work toward goal.  Personal Goals and Risk Factors at Admission - 06/26/22 1457       Core Components/Risk Factors/Patient Goals on Admission    Weight Management Yes;Weight Maintenance    Intervention Weight Management: Develop a combined nutrition and exercise program designed to reach desired caloric intake, while maintaining appropriate intake of nutrient and fiber, sodium and fats, and appropriate energy expenditure required for the weight goal.;Weight Management: Provide education and appropriate resources to help participant work on and attain dietary goals.    Admit Weight 158 lb (71.7 kg)    Goal Weight: Long Term 160  lb (72.6 kg)    Expected Outcomes Long Term: Adherence to nutrition and physical activity/exercise program aimed toward attainment of established weight goal;Short Term: Continue to assess and modify interventions until short term weight is achieved;Weight Maintenance: Understanding of the daily nutrition guidelines, which includes 25-35% calories from fat, 7% or less cal from saturated fats, less than 200mg  cholesterol, less than 1.5gm of sodium, & 5 or more servings of fruits and vegetables daily;Understanding recommendations for meals to include 15-35% energy as protein, 25-35% energy from fat, 35-60% energy from carbohydrates, less than 200mg  of dietary cholesterol, 20-35 gm of total fiber daily;Understanding of distribution of calorie intake throughout the day with the consumption of 4-5 meals/snacks    Tobacco Cessation Yes    Number of packs per day 1    Intervention Assist the participant in steps to quit. Provide individualized education and counseling about committing to Tobacco Cessation, relapse prevention, and pharmacological support that can be provided by physician.;Advice worker, assist with locating and accessing local/national Quit Smoking programs, and support quit date choice.    Expected Outcomes Short Term: Will demonstrate readiness to quit, by selecting a quit date.;Short Term: Will quit all tobacco product use, adhering to prevention of relapse plan.;Long Term: Complete abstinence from all tobacco products for at least 12 months from quit date.    Improve shortness of breath with ADL's Yes    Intervention Provide education, individualized exercise plan and daily activity instruction to help decrease symptoms of SOB with activities of daily living.    Expected Outcomes Short Term: Improve cardiorespiratory fitness to achieve a reduction of symptoms when performing ADLs;Long Term: Be able to perform more ADLs without symptoms or delay the onset of symptoms    Lipids  Yes    Intervention Provide education and support for participant on nutrition & aerobic/resistive exercise along with prescribed medications to achieve LDL 70mg , HDL >40mg .    Expected Outcomes Short Term: Participant states understanding of desired cholesterol values and is compliant with medications prescribed. Participant is following exercise prescription and nutrition guidelines.;Long Term: Cholesterol controlled with medications as prescribed, with individualized exercise RX and with personalized nutrition plan. Value goals: LDL <  70mg , HDL > 40 mg.              Personal Goals Discharge:   Exercise Goals and Review:  Exercise Goals     Row Name 06/26/22 1510             Exercise Goals   Increase Physical Activity Yes       Intervention Provide advice, education, support and counseling about physical activity/exercise needs.;Develop an individualized exercise prescription for aerobic and resistive training based on initial evaluation findings, risk stratification, comorbidities and participant's personal goals.       Expected Outcomes Short Term: Attend rehab on a regular basis to increase amount of physical activity.;Long Term: Add in home exercise to make exercise part of routine and to increase amount of physical activity.;Long Term: Exercising regularly at least 3-5 days a week.       Increase Strength and Stamina Yes       Intervention Provide advice, education, support and counseling about physical activity/exercise needs.;Develop an individualized exercise prescription for aerobic and resistive training based on initial evaluation findings, risk stratification, comorbidities and participant's personal goals.       Expected Outcomes Short Term: Increase workloads from initial exercise prescription for resistance, speed, and METs.;Short Term: Perform resistance training exercises routinely during rehab and add in resistance training at home;Long Term: Improve cardiorespiratory  fitness, muscular endurance and strength as measured by increased METs and functional capacity (6MWT)       Able to understand and use rate of perceived exertion (RPE) scale Yes       Intervention Provide education and explanation on how to use RPE scale       Expected Outcomes Short Term: Able to use RPE daily in rehab to express subjective intensity level;Long Term:  Able to use RPE to guide intensity level when exercising independently       Able to understand and use Dyspnea scale Yes       Intervention Provide education and explanation on how to use Dyspnea scale       Expected Outcomes Short Term: Able to use Dyspnea scale daily in rehab to express subjective sense of shortness of breath during exertion;Long Term: Able to use Dyspnea scale to guide intensity level when exercising independently       Knowledge and understanding of Target Heart Rate Range (THRR) Yes       Intervention Provide education and explanation of THRR including how the numbers were predicted and where they are located for reference       Expected Outcomes Short Term: Able to state/look up THRR;Long Term: Able to use THRR to govern intensity when exercising independently;Short Term: Able to use daily as guideline for intensity in rehab       Able to check pulse independently Yes       Intervention Provide education and demonstration on how to check pulse in carotid and radial arteries.;Review the importance of being able to check your own pulse for safety during independent exercise       Expected Outcomes Short Term: Able to explain why pulse checking is important during independent exercise;Long Term: Able to check pulse independently and accurately       Understanding of Exercise Prescription Yes       Intervention Provide education, explanation, and written materials on patient's individual exercise prescription       Expected Outcomes Short Term: Able to explain program exercise prescription;Long Term: Able to explain  home exercise prescription to  exercise independently                Exercise Goals Re-Evaluation:  Exercise Goals Re-Evaluation     Row Name 07/01/22 1541 07/09/22 1419 07/23/22 1446         Exercise Goal Re-Evaluation   Exercise Goals Review Increase Physical Activity;Able to understand and use rate of perceived exertion (RPE) scale;Knowledge and understanding of Target Heart Rate Range (THRR);Understanding of Exercise Prescription;Increase Strength and Stamina;Able to check pulse independently Increase Physical Activity;Increase Strength and Stamina;Understanding of Exercise Prescription Increase Physical Activity;Increase Strength and Stamina;Understanding of Exercise Prescription     Comments Reviewed RPE scale, THR and program prescription with pt today.  Pt voiced understanding and was given a copy of goals to take home. Benjamin Torres is off to a good start in the program. He had an overall average MET level of 3.19 METs during his first two sessions of rehab. He also did well with level 1 on the XR and walked up to 19 laps on the track. He is still experiencing quite a bit of SOB as he has to stop while walking to catch his breath, and he rated his breathing at a 3 on the dyspnea scale. We will continue to monitor his progress in the program. Benjamin Torres has only completed 1 session since last review as he has been out for Benjamin Torres and not feeling well. He walked 11 laps on the track and worked up to 2.4 METS on the XR. We hope to see more consistent attendance and yiled better results. Will continue to monitor.     Expected Outcomes Short: Use RPE daily to regulate intensity.  Long: Follow program prescription in THR. Short: Begin to push for more laps on the track. Long: Continue to improve strength and stamina. Short: Maintain good attendance Long: Continue to increase overall MET level and stamina              Nutrition & Weight - Outcomes:  Pre Biometrics - 06/26/22 1511       Pre  Biometrics   Height 5' 6.5" (1.689 m)    Weight 158 lb (71.7 kg)    Waist Circumference 35 inches    Hip Circumference 37 inches    Waist to Hip Ratio 0.95 %    BMI (Calculated) 25.12    Single Leg Stand 14.2 seconds              Nutrition:  Nutrition Therapy & Goals - 07/15/22 1545       Nutrition Therapy   RD appointment deferred Yes   Reford would not like to meet with the RD.     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             Nutrition Discharge:   Education Questionnaire Score:  Knowledge Questionnaire Score - 06/26/22 1455       Knowledge Questionnaire Score   Pre Score 16/18             Goals reviewed with  patient; copy given to patient.

## 2022-07-31 NOTE — Progress Notes (Signed)
Benjamin Torres called to say he would not be coming back to rehab. He states his breathing is not good and it takes him two days to recover from exercise. Patient discharged.

## 2022-08-01 DIAGNOSIS — Z79899 Other long term (current) drug therapy: Secondary | ICD-10-CM | POA: Diagnosis not present

## 2022-08-01 DIAGNOSIS — Z125 Encounter for screening for malignant neoplasm of prostate: Secondary | ICD-10-CM | POA: Diagnosis not present

## 2022-08-01 DIAGNOSIS — R739 Hyperglycemia, unspecified: Secondary | ICD-10-CM | POA: Diagnosis not present

## 2022-08-01 DIAGNOSIS — E782 Mixed hyperlipidemia: Secondary | ICD-10-CM | POA: Diagnosis not present

## 2022-08-12 DIAGNOSIS — Z1331 Encounter for screening for depression: Secondary | ICD-10-CM | POA: Diagnosis not present

## 2022-08-12 DIAGNOSIS — Z0000 Encounter for general adult medical examination without abnormal findings: Secondary | ICD-10-CM | POA: Diagnosis not present

## 2022-08-12 DIAGNOSIS — I2510 Atherosclerotic heart disease of native coronary artery without angina pectoris: Secondary | ICD-10-CM | POA: Diagnosis not present

## 2022-08-12 DIAGNOSIS — F17200 Nicotine dependence, unspecified, uncomplicated: Secondary | ICD-10-CM | POA: Diagnosis not present

## 2022-08-12 DIAGNOSIS — J449 Chronic obstructive pulmonary disease, unspecified: Secondary | ICD-10-CM | POA: Diagnosis not present

## 2022-08-12 DIAGNOSIS — R972 Elevated prostate specific antigen [PSA]: Secondary | ICD-10-CM | POA: Diagnosis not present

## 2022-08-14 ENCOUNTER — Encounter: Payer: Self-pay | Admitting: *Deleted

## 2022-08-14 DIAGNOSIS — J439 Emphysema, unspecified: Secondary | ICD-10-CM

## 2022-08-14 NOTE — Progress Notes (Signed)
Pulmonary Individual Treatment Plan  Patient Details  Name: Benjamin Torres MRN: [id number] Date of Birth: 10/19/1955 Referring Provider:   Flowsheet Row Pulmonary Rehab from 06/26/2022 in [facility]  Referring Provider Dgayli, Khabib, MD       Initial Encounter Date:  Flowsheet Row Pulmonary Rehab from 06/26/2022 in [facility]  Date 06/26/22       Visit Diagnosis: Pulmonary emphysema, unspecified emphysema type  Patient's Home Medications on Admission:  Current Outpatient Medications:    acetaminophen-codeine (TYLENOL #3) 300-30 MG tablet, Take 1 tablet by mouth as needed., Disp: , Rfl:    albuterol (PROVENTIL HFA;VENTOLIN HFA) 108 (90 Base) MCG/ACT inhaler, Inhale 1 puff into the lungs every 4 (four) hours as needed for wheezing or shortness of breath., Disp: , Rfl:    cyanocobalamin (,VITAMIN B-12,) 1000 MCG/ML injection, Inject into the muscle., Disp: , Rfl:    gabapentin (NEURONTIN) 100 MG capsule, Take 100 mg by mouth daily., Disp: , Rfl:    ibuprofen (ADVIL) 200 MG tablet, Take 400 mg by mouth as needed for headache., Disp: , Rfl:    levothyroxine (SYNTHROID) 75 MCG tablet, Take by mouth., Disp: , Rfl:    metoprolol succinate (TOPROL-XL) 25 MG 24 hr tablet, Take 25 mg by mouth daily., Disp: , Rfl:    montelukast (SINGULAIR) 10 MG tablet, Take 10 mg by mouth daily., Disp: , Rfl:    nicotine polacrilex (NICOTINE MINI) 2 MG lozenge, Take 1 lozenge (2 mg total) by mouth every 2 (two) hours as needed for smoking cessation., Disp: 72 lozenge, Rfl: 3   rosuvastatin (CRESTOR) 10 MG tablet, Take 10 mg by mouth at bedtime., Disp: , Rfl:    tamsulosin (FLOMAX) 0.4 MG CAPS capsule, Take 1 capsule (0.4 mg total) by mouth daily., Disp: 90 capsule, Rfl: 3   TRELEGY ELLIPTA 200-62.5-25 MCG/INH AEPB, Inhale 1 puff into the lungs daily at 2 PM. , Disp: , Rfl:  No current facility-administered medications for this visit.  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 06/13/2022.     Labs: Review Flowsheet       Latest Ref Rng & Units 08/10/2020  Labs for [facility] Cardiac and Pulmonary Rehab  Hemoglobin A1c 4.8 - 5.6 % 5.9      Pulmonary Assessment Scores:  Pulmonary Assessment Scores     Row Name 06/26/22 1453         ADL UCSD   ADL Phase Entry     SOB Score total 41     Rest 0     Walk 2     Stairs 3     Bath 2     Dress 1     Shop 2         CAT Score   CAT Score 19       mMRC Score   mMRC Score 3              UCSD: Self-administered rating of dyspnea associated with activities of daily living (ADLs) 6-point scale (0 = "not at all" to 5 = "maximal or unable to do because of breathlessness")  Scoring Scores range from 0 to 120.  Minimally important difference is 5 units  CAT: CAT can identify the health impairment of COPD patients and is better correlated with disease progression.  CAT has a scoring range of zero to 40. The CAT score is classified into four groups of low (less than 10), medium (10 - 20), high (21-30) and very high (31-40) based on the impact level of disease on health status. A CAT score over 10 suggests significant symptoms.  A worsening CAT score could be explained by an exacerbation, poor medication  adherence, poor inhaler technique, or progression of COPD or comorbid conditions.  CAT MCID is 2 points  mMRC: mMRC (Modified Medical Research Council) Dyspnea Scale is used to assess the degree of baseline functional disability in patients of respiratory disease due to dyspnea. No minimal important difference is established. A decrease in score of 1 point or greater is considered a positive change.   Pulmonary Function Assessment:   Exercise Target Goals: Exercise Program Goal: Individual exercise prescription set using results from initial 6 min walk test and THRR while considering  patient's activity barriers and safety.   Exercise Prescription Goal: Initial exercise prescription builds to 30-45 minutes a day of aerobic activity, 2-3 days per week.  Home exercise guidelines will be given to patient during program as part of exercise prescription that the participant will acknowledge.  Education: Aerobic Exercise: - Group verbal and visual presentation on the components of exercise prescription. Introduces F.I.T.T principle from ACSM for exercise prescriptions.  Reviews F.I.T.T. principles of aerobic exercise including progression. Written material given at graduation.   Education: Resistance Exercise: - Group verbal and visual presentation on the components of exercise prescription. Introduces F.I.T.T principle from ACSM for exercise prescriptions  Reviews F.I.T.T. principles of resistance exercise including progression. Written material given at graduation.    Education: Exercise & Equipment Safety: - Individual verbal instruction and demonstration of equipment use and safety with use of the equipment. Flowsheet Row Pulmonary Rehab from 07/03/2022 in [facility]  Date 06/26/22  Educator NT  Instruction Review Code 1- Verbalizes Understanding       Education: Exercise Physiology & General Exercise Guidelines: - Group verbal and written instruction with models  to review the exercise physiology of the cardiovascular system and associated critical values. Provides general exercise guidelines with specific guidelines to those with heart or lung disease.    Education: Flexibility, Balance, Mind/Body Relaxation: - Group verbal and visual presentation with interactive activity on the components of exercise prescription. Introduces F.I.T.T principle from ACSM for exercise prescriptions. Reviews F.I.T.T. principles of flexibility and balance exercise training including progression. Also discusses the mind body connection.  Reviews various relaxation techniques to help reduce and manage stress (i.e. Deep breathing, progressive muscle relaxation, and visualization). Balance handout provided to take home. Written material given at graduation.   Activity Barriers & Risk Stratification:  Activity Barriers & Cardiac Risk Stratification - 06/26/22 1510       Activity Barriers & Cardiac Risk Stratification   Activity Barriers Shortness of Breath   Hip pain              6 Minute Walk:  6 Minute Walk     Row Name 06/26/22 1507         6 Minute Walk   Phase Initial     Distance 700 feet     Walk Time 6 minutes     # of Rest Breaks 0     MPH 1.33     METS 2.05     RPE 12     Perceived Dyspnea  2     VO2 Peak 7.19     Symptoms Yes (comment)     Comments SOB     Resting HR 72 bpm     Resting BP 112/58     Resting Oxygen Saturation  95 %     Exercise Oxygen Saturation  during 6 min walk 90 %     Max Ex. HR 93 bpm     Max Ex. BP 128/66     2 Minute Post BP 116/62       Interval HR   1 Minute HR 87     2 Minute HR 92     3 Minute HR 92     4 Minute HR 92     5 Minute HR 93     6 Minute HR 90     2 Minute Post HR 78     Interval Heart Rate? Yes       Interval Oxygen   Interval Oxygen? Yes     Baseline Oxygen Saturation % 95 %     1 Minute Oxygen Saturation % 93 %     1 Minute Liters of Oxygen 0 L  RA     2 Minute Oxygen Saturation % 93 %      2 Minute Liters of Oxygen 0 L  RA     3 Minute Oxygen Saturation % 92 %     3 Minute Liters of Oxygen 0 L  RA     4 Minute Oxygen Saturation % 92 %     4 Minute Liters of Oxygen 0 L  RA     5 Minute Oxygen Saturation % 90 %     5 Minute Liters of Oxygen 0 L  RA     6 Minute Oxygen Saturation % 91 %     6 Minute Liters of Oxygen 0 L  RA     2 Minute Post Oxygen Saturation % 95 %     2 Minute Post Liters of Oxygen 0 L  RA             Oxygen Initial Assessment:  Oxygen Initial Assessment - 06/20/22 1333       Home Oxygen   Home Oxygen Device None    Sleep Oxygen Prescription None    Home Exercise Oxygen Prescription None    Home Resting Oxygen Prescription None      Intervention   Short Term Goals To learn and understand importance of monitoring SPO2 with pulse oximeter and demonstrate accurate use of the pulse oximeter.;To learn and understand importance of maintaining oxygen saturations>88%;To learn and demonstrate proper pursed lip breathing techniques or other breathing techniques. ;To learn and demonstrate proper use of respiratory medications    Long  Term Goals Verbalizes importance of monitoring SPO2 with pulse oximeter and return demonstration;Maintenance of O2 saturations>88%;Exhibits proper breathing techniques, such as pursed lip breathing or other method taught during program session;Compliance with respiratory medication             Oxygen   Re-Evaluation:  Oxygen Re-Evaluation     Row Name 07/01/22 1542             Goals/Expected Outcomes   Comments Reviewed PLB technique with pt.  Talked about how it works and it's importance in maintaining their exercise saturations.       Goals/Expected Outcomes Short: Become more profiecient at using PLB.   Long: Become independent at using PLB.                Oxygen Discharge (Final Oxygen Re-Evaluation):  Oxygen Re-Evaluation - 07/01/22 1542       Goals/Expected Outcomes   Comments Reviewed PLB  technique with pt.  Talked about how it works and it's importance in maintaining their exercise saturations.    Goals/Expected Outcomes Short: Become more profiecient at using PLB.   Long: Become independent at using PLB.             Initial Exercise Prescription:  Initial Exercise Prescription - 06/26/22 1500       Date of Initial Exercise RX and Referring Provider   Date 06/26/22    Referring Provider Dgayli, Khabib, MD      Oxygen   Maintain Oxygen Saturation 88% or higher      Recumbant Bike   Level 2    RPM 50    Watts 15    Minutes 15    METs 2.05      REL-XR   Level 1    Speed 50    Minutes 15    METs 2.05      T5 Nustep   Level 1    SPM 80    Minutes 15    METs 2.05      Track   Laps 19    Minutes 15    METs 2.03      Prescription Details   Frequency (times per week) 2    Duration Progress to 30 minutes of continuous aerobic without signs/symptoms of physical distress      Intensity   THRR 40-80% of Max Heartrate 104-136    Ratings of Perceived Exertion 11-13    Perceived Dyspnea 0-4      Progression   Progression Continue to progress workloads to maintain intensity without signs/symptoms of physical distress.      Resistance Training   Training Prescription Yes    Weight 3 lb    Reps 10-15             Perform Capillary Blood Glucose checks as needed.  Exercise Prescription Changes:   Exercise Prescription Changes     Row Name 06/26/22 1500 07/09/22 1400 07/23/22 1400         Response to Exercise   Blood Pressure (Admit) 112/58 112/72 122/64     Blood Pressure (Exercise) 128/66 152/78 122/64     Blood Pressure (Exit) 116/62 102/58 114/68     Heart Rate (Admit) 72 bpm 79 bpm 81 bpm     Heart Rate (Exercise) 93 bpm 96 bpm 98 bpm     Heart Rate (Exit) 78 bpm 80 bpm 90 bpm     Oxygen Saturation (Admit) 95 % 95 % 96 %     Oxygen Saturation (Exercise) 90 % 90 % 92 %     Oxygen Saturation (Exit) 95 % 94 % 94 %     Rating of  Perceived Exertion (Exercise) 12 15 13     Perceived Dyspnea (Exercise) 2 3 2     Symptoms SOB   SOB SOB     Comments 6MWT Results First two days of exercise --     Duration -- Continue with 30 min of aerobic exercise without signs/symptoms of physical distress. Continue with 30 min of aerobic exercise without signs/symptoms of physical distress.     Intensity -- THRR unchanged THRR unchanged       Progression   Progression -- Continue to progress workloads to maintain intensity without signs/symptoms of physical distress. Continue to progress workloads to maintain intensity without signs/symptoms of physical distress.     Average METs -- 3.19 2       Resistance Training   Training Prescription -- Yes Yes     Weight -- 3 lb 3 lb     Reps -- 10-15 10-15       Interval Training   Interval Training -- No No       REL-XR   Level -- 1 1     Minutes -- 15 15     METs -- 4.9 2.4       Track   Laps -- 19 11     Minutes -- 15 15     METs -- 2.03 1.6       Oxygen   Maintain Oxygen Saturation -- 88% or higher 88% or higher              Exercise Comments:   Exercise Comments     Row Name 07/01/22 1541           Exercise Comments First full day of exercise!  Patient was oriented to gym and equipment including functions, settings, policies, and procedures.  Patient's individual exercise prescription and treatment plan were reviewed.  All starting workloads were established based on the results of the 6 minute walk test done at initial orientation visit.  The plan for exercise progression was also introduced and progression will be customized based on patient's performance and goals.                Exercise Goals and Review:   Exercise Goals     Row Name 06/26/22 1510             Exercise Goals   Increase Physical Activity Yes       Intervention Provide advice, education, support and counseling about physical activity/exercise needs.;Develop an individualized  exercise prescription for aerobic and resistive training based on initial evaluation findings, risk stratification, comorbidities and participant's personal goals.       Expected Outcomes Short Term: Attend rehab on a regular basis to increase amount of physical activity.;Long Term: Add in home exercise to make exercise part of routine and to increase amount of physical activity.;Long Term: Exercising regularly at least 3-5 days a week.       Increase Strength and Stamina Yes       Intervention Provide advice, education, support and counseling about physical activity/exercise needs.;Develop an individualized exercise prescription for aerobic and resistive training based on initial evaluation findings, risk stratification, comorbidities and participant's personal goals.       Expected Outcomes Short Term: Increase workloads from initial exercise prescription for resistance, speed, and METs.;Short Term: Perform resistance training exercises routinely during rehab and add in resistance training at home;Long Term: Improve cardiorespiratory fitness, muscular endurance and strength as measured by increased METs and functional capacity (6MWT)       Able to understand and use rate of perceived exertion (RPE) scale Yes       Intervention Provide   education and explanation on how to use RPE scale       Expected Outcomes Short Term: Able to use RPE daily in rehab to express subjective intensity level;Long Term:  Able to use RPE to guide intensity level when exercising independently       Able to understand and use Dyspnea scale Yes       Intervention Provide education and explanation on how to use Dyspnea scale       Expected Outcomes Short Term: Able to use Dyspnea scale daily in rehab to express subjective sense of shortness of breath during exertion;Long Term: Able to use Dyspnea scale to guide intensity level when exercising independently       Knowledge and understanding of Target Heart Rate Range (THRR) Yes        Intervention Provide education and explanation of THRR including how the numbers were predicted and where they are located for reference       Expected Outcomes Short Term: Able to state/look up THRR;Long Term: Able to use THRR to govern intensity when exercising independently;Short Term: Able to use daily as guideline for intensity in rehab       Able to check pulse independently Yes       Intervention Provide education and demonstration on how to check pulse in carotid and radial arteries.;Review the importance of being able to check your own pulse for safety during independent exercise       Expected Outcomes Short Term: Able to explain why pulse checking is important during independent exercise;Long Term: Able to check pulse independently and accurately       Understanding of Exercise Prescription Yes       Intervention Provide education, explanation, and written materials on patient's individual exercise prescription       Expected Outcomes Short Term: Able to explain program exercise prescription;Long Term: Able to explain home exercise prescription to exercise independently                Exercise Goals Re-Evaluation :  Exercise Goals Re-Evaluation     Row Name 07/01/22 1541 07/09/22 1419 07/23/22 1446         Exercise Goal Re-Evaluation   Exercise Goals Review Increase Physical Activity;Able to understand and use rate of perceived exertion (RPE) scale;Knowledge and understanding of Target Heart Rate Range (THRR);Understanding of Exercise Prescription;Increase Strength and Stamina;Able to check pulse independently Increase Physical Activity;Increase Strength and Stamina;Understanding of Exercise Prescription Increase Physical Activity;Increase Strength and Stamina;Understanding of Exercise Prescription     Comments Reviewed RPE scale, THR and program prescription with pt today.  Pt voiced understanding and was given a copy of goals to take home. Boyde is off to a good start in the  program. He had an overall average MET level of 3.19 METs during his first two sessions of rehab. He also did well with level 1 on the XR and walked up to 19 laps on the track. He is still experiencing quite a bit of SOB as he has to stop while walking to catch his breath, and he rated his breathing at a 3 on the dyspnea scale. We will continue to monitor his progress in the program. Xiomar has only completed 1 session since last review as he has been out for Dr. appts and not feeling well. He walked 11 laps on the track and worked up to 2.4 METS on the XR. We hope to see more consistent attendance and yiled better results. Will continue to monitor.       Expected Outcomes Short: Use RPE daily to regulate intensity.  Long: Follow program prescription in THR. Short: Begin to push for more laps on the track. Long: Continue to improve strength and stamina. Short: Maintain good attendance Long: Continue to increase overall MET level and stamina              Discharge Exercise Prescription (Final Exercise Prescription Changes):  Exercise Prescription Changes - 07/23/22 1400       Response to Exercise   Blood Pressure (Admit) 122/64    Blood Pressure (Exercise) 122/64    Blood Pressure (Exit) 114/68    Heart Rate (Admit) 81 bpm    Heart Rate (Exercise) 98 bpm    Heart Rate (Exit) 90 bpm    Oxygen Saturation (Admit) 96 %    Oxygen Saturation (Exercise) 92 %    Oxygen Saturation (Exit) 94 %    Rating of Perceived Exertion (Exercise) 13    Perceived Dyspnea (Exercise) 2    Symptoms SOB    Duration Continue with 30 min of aerobic exercise without signs/symptoms of physical distress.    Intensity THRR unchanged      Progression   Progression Continue to progress workloads to maintain intensity without signs/symptoms of physical distress.    Average METs 2      Resistance Training   Training Prescription Yes    Weight 3 lb    Reps 10-15      Interval Training   Interval Training No       REL-XR   Level 1    Minutes 15    METs 2.4      Track   Laps 11    Minutes 15    METs 1.6      Oxygen   Maintain Oxygen Saturation 88% or higher             Nutrition:  Target Goals: Understanding of nutrition guidelines, daily intake of sodium <1500mg, cholesterol <200mg, calories 30% from fat and 7% or less from saturated fats, daily to have 5 or more servings of fruits and vegetables.  Education: All About Nutrition: -Group instruction provided by verbal, written material, interactive activities, discussions, models, and posters to present general guidelines for heart healthy nutrition including fat, fiber, MyPlate, the role of sodium in heart healthy nutrition, utilization of the nutrition label, and utilization of this knowledge for meal planning. Follow up email sent as well. Written material given at graduation.   Biometrics:  Pre Biometrics - 06/26/22 1511       Pre Biometrics   Height 5' 6.5" (1.689 m)    Weight 158 lb (71.7 kg)    Waist Circumference 35 inches    Hip Circumference 37 inches    Waist to Hip Ratio 0.95 %    BMI (Calculated) 25.12    Single Leg Stand 14.2 seconds              Nutrition Therapy Plan and Nutrition Goals:  Nutrition Therapy & Goals - 07/15/22 1545       Nutrition Therapy   RD appointment deferred Yes   Sidney would not like to meet with the RD.     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             Nutrition Assessments:  MEDIFICTS Score Key: ?70 Need  to make dietary changes  40-70 Heart Healthy Diet ? 40 Therapeutic Level Cholesterol Diet  Flowsheet Row Pulmonary Rehab from 06/26/2022 in [facility]  Picture Your Plate Total Score on Admission 36      Picture Your Plate Scores: <40 Unhealthy dietary pattern with much room for improvement. 41-50 Dietary pattern unlikely to meet recommendations for good health and room for improvement. 51-60 More healthful dietary pattern, with some room for improvement.  >60 Healthy dietary pattern, although there may be some specific behaviors that could be improved.   Nutrition Goals Re-Evaluation:   Nutrition Goals Discharge (Final Nutrition Goals Re-Evaluation):   Psychosocial: Target Goals: Acknowledge presence or absence of significant depression and/or stress, maximize coping skills, provide positive support system. Participant is able to verbalize types and ability to use techniques and skills needed for reducing stress and depression.   Education: Stress, Anxiety, and Depression - Group verbal and visual presentation to define topics covered.  Reviews how body is impacted by stress, anxiety, and depression.  Also discusses healthy ways to reduce stress and to treat/manage anxiety and depression.  Written material given at graduation.   Education: Sleep Hygiene -Provides group verbal and written instruction about how sleep can affect your health.  Define sleep hygiene, discuss sleep cycles and impact of sleep habits. Review good sleep hygiene tips.    Initial Review & Psychosocial Screening:  Initial Psych Review & Screening - 06/20/22 1336       Initial Review   Current issues with None Identified      Family Dynamics   Good Support System? Yes   wife     Barriers   Psychosocial barriers to participate in program There are no identifiable barriers or psychosocial needs.;The patient should benefit from training in stress management and relaxation.       Screening Interventions   Interventions Encouraged to exercise;Provide feedback about the scores to participant;To provide support and resources with identified psychosocial needs    Expected Outcomes Short Term goal: Utilizing psychosocial counselor, staff and physician to assist with identification of specific Stressors or current issues interfering with healing process. Setting desired goal for each stressor or current issue identified.;Long Term Goal: Stressors or current issues are controlled or eliminated.;Short Term goal: Identification and review with participant of any Quality of Life or Depression concerns found by scoring the questionnaire.;Long Term goal: The participant improves quality of Life and PHQ9 Scores as seen by post scores and/or verbalization of changes             Quality of Life Scores:  Scores of 19 and below usually indicate a poorer quality of life in these areas.  A difference of  2-3 points is a clinically meaningful difference.  A difference of 2-3 points in the total score of the Quality of Life Index has been associated with significant improvement in overall quality of life, self-image, physical symptoms, and general health in studies assessing change in quality of life.  PHQ-9: Review Flowsheet       06/26/2022  Depression screen PHQ 2/9  Decreased Interest 0  Down, Depressed, Hopeless 0  PHQ - 2 Score 0    Altered sleeping 0  Tired, decreased energy 1  Change in appetite 0  Feeling bad or failure about yourself  0  Trouble concentrating 0  Moving slowly or fidgety/restless 0  Suicidal thoughts 0  PHQ-9 Score 1  Difficult doing work/chores Not difficult at all   Interpretation of Total Score  Total Score Depression Severity:  1-4 = Minimal depression, 5-9 = Mild depression, 10-14 = Moderate depression, 15-19 = Moderately severe depression, 20-27 = Severe depression   Psychosocial Evaluation and Intervention:  Psychosocial Evaluation - 06/20/22  1343       Psychosocial Evaluation & Interventions   Interventions Encouraged to exercise with the program and follow exercise prescription    Comments Sayeed is coming to Pulmonary Rehab for emphysema. He has noticed his breathing has gotten worse over the last few months, example being that now he has to catch his breath at the top of his three step entry. He is tired of feeling short of breath all the time. He wants to quit smoking and picked up the lozenges and patches this week. He was informed about the lifestyle aspect of quitting and is thankful for the help. He does not report any current stress concerns. His wife is his main support system.    Expected Outcomes Short: attend pulmonary rehab for education and exercise. Long: develop and maintain positive self care habits.    Continue Psychosocial Services  Follow up required by staff             Psychosocial Re-Evaluation:   Psychosocial Discharge (Final Psychosocial Re-Evaluation):   Education: Education Goals: Education classes will be provided on a weekly basis, covering required topics. Participant will state understanding/return demonstration of topics presented.  Learning Barriers/Preferences:  Learning Barriers/Preferences - 06/20/22 1336       Learning Barriers/Preferences   Learning Barriers None    Learning Preferences Individual Instruction             General Pulmonary Education Topics:  Infection Prevention: - Provides verbal and written material to individual with discussion of infection control including proper hand washing and proper equipment cleaning during exercise session. Flowsheet Row Pulmonary Rehab from 07/03/2022 in [facility]  Date 06/26/22  Educator NT  Instruction Review Code 1- Verbalizes Understanding       Falls Prevention: - Provides verbal and written material to individual with discussion of falls prevention and safety. Flowsheet Row Pulmonary Rehab  from 07/03/2022 in [facility]  Date 06/26/22  Educator NT  Instruction Review Code 1- Verbalizes Understanding       Chronic Lung Disease Review: - Group verbal instruction with posters, models, PowerPoint presentations and videos,  to review new updates, new respiratory medications, new advancements in procedures and treatments. Providing information on websites and "800" numbers for continued self-education. Includes information about supplement oxygen, available portable oxygen systems, continuous and intermittent flow rates, oxygen safety, concentrators, and Medicare reimbursement for oxygen. Explanation of Pulmonary Drugs, including class, frequency, complications, importance of spacers, rinsing mouth after steroid MDI's, and proper cleaning methods for nebulizers. Review of basic lung anatomy and physiology related to function, structure, and complications of lung disease. Review of risk factors. Discussion about methods for diagnosing sleep apnea and types of masks and machines for OSA. Includes a review of the use of types of environmental controls: home humidity, furnaces, filters, dust mite/pet prevention, HEPA vacuums. Discussion about weather changes, air quality and the benefits of nasal washing. Instruction on Warning signs,   infection symptoms, calling MD promptly, preventive modes, and value of vaccinations. Review of effective airway clearance, coughing and/or vibration techniques. Emphasizing that all should Create an Action Plan. Written material given at graduation. Flowsheet Row Pulmonary Rehab from 07/03/2022 in [facility]  Date 07/03/22  Educator JH  Instruction Review Code 1- Verbalizes Understanding       AED/CPR: - Group verbal and written instruction with the use of models to demonstrate the basic use of the AED with the basic ABC's of resuscitation.    Anatomy and Cardiac Procedures: - Group verbal and visual presentation and  models provide information about basic cardiac anatomy and function. Reviews the testing methods done to diagnose heart disease and the outcomes of the test results. Describes the treatment choices: Medical Management, Angioplasty, or Coronary Bypass Surgery for treating various heart conditions including Myocardial Infarction, Angina, Valve Disease, and Cardiac Arrhythmias.  Written material given at graduation.   Medication Safety: - Group verbal and visual instruction to review commonly prescribed medications for heart and lung disease. Reviews the medication, class of the drug, and side effects. Includes the steps to properly store meds and maintain the prescription regimen.  Written material given at graduation.   Other: -Provides group and verbal instruction on various topics (see comments)   Knowledge Questionnaire Score:  Knowledge Questionnaire Score - 06/26/22 1455       Knowledge Questionnaire Score   Pre Score 16/18              Core Components/Risk Factors/Patient Goals at Admission:  Personal Goals and Risk Factors at Admission - 06/26/22 1457       Core Components/Risk Factors/Patient Goals on Admission    Weight Management Yes;Weight Maintenance    Intervention Weight Management: Develop a combined nutrition and exercise program designed to reach desired caloric intake, while maintaining appropriate intake of nutrient and fiber, sodium and fats, and appropriate energy expenditure required for the weight goal.;Weight Management: Provide education and appropriate resources to help participant work on and attain dietary goals.    Admit Weight 158 lb (71.7 kg)    Goal Weight: Long Term 160 lb (72.6 kg)    Expected Outcomes Long Term: Adherence to nutrition and physical activity/exercise program aimed toward attainment of established weight goal;Short Term: Continue to assess and modify interventions until short term weight is achieved;Weight Maintenance: Understanding of  the daily nutrition guidelines, which includes 25-35% calories from fat, 7% or less cal from saturated fats, less than 200mg cholesterol, less than 1.5gm of sodium, & 5 or more servings of fruits and vegetables daily;Understanding recommendations for meals to include 15-35% energy as protein, 25-35% energy from fat, 35-60% energy from carbohydrates, less than 200mg of dietary cholesterol, 20-35 gm of total fiber daily;Understanding of distribution of calorie intake throughout the day with the consumption of 4-5 meals/snacks    Tobacco Cessation Yes    Number of packs per day 1    Intervention Assist the participant in steps to quit. Provide individualized education and counseling about committing to Tobacco Cessation, relapse prevention, and pharmacological support that can be provided by physician.;Offer self-teaching materials, assist with locating and accessing local/national Quit Smoking programs, and support quit date choice.    Expected Outcomes Short Term: Will demonstrate readiness to quit, by selecting a quit date.;Short Term: Will quit all tobacco product use, adhering to prevention of relapse plan.;Long Term: Complete abstinence from all tobacco products for at least 12 months from quit date.    Improve   shortness of breath with ADL's Yes    Intervention Provide education, individualized exercise plan and daily activity instruction to help decrease symptoms of SOB with activities of daily living.    Expected Outcomes Short Term: Improve cardiorespiratory fitness to achieve a reduction of symptoms when performing ADLs;Long Term: Be able to perform more ADLs without symptoms or delay the onset of symptoms    Lipids Yes    Intervention Provide education and support for participant on nutrition & aerobic/resistive exercise along with prescribed medications to achieve LDL <70mg, HDL >40mg.    Expected Outcomes Short Term: Participant states understanding of desired cholesterol values and is compliant  with medications prescribed. Participant is following exercise prescription and nutrition guidelines.;Long Term: Cholesterol controlled with medications as prescribed, with individualized exercise RX and with personalized nutrition plan. Value goals: LDL < 70mg, HDL > 40 mg.             Education:Diabetes - Individual verbal and written instruction to review signs/symptoms of diabetes, desired ranges of glucose level fasting, after meals and with exercise. Acknowledge that pre and post exercise glucose checks will be done for 3 sessions at entry of program.   Know Your Numbers and Heart Failure: - Group verbal and visual instruction to discuss disease risk factors for cardiac and pulmonary disease and treatment options.  Reviews associated critical values for Overweight/Obesity, Hypertension, Cholesterol, and Diabetes.  Discusses basics of heart failure: signs/symptoms and treatments.  Introduces Heart Failure Zone chart for action plan for heart failure.  Written material given at graduation. Flowsheet Row Pulmonary Rehab from 07/03/2022 in [facility]  Education need identified 06/26/22       Core Components/Risk Factors/Patient Goals Review:    Core Components/Risk Factors/Patient Goals at Discharge (Final Review):    ITP Comments:  ITP Comments     Row Name 06/20/22 1343 06/26/22 1501 07/01/22 1540 07/17/22 0826 07/25/22 1533   ITP Comments Initial phone call completed. Diagnosis can be found in [facility] 2/15. EP Orientation scheduled for Wednesday 2/28 at 1:30.    Askari is a current tobacco user. Intervention for tobacco cessation was provided at the initial medical review. He was asked about readiness to quit and reported that he picked up the lozenges and patches today with the plan to try them out this week . Patient was advised and educated about tobacco cessation using combination therapy, tobacco cessation classes, quit line, and quit smoking apps. Patient  demonstrated understanding of this material. Staff will continue to provide encouragement and follow up with the patient throughout the program. Completed 6MWT and gym orientation. Initial ITP created and sent for review to Dr. Fuad Aleskerov, Medical Director. First full day of exercise!  Patient was oriented to gym and equipment including functions, settings, policies, and procedures.  Patient's individual exercise prescription and treatment plan were reviewed.  All starting workloads were established based on the results of the 6 minute walk test done at initial orientation visit.  The plan for exercise progression was also introduced and progression will be customized based on patient's performance and goals. 30 Day review completed. Medical Director ITP review done, changes made as directed, and signed approval by Medical Director.     new to program Patient out of rehab due to cold/flu symptoms starting on 3/21. Negative covid test. Told patient he cannot return until fully symptom free/ no fever. Return 4/1 unless he does not feel well he needs to call to let us know.      Row Name 07/31/22 1415           ITP Comments Cornie called to say he would not be coming back to rehab. He states his breathing is not good and it takes him two days to recover from exercise. Patient discharged.                Comments: Discharge ITP.  

## 2022-08-26 ENCOUNTER — Ambulatory Visit
Admission: RE | Admit: 2022-08-26 | Discharge: 2022-08-26 | Disposition: A | Discharge status: Home / Self Care | Payer: Medicare HMO | Source: Ambulatory Visit | Attending: Oncology | Admitting: Oncology

## 2022-08-26 DIAGNOSIS — R911 Solitary pulmonary nodule: Secondary | ICD-10-CM | POA: Diagnosis not present

## 2022-08-26 DIAGNOSIS — J439 Emphysema, unspecified: Secondary | ICD-10-CM | POA: Diagnosis not present

## 2022-08-26 DIAGNOSIS — R918 Other nonspecific abnormal finding of lung field: Secondary | ICD-10-CM | POA: Diagnosis not present

## 2022-09-02 ENCOUNTER — Inpatient Hospital Stay: Payer: Medicare HMO | Attending: Oncology | Admitting: Oncology

## 2022-09-02 ENCOUNTER — Encounter: Payer: Self-pay | Admitting: Oncology

## 2022-09-02 VITALS — BP 112/83 | HR 80 | Temp 96.3°F | Resp 18 | Wt 156.4 lb

## 2022-09-02 DIAGNOSIS — E039 Hypothyroidism, unspecified: Secondary | ICD-10-CM | POA: Diagnosis not present

## 2022-09-02 DIAGNOSIS — R911 Solitary pulmonary nodule: Secondary | ICD-10-CM | POA: Diagnosis not present

## 2022-09-02 DIAGNOSIS — R918 Other nonspecific abnormal finding of lung field: Secondary | ICD-10-CM | POA: Insufficient documentation

## 2022-09-02 DIAGNOSIS — C329 Malignant neoplasm of larynx, unspecified: Secondary | ICD-10-CM

## 2022-09-02 DIAGNOSIS — Z720 Tobacco use: Secondary | ICD-10-CM | POA: Diagnosis not present

## 2022-09-02 DIAGNOSIS — Z8521 Personal history of malignant neoplasm of larynx: Secondary | ICD-10-CM | POA: Diagnosis not present

## 2022-09-02 NOTE — Assessment & Plan Note (Addendum)
Discussed about smoke cessation.  Patient is motivated

## 2022-09-02 NOTE — Progress Notes (Signed)
Hematology/Oncology Progress note [phone] [phone]      Clinic Day:  11/03/2019  ASSESSMENT & PLAN:   Cancer of larynx (HCC) # Stage III supraglottic larynx cancer Clinically he is doing well.  He is 9 year post completion of treatment. Monitor thyroid function annually.    Lung nodule # Lung nodules.  Imaging was reviewed and discussed with patient.  05/23/22 CT scan showed New 8 x 5 mm RIGHT upper lobe pulmonary nodule, new right middle lobe nodules, wax and wane other nodules, chronic infection/inflammation vs malignancy 08/29/22 CT showed stable nodules, probably benign.  Continue annual CT chest wo contrast   Tobacco use Discussed about smoke cessation.  Patient is motivated  Acquired hypothyroidism TSH and T4 were stable, continue synthroid daily- managed by Dr. Hyacinth Meeker   Orders Placed This Encounter  Procedures   CT Chest Wo Contrast    Standing Status:   Future    Standing Expiration Date:   09/02/2023    Order Specific Question:   Preferred imaging location?    Answer:   [facility]   CMP (Cancer Center only)    Standing Status:   Future    Standing Expiration Date:   09/02/2023   CBC with Differential (Cancer Center Only)    Standing Status:   Future    Standing Expiration Date:   09/02/2023   TSH    Standing Status:   Future    Standing Expiration Date:   09/02/2023   Follow-up in 1 year All questions were answered. The patient knows to call the clinic with any problems, questions or concerns.  Rickard Patience, MD, PhD [facility] Hematology Oncology 09/02/2022      Chief Complaint: Benjamin Torres is a 67 y.o. 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  PERTINENT ONCOLOGY HISTORY Patient previously followed up by Dr.Corcoran, patient switched care to me on 11/02/20 Extensive medical record review was performed by me  # April 2015, stage III supraglottic larynx cancer s/p concurrent  cetuximab and radiation.  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 PET scan on 08/18/2013 revealed hypermetabolic soft tissue lesion in the right hypopharynx consistent with epiglottic lesion. There also was a high right paratracheal lymph node which was also hypermetabolic and suspicious for metastatic disease.  Clinical stage was T2N1M0.   He was treated with radiation therapy and cetuximab beginning 09/07/2013.  He received 7,000 cGy from 09/07/2013 - 11/10/2013.     06/21/2015 PET scan on  revealed no evidence of recurrent disease.  02/12/2017 PET revealed no evidence of hypermetabolic locoregional or distant metastatic disease.  11/2016 ENT evaluation iwas negative per patient.   He has hypothyroidism.  He is on Synthroid.   He has a posterior bladder diverticulum and prostatomegaly, which causes him to experience LUTS. PSA was 1.91 on 01/13/2017.   EGD on 03/07/2017 revealed a medium sized hiatal hernia, moderate inflammation in the stomach, and mild inflammation in the duodenal bulb. Findings c/w gastritis and duodenitis. Pathology demonstrated chronic inactive gastritis. Negative for H.pylori.    Colonoscopy on 03/07/2017 revealed two 4-5 mm sessile polyps in the descending colon, one 2 mm polyp in the sigmoid colon, diverticulosis, and non-bleeding internal hemorrhoids. Pathology showed that the 2 polyps from the descending colon were tubular adenomas, and the lesion from the sigmoid was a hyperplastic polyp.   April 2022 hospitalized due to pneumonia.   07/31/21 CT lung cancer screening- 1. Lung-RADS 4A,  suspicious.  Left lower lobe nudule 7mm, left upper lobe nodule 8.45mm  10/31/21 CT lung cancer screening- 1. Lung-RADS 4A, suspicious.  left upper lobe nodule 8.74mm, new left upper lobe nodule 7.54mm, new 7 mm right upper lobe perifissural nodule   11/14/2021, PET scan showed no hypermetabolic pulmonary nodules, majority of the nodules are just at or  below the resolution of PET scan.   02/18/2022, CT scan showed interval development of several new lung nodules and slight increase of previously seen nodules.  Unclear etiology.  Nodules are very small and likely will PET scan detectable sensitivity.  INTERVAL HISTORY Benjamin Torres is a 67 y.o. 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 He continues smoking and vaping.   Past Medical History:  Diagnosis Date   Cancer of larynx (HCC) 11/06/2014   COPD (chronic obstructive pulmonary disease) (HCC)    Hypothyroidism    PE (pulmonary thromboembolism) (HCC)    right lung, over 10 yrs ago    Past Surgical History:  Procedure Laterality Date   ANAL FISSURE REPAIR     APPENDECTOMY     BIOPSY PHARYNX  2015   Larynx BX   COLONOSCOPY WITH PROPOFOL N/A 03/07/2017   Procedure: COLONOSCOPY WITH PROPOFOL;  Surgeon: Midge Minium, MD;  Location: [facility];  Service: Endoscopy;  Laterality: N/A;   ESOPHAGOGASTRODUODENOSCOPY (EGD) WITH PROPOFOL N/A 03/07/2017   Procedure: ESOPHAGOGASTRODUODENOSCOPY (EGD) WITH PROPOFOL;  Surgeon: Midge Minium, MD;  Location: [facility];  Service: Endoscopy;  Laterality: N/A;   LEFT HEART CATH AND CORONARY ANGIOGRAPHY Left 09/23/2019   Procedure: LEFT HEART CATH AND CORONARY ANGIOGRAPHY;  Surgeon: Dalia Heading, MD;  Location: [facility] INVASIVE CV LAB;  Service: Cardiovascular;  Laterality: Left;   POLYPECTOMY  03/07/2017   Procedure: POLYPECTOMY;  Surgeon: Midge Minium, MD;  Location: [facility];  Service: Endoscopy;;    History reviewed. No pertinent family history.  Social History:  reports that he has been smoking e-cigarettes and cigarettes. He has a 60.00 pack-year smoking history. He has never used smokeless tobacco. He reports that he does not drink alcohol. No history on file for drug use.   Allergies: No Known Allergies  Current Medications: Current Outpatient Medications  Medication Sig Dispense Refill   acetaminophen-codeine (TYLENOL #3) 300-30 MG tablet Take 1 tablet by mouth as needed.     albuterol (PROVENTIL HFA;VENTOLIN HFA) 108 (90 Base) MCG/ACT inhaler Inhale 1 puff into the lungs every 4 (four) hours as needed for wheezing or shortness of breath.     cyanocobalamin (,VITAMIN B-12,) 1000 MCG/ML injection Inject 1,000 mcg into the muscle every 30 (thirty) days.     gabapentin (NEURONTIN) 100 MG capsule Take 100 mg by mouth daily.     ibuprofen (ADVIL) 200 MG tablet Take 400 mg by mouth as needed for headache.     levothyroxine (SYNTHROID) 75 MCG tablet Take by mouth.     metoprolol succinate (TOPROL-XL) 25 MG 24 hr tablet Take 25 mg by mouth daily.     montelukast (SINGULAIR) 10 MG tablet Take 10 mg by mouth daily.     rosuvastatin (CRESTOR) 10 MG tablet Take 10 mg by mouth at bedtime.     tamsulosin (FLOMAX) 0.4 MG CAPS capsule Take 1 capsule (0.4 mg total) by mouth daily. 90 capsule 3   TRELEGY ELLIPTA 200-62.5-25 MCG/INH AEPB Inhale  1 puff into the lungs daily at 2 PM.      No current facility-administered medications for this visit.   Facility-Administered Medications Ordered in Other Visits  Medication Dose Route Frequency Provider Last Rate Last Admin   sodium chloride flush (NS) 0.9 % injection 3 mL  3 mL Intravenous Q12H Dalia Heading, MD        Review of Systems  Constitutional: Negative.  Negative for chills, diaphoresis, fever, malaise/fatigue and weight loss (up 10 pound).  HENT: Negative.  Negative for congestion, ear discharge, ear pain, hearing loss, nosebleeds, sinus pain, sore throat and tinnitus.   Eyes: Negative.  Negative for blurred vision, double vision, photophobia and pain.  Respiratory:  Positive for shortness of breath. Negative for cough, hemoptysis, sputum production and wheezing.   Cardiovascular: Negative.  Negative for chest pain,  palpitations, orthopnea, leg swelling and PND.  Gastrointestinal: Negative.  Negative for abdominal pain, blood in stool, constipation, diarrhea, heartburn, melena, nausea and vomiting.  Genitourinary: Negative.  Negative for dysuria, frequency, hematuria and urgency.       LUTS; difficulty initiating stream, voids small amounts, nocturia. Known prostatomegaly and posterior bladder diverticulum; sees urology  Musculoskeletal: Negative.  Negative for back pain, falls, joint pain, myalgias and neck pain.  Skin: Negative.  Negative for itching and rash.  Neurological: Negative.  Negative for dizziness, tremors, speech change, focal weakness, weakness and headaches.  Endo/Heme/Allergies: Negative.  Does not bruise/bleed easily.       Hypothyroidism on levothyroxine.  Psychiatric/Behavioral:  Negative for depression, memory loss and suicidal ideas. The patient is not nervous/anxious and does not have insomnia.   All other systems reviewed and are negative.   Performance status (ECOG): 0 Vitals:   09/02/22 1309  BP: 112/83  Pulse: 80  Resp: 18  Temp: (!) 96.3 F (35.7 C)  SpO2: 99%     Physical Exam Constitutional:      General: He is not in acute distress.    Appearance: He is not diaphoretic.  HENT:     Head: Normocephalic and atraumatic.     Nose: Nose normal.     Mouth/Throat:     Pharynx: No oropharyngeal exudate.  Eyes:     General: No scleral icterus.    Pupils: Pupils are equal, round, and reactive to light.  Cardiovascular:     Rate and Rhythm: Normal rate and regular rhythm.     Heart sounds: No murmur heard. Pulmonary:     Effort: Pulmonary effort is normal. No respiratory distress.     Breath sounds: No rales.     Comments: Decreased breath sounds bilaterally. Chest:     Chest wall: No tenderness.  Abdominal:     General: There is no distension.     Palpations: Abdomen is soft.     Tenderness: There is no abdominal tenderness.  Musculoskeletal:        General:  Normal range of motion.     Cervical back: Normal range of motion and neck supple.  Skin:    General: Skin is warm and dry.     Findings: No erythema.  Neurological:     Mental Status: He is alert and oriented to person, place, and time.     Cranial Nerves: No cranial nerve deficit.     Motor: No abnormal muscle tone.     Coordination: Coordination normal.  Psychiatric:        Mood and Affect: Affect normal.    Labs    Latest  Ref Rng & Units 11/01/2021    1:53 PM 11/02/2020    1:27 PM 08/11/2020    5:02 AM  CBC  WBC 4.0 - 10.5 K/uL 8.0  7.7  9.8   Hemoglobin 13.0 - 17.0 g/dL 40.9  81.1  91.4   Hematocrit 39.0 - 52.0 % 46.8  44.9  36.8   Platelets 150 - 400 K/uL 195  217  207       Latest Ref Rng & Units 12/27/2021    5:15 PM 11/01/2021    1:53 PM 11/02/2020    1:27 PM  CMP  Glucose 70 - 99 mg/dL  782  956   BUN 8 - 23 mg/dL  12  12   Creatinine 2.13 - 1.24 mg/dL 0.86  5.78  4.69   Sodium 135 - 145 mmol/L  134  134   Potassium 3.5 - 5.1 mmol/L  4.6  4.4   Chloride 98 - 111 mmol/L  101  103   CO2 22 - 32 mmol/L  27  24   Calcium 8.9 - 10.3 mg/dL  8.7  9.1   Total Protein 6.5 - 8.1 g/dL  7.5  7.4   Total Bilirubin 0.3 - 1.2 mg/dL  0.9  0.9   Alkaline Phos 38 - 126 U/L  50  53   AST 15 - 41 U/L  22  21   ALT 0 - 44 U/L  17  16    RADIOGRAPHIC STUDIES: I have personally reviewed the radiological images as listed and agreed with the findings in the report.  CT Chest Wo Contrast  Result Date: 08/29/2022 CLINICAL DATA:  67 year old male with history of laryngeal cancer diagnosed in 2015 treated with radiation therapy and chemotherapy. Pulmonary nodules. Follow-up study. * Tracking Code: BO * EXAM: CT CHEST WITHOUT CONTRAST TECHNIQUE: Multidetector CT imaging of the chest was performed following the standard protocol without IV contrast. RADIATION DOSE REDUCTION: This exam was performed according to the departmental dose-optimization program which includes automated exposure control,  adjustment of the mA and/or kV according to patient size and/or use of iterative reconstruction technique. COMPARISON:  Chest CT 05/23/2022. FINDINGS: Cardiovascular: Heart size is normal. There is no significant pericardial fluid, thickening or pericardial calcification. There is aortic atherosclerosis, as well as atherosclerosis of the great vessels of the mediastinum and the coronary arteries, including calcified atherosclerotic plaque in the left main, left anterior descending, left circumflex and right coronary arteries. Mediastinum/Nodes: No pathologically enlarged mediastinal or hilar lymph nodes. Please note that accurate exclusion of hilar adenopathy is limited on noncontrast CT scans. Small hiatal hernia. No axillary lymphadenopathy. Lungs/Pleura: Previously noted right upper lobe pulmonary nodule (axial image 32 of series 3) has slightly decreased in size compared to the prior study, currently measuring 5 x 3 mm (previously 8 x 5 mm), indicative of a benign etiology. Nodular area of architectural distortion in the posterior aspect of the left upper lobe (axial image 28 of series 3) measuring 1.3 x 0.6 cm, similar in prior examinations dating back to 07/30/2021, previously not hypermetabolic on prior PET-CT, presumably a benign area of chronic post infectious or inflammatory scarring. Several other smaller pulmonary nodules are stable in size and number compared to prior examinations, measuring 5 mm or less in size, presumably benign. No other new suspicious appearing pulmonary nodules or masses are noted. Diffuse bronchial wall thickening with moderate centrilobular and paraseptal emphysema. Upper Abdomen: 6 mm calcified gallstone in the gallbladder. 2 mm nonobstructive calculus in the interpolar  collecting system of the left kidney. Atherosclerotic calcifications in the abdominal aorta. Musculoskeletal: There are no aggressive appearing lytic or blastic lesions noted in the visualized portions of the  skeleton. IMPRESSION: 1. Numerous pulmonary nodules either decreased in size or stable compared to prior examinations, considered benign. No new suspicious appearing pulmonary nodules or masses to suggest metastatic disease to the lungs at this time. 2. Diffuse bronchial wall thickening with moderate centrilobular and paraseptal emphysema; imaging findings suggestive of underlying COPD. 3. Aortic atherosclerosis, in addition to left main and three-vessel coronary artery disease. Please note that although the presence of coronary artery calcium documents the presence of coronary artery disease, the severity of this disease and any potential stenosis cannot be assessed on this non-gated CT examination. Assessment for potential risk factor modification, dietary therapy or pharmacologic therapy may be warranted, if clinically indicated. 4. Cholelithiasis. 5. 2 mm nonobstructive calculus in the interpolar collecting system of left kidney. Aortic Atherosclerosis (ICD10-I70.0) and Emphysema (ICD10-J43.9). Electronically Signed   By: Trudie Reed M.D.   On: 08/29/2022 09:39   ECHOCARDIOGRAM COMPLETE  Result Date: 07/25/2022    ECHOCARDIOGRAM REPORT   Patient Name:   Benjamin Torres Date of Exam: 07/25/2022 Medical Rec #:  [id number]        Height:       66.5 in Accession #:    [id number]       Weight:       158.0 lb Date of Birth:  1956-04-25         BSA:          1.819 m Patient Age:    66 years         BP:           140/90 mmHg Patient Gender: M                HR:           81 bpm. Exam Location:  [facility] Procedure: 2D Echo, 3D Echo, Cardiac Doppler, Color Doppler and Strain Analysis Indications:    R06.02 SOB  History:        Patient has no prior history of Echocardiogram examinations.                 CAD, Signs/Symptoms:Shortness of Breath and Dyspnea; Risk                 Factors:Current Smoker and Dyslipidemia.  Sonographer:    Rolland Porter Referring Phys: [id number] Antonieta Iba IMPRESSIONS  1. Left ventricular  ejection fraction, by estimation, is 45 to 50%. Left ventricular ejection fraction by 3D volume is 49 %. Left ventricular ejection fraction by 2D MOD biplane is 45.3 %. The left ventricle has mildly decreased function. The left ventricle has no regional wall motion abnormalities. There is mild left ventricular hypertrophy. Left ventricular diastolic parameters are consistent with Grade I diastolic dysfunction (impaired relaxation).  2. Right ventricular systolic function is normal. The right ventricular size is normal. There is normal pulmonary artery systolic pressure. The estimated right ventricular systolic pressure is 34.6 mmHg.  3. The mitral valve is normal in structure. Mild mitral valve regurgitation. No evidence of mitral stenosis.  4. The aortic valve is tricuspid. Aortic valve regurgitation is not visualized. No aortic stenosis is present.  5. The inferior vena cava is normal in size with greater than 50% respiratory variability, suggesting right atrial pressure of 3 mmHg. FINDINGS  Left Ventricle: Left ventricular ejection fraction, by estimation, is 45  to 50%. Left ventricular ejection fraction by 2D MOD biplane is 45.3 %. Left ventricular ejection fraction by 3D volume is 49 %. The left ventricle has mildly decreased function. The left ventricle has no regional wall motion abnormalities. The left ventricular internal cavity size was normal in size. There is mild left ventricular hypertrophy. Left ventricular diastolic parameters are consistent with Grade I diastolic dysfunction (impaired relaxation). Right Ventricle: The right ventricular size is normal. No increase in right ventricular wall thickness. Right ventricular systolic function is normal. There is normal pulmonary artery systolic pressure. The tricuspid regurgitant velocity is 2.72 m/s, and  with an assumed right atrial pressure of 5 mmHg, the estimated right ventricular systolic pressure is 34.6 mmHg. Left Atrium: Left atrial size was normal  in size. Right Atrium: Right atrial size was normal in size. Pericardium: There is no evidence of pericardial effusion. Mitral Valve: The mitral valve is normal in structure. Mild mitral valve regurgitation. No evidence of mitral valve stenosis. Tricuspid Valve: The tricuspid valve is normal in structure. Tricuspid valve regurgitation is mild . No evidence of tricuspid stenosis. Aortic Valve: The aortic valve is tricuspid. Aortic valve regurgitation is not visualized. No aortic stenosis is present. Aortic valve mean gradient measures 2.0 mmHg. Aortic valve peak gradient measures 3.7 mmHg. Aortic valve area, by VTI measures 2.45 cm. Pulmonic Valve: The pulmonic valve was normal in structure. Pulmonic valve regurgitation is not visualized. No evidence of pulmonic stenosis. Aorta: The aortic root is normal in size and structure. Venous: The inferior vena cava is normal in size with greater than 50% respiratory variability, suggesting right atrial pressure of 3 mmHg. IAS/Shunts: No atrial level shunt detected by color flow Doppler.  LEFT VENTRICLE PLAX 2D                        Biplane EF (MOD) LVIDd:         4.00 cm         LV Biplane EF:   Left LV PW:         1.20 cm                          ventricular LV IVS:        1.10 cm                          ejection LVOT diam:     2.00 cm                          fraction by LV SV:         45                               2D MOD LV SV Index:   25                               biplane is LVOT Area:     3.14 cm                         45.3 %.  Diastology LV Volumes (MOD)               LV e' medial:    7.62 cm/s LV vol d, MOD    66.3 ml       LV E/e' medial:  5.9 A2C:                           LV e' lateral:   9.68 cm/s LV vol d, MOD    84.3 ml       LV E/e' lateral: 4.7 A4C: LV vol s, MOD    38.6 ml A2C:                           3D Volume EF LV vol s, MOD    45.7 ml       LV 3D EF:    Left A4C:                                        ventricul  LV SV MOD A2C:   27.7 ml                    ar LV SV MOD A4C:   84.3 ml                    ejection LV SV MOD BP:    34.2 ml                    fraction                                             by 3D                                             volume is                                             49 %.                                 3D Volume EF:                                3D EF:        49 %                                LV EDV:       81 ml                                LV ESV:       42 ml  LV SV:        40 ml RIGHT VENTRICLE RV Basal diam:  3.20 cm RV S prime:     10.60 cm/s TAPSE (M-mode): 1.9 cm LEFT ATRIUM             Index        RIGHT ATRIUM           Index LA Vol (A2C):   33.3 ml 18.30 ml/m  RA Area:     22.40 cm LA Vol (A4C):   30.0 ml 16.49 ml/m  RA Volume:   71.10 ml  39.08 ml/m LA Biplane Vol: 33.3 ml 18.30 ml/m  AORTIC VALVE                    PULMONIC VALVE AV Area (Vmax):    2.41 cm     PV Vmax:       0.52 m/s AV Area (Vmean):   2.36 cm     PV Peak grad:  1.1 mmHg AV Area (VTI):     2.45 cm AV Vmax:           95.60 cm/s AV Vmean:          62.900 cm/s AV VTI:            0.185 m AV Peak Grad:      3.7 mmHg AV Mean Grad:      2.0 mmHg LVOT Vmax:         73.20 cm/s LVOT Vmean:        47.300 cm/s LVOT VTI:          0.144 m LVOT/AV VTI ratio: 0.78  AORTA Ao Root diam: 3.20 cm Ao Asc diam:  3.60 cm MITRAL VALVE               TRICUSPID VALVE MV Area (PHT): 3.48 cm    TR Peak grad:   29.6 mmHg MV Decel Time: 218 msec    TR Vmax:        272.00 cm/s MV E velocity: 45.30 cm/s MV A velocity: 54.50 cm/s  SHUNTS MV E/A ratio:  0.83        Systemic VTI:  0.14 m                            Systemic Diam: 2.00 cm Julien Nordmann MD Electronically signed by Julien Nordmann MD Signature Date/Time: 07/25/2022/8:24:09 PM    Final

## 2022-09-02 NOTE — Assessment & Plan Note (Addendum)
#   Lung nodules.  Imaging was reviewed and discussed with patient.  05/23/22 CT scan showed New 8 x 5 mm RIGHT upper lobe pulmonary nodule, new right middle lobe nodules, wax and wane other nodules, chronic infection/inflammation vs malignancy 08/29/22 CT showed stable nodules, probably benign.  Continue annual CT chest wo contrast

## 2022-09-02 NOTE — Assessment & Plan Note (Addendum)
#   Stage III supraglottic larynx cancer Clinically he is doing well.  He is 9 year post completion of treatment. Monitor thyroid function annually.

## 2022-09-02 NOTE — Assessment & Plan Note (Signed)
TSH and T4 were stable, continue synthroid 75mcg daily- managed by Dr. Miller 

## 2022-11-13 DIAGNOSIS — R972 Elevated prostate specific antigen [PSA]: Secondary | ICD-10-CM | POA: Diagnosis not present

## 2023-01-01 ENCOUNTER — Emergency Department: Payer: Medicare HMO

## 2023-01-01 ENCOUNTER — Encounter: Payer: Self-pay | Admitting: Emergency Medicine

## 2023-01-01 DIAGNOSIS — R918 Other nonspecific abnormal finding of lung field: Secondary | ICD-10-CM | POA: Diagnosis not present

## 2023-01-01 DIAGNOSIS — R0902 Hypoxemia: Secondary | ICD-10-CM | POA: Diagnosis not present

## 2023-01-01 DIAGNOSIS — J441 Chronic obstructive pulmonary disease with (acute) exacerbation: Secondary | ICD-10-CM | POA: Insufficient documentation

## 2023-01-01 DIAGNOSIS — I2510 Atherosclerotic heart disease of native coronary artery without angina pectoris: Secondary | ICD-10-CM | POA: Insufficient documentation

## 2023-01-01 DIAGNOSIS — I10 Essential (primary) hypertension: Secondary | ICD-10-CM | POA: Diagnosis not present

## 2023-01-01 DIAGNOSIS — R000 Tachycardia, unspecified: Secondary | ICD-10-CM | POA: Diagnosis not present

## 2023-01-01 DIAGNOSIS — R0600 Dyspnea, unspecified: Secondary | ICD-10-CM | POA: Diagnosis not present

## 2023-01-01 DIAGNOSIS — J432 Centrilobular emphysema: Secondary | ICD-10-CM | POA: Diagnosis not present

## 2023-01-01 DIAGNOSIS — C329 Malignant neoplasm of larynx, unspecified: Secondary | ICD-10-CM | POA: Diagnosis not present

## 2023-01-01 DIAGNOSIS — Z7951 Long term (current) use of inhaled steroids: Secondary | ICD-10-CM | POA: Diagnosis not present

## 2023-01-01 DIAGNOSIS — R0602 Shortness of breath: Secondary | ICD-10-CM | POA: Diagnosis not present

## 2023-01-01 DIAGNOSIS — J439 Emphysema, unspecified: Secondary | ICD-10-CM | POA: Diagnosis not present

## 2023-01-01 LAB — CBC
HCT: 47.9 % (ref 39.0–52.0)
Hemoglobin: 15.8 g/dL (ref 13.0–17.0)
MCH: 29 pg (ref 26.0–34.0)
MCHC: 33 g/dL (ref 30.0–36.0)
MCV: 87.9 fL (ref 80.0–100.0)
Platelets: 201 10*3/uL (ref 150–400)
RBC: 5.45 MIL/uL (ref 4.22–5.81)
RDW: 13.2 % (ref 11.5–15.5)
WBC: 8.2 10*3/uL (ref 4.0–10.5)
nRBC: 0 % (ref 0.0–0.2)

## 2023-01-01 NOTE — ED Triage Notes (Signed)
Pt presents to triage via [facility] with complaints of SOB x 1 hour. Hx of Emphysema, COPD, and Larynx cancer. No O2 requirements but was provided 2L [place] for comfort by EMS. A&Ox4 at this time. Denies CP, fevers, chills, cough, nor N/V/D.

## 2023-01-02 ENCOUNTER — Emergency Department
Admission: EM | Admit: 2023-01-02 | Discharge: 2023-01-02 | Disposition: A | Discharge status: Home / Self Care | Payer: Medicare HMO | Attending: Emergency Medicine | Admitting: Emergency Medicine

## 2023-01-02 ENCOUNTER — Emergency Department: Payer: Medicare HMO

## 2023-01-02 DIAGNOSIS — I2510 Atherosclerotic heart disease of native coronary artery without angina pectoris: Secondary | ICD-10-CM | POA: Diagnosis not present

## 2023-01-02 DIAGNOSIS — C329 Malignant neoplasm of larynx, unspecified: Secondary | ICD-10-CM | POA: Diagnosis not present

## 2023-01-02 DIAGNOSIS — J432 Centrilobular emphysema: Secondary | ICD-10-CM | POA: Diagnosis not present

## 2023-01-02 DIAGNOSIS — J441 Chronic obstructive pulmonary disease with (acute) exacerbation: Secondary | ICD-10-CM

## 2023-01-02 DIAGNOSIS — R918 Other nonspecific abnormal finding of lung field: Secondary | ICD-10-CM | POA: Diagnosis not present

## 2023-01-02 LAB — BASIC METABOLIC PANEL
Anion gap: 9 (ref 5–15)
BUN: 16 mg/dL (ref 8–23)
CO2: 26 mmol/L (ref 22–32)
Calcium: 8.9 mg/dL (ref 8.9–10.3)
Chloride: 102 mmol/L (ref 98–111)
Creatinine, Ser: 0.86 mg/dL (ref 0.61–1.24)
GFR, Estimated: >60 mL/min (ref >60)
Glucose, Bld: 110 mg/dL — ABNORMAL HIGH (ref 70–99)
Potassium: 3.8 mmol/L (ref 3.5–5.1)
Sodium: 137 mmol/L (ref 135–145)

## 2023-01-02 LAB — TROPONIN I (HIGH SENSITIVITY)
Troponin I (High Sensitivity): 10 ng/L (ref <18)
Troponin I (High Sensitivity): 9 ng/L (ref <18)

## 2023-01-02 MED ORDER — METHYLPREDNISOLONE SODIUM SUCC 125 MG IJ SOLR
125.0000 mg | Freq: Once | INTRAMUSCULAR | Status: AC
  Administered 2023-01-02: 125 mg via INTRAVENOUS
  Filled 2023-01-02: qty 2

## 2023-01-02 MED ORDER — IOHEXOL 350 MG/ML SOLN
100.0000 mL | Freq: Once | INTRAVENOUS | Status: AC | PRN
  Administered 2023-01-02: 100 mL via INTRAVENOUS

## 2023-01-02 MED ORDER — PREDNISONE 20 MG PO TABS: 60.0000 mg | ORAL_TABLET | Freq: Every day | ORAL | 0 refills | Status: AC

## 2023-01-02 MED ORDER — IPRATROPIUM-ALBUTEROL 0.5-2.5 (3) MG/3ML IN SOLN
3.0000 mL | Freq: Once | RESPIRATORY_TRACT | Status: AC
  Administered 2023-01-02: 3 mL via RESPIRATORY_TRACT
  Filled 2023-01-02: qty 3

## 2023-01-02 NOTE — ED Provider Notes (Signed)
[facility] Provider Note    Event Date/Time   First MD Initiated Contact with Patient 01/02/23 [phone]     (approximate)   History   Shortness of Breath   HPI Benjamin Torres is a 67 y.o. 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  He has a history of pulmonary nodules as well which are being followed.  He has a history of coronary artery disease.  He arrived by EMS tonight for evaluation of shortness of breath.  He said that the shortness of breath started rather acutely about an hour prior to coming to the ED.  He has not felt particularly ill recently with no fever, chest pain, chills, cough, nausea, vomiting, abdominal pain, nor diarrhea.  He said that he has been using his inhalers as prescribed.  He does not have a nebulizer.  He does not use supplemental oxygen.  He was not hypoxic for EMS but they put him on oxygen 2 L for comfort.  He reportedly has a history of a blood clot in his lungs which was diagnosed more than 10 years ago.     Physical Exam   Triage Vital Signs: ED Triage Vitals  Encounter Vitals Group     BP 01/01/23 2332 (!) 144/92     Systolic BP Percentile --      Diastolic BP Percentile --      Pulse Rate 01/01/23 2332 73     Resp 01/01/23 2332 (!) 22     Temp 01/01/23 2332 97.7 F (36.5 C)     Temp Source 01/01/23 2332 Oral     SpO2 01/01/23 2332 96 %     Weight 01/01/23 2341 72.3 kg (159 lb 6.3 oz)     Height 01/01/23 2341 1.689 m (5' 6.5")     Head Circumference --      Peak Flow --      Pain Score 01/01/23 2337 0     Pain Loc --      Pain Education --      Exclude from Growth Chart --     Most recent vital signs: Vitals:   01/01/23 2332 01/02/23 0326  BP: (!) 144/92 (!) 131/95  Pulse: 73 62  Resp: (!) 22 20  Temp: 97.7 F (36.5 C)   SpO2: 96% 99%    General: Awake, alert,  communicative. CV:  Good peripheral perfusion.  Regular rate and rhythm, normal heart sounds. Resp:  Normal effort. Speaking easily and comfortably, no accessory muscle usage nor intercostal retractions.  Patient has mild expiratory wheezing throughout.  Good air movement.  Patient is currently on 2 L of oxygen by nasal cannula for comfort. Abd:  No distention.  No tenderness to palpation of the abdomen.   ED Results / Procedures / Treatments   Labs (all labs ordered are listed, but only abnormal results are displayed) Labs Reviewed  BASIC METABOLIC PANEL - Abnormal; Notable for the following components:      Result Value   Glucose, Bld 110 (*)    All other components within normal limits  CBC  TROPONIN I (HIGH SENSITIVITY)  TROPONIN I (HIGH SENSITIVITY)     EKG  ED ECG REPORT I, Loleta Rose, the attending physician, personally viewed and interpreted this ECG.  Date: 01/01/2023 EKG Time: 23: 36 Rate: 77 Rhythm: normal sinus rhythm QRS Axis:  normal Intervals: normal ST/T Wave abnormalities: normal Narrative Interpretation: no evidence of acute ischemia    RADIOLOGY I viewed and interpreted the patient's two-view chest x-ray.  I see no evidence of pulmonary edema nor of pneumonia.  I also read the radiologist's report, which confirmed no acute findings, just chronic emphysema.  I also viewed and interpreted the patient's CTA chest.  No evidence of PE or interstitial pneumonia.  Radiologist agreed no acute findings, commented on increasing pulmonary nodules, possibly some inflammatory or infectious process associated with the nodules but no clear evidence of acute or emergent abnormality.  PROCEDURES:  Critical Care performed: No  Procedures    IMPRESSION / MDM / ASSESSMENT AND PLAN / ED COURSE  I reviewed the triage vital signs and the nursing notes.                              Differential diagnosis includes, but is not limited to, COPD exacerbation, PE,  pneumonia.  Patient's presentation is most consistent with acute presentation with potential threat to life or bodily function.  Labs/studies ordered: 2 view chest x-ray, CTA chest, CBC, BMP, high-sensitivity troponin x 2  Interventions/Medications given:  Medications  ipratropium-albuterol (DUONEB) 0.5-2.5 (3) MG/3ML nebulizer solution 3 mL (3 mLs Nebulization Given 01/02/23 0325)  methylPREDNISolone sodium succinate (SOLU-MEDROL) 125 mg/2 mL injection 125 mg (125 mg Intravenous Given 01/02/23 0325)  iohexol (OMNIPAQUE) 350 MG/ML injection 100 mL (100 mLs Intravenous Contrast Given 01/02/23 0347)    (Note:  hospital course my include additional interventions and/or labs/studies not listed above.)   Vital signs have been stable and at no point was the patient hypoxic.  Lab work is within normal limits including 2 high-sensitivity troponins.  No evidence of ischemia on EKG.  Chest x-ray was generally reassuring but I was concerned about the possibility of a pulmonary embolism given the acute onset of his symptoms (even though the symptoms seem more consistent with COPD).  His CTA was reassuring as documented above.  The patient felt better after a DuoNeb and Solu-Medrol 125 mg in the ED.  He and his family are comfortable, given the reassuring workup, with the plan for discharge and treatment for COPD exacerbation with close follow-up with his pulmonologist.  Prescription as listed below and he already has albuterol inhaler at home.  I gave my usual and customary follow-up recommendations and return precautions.  The patient was on the cardiac monitor to evaluate for evidence of arrhythmia and/or significant heart rate changes.       FINAL CLINICAL IMPRESSION(S) / ED DIAGNOSES   Final diagnoses:  COPD exacerbation (HCC)     Rx / DC Orders   ED Discharge Orders          Ordered    predniSONE (DELTASONE) 20 MG tablet  Daily with breakfast        01/02/23 0442             Note:   This document was prepared using Dragon voice recognition software and may include unintentional dictation errors.   Loleta Rose, MD 01/02/23 [phone]

## 2023-01-02 NOTE — Discharge Instructions (Signed)
We believe that your symptoms are caused today by an exacerbation of your COPD.  Please take the prescribed medications and any medications that you have at home for your COPD.  Follow up with your doctor as recommended.  If you develop any new or worsening symptoms, including but not limited to fever, persistent vomiting, worsening shortness of breath, or other symptoms that concern you, please return to the Emergency Department immediately.  

## 2023-01-03 DIAGNOSIS — J441 Chronic obstructive pulmonary disease with (acute) exacerbation: Secondary | ICD-10-CM | POA: Diagnosis not present

## 2023-01-03 DIAGNOSIS — R918 Other nonspecific abnormal finding of lung field: Secondary | ICD-10-CM | POA: Diagnosis not present

## 2023-01-06 DIAGNOSIS — J441 Chronic obstructive pulmonary disease with (acute) exacerbation: Secondary | ICD-10-CM | POA: Diagnosis not present

## 2023-01-06 NOTE — Group Note (Deleted)

## 2023-01-16 ENCOUNTER — Ambulatory Visit: Payer: Medicare HMO | Attending: Student in an Organized Health Care Education/Training Program

## 2023-01-16 DIAGNOSIS — J984 Other disorders of lung: Secondary | ICD-10-CM | POA: Diagnosis not present

## 2023-01-16 DIAGNOSIS — J432 Centrilobular emphysema: Secondary | ICD-10-CM | POA: Diagnosis not present

## 2023-01-16 LAB — PULMONARY FUNCTION TEST ARMC ONLY
DL/VA % pred: 45 %
DL/VA: 1.88 ml/min/mmHg/L
DLCO unc % pred: 35 %
DLCO unc: 8.12 ml/min/mmHg
FEF 25-75 Post: 0.37 L/sec
FEF 25-75 Pre: 0.31 L/sec
FEF2575-%Change-Post: 20 %
FEF2575-%Pred-Post: 16 %
FEF2575-%Pred-Pre: 14 %
FEV1-%Change-Post: -6 %
FEV1-%Pred-Post: 27 %
FEV1-%Pred-Pre: 29 %
FEV1-Post: 0.79 L
FEV1-Pre: 0.84 L
FEV1FVC-%Change-Post: -15 %
FEV1FVC-%Pred-Pre: 54 %
FEV6-%Change-Post: 12 %
FEV6-%Pred-Post: 58 %
FEV6-%Pred-Pre: 52 %
FEV6-Post: 2.13 L
FEV6-Pre: 1.89 L
FEV6FVC-%Change-Post: 1 %
FEV6FVC-%Pred-Post: 98 %
FEV6FVC-%Pred-Pre: 96 %
FVC-%Change-Post: 11 %
FVC-%Pred-Post: 59 %
FVC-%Pred-Pre: 54 %
FVC-Post: 2.31 L
FVC-Pre: 2.08 L
Post FEV1/FVC ratio: 34 %
Post FEV6/FVC ratio: 92 %
Pre FEV1/FVC ratio: 41 %
Pre FEV6/FVC Ratio: 91 %
RV % pred: 122 %
RV: 2.68 L
TLC % pred: 94 %
TLC: 5.89 L

## 2023-01-28 ENCOUNTER — Encounter: Payer: Self-pay | Admitting: Student in an Organized Health Care Education/Training Program

## 2023-01-28 ENCOUNTER — Ambulatory Visit: Payer: Medicare HMO | Admitting: Student in an Organized Health Care Education/Training Program

## 2023-01-28 VITALS — BP 100/80 | HR 82 | Temp 97.6°F | Ht 65.5 in | Wt 153.4 lb

## 2023-01-28 DIAGNOSIS — F17210 Nicotine dependence, cigarettes, uncomplicated: Secondary | ICD-10-CM | POA: Diagnosis not present

## 2023-01-28 DIAGNOSIS — R911 Solitary pulmonary nodule: Secondary | ICD-10-CM

## 2023-01-28 DIAGNOSIS — J432 Centrilobular emphysema: Secondary | ICD-10-CM

## 2023-01-28 DIAGNOSIS — Z720 Tobacco use: Secondary | ICD-10-CM

## 2023-01-28 MED ORDER — AZITHROMYCIN 250 MG PO TABS
ORAL_TABLET | ORAL | 0 refills | Status: AC
Start: 2023-01-28 — End: 2023-02-02

## 2023-01-28 MED ORDER — PREDNISONE 20 MG PO TABS: 20.0000 mg | ORAL_TABLET | Freq: Every day | ORAL | 0 refills | Status: AC

## 2023-01-28 MED ORDER — BREZTRI AEROSPHERE 160-9-4.8 MCG/ACT IN AERO: 2.0000 | INHALATION_SPRAY | Freq: Two times a day (BID) | RESPIRATORY_TRACT | Status: DC

## 2023-01-28 NOTE — Progress Notes (Signed)
Assessment & Plan:   #Pulmonary Nodules   Does have waxing and waning pulmonary nodules on imaging, with the most recent chest CT showing stable nodules, with some nodules disappearing and others being new. The appearance and findings is overall consistent with an infectious/inflammatory cause behind his pulmonary nodules. I will hold off on ordering further chest CT's given the benign nature of the nodules and the disappearance of the previous nodules in question. Will refer him to our lung cancer screening program.  -referral to lung cancer screening program.  #COPD GOLD 4E  Presents for follow up of COPD, noted on PFT's from 01/16/2023 with FEV1 of 27% predicted. He's had an exacerbation requiring hospitalization and recently had an exacerbation that required steroids and antibiotics. He again has wheezing and increased symptoms today. I will prescribe him a short course of antibiotics and steroids today, and re-assess symptoms on follow up. Should he continue to have exacerbations, I'll consider long term antibiotics vs daliresp. I have counseled him extensively about the importance of smoking cessation in symptom control. He's tried pulmonary rehab and felt that walking there was very cumbersome. Discussed with patient and his wife today, they will re-attempt pulmonary rehab for symptomatic management. Finally, will trial [facility] and see if it has an impact on symptoms. If he feels better with it, will switch from Trelegy.  - azithromycin (ZITHROMAX) 250 MG tablet; Take 2 tablets (500 mg) on  Day 1,  followed by 1 tablet (250 mg) once daily on Days 2 through 5.  Dispense: 6 each; Refill: 0 - predniSONE (DELTASONE) 20 MG tablet; Take 1 tablet (20 mg total) by mouth daily with breakfast for 5 days.  Dispense: 5 tablet; Refill: 0 - Trial Breztri two puffs twice daily - Continue pulmonary rehabilitation  #Tobacco Use  Patient unfortunately continues to smoke. Counseled him again regarding  the importance of smoking cessation. He has his nicotine replacement products but hasn't used them yet. He will attempt to quit.   Return in about 6 months (around 07/29/2023).  I spent 30 minutes caring for this patient today, including preparing to see the patient, obtaining a medical history , reviewing a separately obtained history, performing a medically appropriate examination and/or evaluation, counseling and educating the patient/family/caregiver, ordering medications, tests, or procedures, documenting clinical information in the electronic health record, and independently interpreting results (not separately reported/billed) and communicating results to the patient/family/caregiver. 4 minutes spent on smoking cessation counseling. This visit is part of longitudinal care for the patient's chronic conditions.  Raechel Chute, MD [facility] 01/28/2023 2:52 PM    End of visit medications:  Meds ordered this encounter  Medications   azithromycin (ZITHROMAX) 250 MG tablet    Sig: Take 2 tablets (500 mg) on  Day 1,  followed by 1 tablet (250 mg) once daily on Days 2 through 5.    Dispense:  6 each    Refill:  0   predniSONE (DELTASONE) 20 MG tablet    Sig: Take 1 tablet (20 mg total) by mouth daily with breakfast for 5 days.    Dispense:  5 tablet    Refill:  0     Current Outpatient Medications:    acetaminophen-codeine (TYLENOL #3) 300-30 MG tablet, Take 1 tablet by mouth as needed., Disp: , Rfl:    albuterol (PROVENTIL HFA;VENTOLIN HFA) 108 (90 Base) MCG/ACT inhaler, Inhale 1 puff into the lungs every 4 (four) hours as needed for wheezing or shortness of breath.,  Disp: , Rfl:    ALPRAZolam (XANAX) 0.25 MG tablet, Take 0.25 mg by mouth 3 (three) times daily as needed for anxiety., Disp: , Rfl:    azithromycin (ZITHROMAX) 250 MG tablet, Take 2 tablets (500 mg) on  Day 1,  followed by 1 tablet (250 mg) once daily on Days 2 through 5., Disp: 6 each, Rfl: 0    cyanocobalamin (,VITAMIN B-12,) 1000 MCG/ML injection, Inject 1,000 mcg into the muscle every 30 (thirty) days., Disp: , Rfl:    gabapentin (NEURONTIN) 100 MG capsule, Take 100 mg by mouth daily., Disp: , Rfl:    ibuprofen (ADVIL) 200 MG tablet, Take 400 mg by mouth as needed for headache., Disp: , Rfl:    levothyroxine (SYNTHROID) 75 MCG tablet, Take by mouth., Disp: , Rfl:    metoprolol succinate (TOPROL-XL) 25 MG 24 hr tablet, Take 25 mg by mouth daily., Disp: , Rfl:    montelukast (SINGULAIR) 10 MG tablet, Take 10 mg by mouth daily., Disp: , Rfl:    predniSONE (DELTASONE) 20 MG tablet, Take 1 tablet (20 mg total) by mouth daily with breakfast for 5 days., Disp: 5 tablet, Rfl: 0   rosuvastatin (CRESTOR) 10 MG tablet, Take 10 mg by mouth at bedtime., Disp: , Rfl:    tamsulosin (FLOMAX) 0.4 MG CAPS capsule, Take 1 capsule (0.4 mg total) by mouth daily., Disp: 90 capsule, Rfl: 3   TRELEGY ELLIPTA 200-62.5-25 MCG/INH AEPB, Inhale 1 puff into the lungs daily at 2 PM. , Disp: , Rfl:  No current facility-administered medications for this visit.  Facility-Administered Medications Ordered in Other Visits:    sodium chloride flush (NS) 0.9 % injection 3 mL, 3 mL, Intravenous, Q12H, Fath, Darlin Priestly, MD   Subjective:   PATIENT ID: Benjamin Torres GENDER: male DOB: May 06, 1955, MRN: [id number]  Chief Complaint  Patient presents with   Follow-up    Cough, shortness of breath and wheezing.     HPI  Benjamin Torres is a pleasant 67 year old male presenting to clinic for follow up of shortness of breath as well as pulmonary nodules.  Patient continues to feel short of breath with exertion, and to report a cough. His wife also reports a wheeze and feels that Benjamin Torres breathing has worsened over the past few days. He was seen earlier in the month for COPD exacerbation and subsequently prescribed prednisone and antibiotics by his primary care physician. He's been on Trelegy and feels that while it is  working for him, symptoms persist. He unfortunately continues to smoke.    Benjamin Torres is also followed for lung cancer screening and has underwent multiple chest CT's to that effect. He has had multiple waxing and waning pulmonary nodules for which he is being followed. His most recent chest CT's were in May of 2024 and then in September of 2024. He is also followed by Dr. Cathie Hoops from oncology for a history of supraglottic laryngeal cancer 9 years ago and gets regular surveillance imaging.   Benjamin Torres is a smoker and continues to smoke. He has around a 60 pack year history of smoking. He has a dog but no other pets (no cats or birds). He used to work in a [employer].  Ancillary information including prior medications, full medical/surgical/family/social histories, and PFTs (when available) are listed below and have been reviewed.   Review of Systems  Constitutional:  Negative for chills, fever, malaise/fatigue and weight loss.  Respiratory:  Positive for cough, sputum production and shortness of  breath. Negative for hemoptysis and wheezing.   Cardiovascular:  Negative for chest pain, leg swelling and PND.  Skin:  Negative for rash.     Objective:   Vitals:   01/28/23 1436  BP: 100/80  Pulse: 82  Temp: 97.6 F (36.4 C)  TempSrc: Temporal  SpO2: 96%  Weight: 153 lb 6.4 oz (69.6 kg)  Height: 5' 5.5" (1.664 m)   96% on RA BMI Readings from Last 3 Encounters:  01/28/23 25.14 kg/m  01/01/23 25.34 kg/m  09/02/22 24.87 kg/m   Wt Readings from Last 3 Encounters:  01/28/23 153 lb 6.4 oz (69.6 kg)  01/01/23 159 lb 6.3 oz (72.3 kg)  09/02/22 156 lb 6.4 oz (70.9 kg)    Physical Exam Constitutional:      General: He is not in acute distress.    Appearance: Normal appearance. He is not ill-appearing.  HENT:     Head: Normocephalic.     Mouth/Throat:     Mouth: Mucous membranes are moist.  Cardiovascular:     Rate and Rhythm: Normal rate and regular rhythm.     Pulses: Normal  pulses.     Heart sounds: Normal heart sounds.  Pulmonary:     Effort: Pulmonary effort is normal.     Breath sounds: Wheezing (end expiratory) present. No rhonchi or rales.  Abdominal:     Palpations: Abdomen is soft.  Musculoskeletal:     Right lower leg: No edema.     Left lower leg: No edema.  Neurological:     General: No focal deficit present.     Mental Status: He is alert and oriented to person, place, and time. Mental status is at baseline.       Ancillary Information    Past Medical History:  Diagnosis Date   Cancer of larynx (HCC) 11/06/2014   COPD (chronic obstructive pulmonary disease) (HCC)    Hypothyroidism    PE (pulmonary thromboembolism) (HCC)    right lung, over 10 yrs ago     No family history on file.   Past Surgical History:  Procedure Laterality Date   ANAL FISSURE REPAIR     APPENDECTOMY     BIOPSY PHARYNX  2015   Larynx BX   COLONOSCOPY WITH PROPOFOL N/A 03/07/2017   Procedure: COLONOSCOPY WITH PROPOFOL;  Surgeon: Midge Minium, MD;  Location: [facility];  Service: Endoscopy;  Laterality: N/A;   ESOPHAGOGASTRODUODENOSCOPY (EGD) WITH PROPOFOL N/A 03/07/2017   Procedure: ESOPHAGOGASTRODUODENOSCOPY (EGD) WITH PROPOFOL;  Surgeon: Midge Minium, MD;  Location: [facility];  Service: Endoscopy;  Laterality: N/A;   LEFT HEART CATH AND CORONARY ANGIOGRAPHY Left 09/23/2019   Procedure: LEFT HEART CATH AND CORONARY ANGIOGRAPHY;  Surgeon: Dalia Heading, MD;  Location: [facility];  Service: Cardiovascular;  Laterality: Left;   POLYPECTOMY  03/07/2017   Procedure: POLYPECTOMY;  Surgeon: Midge Minium, MD;  Location: [facility];  Service: Endoscopy;;    Social History   Socioeconomic History   Marital status: Married    Spouse name: Not on file   Number of children: Not on file   Years of education: Not on file   Highest education level: Not on file  Occupational History   Not on file  Tobacco Use   Smoking status:  Every Day    Current packs/day: 0.00    Average packs/day: 1.5 packs/day for 40.0 years (60.0 ttl pk-yrs)    Types: E-cigarettes, Cigarettes    Start date: 71  Last attempt to quit: 2015    Years since quitting: 9.7   Smokeless tobacco: Never   Tobacco comments:    Smokes 12-15 cigarettes daily. 06/13/2022.   Vaping Use   Vaping status: Former  Substance and Sexual Activity   Alcohol use: No    Alcohol/week: 0.0 standard drinks of alcohol   Drug use: Not on file   Sexual activity: Not on file  Other Topics Concern   Not on file  Social History Narrative   Not on file   Social Determinants of Health   Financial Resource Strain: Low Risk  (01/06/2023)   Received from [facility]   Overall Financial Resource Strain (CARDIA)    Difficulty of Paying Living Expenses: Not hard at all  Food Insecurity: No Food Insecurity (01/06/2023)   Received from [facility]   Hunger Vital Sign    Worried About Running Out of Food in the Last Year: Never true    Ran Out of Food in the Last Year: Never true  Transportation Needs: No Transportation Needs (01/06/2023)   Received from [facility] - Transportation    In the past 12 months, has lack of transportation kept you from medical appointments or from getting medications?: No    Lack of Transportation (Non-Medical): No  Physical Activity: Not on file  Stress: Not on file  Social Connections: Not on file  Intimate Partner Violence: Not on file     No Known Allergies   CBC    Component Value Date/Time   WBC 8.2 01/01/2023 2340   RBC 5.45 01/01/2023 2340   HGB 15.8 01/01/2023 2340   HGB 16.1 05/21/2014 1941   HCT 47.9 01/01/2023 2340   HCT 48.3 05/21/2014 1941   PLT 201 01/01/2023 2340   PLT 192 05/21/2014 1941   MCV 87.9 01/01/2023 2340   MCV 91 05/21/2014 1941   MCH 29.0 01/01/2023 2340   MCHC 33.0 01/01/2023 2340   RDW 13.2 01/01/2023 2340   RDW 15.0 (H) 05/21/2014  1941   LYMPHSABS 1.6 11/01/2021 1353   LYMPHSABS 1.0 04/07/2014 1057   MONOABS 0.7 11/01/2021 1353   MONOABS 0.5 04/07/2014 1057   EOSABS 0.2 11/01/2021 1353   EOSABS 0.1 04/07/2014 1057   BASOSABS 0.0 11/01/2021 1353   BASOSABS 0.0 04/07/2014 1057    Pulmonary Functions Testing Results:    Latest Ref Rng & Units 01/16/2023    2:23 PM  PFT Results  FVC-Pre L 2.08   FVC-Predicted Pre % 54   FVC-Post L 2.31   FVC-Predicted Post % 59   Pre FEV1/FVC % % 41   Post FEV1/FCV % % 34   FEV1-Pre L 0.84   FEV1-Predicted Pre % 29   FEV1-Post L 0.79   DLCO uncorrected ml/min/mmHg 8.12   DLCO UNC% % 35   DLVA Predicted % 45   TLC L 5.89   TLC % Predicted % 94   RV % Predicted % 122     Outpatient Medications Prior to Visit  Medication Sig Dispense Refill   acetaminophen-codeine (TYLENOL #3) 300-30 MG tablet Take 1 tablet by mouth as needed.     albuterol (PROVENTIL HFA;VENTOLIN HFA) 108 (90 Base) MCG/ACT inhaler Inhale 1 puff into the lungs every 4 (four) hours as needed for wheezing or shortness of breath.     ALPRAZolam (XANAX) 0.25 MG tablet Take 0.25 mg by mouth 3 (three) times daily as needed for anxiety.  cyanocobalamin (,VITAMIN B-12,) 1000 MCG/ML injection Inject 1,000 mcg into the muscle every 30 (thirty) days.     gabapentin (NEURONTIN) 100 MG capsule Take 100 mg by mouth daily.     ibuprofen (ADVIL) 200 MG tablet Take 400 mg by mouth as needed for headache.     levothyroxine (SYNTHROID) 75 MCG tablet Take by mouth.     metoprolol succinate (TOPROL-XL) 25 MG 24 hr tablet Take 25 mg by mouth daily.     montelukast (SINGULAIR) 10 MG tablet Take 10 mg by mouth daily.     rosuvastatin (CRESTOR) 10 MG tablet Take 10 mg by mouth at bedtime.     tamsulosin (FLOMAX) 0.4 MG CAPS capsule Take 1 capsule (0.4 mg total) by mouth daily. 90 capsule 3   TRELEGY ELLIPTA 200-62.5-25 MCG/INH AEPB Inhale 1 puff into the lungs daily at 2 PM.      Facility-Administered Medications Prior to  Visit  Medication Dose Route Frequency Provider Last Rate Last Admin   sodium chloride flush (NS) 0.9 % injection 3 mL  3 mL Intravenous Q12H Dalia Heading, MD

## 2023-02-03 ENCOUNTER — Telehealth: Payer: Self-pay | Admitting: Student in an Organized Health Care Education/Training Program

## 2023-02-03 MED ORDER — BREZTRI AEROSPHERE 160-9-4.8 MCG/ACT IN AERO: 2.0000 | INHALATION_SPRAY | Freq: Two times a day (BID) | RESPIRATORY_TRACT | Status: DC

## 2023-02-03 MED ORDER — BREZTRI AEROSPHERE 160-9-4.8 MCG/ACT IN AERO: 2.0000 | INHALATION_SPRAY | Freq: Two times a day (BID) | RESPIRATORY_TRACT | 3 refills | Status: AC

## 2023-02-03 NOTE — Telephone Encounter (Signed)
PT was given samples of Breztri and wife said he'd like a 90 day supply called into [facility]. He was last seen on 10/1,  Her # is [phone] id you have any questions.   Please call her because if we can not get this right away she may need another sample.

## 2023-02-03 NOTE — Telephone Encounter (Signed)
Spoke to patient's spouse, Dawn(DPR). She is requesting 90 day supply of breztri to be sent to [facility]. Rx sent. One sample of Breztir left up front for pickup. Nothing further needed.

## 2023-02-05 DIAGNOSIS — E538 Deficiency of other specified B group vitamins: Secondary | ICD-10-CM | POA: Diagnosis not present

## 2023-02-05 DIAGNOSIS — E782 Mixed hyperlipidemia: Secondary | ICD-10-CM | POA: Diagnosis not present

## 2023-02-09 NOTE — Progress Notes (Unsigned)
Cardiology Office Note  Date:  02/10/2023   ID:  Benjamin Torres, DOB March 25, 1956, MRN [id number]  PCP:  Danella Penton, MD   Chief Complaint  Patient presents with   6 month follow up     Patient c/o shortness of breath with little to no exertion. Medications reviewed by the patient verbally.     HPI:  [name] is a 67 year old gentleman with past medical history of pulmonary emphysema, smoker history of a PE,  Throat cancer catheterization on 09/23/2019 following an abnormal stress test and echocardiogram. Catheterization revealed a chronically occluded proximal RCA lesion with collaterals, a first marginal lesion that was chronically occluded, and a second diagonal lesion that was 75% stenosed recommendations were for medical management.  Echo 5/21: EF 40% Echo 06/2022 EF 45 to 50% Who presents for follow-up of his cardiomyopathy, coronary disease  Last seen by myself in clinic February 2024 Presents with his wife on today's visit "Just had PNA", followed by pulmonary, reports he does not qualify for oxygen Treated with prednisone and levaquin CT scan 01/02/23, nodules noted, followed by oncology  Continues to smoke, trying to transition to patches Has inhalers  Denies chest pain concerning for angina  Echocardiogram March 2024 EF 45 to 50%, mild LVH, mildly elevated right heart pressures  Notes indicating he stopped smoking when diagnosed with throat cancer but restarted smoking again several years   Cardiac catheterization results from May 2021   Lab work reviewed  total cholesterol 151 LDL 88 Normal BMP  EKG personally reviewed by myself on todays visit EKG Interpretation Date/Time:  Monday February 10 2023 15:35:11 EDT Ventricular Rate:  84 PR Interval:  160 QRS Duration:  92 QT Interval:  384 QTC Calculation: 453 R Axis:   69  Text Interpretation: Sinus rhythm with Premature atrial complexes When compared with ECG of 01-Jan-2023 23:36, Premature  atrial complexes are now Present Confirmed by Julien Nordmann [phone]) on 02/10/2023 3:47:38 PM     PMH:   has a past medical history of 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  PSH:    Past Surgical History:  Procedure Laterality Date   ANAL FISSURE REPAIR     APPENDECTOMY     BIOPSY PHARYNX  2015   Larynx BX   COLONOSCOPY WITH PROPOFOL N/A 03/07/2017   Procedure: COLONOSCOPY WITH PROPOFOL;  Surgeon: Midge Minium, MD;  Location: [facility];  Service: Endoscopy;  Laterality: N/A;   ESOPHAGOGASTRODUODENOSCOPY (EGD) WITH PROPOFOL N/A 03/07/2017   Procedure: ESOPHAGOGASTRODUODENOSCOPY (EGD) WITH PROPOFOL;  Surgeon: Midge Minium, MD;  Location: [facility];  Service: Endoscopy;  Laterality: N/A;   LEFT HEART CATH AND CORONARY ANGIOGRAPHY Left 09/23/2019   Procedure: LEFT HEART CATH AND CORONARY ANGIOGRAPHY;  Surgeon: Dalia Heading, MD;  Location: [facility];  Service: Cardiovascular;  Laterality: Left;   POLYPECTOMY  03/07/2017   Procedure: POLYPECTOMY;  Surgeon: Midge Minium, MD;  Location: [facility];  Service: Endoscopy;;    Current Outpatient Medications  Medication Sig Dispense Refill   acetaminophen-codeine (TYLENOL #3) 300-30 MG tablet Take 1 tablet by mouth as needed.     albuterol (PROVENTIL HFA;VENTOLIN HFA) 108 (90 Base) MCG/ACT inhaler Inhale 1 puff into the lungs every 4 (four) hours as needed for wheezing or shortness of breath.     ALPRAZolam (XANAX) 0.25 MG tablet Take 0.25 mg by mouth 3 (three) times daily as needed for anxiety.  Budeson-Glycopyrrol-Formoterol (BREZTRI AEROSPHERE) 160-9-4.8 MCG/ACT AERO Inhale 2 puffs into the lungs in the morning and at bedtime. 32.1 g 3   cyanocobalamin (,VITAMIN B-12,) 1000 MCG/ML injection Inject 1,000 mcg into the muscle every 30 (thirty) days.     gabapentin (NEURONTIN) 100 MG capsule Take 100  mg by mouth daily.     ibuprofen (ADVIL) 200 MG tablet Take 400 mg by mouth as needed for headache.     levothyroxine (SYNTHROID) 75 MCG tablet Take by mouth.     metoprolol succinate (TOPROL-XL) 25 MG 24 hr tablet Take 25 mg by mouth daily.     montelukast (SINGULAIR) 10 MG tablet Take 10 mg by mouth daily.     rosuvastatin (CRESTOR) 10 MG tablet Take 10 mg by mouth at bedtime.     tamsulosin (FLOMAX) 0.4 MG CAPS capsule Take 1 capsule (0.4 mg total) by mouth daily. 90 capsule 3   Budeson-Glycopyrrol-Formoterol (BREZTRI AEROSPHERE) 160-9-4.8 MCG/ACT AERO Inhale 2 puffs into the lungs in the morning and at bedtime. (Patient not taking: Reported on 02/10/2023)     No current facility-administered medications for this visit.   Facility-Administered Medications Ordered in Other Visits  Medication Dose Route Frequency Provider Last Rate Last Admin   sodium chloride flush (NS) 0.9 % injection 3 mL  3 mL Intravenous Q12H Dalia Heading, MD         Allergies:   Patient has no known allergies.   Social History:  The patient  reports that he has been smoking e-cigarettes and cigarettes. He started smoking about 49 years ago. He has a 60 pack-year smoking history. He has never used smokeless tobacco. He reports that he does not drink alcohol.   Family History:   family history is not on file.    Review of Systems: Review of Systems  Constitutional: Negative.   HENT: Negative.    Respiratory:  Positive for shortness of breath.   Cardiovascular: Negative.   Gastrointestinal: Negative.   Musculoskeletal: Negative.   Neurological: Negative.   Psychiatric/Behavioral: Negative.    All other systems reviewed and are negative.   PHYSICAL EXAM: VS:  BP 110/70 (BP Location: Left Arm, Patient Position: Sitting, Cuff Size: Normal)   Pulse 84   Ht 5' 6.5" (1.689 m)   Wt 154 lb 8 oz (70.1 kg)   SpO2 96%   BMI 24.56 kg/m  , BMI Body mass index is 24.56 kg/m. Constitutional:  oriented to person,  place, and time. No distress.  HENT:  Head: Grossly normal Eyes:  no discharge. No scleral icterus.  Neck: No JVD, no carotid bruits  Cardiovascular: Regular rate and rhythm, no murmurs appreciated Pulmonary/Chest: Clear to auscultation bilaterally, no wheezes or rails Abdominal: Soft.  no distension.  no tenderness.  Musculoskeletal: Normal range of motion Neurological:  normal muscle tone. Coordination normal. No atrophy Skin: Skin warm and dry Psychiatric: normal affect, pleasant  Recent Labs: 01/01/2023: BUN 16; Creatinine, Ser 0.86; Hemoglobin 15.8; Platelets 201; Potassium 3.8; Sodium 137    Lipid Panel No results found for: "CHOL", "HDL", "LDLCALC", "TRIG"    Wt Readings from Last 3 Encounters:  02/10/23 154 lb 8 oz (70.1 kg)  01/28/23 153 lb 6.4 oz (69.6 kg)  01/01/23 159 lb 6.3 oz (72.3 kg)     ASSESSMENT AND PLAN:  Problem List Items Addressed This Visit       Cardiology Problems   CAD (coronary artery disease) - Primary   Relevant Orders   EKG 12-Lead (Completed)  Hyperlipidemia     Other   Tobacco use (Chronic)   COPD (chronic obstructive pulmonary disease) (HCC)   Other Visit Diagnoses     Nonischemic cardiomyopathy (HCC)       Relevant Orders   EKG 12-Lead (Completed)       Coronary artery disease with stable angina Occluded RCA noted 2021 with collaterals from left to right, also occluded OM High risk of worsening coronary disease given continued smoking not a diabetic LDL above goal, changes as below Recommend he call us for anginal symptoms  Cardiomyopathy, ischemic [facility] notes reviewed, EF estimated 40% in May 2021 Repeat echocardiogram EF 45 to 50% in 2023 Plan as above  COPD/emphysema Long history of smoking, nodules noted in his lung CT Followed by pulmonary and oncology Prior history of throat cancer   Hyperlipidemia Recommend he continue Crestor 10 daily, add Zetia 10 daily to achieve goal LDL less than 55   Signed, Dossie Arbour, M.D., Ph.D. [facility] [place], [place] [phone]

## 2023-02-10 ENCOUNTER — Ambulatory Visit: Payer: Medicare HMO | Attending: Cardiovascular Disease | Admitting: Cardiovascular Disease

## 2023-02-10 ENCOUNTER — Encounter: Payer: Self-pay | Admitting: Cardiovascular Disease

## 2023-02-10 VITALS — BP 110/70 | HR 84 | Ht 66.5 in | Wt 154.5 lb

## 2023-02-10 DIAGNOSIS — J432 Centrilobular emphysema: Secondary | ICD-10-CM

## 2023-02-10 DIAGNOSIS — E782 Mixed hyperlipidemia: Secondary | ICD-10-CM

## 2023-02-10 DIAGNOSIS — I25118 Atherosclerotic heart disease of native coronary artery with other forms of angina pectoris: Secondary | ICD-10-CM | POA: Diagnosis not present

## 2023-02-10 DIAGNOSIS — I428 Other cardiomyopathies: Secondary | ICD-10-CM | POA: Diagnosis not present

## 2023-02-10 DIAGNOSIS — Z720 Tobacco use: Secondary | ICD-10-CM

## 2023-02-10 MED ORDER — EZETIMIBE 10 MG PO TABS: 10.0000 mg | ORAL_TABLET | Freq: Every day | ORAL | 3 refills | Status: AC

## 2023-02-10 NOTE — Patient Instructions (Signed)
Medication Instructions:  ?Please start zetia 10 mg daily  ? ?If you need a refill on your cardiac medications before your next appointment, please call your pharmacy.  ? ?Lab work: ?No new labs needed ? ?Testing/Procedures: ?No new testing needed ? ?Follow-Up: ?At [facility], you and your health needs are our priority.  As part of our continuing mission to provide you with exceptional heart care, we have created designated Provider Care Teams.  These Care Teams include your primary Cardiologist (physician) and Advanced Practice Providers (APPs -  Physician Assistants and Nurse Practitioners) who all work together to provide you with the care you need, when you need it. ? ?You will need a follow up appointment in 12 months ? ?Providers on your designated Care Team:   ?Nicolasa Ducking, NP ?Eula Listen, PA-C ?Cadence Fransico Michael, PA-C ? ?COVID-19 Vaccine Information can be found at: [email or web address] For questions related to vaccine distribution or appointments, please email [email or web address] .[email or web address] or call [phone].  ? ?

## 2023-02-12 DIAGNOSIS — R911 Solitary pulmonary nodule: Secondary | ICD-10-CM | POA: Diagnosis not present

## 2023-02-12 DIAGNOSIS — Z720 Tobacco use: Secondary | ICD-10-CM | POA: Diagnosis not present

## 2023-02-12 DIAGNOSIS — Z8521 Personal history of malignant neoplasm of larynx: Secondary | ICD-10-CM | POA: Diagnosis not present

## 2023-02-12 DIAGNOSIS — Z125 Encounter for screening for malignant neoplasm of prostate: Secondary | ICD-10-CM | POA: Diagnosis not present

## 2023-02-12 DIAGNOSIS — R918 Other nonspecific abnormal finding of lung field: Secondary | ICD-10-CM | POA: Diagnosis not present

## 2023-02-12 DIAGNOSIS — J449 Chronic obstructive pulmonary disease, unspecified: Secondary | ICD-10-CM | POA: Diagnosis not present

## 2023-02-12 DIAGNOSIS — I429 Cardiomyopathy, unspecified: Secondary | ICD-10-CM | POA: Diagnosis not present

## 2023-02-12 DIAGNOSIS — I2510 Atherosclerotic heart disease of native coronary artery without angina pectoris: Secondary | ICD-10-CM | POA: Diagnosis not present

## 2023-04-08 ENCOUNTER — Telehealth: Payer: Self-pay | Admitting: Student in an Organized Health Care Education/Training Program

## 2023-04-08 NOTE — Telephone Encounter (Signed)
Pt wife calling in to let us know that [employer] is sending a application for her husband

## 2023-04-14 NOTE — Telephone Encounter (Signed)
Jessie Foot wife is returning phone call. [facility] phone number is [phone].

## 2023-04-14 NOTE — Telephone Encounter (Signed)
I spoke with the patient's wife. They are needed the [employer] patient assistance form mailed to them so they can fill it out.   I have placed the form in the mail.  Nothing further needed.

## 2023-04-14 NOTE — Telephone Encounter (Signed)
Lm x1 for patient.  

## 2023-04-28 NOTE — Telephone Encounter (Signed)
We received the patient's form today. It has been placed in Dr. Doreene Adas folder for signature.

## 2023-05-05 NOTE — Telephone Encounter (Signed)
 I have notified the patient. Nothing further needed.

## 2023-05-05 NOTE — Telephone Encounter (Signed)
 Form has been signed and faxed.  ATC the patient. The phone just rang and then it said it could not be completed.  I will try again later.

## 2023-05-10 ENCOUNTER — Emergency Department: Payer: Medicare HMO

## 2023-05-10 ENCOUNTER — Inpatient Hospital Stay
Admission: EM | Admit: 2023-05-10 | Discharge: 2023-05-16 | DRG: 193 | Disposition: A | Discharge status: Home / Self Care | Payer: Medicare HMO | Attending: Internal Medicine | Admitting: Internal Medicine

## 2023-05-10 ENCOUNTER — Other Ambulatory Visit: Payer: Self-pay

## 2023-05-10 DIAGNOSIS — I2510 Atherosclerotic heart disease of native coronary artery without angina pectoris: Secondary | ICD-10-CM | POA: Diagnosis present

## 2023-05-10 DIAGNOSIS — Z801 Family history of malignant neoplasm of trachea, bronchus and lung: Secondary | ICD-10-CM

## 2023-05-10 DIAGNOSIS — Z716 Tobacco abuse counseling: Secondary | ICD-10-CM

## 2023-05-10 DIAGNOSIS — Z923 Personal history of irradiation: Secondary | ICD-10-CM | POA: Diagnosis not present

## 2023-05-10 DIAGNOSIS — E039 Hypothyroidism, unspecified: Secondary | ICD-10-CM | POA: Diagnosis not present

## 2023-05-10 DIAGNOSIS — F17210 Nicotine dependence, cigarettes, uncomplicated: Secondary | ICD-10-CM | POA: Diagnosis present

## 2023-05-10 DIAGNOSIS — R0602 Shortness of breath: Secondary | ICD-10-CM | POA: Diagnosis present

## 2023-05-10 DIAGNOSIS — Z7901 Long term (current) use of anticoagulants: Secondary | ICD-10-CM

## 2023-05-10 DIAGNOSIS — I255 Ischemic cardiomyopathy: Secondary | ICD-10-CM | POA: Diagnosis present

## 2023-05-10 DIAGNOSIS — J9621 Acute and chronic respiratory failure with hypoxia: Secondary | ICD-10-CM | POA: Diagnosis present

## 2023-05-10 DIAGNOSIS — E890 Postprocedural hypothyroidism: Secondary | ICD-10-CM | POA: Diagnosis present

## 2023-05-10 DIAGNOSIS — Z79899 Other long term (current) drug therapy: Secondary | ICD-10-CM

## 2023-05-10 DIAGNOSIS — R0689 Other abnormalities of breathing: Secondary | ICD-10-CM | POA: Diagnosis not present

## 2023-05-10 DIAGNOSIS — J101 Influenza due to other identified influenza virus with other respiratory manifestations: Principal | ICD-10-CM | POA: Diagnosis present

## 2023-05-10 DIAGNOSIS — Z79890 Hormone replacement therapy (postmenopausal): Secondary | ICD-10-CM

## 2023-05-10 DIAGNOSIS — E782 Mixed hyperlipidemia: Secondary | ICD-10-CM

## 2023-05-10 DIAGNOSIS — I4891 Unspecified atrial fibrillation: Secondary | ICD-10-CM | POA: Diagnosis not present

## 2023-05-10 DIAGNOSIS — I5022 Chronic systolic (congestive) heart failure: Secondary | ICD-10-CM | POA: Diagnosis present

## 2023-05-10 DIAGNOSIS — Z7951 Long term (current) use of inhaled steroids: Secondary | ICD-10-CM

## 2023-05-10 DIAGNOSIS — Z85819 Personal history of malignant neoplasm of unspecified site of lip, oral cavity, and pharynx: Secondary | ICD-10-CM

## 2023-05-10 DIAGNOSIS — J439 Emphysema, unspecified: Secondary | ICD-10-CM | POA: Diagnosis present

## 2023-05-10 DIAGNOSIS — Z7982 Long term (current) use of aspirin: Secondary | ICD-10-CM | POA: Diagnosis not present

## 2023-05-10 DIAGNOSIS — Z8249 Family history of ischemic heart disease and other diseases of the circulatory system: Secondary | ICD-10-CM | POA: Diagnosis not present

## 2023-05-10 DIAGNOSIS — Z86711 Personal history of pulmonary embolism: Secondary | ICD-10-CM | POA: Diagnosis not present

## 2023-05-10 DIAGNOSIS — I480 Paroxysmal atrial fibrillation: Secondary | ICD-10-CM

## 2023-05-10 DIAGNOSIS — I5042 Chronic combined systolic (congestive) and diastolic (congestive) heart failure: Secondary | ICD-10-CM | POA: Diagnosis present

## 2023-05-10 DIAGNOSIS — I2489 Other forms of acute ischemic heart disease: Secondary | ICD-10-CM | POA: Diagnosis present

## 2023-05-10 DIAGNOSIS — I10 Essential (primary) hypertension: Secondary | ICD-10-CM | POA: Diagnosis present

## 2023-05-10 DIAGNOSIS — I700 Atherosclerosis of aorta: Secondary | ICD-10-CM | POA: Diagnosis not present

## 2023-05-10 DIAGNOSIS — Z720 Tobacco use: Secondary | ICD-10-CM | POA: Diagnosis not present

## 2023-05-10 DIAGNOSIS — J432 Centrilobular emphysema: Secondary | ICD-10-CM | POA: Diagnosis not present

## 2023-05-10 DIAGNOSIS — I493 Ventricular premature depolarization: Secondary | ICD-10-CM | POA: Diagnosis not present

## 2023-05-10 DIAGNOSIS — E785 Hyperlipidemia, unspecified: Secondary | ICD-10-CM | POA: Diagnosis present

## 2023-05-10 DIAGNOSIS — J441 Chronic obstructive pulmonary disease with (acute) exacerbation: Principal | ICD-10-CM | POA: Diagnosis present

## 2023-05-10 DIAGNOSIS — I5A Non-ischemic myocardial injury (non-traumatic): Secondary | ICD-10-CM | POA: Diagnosis present

## 2023-05-10 DIAGNOSIS — I25118 Atherosclerotic heart disease of native coronary artery with other forms of angina pectoris: Secondary | ICD-10-CM | POA: Diagnosis not present

## 2023-05-10 DIAGNOSIS — Z8521 Personal history of malignant neoplasm of larynx: Secondary | ICD-10-CM

## 2023-05-10 DIAGNOSIS — I110 Hypertensive heart disease with heart failure: Secondary | ICD-10-CM | POA: Diagnosis present

## 2023-05-10 DIAGNOSIS — I4819 Other persistent atrial fibrillation: Secondary | ICD-10-CM | POA: Diagnosis not present

## 2023-05-10 LAB — BASIC METABOLIC PANEL
Anion gap: 14 (ref 5–15)
BUN: 13 mg/dL (ref 8–23)
CO2: 25 mmol/L (ref 22–32)
Calcium: 9.3 mg/dL (ref 8.9–10.3)
Chloride: 97 mmol/L — ABNORMAL LOW (ref 98–111)
Creatinine, Ser: 0.92 mg/dL (ref 0.61–1.24)
GFR, Estimated: >60 mL/min (ref >60)
Glucose, Bld: 112 mg/dL — ABNORMAL HIGH (ref 70–99)
Potassium: 4.5 mmol/L (ref 3.5–5.1)
Sodium: 136 mmol/L (ref 135–145)

## 2023-05-10 LAB — CBC
HCT: 51.7 % (ref 39.0–52.0)
Hemoglobin: 16.8 g/dL (ref 13.0–17.0)
MCH: 29.3 pg (ref 26.0–34.0)
MCHC: 32.5 g/dL (ref 30.0–36.0)
MCV: 90.1 fL (ref 80.0–100.0)
Platelets: 180 10*3/uL (ref 150–400)
RBC: 5.74 MIL/uL (ref 4.22–5.81)
RDW: 13.2 % (ref 11.5–15.5)
WBC: 11.5 10*3/uL — ABNORMAL HIGH (ref 4.0–10.5)
nRBC: 0 % (ref 0.0–0.2)

## 2023-05-10 LAB — RESP PANEL BY RT-PCR (RSV, FLU A&B, COVID)  RVPGX2
Influenza A by PCR: POSITIVE — AB
Influenza B by PCR: NEGATIVE
Resp Syncytial Virus by PCR: NEGATIVE
SARS Coronavirus 2 by RT PCR: NEGATIVE

## 2023-05-10 LAB — TROPONIN I (HIGH SENSITIVITY)
Troponin I (High Sensitivity): 13 ng/L (ref <18)
Troponin I (High Sensitivity): 21 ng/L — ABNORMAL HIGH (ref <18)

## 2023-05-10 MED ORDER — IPRATROPIUM-ALBUTEROL 0.5-2.5 (3) MG/3ML IN SOLN
3.0000 mL | Freq: Once | RESPIRATORY_TRACT | Status: AC
  Administered 2023-05-10: 3 mL via RESPIRATORY_TRACT
  Filled 2023-05-10: qty 3

## 2023-05-10 MED ORDER — MAGNESIUM SULFATE 2 GM/50ML IV SOLN
2.0000 g | INTRAVENOUS | Status: AC
  Administered 2023-05-10: 2 g via INTRAVENOUS
  Filled 2023-05-10: qty 50

## 2023-05-10 MED ORDER — AZITHROMYCIN 500 MG PO TABS: 500.0000 mg | ORAL_TABLET | Freq: Every day | ORAL | Status: DC

## 2023-05-10 MED ORDER — ASPIRIN 81 MG PO TBEC
81.0000 mg | DELAYED_RELEASE_TABLET | Freq: Every day | ORAL | Status: DC
  Administered 2023-05-12 – 2023-05-14 (×3): 81 mg via ORAL
  Filled 2023-05-10 (×4): qty 1

## 2023-05-10 MED ORDER — ENOXAPARIN SODIUM 40 MG/0.4ML IJ SOSY
40.0000 mg | PREFILLED_SYRINGE | INTRAMUSCULAR | Status: DC
  Administered 2023-05-11 – 2023-05-13 (×3): 40 mg via SUBCUTANEOUS
  Filled 2023-05-10 (×3): qty 0.4

## 2023-05-10 MED ORDER — IPRATROPIUM-ALBUTEROL 0.5-2.5 (3) MG/3ML IN SOLN
3.0000 mL | RESPIRATORY_TRACT | Status: DC
Start: 2023-05-11 — End: 2023-05-14
  Administered 2023-05-11 – 2023-05-14 (×19): 3 mL via RESPIRATORY_TRACT
  Filled 2023-05-10 (×17): qty 3

## 2023-05-10 MED ORDER — HYDRALAZINE HCL 20 MG/ML IJ SOLN: 5.0000 mg | INTRAMUSCULAR | Status: DC | PRN

## 2023-05-10 MED ORDER — IPRATROPIUM-ALBUTEROL 0.5-2.5 (3) MG/3ML IN SOLN
3.0000 mL | Freq: Once | RESPIRATORY_TRACT | Status: AC
  Administered 2023-05-11: 3 mL via RESPIRATORY_TRACT
  Filled 2023-05-10: qty 3

## 2023-05-10 MED ORDER — ACETAMINOPHEN 325 MG PO TABS
650.0000 mg | ORAL_TABLET | Freq: Four times a day (QID) | ORAL | Status: DC | PRN
  Administered 2023-05-12 – 2023-05-16 (×3): 650 mg via ORAL
  Filled 2023-05-10 (×4): qty 2

## 2023-05-10 MED ORDER — METHYLPREDNISOLONE SODIUM SUCC 125 MG IJ SOLR
125.0000 mg | INTRAMUSCULAR | Status: AC
  Administered 2023-05-10: 125 mg via INTRAVENOUS
  Filled 2023-05-10: qty 2

## 2023-05-10 MED ORDER — NICOTINE 21 MG/24HR TD PT24
21.0000 mg | MEDICATED_PATCH | Freq: Every day | TRANSDERMAL | Status: DC
Start: 2023-05-11 — End: 2023-05-16
  Administered 2023-05-11 – 2023-05-16 (×6): 21 mg via TRANSDERMAL
  Filled 2023-05-10 (×6): qty 1

## 2023-05-10 MED ORDER — ONDANSETRON HCL 4 MG/2ML IJ SOLN: 4.0000 mg | Freq: Three times a day (TID) | INTRAMUSCULAR | Status: DC | PRN

## 2023-05-10 MED ORDER — DM-GUAIFENESIN ER 30-600 MG PO TB12
1.0000 | ORAL_TABLET | Freq: Two times a day (BID) | ORAL | Status: DC | PRN
  Administered 2023-05-12 – 2023-05-16 (×3): 1 via ORAL
  Filled 2023-05-10 (×4): qty 1

## 2023-05-10 MED ORDER — AZITHROMYCIN 500 MG PO TABS: 250.0000 mg | ORAL_TABLET | Freq: Every day | ORAL | Status: DC

## 2023-05-10 MED ORDER — ALBUTEROL SULFATE (2.5 MG/3ML) 0.083% IN NEBU
2.5000 mg | INHALATION_SOLUTION | RESPIRATORY_TRACT | Status: DC | PRN
  Administered 2023-05-13: 2.5 mg via RESPIRATORY_TRACT
  Filled 2023-05-10: qty 3

## 2023-05-10 MED ORDER — IOHEXOL 350 MG/ML SOLN
80.0000 mL | Freq: Once | INTRAVENOUS | Status: AC | PRN
  Administered 2023-05-10: 80 mL via INTRAVENOUS

## 2023-05-10 NOTE — ED Triage Notes (Signed)
 Pt to ED via [facility] from home. PT reports SOB today. Pt with hx COPD and asthma. Pt labored on arrival and RA 93%. Pt placed on 2L [place] for WOB. Pt was dx with Flu.

## 2023-05-10 NOTE — ED Triage Notes (Signed)
 First Nurse note:  Per EMS, Pt, from home, c/o SOB starting today.  Hx of COPD and asthma.  Pt used inhaler x3 w/o relief.    Pt's wife was recently diagnosed w/ the flu.    HR 107 BP 178/118 O2 94% RA

## 2023-05-10 NOTE — ED Provider Notes (Addendum)
 [facility] Provider Note    Event Date/Time   First MD Initiated Contact with Patient 05/10/23 2006     (approximate)   History   Chief Complaint: Shortness of Breath   HPI  Benjamin Torres is a 68 y.o. male with a past history of pulmonary embolism, laryngeal cancer, COPD/emphysema, smoking who comes the ED complaining of shortness of breath for the last couple days with associated productive cough.  No fever or chills.  Does not use oxygen at home.  Does endorse dyspnea on exertion.  No leg swelling.  No vomiting or diarrhea.  Eating okay.  Tried using inhalers at home without relief.    02/10/2023 cardiology visit: Coronary artery disease with stable angina Occluded RCA noted 2021 with collaterals from left to right, also occluded OM High risk of worsening coronary disease given continued smoking not a diabetic LDL above goal, changes as below Recommend he call us  for anginal symptoms   Cardiomyopathy, ischemic [facility] notes reviewed, EF estimated 40% in May 2021 Repeat echocardiogram EF 45 to 50% in 2024 Plan as above   COPD/emphysema Long history of smoking, nodules noted in his lung CT Followed by pulmonary and oncology Prior history of throat cancer    Hyperlipidemia Recommend he continue Crestor  10 daily, add Zetia  10 daily to achieve goal LDL less than 55      Physical Exam   Triage Vital Signs: ED Triage Vitals [05/10/23 1853]  Encounter Vitals Group     BP (!) 164/111     Systolic BP Percentile      Diastolic BP Percentile      Pulse Rate (!) 110     Resp (!) 28     Temp 98.6 F (37 C)     Temp Source Oral     SpO2 93 %     Weight      Height      Head Circumference      Peak Flow      Pain Score 0     Pain Loc      Pain Education      Exclude from Growth Chart     Most recent vital signs: Vitals:   05/10/23 2100 05/10/23 2230  BP: (!) 144/95 (!) 121/91  Pulse: (!) 102 93  Resp: (!) 26 (!) 22  Temp:     SpO2: 98% 96%    General: Awake, no distress.  CV:  Good peripheral perfusion.  Tachycardia heart rate 110 Resp:  Tachypnea, accessory muscle use.  Quiet breath sounds diffusely.  Barrel chested Abd:  No distention.  Soft nontender Other:  No lower extremity edema   ED Results / Procedures / Treatments   Labs (all labs ordered are listed, but only abnormal results are displayed) Labs Reviewed  CBC - Abnormal; Notable for the following components:      Result Value   WBC 11.5 (*)    All other components within normal limits  BASIC METABOLIC PANEL - Abnormal; Notable for the following components:   Chloride 97 (*)    Glucose, Bld 112 (*)    All other components within normal limits  TROPONIN I (HIGH SENSITIVITY) - Abnormal; Notable for the following components:   Troponin I (High Sensitivity) 21 (*)    All other components within normal limits  RESP PANEL BY RT-PCR (RSV, FLU A&B, COVID)  RVPGX2  TROPONIN I (HIGH SENSITIVITY)     EKG Interpreted by me Sinus tachycardia rate 113.  Normal axis and intervals.  Poor R wave progression.  Normal ST segments and T waves   RADIOLOGY Chest x-ray interpreted by me, unremarkable without consolidation effusion or edema.  Radiology report reviewed   PROCEDURES:  Procedures   MEDICATIONS ORDERED IN ED: Medications  ipratropium-albuterol  (DUONEB) 0.5-2.5 (3) MG/3ML nebulizer solution 3 mL (has no administration in time range)  methylPREDNISolone  sodium succinate (SOLU-MEDROL ) 125 mg/2 mL injection 125 mg (125 mg Intravenous Given 05/10/23 2046)  magnesium  sulfate IVPB 2 g 50 mL (0 g Intravenous Stopped 05/10/23 2215)  ipratropium-albuterol  (DUONEB) 0.5-2.5 (3) MG/3ML nebulizer solution 3 mL (3 mLs Nebulization Given 05/10/23 2050)  ipratropium-albuterol  (DUONEB) 0.5-2.5 (3) MG/3ML nebulizer solution 3 mL (3 mLs Nebulization Given 05/10/23 2050)  iohexol  (OMNIPAQUE ) 350 MG/ML injection 80 mL (80 mLs Intravenous Contrast Given 05/10/23  2214)     IMPRESSION / MDM / ASSESSMENT AND PLAN / ED COURSE  I reviewed the triage vital signs and the nursing notes.  DDx: Pneumonia, pneumothorax, COPD exacerbation, pleural effusion, pulmonary embolism, non-STEMI, COVID, influenza, RSV  Patient's presentation is most consistent with acute presentation with potential threat to life or bodily function.  Patient presents with shortness of breath, increased work of breathing most likely COPD exacerbation.  Currently not on anticoagulants and has a history of PE as well as cancer, will need CTA of the chest to rule out acute pulmonary embolism.  Will give Solu-Medrol , magnesium  bolus, bronchodilators.  On my initial assessment, I doubt sepsis.  ----------------------------------------- 10:53 PM on 05/10/2023 ----------------------------------------- CTA neg for PE or pna. Viral swab still pending. After medications, pt feels mildly improved but still markedly symptomatic from COPD exac. Still has diminished air movement BL and exp wheezing. Will need to admit for further treatment.  ----------------------------------------- 11:12 PM on 05/10/2023 ----------------------------------------- Case d/w hospitalist     FINAL CLINICAL IMPRESSION(S) / ED DIAGNOSES   Final diagnoses:  COPD exacerbation (HCC)     Rx / DC Orders   ED Discharge Orders     None        Note:  This document was prepared using Dragon voice recognition software and may include unintentional dictation errors.   Viviann Pastor, MD 05/10/23 2254    Viviann Pastor, MD 05/10/23 2312

## 2023-05-10 NOTE — H&P (Signed)
 History and Physical    Benjamin Torres FMW:[id number] DOB: [birth year] DOA: 05/10/2023  Referring MD/NP/PA:   PCP: Cleotilde Oneil FALCON, MD   Patient coming from:  The patient is coming from home.     Chief Complaint: SOB  HPI: Benjamin Torres is a 68 y.o. 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  Patient states that he started having shortness of breath today, which has been progressively worsening.  Patient has fatigue, mild fever, chills.  Denies sore throat. H has dry cough, no chest pain.  No nausea, vomiting, diarrhea or abdominal pain.  No symptoms of UTI. Patient has sick contacts with her wife who recently had Flu-like symptoms.    Data reviewed independently and ED Course: pt was found to have WBC 11.5, GFR> 60, trop  13 --> 21, positive Flu A. Temperature normal, blood pressure 176/109--> 121/91, heart rate 110--> 93, RR 28--> 22, oxygen saturation 93% on room air (patient was started on 2 L oxygen due to increased work of breathing).  Chest x-ray negative.  CTA negative for PE.  Patient is placed on telemetry bed for observation.   EKG: I have personally reviewed.  Sinus rhythm, QTc 452, with artificial effects, poor R wave progression, anteroseptal infarction pattern   Review of Systems:   General: no fevers, chills, no body weight gain, has fatigue HEENT: no blurry vision, hearing changes or sore throat Respiratory: has dyspnea, coughing, wheezing CV: no chest pain, no palpitations GI: no nausea, vomiting, abdominal pain, diarrhea, constipation GU: no dysuria, burning on urination, increased urinary frequency, hematuria  Ext: no leg edema Neuro: no unilateral weakness, numbness, or tingling, no vision change or hearing loss Skin: no rash, no  skin tear. MSK: No muscle spasm, no deformity, no limitation of range of movement in spin Heme: No easy bruising.  Travel history: No recent long distant travel.   Allergy: No Known Allergies  Past Medical History:  Diagnosis Date   Cancer of larynx (HCC) 11/06/2014   COPD (chronic obstructive pulmonary disease) (HCC)    Hypothyroidism    PE (pulmonary thromboembolism) (HCC)    right lung, over 10 yrs ago    Past Surgical History:  Procedure Laterality Date   ANAL FISSURE REPAIR     APPENDECTOMY     BIOPSY PHARYNX  2015   Larynx BX   COLONOSCOPY WITH PROPOFOL  N/A 03/07/2017   Procedure: COLONOSCOPY WITH PROPOFOL ;  Surgeon: Jinny Carmine, MD;  Location: [facility];  Service: Endoscopy;  Laterality: N/A;   ESOPHAGOGASTRODUODENOSCOPY (EGD) WITH PROPOFOL  N/A 03/07/2017   Procedure: ESOPHAGOGASTRODUODENOSCOPY (EGD) WITH PROPOFOL ;  Surgeon: Jinny Carmine, MD;  Location: [facility];  Service: Endoscopy;  Laterality: N/A;   LEFT HEART CATH AND CORONARY ANGIOGRAPHY Left 09/23/2019   Procedure: LEFT HEART CATH AND CORONARY ANGIOGRAPHY;  Surgeon: Bosie Vinie LABOR, MD;  Location: [facility];  Service: Cardiovascular;  Laterality: Left;   POLYPECTOMY  03/07/2017   Procedure: POLYPECTOMY;  Surgeon: Jinny Carmine, MD;  Location: [facility];  Service: Endoscopy;;    Social History:  reports that he has been smoking e-cigarettes and cigarettes. He started smoking about 50 years ago. He has a 60 pack-year smoking history. He has never used smokeless tobacco. He reports that he does not drink alcohol and does not  use drugs.  Family History:  Family History  Problem Relation Age of Onset   CAD Mother    Cancer - Lung Father      Prior to Admission medications   Medication Sig Start Date End Date Taking? Authorizing Provider  acetaminophen -codeine (TYLENOL  #3) 300-30 MG tablet Take 1 tablet by mouth as needed. 06/12/22   [provider]  albuterol   (PROVENTIL  HFA;VENTOLIN  HFA) 108 (90 Base) MCG/ACT inhaler Inhale 1 puff into the lungs every 4 (four) hours as needed for wheezing or shortness of breath.    [provider]  Budeson-Glycopyrrol-Formoterol  (BREZTRI  AEROSPHERE) 160-9-4.8 MCG/ACT AERO Inhale 2 puffs into the lungs in the morning and at bedtime. 02/03/23   Isadora Hose, MD  Budeson-Glycopyrrol-Formoterol  (BREZTRI  AEROSPHERE) 160-9-4.8 MCG/ACT AERO Inhale 2 puffs into the lungs in the morning and at bedtime. Patient not taking: Reported on 02/10/2023 02/03/23   Isadora Hose, MD  cyanocobalamin  (,VITAMIN B-12,) 1000 MCG/ML injection Inject 1,000 mcg into the muscle every 30 (thirty) days. 10/23/21   [provider]  ezetimibe  (ZETIA ) 10 MG tablet Take 1 tablet (10 mg total) by mouth daily. 02/10/23 05/11/23  Gollan, Timothy J, MD  gabapentin  (NEURONTIN ) 100 MG capsule Take 100 mg by mouth daily. 05/27/22   [provider]  ibuprofen (ADVIL) 200 MG tablet Take 400 mg by mouth as needed for headache.    [provider]  levothyroxine  (SYNTHROID ) 75 MCG tablet Take by mouth. 08/02/21   [provider]  metoprolol  succinate (TOPROL -XL) 25 MG 24 hr tablet Take 25 mg by mouth daily. 09/14/19   [provider]  montelukast  (SINGULAIR ) 10 MG tablet Take 10 mg by mouth daily. 05/07/22   [provider]  rosuvastatin  (CRESTOR ) 10 MG tablet Take 10 mg by mouth at bedtime. 07/12/20   [provider]  tamsulosin  (FLOMAX ) 0.4 MG CAPS capsule Take 1 capsule (0.4 mg total) by mouth daily. 09/22/18   Francisca Redell BROCKS, MD    Physical Exam: Vitals:   05/10/23 2030 05/10/23 2100 05/10/23 2230 05/10/23 2344  BP: (!) 176/109 (!) 144/95 (!) 121/91   Pulse: (!) 107 (!) 102 93   Resp: (!) 28 (!) 26 (!) 22   Temp:      TempSrc:      SpO2: 97% 98% 96%   Weight:    70.3 kg  Height:    5' 6 (1.676 m)   General: Not in acute distress HEENT:       Eyes: PERRL, EOMI, no jaundice       ENT:  No discharge from the ears and nose, no pharynx injection, no tonsillar enlargement.        Neck: No JVD, no bruit, no mass felt. Heme: No neck lymph node enlargement. Cardiac: S1/S2, RRR, No murmurs, No gallops or rubs. Respiratory: Has wheezing bilaterally GI: Soft, nondistended, nontender, no rebound pain, no organomegaly, BS present. GU: No hematuria Ext: No pitting leg edema bilaterally. 1+DP/PT pulse bilaterally. Musculoskeletal: No joint deformities, No joint redness or warmth, no limitation of ROM in spin. Skin: No rashes.  Neuro: Alert, oriented X3, cranial nerves II-XII grossly intact, moves all extremities normally.  Psych: Patient is not psychotic, no suicidal or hemocidal ideation.  Labs on Admission: I have personally reviewed following labs and imaging studies  CBC: Recent Labs  Lab 05/10/23 1856  WBC 11.5*  HGB 16.8  HCT 51.7  MCV 90.1  PLT 180   Basic Metabolic Panel: Recent Labs  Lab 05/10/23 2106  NA 136  K 4.5  CL 97*  CO2 25  GLUCOSE 112*  BUN 13  CREATININE 0.92  CALCIUM  9.3   GFR: Estimated Creatinine Clearance: 69.3 mL/min (by C-G formula based on SCr of 0.92 mg/dL). Liver Function Tests: No results for input(s): AST, ALT, ALKPHOS, BILITOT, PROT, ALBUMIN in the last 168 hours. No results for input(s): LIPASE, AMYLASE in the last 168 hours. No results for input(s): AMMONIA in the last 168 hours. Coagulation Profile: No results for input(s): INR, PROTIME in the last 168 hours. Cardiac Enzymes: No results for input(s): CKTOTAL, CKMB, CKMBINDEX, TROPONINI in the last 168 hours. BNP (last 3 results) No results for input(s): PROBNP in the last 8760 hours. HbA1C: No results for input(s): HGBA1C in the last 72 hours. CBG: No results for input(s): GLUCAP in the last 168 hours. Lipid Profile: No results for input(s): CHOL, HDL, LDLCALC, TRIG, CHOLHDL, LDLDIRECT in the last 72 hours. Thyroid  Function  Tests: No results for input(s): TSH, T4TOTAL, FREET4, T3FREE, THYROIDAB in the last 72 hours. Anemia Panel: No results for input(s): VITAMINB12, FOLATE, FERRITIN, TIBC, IRON , RETICCTPCT in the last 72 hours. Urine analysis:    Component Value Date/Time   COLORURINE YELLOW (A) 08/09/2020 1046   APPEARANCEUR CLEAR (A) 08/09/2020 1046   APPEARANCEUR Clear 03/06/2017 1414   LABSPEC 1.018 08/09/2020 1046   PHURINE 5.0 08/09/2020 1046   GLUCOSEU NEGATIVE 08/09/2020 1046   HGBUR SMALL (A) 08/09/2020 1046   BILIRUBINUR NEGATIVE 08/09/2020 1046   BILIRUBINUR Negative 03/06/2017 1414   KETONESUR 20 (A) 08/09/2020 1046   PROTEINUR 100 (A) 08/09/2020 1046   NITRITE NEGATIVE 08/09/2020 1046   LEUKOCYTESUR NEGATIVE 08/09/2020 1046   Sepsis Labs: @LABRCNTIP (procalcitonin:4,lacticidven:4) ) Recent Results (from the past 240 hours)  Resp panel by RT-PCR (RSV, Flu A&B, Covid) Anterior Nasal Swab     Status: Abnormal   Collection Time: 05/10/23  8:54 PM   Specimen: Anterior Nasal Swab  Result Value Ref Range Status   SARS Coronavirus 2 by RT PCR NEGATIVE NEGATIVE Final    Comment: (NOTE) SARS-CoV-2 target nucleic acids are NOT DETECTED.  The SARS-CoV-2 RNA is generally detectable in upper respiratory specimens during the acute phase of infection. The lowest concentration of SARS-CoV-2 viral copies this assay can detect is 138 copies/mL. A negative result does not preclude SARS-Cov-2 infection and should not be used as the sole basis for treatment or other patient management decisions. A negative result may occur with  improper specimen collection/handling, submission of specimen other than nasopharyngeal swab, presence of viral mutation(s) within the areas targeted by this assay, and inadequate number of viral copies(<138 copies/mL). A negative result must be combined with clinical observations, patient history, and epidemiological information. The expected result is  Negative.  Fact Sheet for Patients:  bloggercourse.com  Fact Sheet for Healthcare Providers:  seriousbroker.it  This test is no t yet approved or cleared by the [place]  FDA and  has been authorized for detection and/or diagnosis of SARS-CoV-2 by FDA under an Emergency Use Authorization (EUA). This EUA will remain  in effect (meaning this test can be used) for the duration of the COVID-19 declaration under Section 564(b)(1) of the Act, 21 U.S.C.section 360bbb-3(b)(1), unless the authorization is terminated  or revoked sooner.       Influenza A by PCR POSITIVE (A) NEGATIVE Final   Influenza B by PCR NEGATIVE NEGATIVE Final    Comment: (NOTE) The Xpert Xpress SARS-CoV-2/FLU/RSV plus assay is intended as an aid in the diagnosis of  influenza from Nasopharyngeal swab specimens and should not be used as a sole basis for treatment. Nasal washings and aspirates are unacceptable for Xpert Xpress SARS-CoV-2/FLU/RSV testing.  Fact Sheet for Patients: bloggercourse.com  Fact Sheet for Healthcare Providers: seriousbroker.it  This test is not yet approved or cleared by the [place]  FDA and has been authorized for detection and/or diagnosis of SARS-CoV-2 by FDA under an Emergency Use Authorization (EUA). This EUA will remain in effect (meaning this test can be used) for the duration of the COVID-19 declaration under Section 564(b)(1) of the Act, 21 U.S.C. section 360bbb-3(b)(1), unless the authorization is terminated or revoked.     Resp Syncytial Virus by PCR NEGATIVE NEGATIVE Final    Comment: (NOTE) Fact Sheet for Patients: bloggercourse.com  Fact Sheet for Healthcare Providers: seriousbroker.it  This test is not yet approved or cleared by the [place]  FDA and has been authorized for detection and/or diagnosis of  SARS-CoV-2 by FDA under an Emergency Use Authorization (EUA). This EUA will remain in effect (meaning this test can be used) for the duration of the COVID-19 declaration under Section 564(b)(1) of the Act, 21 U.S.C. section 360bbb-3(b)(1), unless the authorization is terminated or revoked.  Performed at [facility], [address], [place], [place] [address]      Radiological Exams on Admission:   Assessment/Plan Principal Problem:   COPD exacerbation (HCC) Active Problems:   Influenza A   Chronic systolic CHF (congestive heart failure) (HCC)   CAD (coronary artery disease)   Myocardial injury   Acquired hypothyroidism   Hyperlipidemia   HTN (hypertension)   Tobacco use   Assessment and Plan:   COPD exacerbation due to Influenza A infection: Oxygen saturation 93% on room air, no infiltrate on chest x-ray.  Has mild leukocytosis with WBC 11.5, no fever, clinically does not seem to have sepsis.  -Will place to tele bed for observation -Bronchodilators and sigular -Patient received 2 g of magnesium  sulfate in ED -Solu-Medrol  125 mg, then 80 mg daily -Tamiflu  75 mg twice daily -Mucinex  for cough  -Incentive spirometry -sputum culture -Nasal cannula oxygen as needed to maintain O2 saturation 93% or greater  Chronic systolic CHF (congestive heart failure) (HCC): 2D echo on 07/20/2022 showed EF of 45-50%.  Patient does not have leg edema JVD.  No pulm edema on chest x-ray.  CHF seem to be compensated. -Check BNP  History of CAD (coronary artery disease) and myocardial injury: Troponin level 13 --> 21 -Trend troponin -Check A1c, FLP -Start aspirin  81 mg daily -Continue home Crestor   Acquired hypothyroidism -Synthroid   Hyperlipidemia -Crestor  and Zetia   HTN (hypertension) -IV hydralazine  as needed -Metoprolol , cozarr  Tobacco use: -Did counseling about importance of quitting tobacco use -Nicotine  patch       DVT ppx: SQ Lovenox   Code Status:  Full code    Family Communication:  Yes, patient's brother and sister at bed side.        Disposition Plan:  Anticipate discharge back to previous environment  Consults called:  none   Admission status and Level of care: Telemetry Medical:    for obs     Dispo: The patient is from: Home              Anticipated d/c is to: Home              Anticipated d/c date is: 1 day              Patient currently is not medically  stable to d/c.    Severity of Illness:  The appropriate patient status for this patient is OBSERVATION. Observation status is judged to be reasonable and necessary in order to provide the required intensity of service to ensure the patient's safety. The patient's presenting symptoms, physical exam findings, and initial radiographic and laboratory data in the context of their medical condition is felt to place them at decreased risk for further clinical deterioration. Furthermore, it is anticipated that the patient will be medically stable for discharge from the hospital within 2 midnights of admission.        Date of Service 05/11/2023    Caleb Exon [facility]   If 7PM-7AM, please contact night-coverage [email or web address] 05/11/2023, 12:10 AM

## 2023-05-11 ENCOUNTER — Other Ambulatory Visit: Payer: Self-pay

## 2023-05-11 ENCOUNTER — Encounter: Payer: Self-pay | Admitting: Internal Medicine

## 2023-05-11 DIAGNOSIS — J439 Emphysema, unspecified: Secondary | ICD-10-CM | POA: Diagnosis present

## 2023-05-11 DIAGNOSIS — Z7982 Long term (current) use of aspirin: Secondary | ICD-10-CM | POA: Diagnosis not present

## 2023-05-11 DIAGNOSIS — J101 Influenza due to other identified influenza virus with other respiratory manifestations: Secondary | ICD-10-CM | POA: Diagnosis present

## 2023-05-11 DIAGNOSIS — J441 Chronic obstructive pulmonary disease with (acute) exacerbation: Secondary | ICD-10-CM | POA: Diagnosis present

## 2023-05-11 DIAGNOSIS — I5022 Chronic systolic (congestive) heart failure: Secondary | ICD-10-CM | POA: Diagnosis not present

## 2023-05-11 DIAGNOSIS — Z7901 Long term (current) use of anticoagulants: Secondary | ICD-10-CM | POA: Diagnosis not present

## 2023-05-11 DIAGNOSIS — Z8521 Personal history of malignant neoplasm of larynx: Secondary | ICD-10-CM | POA: Diagnosis not present

## 2023-05-11 DIAGNOSIS — Z79899 Other long term (current) drug therapy: Secondary | ICD-10-CM | POA: Diagnosis not present

## 2023-05-11 DIAGNOSIS — I25118 Atherosclerotic heart disease of native coronary artery with other forms of angina pectoris: Secondary | ICD-10-CM | POA: Diagnosis not present

## 2023-05-11 DIAGNOSIS — R0602 Shortness of breath: Secondary | ICD-10-CM | POA: Diagnosis present

## 2023-05-11 DIAGNOSIS — F17210 Nicotine dependence, cigarettes, uncomplicated: Secondary | ICD-10-CM | POA: Diagnosis present

## 2023-05-11 DIAGNOSIS — I2489 Other forms of acute ischemic heart disease: Secondary | ICD-10-CM | POA: Diagnosis present

## 2023-05-11 DIAGNOSIS — I4891 Unspecified atrial fibrillation: Secondary | ICD-10-CM | POA: Diagnosis not present

## 2023-05-11 DIAGNOSIS — Z716 Tobacco abuse counseling: Secondary | ICD-10-CM | POA: Diagnosis not present

## 2023-05-11 DIAGNOSIS — Z86711 Personal history of pulmonary embolism: Secondary | ICD-10-CM | POA: Diagnosis not present

## 2023-05-11 DIAGNOSIS — I480 Paroxysmal atrial fibrillation: Secondary | ICD-10-CM | POA: Diagnosis present

## 2023-05-11 DIAGNOSIS — Z79890 Hormone replacement therapy (postmenopausal): Secondary | ICD-10-CM | POA: Diagnosis not present

## 2023-05-11 DIAGNOSIS — Z923 Personal history of irradiation: Secondary | ICD-10-CM | POA: Diagnosis not present

## 2023-05-11 DIAGNOSIS — E890 Postprocedural hypothyroidism: Secondary | ICD-10-CM | POA: Diagnosis present

## 2023-05-11 DIAGNOSIS — E785 Hyperlipidemia, unspecified: Secondary | ICD-10-CM | POA: Diagnosis present

## 2023-05-11 DIAGNOSIS — I255 Ischemic cardiomyopathy: Secondary | ICD-10-CM | POA: Diagnosis present

## 2023-05-11 DIAGNOSIS — I5042 Chronic combined systolic (congestive) and diastolic (congestive) heart failure: Secondary | ICD-10-CM | POA: Diagnosis present

## 2023-05-11 DIAGNOSIS — Z720 Tobacco use: Secondary | ICD-10-CM | POA: Diagnosis not present

## 2023-05-11 DIAGNOSIS — I110 Hypertensive heart disease with heart failure: Secondary | ICD-10-CM | POA: Diagnosis present

## 2023-05-11 DIAGNOSIS — Z85819 Personal history of malignant neoplasm of unspecified site of lip, oral cavity, and pharynx: Secondary | ICD-10-CM | POA: Diagnosis not present

## 2023-05-11 DIAGNOSIS — I2510 Atherosclerotic heart disease of native coronary artery without angina pectoris: Secondary | ICD-10-CM | POA: Diagnosis present

## 2023-05-11 DIAGNOSIS — I5A Non-ischemic myocardial injury (non-traumatic): Secondary | ICD-10-CM | POA: Diagnosis present

## 2023-05-11 DIAGNOSIS — Z8249 Family history of ischemic heart disease and other diseases of the circulatory system: Secondary | ICD-10-CM | POA: Diagnosis not present

## 2023-05-11 DIAGNOSIS — J9621 Acute and chronic respiratory failure with hypoxia: Secondary | ICD-10-CM | POA: Diagnosis present

## 2023-05-11 LAB — TROPONIN I (HIGH SENSITIVITY): Troponin I (High Sensitivity): 34 ng/L — ABNORMAL HIGH (ref <18)

## 2023-05-11 LAB — BRAIN NATRIURETIC PEPTIDE: B Natriuretic Peptide: 137.8 pg/mL — ABNORMAL HIGH (ref 0.0–100.0)

## 2023-05-11 LAB — LIPID PANEL
Cholesterol: 80 mg/dL (ref 0–200)
HDL: 45 mg/dL (ref >40)
LDL Cholesterol: 26 mg/dL (ref 0–99)
Total CHOL/HDL Ratio: 1.8 {ratio}
Triglycerides: 45 mg/dL (ref <150)
VLDL: 9 mg/dL (ref 0–40)

## 2023-05-11 LAB — HEMOGLOBIN A1C
Hgb A1c MFr Bld: 5.5 % (ref 4.8–5.6)
Mean Plasma Glucose: 111.15 mg/dL

## 2023-05-11 LAB — HIV ANTIBODY (ROUTINE TESTING W REFLEX): HIV Screen 4th Generation wRfx: NONREACTIVE

## 2023-05-11 MED ORDER — LORAZEPAM 2 MG/ML IJ SOLN
1.0000 mg | Freq: Once | INTRAMUSCULAR | Status: AC
  Administered 2023-05-11: 1 mg via INTRAVENOUS
  Filled 2023-05-11: qty 1

## 2023-05-11 MED ORDER — ALPRAZOLAM 0.5 MG PO TABS
0.5000 mg | ORAL_TABLET | Freq: Two times a day (BID) | ORAL | Status: DC
  Administered 2023-05-11 – 2023-05-16 (×12): 0.5 mg via ORAL
  Filled 2023-05-11: qty 1
  Filled 2023-05-11: qty 2
  Filled 2023-05-11 (×10): qty 1

## 2023-05-11 MED ORDER — FAMOTIDINE 20 MG PO TABS
10.0000 mg | ORAL_TABLET | Freq: Two times a day (BID) | ORAL | Status: DC | PRN
  Administered 2023-05-11 – 2023-05-12 (×2): 10 mg via ORAL
  Filled 2023-05-11 (×3): qty 1

## 2023-05-11 MED ORDER — OSELTAMIVIR PHOSPHATE 75 MG PO CAPS
75.0000 mg | ORAL_CAPSULE | Freq: Two times a day (BID) | ORAL | Status: AC
  Administered 2023-05-11 – 2023-05-15 (×9): 75 mg via ORAL
  Filled 2023-05-11 (×12): qty 1

## 2023-05-11 MED ORDER — HYDROXYZINE HCL 10 MG PO TABS
10.0000 mg | ORAL_TABLET | Freq: Three times a day (TID) | ORAL | Status: DC | PRN
  Filled 2023-05-11: qty 1

## 2023-05-11 MED ORDER — MONTELUKAST SODIUM 10 MG PO TABS
10.0000 mg | ORAL_TABLET | Freq: Every day | ORAL | Status: DC
Start: 2023-05-11 — End: 2023-05-16
  Administered 2023-05-12 – 2023-05-16 (×5): 10 mg via ORAL
  Filled 2023-05-11 (×6): qty 1

## 2023-05-11 MED ORDER — GABAPENTIN 100 MG PO CAPS
100.0000 mg | ORAL_CAPSULE | Freq: Every day | ORAL | Status: DC
  Administered 2023-05-12 – 2023-05-16 (×5): 100 mg via ORAL
  Filled 2023-05-11 (×6): qty 1

## 2023-05-11 MED ORDER — LOSARTAN POTASSIUM 50 MG PO TABS
50.0000 mg | ORAL_TABLET | Freq: Every day | ORAL | Status: DC
Start: 2023-05-11 — End: 2023-05-14
  Administered 2023-05-12 – 2023-05-14 (×3): 50 mg via ORAL
  Filled 2023-05-11 (×4): qty 1

## 2023-05-11 MED ORDER — METOPROLOL SUCCINATE ER 25 MG PO TB24
25.0000 mg | ORAL_TABLET | Freq: Every day | ORAL | Status: DC
  Administered 2023-05-12 – 2023-05-14 (×3): 25 mg via ORAL
  Filled 2023-05-11 (×4): qty 1

## 2023-05-11 MED ORDER — METHYLPREDNISOLONE SODIUM SUCC 125 MG IJ SOLR
80.0000 mg | Freq: Every day | INTRAMUSCULAR | Status: DC
  Administered 2023-05-11: 80 mg via INTRAVENOUS
  Filled 2023-05-11: qty 2

## 2023-05-11 MED ORDER — METHYLPREDNISOLONE SODIUM SUCC 40 MG IJ SOLR
40.0000 mg | Freq: Three times a day (TID) | INTRAMUSCULAR | Status: DC
  Administered 2023-05-11 – 2023-05-16 (×15): 40 mg via INTRAVENOUS
  Filled 2023-05-11 (×15): qty 1

## 2023-05-11 MED ORDER — EZETIMIBE 10 MG PO TABS
10.0000 mg | ORAL_TABLET | Freq: Every day | ORAL | Status: DC
  Administered 2023-05-12 – 2023-05-16 (×5): 10 mg via ORAL
  Filled 2023-05-11 (×6): qty 1

## 2023-05-11 MED ORDER — ROSUVASTATIN CALCIUM 10 MG PO TABS
10.0000 mg | ORAL_TABLET | Freq: Every day | ORAL | Status: DC
  Administered 2023-05-11 – 2023-05-15 (×6): 10 mg via ORAL
  Filled 2023-05-11 (×6): qty 1

## 2023-05-11 MED ORDER — TAMSULOSIN HCL 0.4 MG PO CAPS
0.4000 mg | ORAL_CAPSULE | Freq: Every day | ORAL | Status: DC
Start: 2023-05-11 — End: 2023-05-16
  Administered 2023-05-12 – 2023-05-16 (×5): 0.4 mg via ORAL
  Filled 2023-05-11 (×6): qty 1

## 2023-05-11 MED ORDER — METOPROLOL TARTRATE 5 MG/5ML IV SOLN
2.5000 mg | Freq: Once | INTRAVENOUS | Status: AC
  Administered 2023-05-11: 2.5 mg via INTRAVENOUS
  Filled 2023-05-11: qty 5

## 2023-05-11 MED ORDER — LEVOTHYROXINE SODIUM 50 MCG PO TABS
75.0000 ug | ORAL_TABLET | Freq: Every day | ORAL | Status: DC
Start: 2023-05-11 — End: 2023-05-13
  Administered 2023-05-11 – 2023-05-13 (×3): 75 ug via ORAL
  Filled 2023-05-11 (×2): qty 1
  Filled 2023-05-11: qty 2

## 2023-05-11 NOTE — ED Notes (Signed)
 HR going up to 140s-150s and breathing is labored. Pt states he is just "feeling bad". Contacted hospitalist regarding pt respiratory status and HR. Monitor reading a fib.

## 2023-05-11 NOTE — ED Notes (Signed)
 Provided ice water . Family member of pt complaining that this place is run like a zoo and someone should have come and told us  when his wife was sent to [place]. Pt wife was also here for flu and copd exascerbation and was sent to [facility] because no available beds here.

## 2023-05-11 NOTE — ED Notes (Signed)
 Messaged pharmacy for hydroxyzine.

## 2023-05-11 NOTE — ED Notes (Signed)
 Pt looks MUCH BETTER on bipap. Resting comfortably, at ease. Tolerating bipap perfectly.

## 2023-05-11 NOTE — ED Notes (Signed)
 Pt is sleeping soundly. Will give duoneb with 10am meds. Sister at bedside.

## 2023-05-11 NOTE — ED Notes (Signed)
 Hospitalist on way to come assess pt due to worsening status.

## 2023-05-11 NOTE — ED Notes (Signed)
 Pt was removed from bipap by RT and is on 3L. Told pt to call if feels worse. Pt appears much less labored than earlier before he went on bipap.

## 2023-05-11 NOTE — ED Notes (Signed)
 Pt requesting "an antacid". Messaged provider.

## 2023-05-11 NOTE — ED Notes (Signed)
 Per Dr Rennis Chris, can hold all non essential PO meds and pt is now NPO. RT on way to place bipap.

## 2023-05-11 NOTE — ED Notes (Signed)
 Came in to room, pt laying over on side rail, oxygen sats 77% on 3 liters. Pt sat up, oxygen increased to 4 liters 96%

## 2023-05-11 NOTE — ED Notes (Signed)
 Dr Rennis Chris at bedside.

## 2023-05-11 NOTE — Progress Notes (Signed)
 PROGRESS NOTE    Benjamin Torres  FMW:[id number] DOB: 06/16/1955 DOA: 05/10/2023 PCP: Cleotilde Oneil FALCON, MD  Chief Complaint  Patient presents with   Shortness of Breath    Hospital Course:  Benjamin Torres is 68 y.o. 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  Patient was found to have influenza A.  He was hypoxic with increased work of breathing and initially started on nasal cannula oxygen.  Chest x-ray negative, CTA negative for PE.  Patient was admitted.  Subjective: On evaluation this morning patient has significant work of breathing, and audible expiratory wheeze.  His heart rate is Afib RVR 140s to 150s.  He has just finished breathing treatment. 2.5 mg IV push metoprolol  was given and heart rate resolved to the 90s.  Patient calmed some.  He was also given home dose Xanax . Given his increased work of breathing I asked respiratory therapy to place him on BiPAP.  Patient tolerated BiPAP well and had significant improvement.  He became much less labored.  3 hours on BiPAP he was transition back to nasal cannula and continued breathing easily.  Patient's cousin is at bedside.  Patient's wife is also currently hospitalized with influenza.  At the time my evaluation she is in the ED.   Objective: Vitals:   05/11/23 0938 05/11/23 1100 05/11/23 1130 05/11/23 1158  BP:  118/81 113/79   Pulse: 99 86 82 88  Resp: (!) 25 (!) 25 (!) 24 19  Temp:      TempSrc:      SpO2: 92% 100% 100% 98%  Weight:      Height:        Intake/Output Summary (Last 24 hours) at 05/11/2023 1215 Last data filed at 05/10/2023 2215 Gross per 24 hour  Intake 50 ml  Output --  Net 50 ml   Filed  Weights   05/10/23 2344  Weight: 70.3 kg    Examination: General exam: In distress, tachypneic, leaning forward Respiratory system: Increased work of breathing, retractions, end expiratory wheeze audible without stethoscope Cardiovascular system: Irregular rhythm Gastrointestinal system: Abdomen is nondistended, soft and nontender.  Neuro: Patient is drowsy, but arousable Extremities: Symmetric, expected ROM Skin: No rashes, lesions Psychiatry: Mood & affect appropriate for situation.   Assessment & Plan:  Principal Problem:   COPD exacerbation (HCC) Active Problems:   Influenza A   Chronic systolic CHF (congestive heart failure) (HCC)   CAD (coronary artery disease)   Myocardial injury   Acquired hypothyroidism   Hyperlipidemia   HTN (hypertension)   Tobacco use    COPD exacerbation due to influenza A Acute hypoxic respiratory failure - Necessitated BiPAP for 3 hours this morning due to significant work of breathing, has since been tapered back to 3 L O2 and doing much better - Continue with DuoNebs, patient may benefit from continuous albuterol  however given his A-fib RVR we will proceed with every 4 DuoNebs for now and monitor closely - Status post 2 g mag in ED - Status post Solu-Medrol  125, currently on 80 daily, will increase to 40 mg every 8h - Continue Tamiflu  70 mg twice daily - Mucinex  as needed - Encourage incentive spirometry - Sputum culture sent, follow - Follow blood cultures  A-fib RVR - Does  not appear patient has history of A-fib outpatient, though he was also noted to be in A-fib RVR during COPD exacerbation 2022 ED visit -- EKG on arrival was NSR - A-fib RVR appreciated on telemetry with increased work of breathing episode -- Will repeat EKG now that patient has improved significantly. - Suspect that this is provoked by his current hypoxia and COPD exacerbation - RVR resolved with one-time push of metoprolol  - Continue home dose metoprolol  succ for  now - Continue to monitor on telemetry CHA2DS2-VASc: 4, would likely benefit from anticoagulation if atrial fibrillation persists. - Will obtain echocardiogram this admission  Chronic systolic heart failure - Echocardiogram March 2024: EF 45 to 50%, patient is clinically euvolemic at this time.  No Pulm edema on chest x-ray. - Does not appear to be in exacerbation - Continue with GDMT as BP tolerates  History of CAD - Troponin elevation - Trop elevation on arrival 13 -> 21 -> 34, and is without chest pain.   - Likely secondary to demand ischemia given his hypoxia and RVR as above - Continue aspirin  - Continue statin - Lipids at goal, LDL 26. - Hemoglobin A1c 5.5% -- Echo ordered  Acquired hypothyroidism - Continue home dose Synthroid   Hyperlipidemia - Continue statin and Zetia   Hypertension - Currently on metoprolol  and Cozaar  at home, will continue these for now  Current tobacco abuse - Nicotine  patch - Has been counseled on cessation  DVT prophylaxis: lovenox    Code Status: Full Code Family Communication: Cousin at bedside. Wife currently hospitalized Disposition:  Status is: Observation     Consultants:      Procedures:    Antimicrobials:  Anti-infectives (From admission, onward)    Start     Dose/Rate Route Frequency Ordered Stop   05/12/23 1000  azithromycin  (ZITHROMAX ) tablet 250 mg  Status:  Discontinued       Placed in Followed by Linked Group   250 mg Oral Daily 05/10/23 2320 05/10/23 2345   05/11/23 1000  azithromycin  (ZITHROMAX ) tablet 500 mg  Status:  Discontinued       Placed in Followed by Linked Group   500 mg Oral Daily 05/10/23 2320 05/10/23 2345   05/11/23 0015  oseltamivir  (TAMIFLU ) capsule 75 mg        75 mg Oral 2 times daily 05/11/23 0006 05/15/23 2159       Data Reviewed: I have personally reviewed following labs and imaging studies CBC: Recent Labs  Lab 05/10/23 1856  WBC 11.5*  HGB 16.8  HCT 51.7  MCV 90.1  PLT 180    Basic Metabolic Panel: Recent Labs  Lab 05/10/23 2106  NA 136  K 4.5  CL 97*  CO2 25  GLUCOSE 112*  BUN 13  CREATININE 0.92  CALCIUM  9.3   GFR: Estimated Creatinine Clearance: 69.3 mL/min (by C-G formula based on SCr of 0.92 mg/dL). Liver Function Tests: No results for input(s): AST, ALT, ALKPHOS, BILITOT, PROT, ALBUMIN in the last 168 hours. CBG: No results for input(s): GLUCAP in the last 168 hours.  Recent Results (from the past 240 hours)  Resp panel by RT-PCR (RSV, Flu A&B, Covid) Anterior Nasal Swab     Status: Abnormal   Collection Time: 05/10/23  8:54 PM   Specimen: Anterior Nasal Swab  Result Value Ref Range Status   SARS Coronavirus 2 by RT PCR NEGATIVE NEGATIVE Final    Comment: (NOTE) SARS-CoV-2 target nucleic acids are NOT DETECTED.  The SARS-CoV-2 RNA is generally detectable in upper  respiratory specimens during the acute phase of infection. The lowest concentration of SARS-CoV-2 viral copies this assay can detect is 138 copies/mL. A negative result does not preclude SARS-Cov-2 infection and should not be used as the sole basis for treatment or other patient management decisions. A negative result may occur with  improper specimen collection/handling, submission of specimen other than nasopharyngeal swab, presence of viral mutation(s) within the areas targeted by this assay, and inadequate number of viral copies(<138 copies/mL). A negative result must be combined with clinical observations, patient history, and epidemiological information. The expected result is Negative.  Fact Sheet for Patients:  bloggercourse.com  Fact Sheet for Healthcare Providers:  seriousbroker.it  This test is no t yet approved or cleared by the [place]  FDA and  has been authorized for detection and/or diagnosis of SARS-CoV-2 by FDA under an Emergency Use Authorization (EUA). This EUA will remain  in  effect (meaning this test can be used) for the duration of the COVID-19 declaration under Section 564(b)(1) of the Act, 21 U.S.C.section 360bbb-3(b)(1), unless the authorization is terminated  or revoked sooner.       Influenza A by PCR POSITIVE (A) NEGATIVE Final   Influenza B by PCR NEGATIVE NEGATIVE Final    Comment: (NOTE) The Xpert Xpress SARS-CoV-2/FLU/RSV plus assay is intended as an aid in the diagnosis of influenza from Nasopharyngeal swab specimens and should not be used as a sole basis for treatment. Nasal washings and aspirates are unacceptable for Xpert Xpress SARS-CoV-2/FLU/RSV testing.  Fact Sheet for Patients: bloggercourse.com  Fact Sheet for Healthcare Providers: seriousbroker.it  This test is not yet approved or cleared by the [place]  FDA and has been authorized for detection and/or diagnosis of SARS-CoV-2 by FDA under an Emergency Use Authorization (EUA). This EUA will remain in effect (meaning this test can be used) for the duration of the COVID-19 declaration under Section 564(b)(1) of the Act, 21 U.S.C. section 360bbb-3(b)(1), unless the authorization is terminated or revoked.     Resp Syncytial Virus by PCR NEGATIVE NEGATIVE Final    Comment: (NOTE) Fact Sheet for Patients: bloggercourse.com  Fact Sheet for Healthcare Providers: seriousbroker.it  This test is not yet approved or cleared by the [place]  FDA and has been authorized for detection and/or diagnosis of SARS-CoV-2 by FDA under an Emergency Use Authorization (EUA). This EUA will remain in effect (meaning this test can be used) for the duration of the COVID-19 declaration under Section 564(b)(1) of the Act, 21 U.S.C. section 360bbb-3(b)(1), unless the authorization is terminated or revoked.  Performed at [facility], [address] [place], [place] [address]       Radiology Studies: CT Angio Chest PE W and/or Wo Contrast Result Date: 05/10/2023 CLINICAL DATA:  Shortness of breath EXAM: CT ANGIOGRAPHY CHEST WITH CONTRAST TECHNIQUE: Multidetector CT imaging of the chest was performed using the standard protocol during bolus administration of intravenous contrast. Multiplanar CT image reconstructions and MIPs were obtained to evaluate the vascular anatomy. RADIATION DOSE REDUCTION: This exam was performed according to the departmental dose-optimization program which includes automated exposure control, adjustment of the mA and/or kV according to patient size and/or use of iterative reconstruction technique. CONTRAST:  80mL OMNIPAQUE  IOHEXOL  350 MG/ML SOLN COMPARISON:  Chest radiograph dated 05/10/2023. CTA chest dated 01/02/2023. FINDINGS: Cardiovascular: Satisfactory opacification of the bilateral pulmonary arteries to the segmental level. No evidence of pulmonary embolism Although not tailored for evaluation of the thoracic aorta, there is no evidence of thoracic aortic aneurysm or  dissection. Mild atherosclerotic calcifications of the aortic arch. Moderate three-vessel coronary atherosclerosis. Mediastinum/Nodes: No suspicious mediastinal lymphadenopathy. Visualized thyroid  is unremarkable. Lungs/Pleura: Moderate centrilobular and paraseptal emphysematous changes, upper lung predominant. Mild linear scarring in the right lower lobe. Nodular scarring in the posterior left upper lobe (series 5/image 29), chronic. Additional scattered chronic small subpleural nodules in the lungs bilaterally, measuring up to 5 mm in the left upper lobe (series 5/image 85), benign. No follow-up is recommended per Fleischner Society guidelines. No new/suspicious pulmonary nodules. No pleural effusion pneumothorax. Upper Abdomen: No visualized upper abdomen is notable for a small hiatal hernia, cholelithiasis, pole renal calculus, and vascular calcifications. A 3 mm nonobstructing left lower  Musculoskeletal: Mild degenerative changes of the upper lumbar spine. Review of the MIP images confirms the above findings. IMPRESSION: No evidence of pulmonary embolism. No acute cardiopulmonary disease. Aortic Atherosclerosis (ICD10-I70.0) and Emphysema (ICD10-J43.9). Electronically Signed   By: Pinkie Pebbles M.D.   On: 05/10/2023 22:31   DG Chest Port 1 View Result Date: 05/10/2023 CLINICAL DATA:  Shortness of breath EXAM: PORTABLE CHEST 1 VIEW COMPARISON:  CTA chest dated 01/02/2023 FINDINGS: Lungs are clear.  No pleural effusion or pneumothorax. The heart is normal in size. IMPRESSION: No acute cardiopulmonary disease. Electronically Signed   By: Pinkie Pebbles M.D.   On: 05/10/2023 19:49    Scheduled Meds:  ALPRAZolam   0.5 mg Oral BID   aspirin  EC  81 mg Oral Daily   enoxaparin  (LOVENOX ) injection  40 mg Subcutaneous Q24H   ezetimibe   10 mg Oral Daily   gabapentin   100 mg Oral Daily   ipratropium-albuterol   3 mL Nebulization Q4H   levothyroxine   75 mcg Oral Q0600   losartan   50 mg Oral Daily   methylPREDNISolone  (SOLU-MEDROL ) injection  80 mg Intravenous Daily   metoprolol  succinate  25 mg Oral Daily   montelukast   10 mg Oral Daily   nicotine   21 mg Transdermal Daily   oseltamivir   75 mg Oral BID   rosuvastatin   10 mg Oral QHS   tamsulosin   0.4 mg Oral Daily   Continuous Infusions:   LOS: 0 days    Time spent:  55min  Aikeem Lilley, DO [facility]  To contact the attending physician between 7A-7P please use Epic Chat. To contact the covering physician during after hours 7P-7A, please review Amion.   05/11/2023, 12:15 PM   *This document has been created with the assistance of dictation software. Please excuse typographical errors. *

## 2023-05-11 NOTE — ED Notes (Signed)
 Called RT to come place Bipap.

## 2023-05-11 NOTE — ED Notes (Signed)
 See new order for ativan . Pt was calling on call bell to complain that there was no remote and for how much money I'm paying to be in this hospital, I should have a big screen TV and my wife already called me from the other hospital, she has a remote and a TV and I'm about to get up and walk out of here. This RN went across ED to find a remote and brought it to room but it does not work and TV button does not work to change channel manually. Family member was also complaining that someone in his family died here 20 years ago.

## 2023-05-11 NOTE — ED Notes (Signed)
 Pt awake now, complains of labored respirations. Pt does appear more labored than when he was sleeping. Duoneb given at this time.

## 2023-05-12 ENCOUNTER — Inpatient Hospital Stay (HOSPITAL_COMMUNITY)
Admit: 2023-05-12 | Discharge: 2023-05-12 | Disposition: A | Discharge status: Home / Self Care | Payer: Medicare HMO | Attending: Family Medicine | Admitting: Family Medicine

## 2023-05-12 DIAGNOSIS — I480 Paroxysmal atrial fibrillation: Secondary | ICD-10-CM | POA: Diagnosis not present

## 2023-05-12 DIAGNOSIS — J441 Chronic obstructive pulmonary disease with (acute) exacerbation: Secondary | ICD-10-CM | POA: Diagnosis not present

## 2023-05-12 MED ORDER — PERFLUTREN LIPID MICROSPHERE
1.0000 mL | INTRAVENOUS | Status: AC | PRN
  Administered 2023-05-12: 5 mL via INTRAVENOUS

## 2023-05-12 NOTE — ED Notes (Signed)
 Cherly Hensen (Sister) [phone]  Marissa Lowrey (Brother) [phone]

## 2023-05-12 NOTE — Progress Notes (Signed)
 PROGRESS NOTE    Benjamin Torres  FMW:[id number] DOB: [birth year] DOA: 05/10/2023 PCP: Cleotilde Oneil FALCON, MD  Chief Complaint  Patient presents with   Shortness of Breath    Hospital Course:  Benjamin Torres is 68 y.o. 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  Patient was found to have influenza A.  He was hypoxic with increased work of breathing and initially started on nasal cannula oxygen.  Chest x-ray negative, CTA negative for PE.  Patient was admitted.  Subjective: Earlier this morning patient was having increased work of breathing and required BiPAP again.  Was later able to be transitioned back to nasal cannula.   Objective: Vitals:   05/12/23 1030 05/12/23 1111 05/12/23 1330 05/12/23 1530  BP: 126/86 (!) 130/102 101/71 (!) 83/67  Pulse: 98 100 86   Resp: (!) 21  (!) 23 (!) 21  Temp:  (!) 97.5 F (36.4 C)    TempSrc:  Oral    SpO2: 99%  100%   Weight:      Height:       No intake or output data in the 24 hours ending 05/12/23 1646  Filed Weights   05/10/23 2344  Weight: 70.3 kg    Examination: General exam: In distress, tachypneic, leaning forward Respiratory system: breathing comfortably on BiPAP, diffuse wheezing bilaterally, aeration bilaterally Cardiovascular system: RRR Gastrointestinal system: Abdomen is nondistended, soft and nontender.  Neuro: Patient is drowsy, but arousable Extremities: Symmetric, expected ROM Skin: No rashes, lesions Psychiatry: Mood & affect appropriate for situation.   Assessment & Plan:  Principal Problem:   COPD exacerbation (HCC) Active Problems:   Influenza A   Chronic systolic CHF (congestive heart  failure) (HCC)   CAD (coronary artery disease)   Myocardial injury   Acquired hypothyroidism   Hyperlipidemia   HTN (hypertension)   Tobacco use   COPD with acute exacerbation (HCC)    COPD exacerbation due to influenza A Acute hypoxic respiratory failure - Appears to be necessitating occasional BiPAP due to work of breathing.  Stable on nasal cannula now. - Continue with DuoNebs, patient may benefit from continuous albuterol  however given his A-fib RVR we will proceed with every 4 DuoNebs for now and monitor closely - Status post 2 g mag in ED - Status post Solu-Medrol  125, cont 40 mg every 8h - Continue Tamiflu  70 mg twice daily - Mucinex  as needed - Encourage incentive spirometry - Sputum culture sent, follow - Follow blood cultures  A-fib RVR Resolved NSR this morning. - Does not appear patient has history of A-fib outpatient, though he was also noted to be in A-fib RVR during COPD exacerbation 2022 ED visit -- EKG on arrival was NSR - A-fib RVR appreciated on telemetry with increased work of breathing episode - Suspect that this is provoked by his current hypoxia and COPD exacerbation - RVR resolved with one-time push of metoprolol  - Continue home dose metoprolol  succ for now - Continue to monitor on telemetry CHA2DS2-VASc: 4, would likely benefit from anticoagulation if atrial fibrillation persists. - Will obtain echocardiogram this admission, pending read  Chronic systolic heart failure - Echocardiogram March 2024: EF 45 to 50%, patient is clinically euvolemic at this time.  No Pulm edema on chest x-ray. -  Does not appear to be in exacerbation - Continue with GDMT as BP tolerates  History of CAD - Troponin elevation - Trop elevation on arrival 13 -> 21 -> 34, and is without chest pain.   - Likely secondary to demand ischemia given his hypoxia and RVR as above - Continue aspirin  - Continue statin - Lipids at goal, LDL 26. - Hemoglobin A1c 5.5% -- Echo  ordered  Acquired hypothyroidism - Continue home dose Synthroid   Hyperlipidemia - Continue statin and Zetia   Hypertension - Currently on metoprolol  and Cozaar  at home, will continue these for now  Current tobacco abuse - Nicotine  patch - Has been counseled on cessation  DVT prophylaxis: lovenox    Code Status: Full Code Family Communication: Discussed directly with patient Disposition:  Status is: Observation     Consultants:      Procedures:    Antimicrobials:  Anti-infectives (From admission, onward)    Start     Dose/Rate Route Frequency Ordered Stop   05/12/23 1000  azithromycin  (ZITHROMAX ) tablet 250 mg  Status:  Discontinued       Placed in Followed by Linked Group   250 mg Oral Daily 05/10/23 2320 05/10/23 2345   05/11/23 1000  azithromycin  (ZITHROMAX ) tablet 500 mg  Status:  Discontinued       Placed in Followed by Linked Group   500 mg Oral Daily 05/10/23 2320 05/10/23 2345   05/11/23 0015  oseltamivir  (TAMIFLU ) capsule 75 mg        75 mg Oral 2 times daily 05/11/23 0006 05/15/23 2159       Data Reviewed: I have personally reviewed following labs and imaging studies CBC: Recent Labs  Lab 05/10/23 1856  WBC 11.5*  HGB 16.8  HCT 51.7  MCV 90.1  PLT 180   Basic Metabolic Panel: Recent Labs  Lab 05/10/23 2106  NA 136  K 4.5  CL 97*  CO2 25  GLUCOSE 112*  BUN 13  CREATININE 0.92  CALCIUM  9.3   GFR: Estimated Creatinine Clearance: 69.3 mL/min (by C-G formula based on SCr of 0.92 mg/dL). Liver Function Tests: No results for input(s): AST, ALT, ALKPHOS, BILITOT, PROT, ALBUMIN in the last 168 hours. CBG: No results for input(s): GLUCAP in the last 168 hours.  Recent Results (from the past 240 hours)  Resp panel by RT-PCR (RSV, Flu A&B, Covid) Anterior Nasal Swab     Status: Abnormal   Collection Time: 05/10/23  8:54 PM   Specimen: Anterior Nasal Swab  Result Value Ref Range Status   SARS Coronavirus 2 by RT PCR NEGATIVE  NEGATIVE Final    Comment: (NOTE) SARS-CoV-2 target nucleic acids are NOT DETECTED.  The SARS-CoV-2 RNA is generally detectable in upper respiratory specimens during the acute phase of infection. The lowest concentration of SARS-CoV-2 viral copies this assay can detect is 138 copies/mL. A negative result does not preclude SARS-Cov-2 infection and should not be used as the sole basis for treatment or other patient management decisions. A negative result may occur with  improper specimen collection/handling, submission of specimen other than nasopharyngeal swab, presence of viral mutation(s) within the areas targeted by this assay, and inadequate number of viral copies(<138 copies/mL). A negative result must be combined with clinical observations, patient history, and epidemiological information. The expected result is Negative.  Fact Sheet for Patients:  bloggercourse.com  Fact Sheet for Healthcare Providers:  seriousbroker.it  This test is no t yet approved or cleared by the [place]  FDA and  has been  authorized for detection and/or diagnosis of SARS-CoV-2 by FDA under an Emergency Use Authorization (EUA). This EUA will remain  in effect (meaning this test can be used) for the duration of the COVID-19 declaration under Section 564(b)(1) of the Act, 21 U.S.C.section 360bbb-3(b)(1), unless the authorization is terminated  or revoked sooner.       Influenza A by PCR POSITIVE (A) NEGATIVE Final   Influenza B by PCR NEGATIVE NEGATIVE Final    Comment: (NOTE) The Xpert Xpress SARS-CoV-2/FLU/RSV plus assay is intended as an aid in the diagnosis of influenza from Nasopharyngeal swab specimens and should not be used as a sole basis for treatment. Nasal washings and aspirates are unacceptable for Xpert Xpress SARS-CoV-2/FLU/RSV testing.  Fact Sheet for Patients: bloggercourse.com  Fact Sheet for  Healthcare Providers: seriousbroker.it  This test is not yet approved or cleared by the [place]  FDA and has been authorized for detection and/or diagnosis of SARS-CoV-2 by FDA under an Emergency Use Authorization (EUA). This EUA will remain in effect (meaning this test can be used) for the duration of the COVID-19 declaration under Section 564(b)(1) of the Act, 21 U.S.C. section 360bbb-3(b)(1), unless the authorization is terminated or revoked.     Resp Syncytial Virus by PCR NEGATIVE NEGATIVE Final    Comment: (NOTE) Fact Sheet for Patients: bloggercourse.com  Fact Sheet for Healthcare Providers: seriousbroker.it  This test is not yet approved or cleared by the [place]  FDA and has been authorized for detection and/or diagnosis of SARS-CoV-2 by FDA under an Emergency Use Authorization (EUA). This EUA will remain in effect (meaning this test can be used) for the duration of the COVID-19 declaration under Section 564(b)(1) of the Act, 21 U.S.C. section 360bbb-3(b)(1), unless the authorization is terminated or revoked.  Performed at [facility], [address] [place], [place] [address]      Radiology Studies: CT Angio Chest PE W and/or Wo Contrast Result Date: 05/10/2023 CLINICAL DATA:  Shortness of breath EXAM: CT ANGIOGRAPHY CHEST WITH CONTRAST TECHNIQUE: Multidetector CT imaging of the chest was performed using the standard protocol during bolus administration of intravenous contrast. Multiplanar CT image reconstructions and MIPs were obtained to evaluate the vascular anatomy. RADIATION DOSE REDUCTION: This exam was performed according to the departmental dose-optimization program which includes automated exposure control, adjustment of the mA and/or kV according to patient size and/or use of iterative reconstruction technique. CONTRAST:  80mL OMNIPAQUE  IOHEXOL  350 MG/ML SOLN  COMPARISON:  Chest radiograph dated 05/10/2023. CTA chest dated 01/02/2023. FINDINGS: Cardiovascular: Satisfactory opacification of the bilateral pulmonary arteries to the segmental level. No evidence of pulmonary embolism Although not tailored for evaluation of the thoracic aorta, there is no evidence of thoracic aortic aneurysm or dissection. Mild atherosclerotic calcifications of the aortic arch. Moderate three-vessel coronary atherosclerosis. Mediastinum/Nodes: No suspicious mediastinal lymphadenopathy. Visualized thyroid  is unremarkable. Lungs/Pleura: Moderate centrilobular and paraseptal emphysematous changes, upper lung predominant. Mild linear scarring in the right lower lobe. Nodular scarring in the posterior left upper lobe (series 5/image 29), chronic. Additional scattered chronic small subpleural nodules in the lungs bilaterally, measuring up to 5 mm in the left upper lobe (series 5/image 85), benign. No follow-up is recommended per Fleischner Society guidelines. No new/suspicious pulmonary nodules. No pleural effusion pneumothorax. Upper Abdomen: No visualized upper abdomen is notable for a small hiatal hernia, cholelithiasis, pole renal calculus, and vascular calcifications. A 3 mm nonobstructing left lower Musculoskeletal: Mild degenerative changes of the upper lumbar spine. Review of the MIP images confirms the above  findings. IMPRESSION: No evidence of pulmonary embolism. No acute cardiopulmonary disease. Aortic Atherosclerosis (ICD10-I70.0) and Emphysema (ICD10-J43.9). Electronically Signed   By: Pinkie Pebbles M.D.   On: 05/10/2023 22:31   DG Chest Port 1 View Result Date: 05/10/2023 CLINICAL DATA:  Shortness of breath EXAM: PORTABLE CHEST 1 VIEW COMPARISON:  CTA chest dated 01/02/2023 FINDINGS: Lungs are clear.  No pleural effusion or pneumothorax. The heart is normal in size. IMPRESSION: No acute cardiopulmonary disease. Electronically Signed   By: Pinkie Pebbles M.D.   On: 05/10/2023  19:49    Scheduled Meds:  ALPRAZolam   0.5 mg Oral BID   aspirin  EC  81 mg Oral Daily   enoxaparin  (LOVENOX ) injection  40 mg Subcutaneous Q24H   ezetimibe   10 mg Oral Daily   gabapentin   100 mg Oral Daily   ipratropium-albuterol   3 mL Nebulization Q4H   levothyroxine   75 mcg Oral Q0600   losartan   50 mg Oral Daily   methylPREDNISolone  (SOLU-MEDROL ) injection  40 mg Intravenous Q8H   metoprolol  succinate  25 mg Oral Daily   montelukast   10 mg Oral Daily   nicotine   21 mg Transdermal Daily   oseltamivir   75 mg Oral BID   rosuvastatin   10 mg Oral QHS   tamsulosin   0.4 mg Oral Daily   Continuous Infusions:   LOS: 1 day    Time spent:  55min  Gaylen Pereira, DO [facility]  To contact the attending physician between 7A-7P please use Epic Chat. To contact the covering physician during after hours 7P-7A, please review Amion.   05/12/2023, 4:46 PM   *This document has been created with the assistance of dictation software. Please excuse typographical errors. *

## 2023-05-12 NOTE — Progress Notes (Signed)
*  PRELIMINARY RESULTS* Echocardiogram 2D Echocardiogram has been performed.  Carolyne Fiscal 05/12/2023, 4:24 PM

## 2023-05-12 NOTE — ED Notes (Signed)
 RN called to room by echocardiogram tech as patient did not have any IV s for her to use to complete his test. IV found laying in bed under left arm. Unsure of where right arm IV location. New IV placed and secured.

## 2023-05-13 DIAGNOSIS — I4891 Unspecified atrial fibrillation: Secondary | ICD-10-CM | POA: Diagnosis not present

## 2023-05-13 DIAGNOSIS — J441 Chronic obstructive pulmonary disease with (acute) exacerbation: Secondary | ICD-10-CM | POA: Diagnosis not present

## 2023-05-13 DIAGNOSIS — I480 Paroxysmal atrial fibrillation: Secondary | ICD-10-CM

## 2023-05-13 LAB — ECHOCARDIOGRAM COMPLETE
Area-P 1/2: 7.9 cm2
Height: 66 in
MV VTI: 5.76 cm2
S' Lateral: 3 cm
Weight: 2480 [oz_av]

## 2023-05-13 LAB — COMPREHENSIVE METABOLIC PANEL
ALT: 25 U/L (ref 0–44)
AST: 26 U/L (ref 15–41)
Albumin: 3.1 g/dL — ABNORMAL LOW (ref 3.5–5.0)
Alkaline Phosphatase: 32 U/L — ABNORMAL LOW (ref 38–126)
Anion gap: 7 (ref 5–15)
BUN: 37 mg/dL — ABNORMAL HIGH (ref 8–23)
CO2: 28 mmol/L (ref 22–32)
Calcium: 8.7 mg/dL — ABNORMAL LOW (ref 8.9–10.3)
Chloride: 103 mmol/L (ref 98–111)
Creatinine, Ser: 0.93 mg/dL (ref 0.61–1.24)
GFR, Estimated: >60 mL/min (ref >60)
Glucose, Bld: 148 mg/dL — ABNORMAL HIGH (ref 70–99)
Potassium: 4.7 mmol/L (ref 3.5–5.1)
Sodium: 138 mmol/L (ref 135–145)
Total Bilirubin: 0.5 mg/dL (ref 0.0–1.2)
Total Protein: 6 g/dL — ABNORMAL LOW (ref 6.5–8.1)

## 2023-05-13 LAB — CBC WITH DIFFERENTIAL/PLATELET
Abs Immature Granulocytes: 0.13 10*3/uL — ABNORMAL HIGH (ref 0.00–0.07)
Basophils Absolute: 0 10*3/uL (ref 0.0–0.1)
Basophils Relative: 0 %
Eosinophils Absolute: 0 10*3/uL (ref 0.0–0.5)
Eosinophils Relative: 0 %
HCT: 46.6 % (ref 39.0–52.0)
Hemoglobin: 15.2 g/dL (ref 13.0–17.0)
Immature Granulocytes: 1 %
Lymphocytes Relative: 4 %
Lymphs Abs: 0.7 10*3/uL (ref 0.7–4.0)
MCH: 29.3 pg (ref 26.0–34.0)
MCHC: 32.6 g/dL (ref 30.0–36.0)
MCV: 89.8 fL (ref 80.0–100.0)
Monocytes Absolute: 0.9 10*3/uL (ref 0.1–1.0)
Monocytes Relative: 5 %
Neutro Abs: 14.8 10*3/uL — ABNORMAL HIGH (ref 1.7–7.7)
Neutrophils Relative %: 90 %
Platelets: 184 10*3/uL (ref 150–400)
RBC: 5.19 MIL/uL (ref 4.22–5.81)
RDW: 13.2 % (ref 11.5–15.5)
WBC: 16.5 10*3/uL — ABNORMAL HIGH (ref 4.0–10.5)
nRBC: 0 % (ref 0.0–0.2)

## 2023-05-13 LAB — MAGNESIUM: Magnesium: 2.3 mg/dL (ref 1.7–2.4)

## 2023-05-13 LAB — PHOSPHORUS: Phosphorus: 4.2 mg/dL (ref 2.5–4.6)

## 2023-05-13 LAB — TROPONIN I (HIGH SENSITIVITY): Troponin I (High Sensitivity): 17 ng/L (ref <18)

## 2023-05-13 MED ORDER — LEVOTHYROXINE SODIUM 100 MCG PO TABS
100.0000 ug | ORAL_TABLET | Freq: Every day | ORAL | Status: DC
  Administered 2023-05-14 – 2023-05-16 (×3): 100 ug via ORAL
  Filled 2023-05-13 (×3): qty 1

## 2023-05-13 MED ORDER — APIXABAN 5 MG PO TABS
5.0000 mg | ORAL_TABLET | Freq: Two times a day (BID) | ORAL | Status: DC
  Administered 2023-05-13 – 2023-05-16 (×6): 5 mg via ORAL
  Filled 2023-05-13 (×6): qty 1

## 2023-05-13 NOTE — Plan of Care (Signed)
  Problem: Education: Goal: Knowledge of General Education information will improve Description: Including pain rating scale, medication(s)/side effects and non-pharmacologic comfort measures 05/13/2023 0332 by Ann Axel HERO, RN Outcome: Progressing 05/13/2023 0317 by Ann Axel HERO, RN Outcome: Progressing   Problem: Health Behavior/Discharge Planning: Goal: Ability to manage health-related needs will improve 05/13/2023 0332 by Ann Axel HERO, RN Outcome: Progressing 05/13/2023 0317 by Ann Axel HERO, RN Outcome: Progressing   Problem: Clinical Measurements: Goal: Ability to maintain clinical measurements within normal limits will improve 05/13/2023 0332 by Ann Axel HERO, RN Outcome: Progressing 05/13/2023 0317 by Ann Axel HERO, RN Outcome: Progressing

## 2023-05-13 NOTE — Consult Note (Signed)
 Cardiology Consultation   Patient ID: [name] MRN: [id number]; DOB: 1955/08/30  Admit date: 05/10/2023 Date of Consult: 05/13/2023  PCP:  Cleotilde Oneil FALCON, MD   [facility] Providers Cardiologist:  None        Patient Profile:   Benjamin Torres is a 68 y.o. 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 Leesa.  History of Present Illness:   [name] was hospitalized at [facility] from 07/2020 for sepsis secondary to CAP and COPD exacerbation. At that time he was found to be in atrial fibrillation with RVR presumed to be secondary to acute illness which converted spontaneously to sinus rhythm.   Previously a patient of Dr. Bosie who now follows with Dr. Gollan for CAD with stable angina, cardiomyopathy, and hyperlipidemia. He was most recently seen in clinic for routine follow up on 02/10/2023. At that time he had recently been sick with PNA and endorsed shortness of breath. He denied chest pain. He continued to smoke cigarettes but was trying to transition to patches. Zetia  was added.   Patient reports that he experienced productive cough and shortness of breath for 3 days prior to reporting to the emergency department on 1/11. He has associated fatigue and chills. He does endorse feeling as if his heart is racing when he is upset. He denies chest pain, diaphoresis, nausea, vomiting, lightheadedness, and dizziness. He denies orthopnea, PND, and lower extremity edema. His wife is also sick with flu-like symptoms. He does not use supplemental O2 at home.   In the ED, BP 164/111, HR 110, RR 28, T 98.43F, SpO2 93%. Pertinent labs include WBC 11.5. BMP largely unremarkable. Troponin 13>21. Positive for influenza A.  EKG shows sinus tachycardia. CXR negative and CTA without PE. Patient was given Duoneb, Solu-Medrol , and magnesium  sulfate in the ED. During hospitalization, patient was found to be in atrial fibrillation with RVR, rates 140-150 bpm. Treated with IV push of metoprolol .   Past Medical History:  Diagnosis Date   Cancer of larynx (HCC) 11/06/2014   COPD (chronic obstructive pulmonary disease) (HCC)    Hypothyroidism    PE (pulmonary thromboembolism) (HCC)    right lung, over 10 yrs ago    Past Surgical History:  Procedure Laterality Date   ANAL FISSURE REPAIR     APPENDECTOMY     BIOPSY PHARYNX  2015   Larynx BX   COLONOSCOPY WITH PROPOFOL  N/A 03/07/2017   Procedure: COLONOSCOPY WITH PROPOFOL ;  Surgeon: Jinny Carmine, MD;  Location: [facility];  Service: Endoscopy;  Laterality: N/A;   ESOPHAGOGASTRODUODENOSCOPY (EGD) WITH PROPOFOL  N/A 03/07/2017   Procedure: ESOPHAGOGASTRODUODENOSCOPY (EGD) WITH PROPOFOL ;  Surgeon: Jinny Carmine, MD;  Location: [facility];  Service: Endoscopy;  Laterality: N/A;   LEFT HEART CATH AND CORONARY ANGIOGRAPHY Left 09/23/2019   Procedure: LEFT HEART CATH AND CORONARY ANGIOGRAPHY;  Surgeon: Bosie Vinie LABOR, MD;  Location: [facility] INVASIVE CV [facility];  Service: Cardiovascular;  Laterality: Left;   POLYPECTOMY  03/07/2017   Procedure: POLYPECTOMY;  Surgeon: Jinny Carmine, MD;  Location: [facility];  Service: Endoscopy;;       Inpatient Medications: Scheduled Meds:  ALPRAZolam   0.5 mg Oral BID   aspirin  EC  81 mg Oral Daily   enoxaparin  (LOVENOX ) injection  40 mg  Subcutaneous Q24H   ezetimibe   10 mg Oral Daily   gabapentin   100 mg Oral Daily   ipratropium-albuterol   3 mL Nebulization Q4H   [START ON 05/14/2023] levothyroxine   100 mcg Oral Q0600   losartan   50 mg Oral Daily   methylPREDNISolone  (SOLU-MEDROL ) injection  40 mg Intravenous Q8H   metoprolol  succinate  25 mg Oral Daily   montelukast   10 mg Oral Daily   nicotine   21 mg Transdermal  Daily   oseltamivir   75 mg Oral BID   rosuvastatin   10 mg Oral QHS   tamsulosin   0.4 mg Oral Daily   Continuous Infusions:  PRN Meds: acetaminophen , albuterol , dextromethorphan -guaiFENesin , famotidine , hydrALAZINE , hydrOXYzine , ondansetron  (ZOFRAN ) IV  Allergies:   No Known Allergies  Social History:   Social History   Socioeconomic History   Marital status: Married    Spouse name: Not on file   Number of children: Not on file   Years of education: Not on file   Highest education level: Not on file  Occupational History   Not on file  Tobacco Use   Smoking status: Every Day    Current packs/day: 0.00    Average packs/day: 1.5 packs/day for 40.0 years (60.0 ttl pk-yrs)    Types: E-cigarettes, Cigarettes    Start date: 17    Last attempt to quit: 2015    Years since quitting: 10.0   Smokeless tobacco: Never   Tobacco comments:    Smokes 12-15 cigarettes daily. 06/13/2022.   Vaping Use   Vaping status: Former  Substance and Sexual Activity   Alcohol use: No    Alcohol/week: 0.0 standard drinks of alcohol   Drug use: Never   Sexual activity: Not on file  Other Topics Concern   Not on file  Social History Narrative   Not on file   Social Drivers of Health   Financial Resource Strain: Low Risk  (01/06/2023)   Received from [name]   Overall Financial Resource Strain (CARDIA)    Difficulty of Paying Living Expenses: Not hard at all  Food Insecurity: No Food Insecurity (05/13/2023)   Hunger Vital Sign    Worried About Running Out of Food in the Last Year: Never true    Ran Out of Food in the Last Year: Never true  Transportation Needs: No Transportation Needs (05/13/2023)   PRAPARE - Administrator, Civil Service (Medical): No    Lack of Transportation (Non-Medical): No  Physical Activity: Not on file  Stress: Not on file  Social Connections: Moderately Integrated (05/13/2023)   Social Connection and Isolation Panel [NHANES]     Frequency of Communication with Friends and Family: More than three times a week    Frequency of Social Gatherings with Friends and Family: Once a week    Attends Religious Services: More than 4 times per year    Active Member of [employer] or Organizations: No    Attends Banker Meetings: Never    Marital Status: Married  Catering Manager Violence: Not At Risk (05/13/2023)   Humiliation, Afraid, Rape, and Kick questionnaire    Fear of Current or Ex-Partner: No    Emotionally Abused: No    Physically Abused: No    Sexually Abused: No    Family History:    Family History  Problem Relation Age of Onset   CAD Mother    Cancer - Lung Father      ROS:  Please see the history of present  illness.   Physical Exam/Data:   Vitals:   05/13/23 0052 05/13/23 0058 05/13/23 0500 05/13/23 0914  BP: 118/84   112/81  Pulse: 88   78  Resp: 20   16  Temp: 98.3 F (36.8 C)   97.7 F (36.5 C)  TempSrc: Oral   Oral  SpO2: 92%   98%  Weight:  66 kg 68 kg   Height:  5' 6 (1.676 m)      Intake/Output Summary (Last 24 hours) at 05/13/2023 1432 Last data filed at 05/13/2023 1414 Gross per 24 hour  Intake 240 ml  Output 675 ml  Net -435 ml      05/13/2023    5:00 AM 05/13/2023   12:58 AM 05/10/2023   11:44 PM  Last 3 Weights  Weight (lbs) 149 lb 14.6 oz 145 lb 8.1 oz 155 lb  Weight (kg) 68 kg 66 kg 70.308 kg     Body mass index is 24.2 kg/m.  General:  Well nourished, well developed, in no acute distress HEENT: normal Neck: no JVD Cardiac:  normal S1, S2; RRR; no murmur  Lungs: diminished lung sounds bilaterally, no wheezing, rhonchi or rales  Abd: soft, nontender Ext: no edema Skin: warm and dry  Psych:  Normal affect   EKG:  The EKG was personally reviewed and demonstrates:  Sinus tachycardia, rate 113 Telemetry:  Telemetry was personally reviewed and demonstrates:  Atrial fibrillation rate 80-110 bpm and one run of ventricular bigeminy ~10:30 and conversion to sinus  rhythm @ 1312  Relevant CV Studies:  07/25/2022 TTE 1. Left ventricular ejection fraction, by estimation, is 45 to 50%. Left  ventricular ejection fraction by 3D volume is 49 %. Left ventricular  ejection fraction by 2D MOD biplane is 45.3 %. The left ventricle has  mildly decreased function. The left  ventricle has no regional wall motion abnormalities. There is mild left  ventricular hypertrophy. Left ventricular diastolic parameters are  consistent with Grade I diastolic dysfunction (impaired relaxation).   2. Right ventricular systolic function is normal. The right ventricular  size is normal. There is normal pulmonary artery systolic pressure. The  estimated right ventricular systolic pressure is 34.6 mmHg.   3. The mitral valve is normal in structure. Mild mitral valve  regurgitation. No evidence of mitral stenosis.   4. The aortic valve is tricuspid. Aortic valve regurgitation is not  visualized. No aortic stenosis is present.   5. The inferior vena cava is normal in size with greater than 50%  respiratory variability, suggesting right atrial pressure of 3 mmHg.   09/23/2019 LHC Prox RCA lesion is 100% stenosed. 1st Mrg lesion is 100% stenosed. 2nd Diag lesion is 75% stenosed. LM-normal LAD-insignificant disease LCx-groove circ was without disease, occluded om1 RCA-occluded proximally with collaterals from left EF normal Medical management.    Laboratory Data:  High Sensitivity Troponin:   Recent Labs  Lab 05/10/23 1856 05/10/23 2106 05/11/23 0352 05/13/23 0606  TROPONINIHS 13 21* 34* 17     Chemistry Recent Labs  Lab 05/10/23 2106 05/13/23 0606  NA 136 138  K 4.5 4.7  CL 97* 103  CO2 25 28  GLUCOSE 112* 148*  BUN 13 37*  CREATININE 0.92 0.93  CALCIUM  9.3 8.7*  MG  --  2.3  GFRNONAA >60 >60  ANIONGAP 14 7    Recent Labs  Lab 05/13/23 0606  PROT 6.0*  ALBUMIN 3.1*  AST 26  ALT 25  ALKPHOS 32*  BILITOT 0.5  Lipids  Recent Labs  Lab  05/11/23 0352  CHOL 80  TRIG 45  HDL 45  LDLCALC 26  CHOLHDL 1.8    Hematology Recent Labs  Lab 05/10/23 1856 05/13/23 0606  WBC 11.5* 16.5*  RBC 5.74 5.19  HGB 16.8 15.2  HCT 51.7 46.6  MCV 90.1 89.8  MCH 29.3 29.3  MCHC 32.5 32.6  RDW 13.2 13.2  PLT 180 184   Thyroid  No results for input(s): TSH, FREET4 in the last 168 hours.  BNP Recent Labs  Lab 05/10/23 2335  BNP 137.8*    DDimer No results for input(s): DDIMER in the last 168 hours.   Radiology/Studies:  CT Angio Chest PE W and/or Wo Contrast Result Date: 05/10/2023 CLINICAL DATA:  Shortness of breath EXAM: CT ANGIOGRAPHY CHEST WITH CONTRAST TECHNIQUE: Multidetector CT imaging of the chest was performed using the standard protocol during bolus administration of intravenous contrast. Multiplanar CT image reconstructions and MIPs were obtained to evaluate the vascular anatomy. RADIATION DOSE REDUCTION: This exam was performed according to the departmental dose-optimization program which includes automated exposure control, adjustment of the mA and/or kV according to patient size and/or use of iterative reconstruction technique. CONTRAST:  80mL OMNIPAQUE  IOHEXOL  350 MG/ML SOLN COMPARISON:  Chest radiograph dated 05/10/2023. CTA chest dated 01/02/2023. FINDINGS: Cardiovascular: Satisfactory opacification of the bilateral pulmonary arteries to the segmental level. No evidence of pulmonary embolism Although not tailored for evaluation of the thoracic aorta, there is no evidence of thoracic aortic aneurysm or dissection. Mild atherosclerotic calcifications of the aortic arch. Moderate three-vessel coronary atherosclerosis. Mediastinum/Nodes: No suspicious mediastinal lymphadenopathy. Visualized thyroid  is unremarkable. Lungs/Pleura: Moderate centrilobular and paraseptal emphysematous changes, upper lung predominant. Mild linear scarring in the right lower lobe. Nodular scarring in the posterior left upper lobe (series 5/image  29), chronic. Additional scattered chronic small subpleural nodules in the lungs bilaterally, measuring up to 5 mm in the left upper lobe (series 5/image 85), benign. No follow-up is recommended per Fleischner Society guidelines. No new/suspicious pulmonary nodules. No pleural effusion pneumothorax. Upper Abdomen: No visualized upper abdomen is notable for a small hiatal hernia, cholelithiasis, pole renal calculus, and vascular calcifications. A 3 mm nonobstructing left lower Musculoskeletal: Mild degenerative changes of the upper lumbar spine. Review of the MIP images confirms the above findings. IMPRESSION: No evidence of pulmonary embolism. No acute cardiopulmonary disease. Aortic Atherosclerosis (ICD10-I70.0) and Emphysema (ICD10-J43.9). Electronically Signed   By: Pinkie Pebbles M.D.   On: 05/10/2023 22:31   DG Chest Port 1 View Result Date: 05/10/2023 CLINICAL DATA:  Shortness of breath EXAM: PORTABLE CHEST 1 VIEW COMPARISON:  CTA chest dated 01/02/2023 FINDINGS: Lungs are clear.  No pleural effusion or pneumothorax. The heart is normal in size. IMPRESSION: No acute cardiopulmonary disease. Electronically Signed   By: Pinkie Pebbles M.D.   On: 05/10/2023 19:49     Assessment and Plan:   Paroxysmal atrial fibrillation - Hx of atrial fibrillation during prior hospitalization 07/2020 for COPD exacerbation and PNA. Spontaneously converted to sinus rhythm, no further workup was done. Outpatient EKGs all show sinus rhythm.  - EKG on arrival showed sinus rhythm - Telemetry shows rate controlled atrial fibrillation 80-110 bpm with spontaneous conversion to sinus rhythm @ 1312 this afternoon 1/14 - Suspect secondary to acute respiratory disease - CHA2DS2VASc 4 - Will start Eliquis  5 mg BID - Could consider outpatient Zio monitoring for further insight on need for long term DOAC - Continue PTA metoprolol  succinate 25 mg  COPD exacerbation due to  influenza A Acute hypoxic respiratory  failure Tobacco use - Currently on 4 L supplemental O2 - Management per IM  Chronic systolic heart failure - Echo 09/7973 shows LVEF 45-50% with mild LVH, grade I diastolic dysfunction, and mild mitral valve regurgitation - Patient is euvolemic on exam without acute changes on CXR, do not suspect exacerbation - Recommend continuation of GDMT including losartan  and metoprolol  succinate as BP allows  CAD Elevated troponin - Cath 08/2019 showed chronically occluded proximal RCA lesion with collaterals, a first marginal lesion that was chronically occluded, and a second diagonal lesion that was 75% stenosed recommendations were for medical management  - Troponin peaked at 34, suspect demand ischemia secondary to COPD exacerbation and acute hypoxic respiratory failure - Chest pain free - EKG without ischemic changes - Echo 06/2022 shows LVEF 45-50%, repeat echo ordered - No plan for further ischemic evaluation at this time - Continue ASA, statin and Zetia   Hyperlipidemia - Most recent lipid panel 05/11/23 LDL 26, continue statin and Zetia   Hypertension - BP stable, continue PTA medications and titrate as needed   For questions or updates, please contact [facility] Please consult [email or web address] for contact info under    Signed, Lesley LITTIE Maffucci, PA-C  05/13/2023 2:32 PM

## 2023-05-13 NOTE — Progress Notes (Signed)
 PROGRESS NOTE    Benjamin WEIGELT  FMW:[id number] DOB: [birth year] DOA: 05/10/2023 PCP: Cleotilde Oneil FALCON, MD  Chief Complaint  Patient presents with   Shortness of Breath    Hospital Course:  Benjamin Torres is 68 y.o. 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  Patient was found to have influenza A.  He was hypoxic with increased work of breathing and initially started on nasal cannula oxygen.  Chest x-ray negative, CTA negative for PE.  Patient was admitted.  Subjective: Is very fatigued this morning.  Work of breathing has improved.  Has no complaints but is very drowsy.   Objective: Vitals:   05/13/23 0052 05/13/23 0058 05/13/23 0500 05/13/23 0914  BP: 118/84   112/81  Pulse: 88   78  Resp: 20   16  Temp: 98.3 F (36.8 C)   97.7 F (36.5 C)  TempSrc: Oral   Oral  SpO2: 92%   98%  Weight:  66 kg 68 kg   Height:  5' 6 (1.676 m)      Intake/Output Summary (Last 24 hours) at 05/13/2023 1340 Last data filed at 05/13/2023 0919 Gross per 24 hour  Intake 240 ml  Output 525 ml  Net -285 ml    Filed Weights   05/10/23 2344 05/13/23 0058 05/13/23 0500  Weight: 70.3 kg 66 kg 68 kg    Examination: General exam: In no acute distress, sleeping easily respiratory system: breathing comfortably on [place] O2, better aeration bilaterally, some end expiratory wheezing.   Cardiovascular system: irregularly irregular rythym Gastrointestinal system: Abdomen is nondistended, soft and nontender.  Neuro: Patient is drowsy, but arousable Extremities: Symmetric, expected ROM Skin: No rashes, lesions Psychiatry: Mood & affect appropriate for situation.   Assessment & Plan:   Principal Problem:   COPD exacerbation (HCC) Active Problems:   Influenza A   Chronic systolic CHF (congestive heart failure) (HCC)   CAD (coronary artery disease)   Myocardial injury   Acquired hypothyroidism   Hyperlipidemia   HTN (hypertension)   Tobacco use   COPD with acute exacerbation (HCC)    COPD exacerbation due to influenza A Acute hypoxic respiratory failure - Appears to be necessitating occasional BiPAP due to work of breathing.  Stable on nasal cannula now. - Continue with DuoNebs - Status post 2 g mag in ED - Status post Solu-Medrol  125, cont 40 mg every 8h.  Taper as tolerated - Continue Tamiflu  70 mg twice daily - Mucinex  as needed - Encourage incentive spirometry - Sputum culture sent, follow  Paroxsymal A-fib, intermittent RVR - Does not appear patient has history of A-fib outpatient, though he was also noted to be in A-fib RVR during COPD exacerbation 2022 ED visit -- EKG on arrival was NSR - Suspect that this is provoked by his current hypoxia and COPD exacerbation - Continue home dose metoprolol  succ for now - Continue to monitor on telemetry CHA2DS2-VASc: 4, would likely benefit from anticoagulation if atrial fibrillation persists. - Echocardiogram this admission, pending read --Cardiology consulted  Chronic systolic heart failure - Echocardiogram March 2024: EF 45 to 50%, patient is clinically euvolemic at this time.  No Pulm edema on chest x-ray. - Does not appear to be in exacerbation - Continue with GDMT as  BP tolerates  History of CAD - Troponin elevation - Trop elevation on arrival 13 -> 21 -> 34, and is without chest pain.   - Likely secondary to demand ischemia given his hypoxia and RVR as above - Continue aspirin  - Continue statin - Lipids at goal, LDL 26. - Hemoglobin A1c 5.5% -- Echo ordered, pending  Acquired hypothyroidism - Continue home dose Synthroid   Hyperlipidemia - Continue statin and Zetia   Hypertension - Currently  on metoprolol  and Cozaar  at home, will continue these for now  Current tobacco abuse - Nicotine  patch - Has been counseled on cessation  DVT prophylaxis: lovenox    Code Status: Full Code Family Communication: Discussed directly with patient.  Disposition: Inpatient  *Note, patient's wife is currently hospitalized at [facility] with identical presentation.     Consultants:      Procedures:    Antimicrobials:  Anti-infectives (From admission, onward)    Start     Dose/Rate Route Frequency Ordered Stop   05/12/23 1000  azithromycin  (ZITHROMAX ) tablet 250 mg  Status:  Discontinued       Placed in Followed by Linked Group   250 mg Oral Daily 05/10/23 2320 05/10/23 2345   05/11/23 1000  azithromycin  (ZITHROMAX ) tablet 500 mg  Status:  Discontinued       Placed in Followed by Linked Group   500 mg Oral Daily 05/10/23 2320 05/10/23 2345   05/11/23 0015  oseltamivir  (TAMIFLU ) capsule 75 mg        75 mg Oral 2 times daily 05/11/23 0006 05/15/23 2159       Data Reviewed: I have personally reviewed following labs and imaging studies CBC: Recent Labs  Lab 05/10/23 1856 05/13/23 0606  WBC 11.5* 16.5*  NEUTROABS  --  14.8*  HGB 16.8 15.2  HCT 51.7 46.6  MCV 90.1 89.8  PLT 180 184   Basic Metabolic Panel: Recent Labs  Lab 05/10/23 2106 05/13/23 0606  NA 136 138  K 4.5 4.7  CL 97* 103  CO2 25 28  GLUCOSE 112* 148*  BUN 13 37*  CREATININE 0.92 0.93  CALCIUM  9.3 8.7*  MG  --  2.3  PHOS  --  4.2   GFR: Estimated Creatinine Clearance: 68.6 mL/min (by C-G formula based on SCr of 0.93 mg/dL). Liver Function Tests: Recent Labs  Lab 05/13/23 0606  AST 26  ALT 25  ALKPHOS 32*  BILITOT 0.5  PROT 6.0*  ALBUMIN 3.1*   CBG: No results for input(s): GLUCAP in the last 168 hours.  Recent Results (from the past 240 hours)  Resp panel by RT-PCR (RSV, Flu A&B, Covid) Anterior Nasal Swab     Status: Abnormal   Collection Time: 05/10/23  8:54 PM   Specimen:  Anterior Nasal Swab  Result Value Ref Range Status   SARS Coronavirus 2 by RT PCR NEGATIVE NEGATIVE Final    Comment: (NOTE) SARS-CoV-2 target nucleic acids are NOT DETECTED.  The SARS-CoV-2 RNA is generally detectable in upper respiratory specimens during the acute phase of infection. The lowest concentration of SARS-CoV-2 viral copies this assay can detect is 138 copies/mL. A negative result does not preclude SARS-Cov-2 infection and should not be used as the sole basis for treatment or other patient management decisions. A negative result may occur with  improper specimen collection/handling, submission of specimen other than nasopharyngeal swab, presence of viral mutation(s) within the areas targeted by this assay, and inadequate number of viral copies(<138 copies/mL). A negative result must be combined with  clinical observations, patient history, and epidemiological information. The expected result is Negative.  Fact Sheet for Patients:  bloggercourse.com  Fact Sheet for Healthcare Providers:  seriousbroker.it  This test is no t yet approved or cleared by the [place]  FDA and  has been authorized for detection and/or diagnosis of SARS-CoV-2 by FDA under an Emergency Use Authorization (EUA). This EUA will remain  in effect (meaning this test can be used) for the duration of the COVID-19 declaration under Section 564(b)(1) of the Act, 21 U.S.C.section 360bbb-3(b)(1), unless the authorization is terminated  or revoked sooner.       Influenza A by PCR POSITIVE (A) NEGATIVE Final   Influenza B by PCR NEGATIVE NEGATIVE Final    Comment: (NOTE) The Xpert Xpress SARS-CoV-2/FLU/RSV plus assay is intended as an aid in the diagnosis of influenza from Nasopharyngeal swab specimens and should not be used as a sole basis for treatment. Nasal washings and aspirates are unacceptable for Xpert Xpress  SARS-CoV-2/FLU/RSV testing.  Fact Sheet for Patients: bloggercourse.com  Fact Sheet for Healthcare Providers: seriousbroker.it  This test is not yet approved or cleared by the [place]  FDA and has been authorized for detection and/or diagnosis of SARS-CoV-2 by FDA under an Emergency Use Authorization (EUA). This EUA will remain in effect (meaning this test can be used) for the duration of the COVID-19 declaration under Section 564(b)(1) of the Act, 21 U.S.C. section 360bbb-3(b)(1), unless the authorization is terminated or revoked.     Resp Syncytial Virus by PCR NEGATIVE NEGATIVE Final    Comment: (NOTE) Fact Sheet for Patients: bloggercourse.com  Fact Sheet for Healthcare Providers: seriousbroker.it  This test is not yet approved or cleared by the [place]  FDA and has been authorized for detection and/or diagnosis of SARS-CoV-2 by FDA under an Emergency Use Authorization (EUA). This EUA will remain in effect (meaning this test can be used) for the duration of the COVID-19 declaration under Section 564(b)(1) of the Act, 21 U.S.C. section 360bbb-3(b)(1), unless the authorization is terminated or revoked.  Performed at [facility], [address] [place], [place] [address]      Radiology Studies: No results found.   Scheduled Meds:  ALPRAZolam   0.5 mg Oral BID   aspirin  EC  81 mg Oral Daily   enoxaparin  (LOVENOX ) injection  40 mg Subcutaneous Q24H   ezetimibe   10 mg Oral Daily   gabapentin   100 mg Oral Daily   ipratropium-albuterol   3 mL Nebulization Q4H   [START ON 05/14/2023] levothyroxine   100 mcg Oral Q0600   losartan   50 mg Oral Daily   methylPREDNISolone  (SOLU-MEDROL ) injection  40 mg Intravenous Q8H   metoprolol  succinate  25 mg Oral Daily   montelukast   10 mg Oral Daily   nicotine   21 mg Transdermal Daily   oseltamivir   75 mg Oral BID    rosuvastatin   10 mg Oral QHS   tamsulosin   0.4 mg Oral Daily   Continuous Infusions:   LOS: 2 days    Time spent:  55min  Byrd Rushlow, DO [facility]  To contact the attending physician between 7A-7P please use Epic Chat. To contact the covering physician during after hours 7P-7A, please review Amion.   05/13/2023, 1:40 PM   *This document has been created with the assistance of dictation software. Please excuse typographical errors. *

## 2023-05-13 NOTE — Plan of Care (Signed)

## 2023-05-13 NOTE — Plan of Care (Signed)

## 2023-05-14 DIAGNOSIS — I4819 Other persistent atrial fibrillation: Secondary | ICD-10-CM

## 2023-05-14 DIAGNOSIS — I25118 Atherosclerotic heart disease of native coronary artery with other forms of angina pectoris: Secondary | ICD-10-CM | POA: Diagnosis not present

## 2023-05-14 DIAGNOSIS — J101 Influenza due to other identified influenza virus with other respiratory manifestations: Secondary | ICD-10-CM

## 2023-05-14 DIAGNOSIS — I5022 Chronic systolic (congestive) heart failure: Secondary | ICD-10-CM | POA: Diagnosis not present

## 2023-05-14 DIAGNOSIS — Z720 Tobacco use: Secondary | ICD-10-CM | POA: Diagnosis not present

## 2023-05-14 DIAGNOSIS — J441 Chronic obstructive pulmonary disease with (acute) exacerbation: Secondary | ICD-10-CM | POA: Diagnosis not present

## 2023-05-14 LAB — CBC WITH DIFFERENTIAL/PLATELET
Abs Immature Granulocytes: 0.06 10*3/uL (ref 0.00–0.07)
Basophils Absolute: 0 10*3/uL (ref 0.0–0.1)
Basophils Relative: 0 %
Eosinophils Absolute: 0 10*3/uL (ref 0.0–0.5)
Eosinophils Relative: 0 %
HCT: 47.2 % (ref 39.0–52.0)
Hemoglobin: 15.2 g/dL (ref 13.0–17.0)
Immature Granulocytes: 0 %
Lymphocytes Relative: 5 %
Lymphs Abs: 0.7 10*3/uL (ref 0.7–4.0)
MCH: 29.2 pg (ref 26.0–34.0)
MCHC: 32.2 g/dL (ref 30.0–36.0)
MCV: 90.8 fL (ref 80.0–100.0)
Monocytes Absolute: 1 10*3/uL (ref 0.1–1.0)
Monocytes Relative: 7 %
Neutro Abs: 12.6 10*3/uL — ABNORMAL HIGH (ref 1.7–7.7)
Neutrophils Relative %: 88 %
Platelets: 179 10*3/uL (ref 150–400)
RBC: 5.2 MIL/uL (ref 4.22–5.81)
RDW: 13.2 % (ref 11.5–15.5)
WBC: 14.4 10*3/uL — ABNORMAL HIGH (ref 4.0–10.5)
nRBC: 0 % (ref 0.0–0.2)

## 2023-05-14 LAB — COMPREHENSIVE METABOLIC PANEL
ALT: 29 U/L (ref 0–44)
AST: 24 U/L (ref 15–41)
Albumin: 3 g/dL — ABNORMAL LOW (ref 3.5–5.0)
Alkaline Phosphatase: 31 U/L — ABNORMAL LOW (ref 38–126)
Anion gap: 7 (ref 5–15)
BUN: 29 mg/dL — ABNORMAL HIGH (ref 8–23)
CO2: 29 mmol/L (ref 22–32)
Calcium: 9.3 mg/dL (ref 8.9–10.3)
Chloride: 103 mmol/L (ref 98–111)
Creatinine, Ser: 0.64 mg/dL (ref 0.61–1.24)
GFR, Estimated: >60 mL/min (ref >60)
Glucose, Bld: 139 mg/dL — ABNORMAL HIGH (ref 70–99)
Potassium: 4.8 mmol/L (ref 3.5–5.1)
Sodium: 139 mmol/L (ref 135–145)
Total Bilirubin: 0.8 mg/dL (ref 0.0–1.2)
Total Protein: 5.8 g/dL — ABNORMAL LOW (ref 6.5–8.1)

## 2023-05-14 LAB — MAGNESIUM: Magnesium: 2.1 mg/dL (ref 1.7–2.4)

## 2023-05-14 LAB — PHOSPHORUS: Phosphorus: 3.9 mg/dL (ref 2.5–4.6)

## 2023-05-14 MED ORDER — METOPROLOL SUCCINATE ER 50 MG PO TB24
50.0000 mg | ORAL_TABLET | Freq: Every day | ORAL | Status: DC
  Administered 2023-05-15 – 2023-05-16 (×2): 50 mg via ORAL
  Filled 2023-05-14 (×2): qty 1

## 2023-05-14 MED ORDER — IPRATROPIUM-ALBUTEROL 0.5-2.5 (3) MG/3ML IN SOLN
3.0000 mL | Freq: Four times a day (QID) | RESPIRATORY_TRACT | Status: DC
  Administered 2023-05-14 – 2023-05-15 (×4): 3 mL via RESPIRATORY_TRACT
  Filled 2023-05-14 (×5): qty 3

## 2023-05-14 MED ORDER — METOPROLOL SUCCINATE ER 25 MG PO TB24
25.0000 mg | ORAL_TABLET | Freq: Once | ORAL | Status: AC
  Administered 2023-05-14: 25 mg via ORAL
  Filled 2023-05-14: qty 1

## 2023-05-14 MED ORDER — LOSARTAN POTASSIUM 25 MG PO TABS
25.0000 mg | ORAL_TABLET | Freq: Every day | ORAL | Status: DC
  Administered 2023-05-15: 25 mg via ORAL
  Filled 2023-05-14: qty 1

## 2023-05-14 NOTE — Care Management Important Message (Signed)
 Important Message  Patient Details  Name: Benjamin Torres MRN: [id number] Date of Birth: 23-Apr-1956   Important Message Given:  Yes - Medicare IM     Arthella Headings W, CMA 05/14/2023, 11:50 AM

## 2023-05-14 NOTE — Progress Notes (Signed)
 Rounding Note    Patient Name: Benjamin Torres Date of Encounter: 05/14/2023  [facility] Cardiologist: Belva Boyden, MD   Subjective   Patient reports that breathing is improved some since yesterday. He does endorse intermittent palpitations. Denies chest pain, lightheadedness, and dizziness. Telemetry shows atrial fibrillation with rate 70-100 bpm.   Inpatient Medications    Scheduled Meds:  ALPRAZolam   0.5 mg Oral BID   apixaban   5 mg Oral BID   aspirin  EC  81 mg Oral Daily   ezetimibe   10 mg Oral Daily   gabapentin   100 mg Oral Daily   ipratropium-albuterol   3 mL Nebulization Q6H   levothyroxine   100 mcg Oral Q0600   losartan   50 mg Oral Daily   methylPREDNISolone  (SOLU-MEDROL ) injection  40 mg Intravenous Q8H   metoprolol  succinate  25 mg Oral Daily   montelukast   10 mg Oral Daily   nicotine   21 mg Transdermal Daily   oseltamivir   75 mg Oral BID   rosuvastatin   10 mg Oral QHS   tamsulosin   0.4 mg Oral Daily   Continuous Infusions:  PRN Meds: acetaminophen , albuterol , dextromethorphan -guaiFENesin , famotidine , hydrALAZINE , hydrOXYzine , ondansetron  (ZOFRAN ) IV   Vital Signs    Vitals:   05/14/23 0043 05/14/23 0352 05/14/23 0500 05/14/23 0906  BP:  (!) 101/52  125/84  Pulse:  (!) 57  (!) 50  Resp:  19  16  Temp:  98.9 F (37.2 C)  97.8 F (36.6 C)  TempSrc:  Oral    SpO2: 100% 100%  98%  Weight:   66.5 kg   Height:        Intake/Output Summary (Last 24 hours) at 05/14/2023 0909 Last data filed at 05/14/2023 0424 Gross per 24 hour  Intake 120 ml  Output 725 ml  Net -605 ml      05/14/2023    5:00 AM 05/13/2023    5:00 AM 05/13/2023   12:58 AM  Last 3 Weights  Weight (lbs) 146 lb 9.7 oz 149 lb 14.6 oz 145 lb 8.1 oz  Weight (kg) 66.5 kg 68 kg 66 kg      Telemetry    Converted from sinus rhythm to atrial flutter ~1630 yesterday afternoon and subsequently converted to atrial fibrillation rate 70-100 bpm - Personally Reviewed  Physical  Exam   GEN: No acute distress.   Neck: No JVD Cardiac: IRIR, no murmurs, rubs, or gallops.  Respiratory: Diminished and coarse breath sounds bilaterally. GI: Soft, nontender, non-distended  MS: No edema; No deformity. Neuro:  Nonfocal  Psych: Normal affect   Labs    High Sensitivity Troponin:   Recent Labs  Lab 05/10/23 1856 05/10/23 2106 05/11/23 0352 05/13/23 0606  TROPONINIHS 13 21* 34* 17     Chemistry Recent Labs  Lab 05/10/23 2106 05/13/23 0606 05/14/23 0617  NA 136 138 139  K 4.5 4.7 4.8  CL 97* 103 103  CO2 25 28 29   GLUCOSE 112* 148* 139*  BUN 13 37* 29*  CREATININE 0.92 0.93 0.64  CALCIUM  9.3 8.7* 9.3  MG  --  2.3 2.1  PROT  --  6.0* 5.8*  ALBUMIN  --  3.1* 3.0*  AST  --  26 24  ALT  --  25 29  ALKPHOS  --  32* 31*  BILITOT  --  0.5 0.8  GFRNONAA >60 >60 >60  ANIONGAP 14 7 7     Lipids  Recent Labs  Lab 05/11/23 0352  CHOL 80  TRIG  45  HDL 45  LDLCALC 26  CHOLHDL 1.8    Hematology Recent Labs  Lab 05/10/23 1856 05/13/23 0606 05/14/23 0617  WBC 11.5* 16.5* 14.4*  RBC 5.74 5.19 5.20  HGB 16.8 15.2 15.2  HCT 51.7 46.6 47.2  MCV 90.1 89.8 90.8  MCH 29.3 29.3 29.2  MCHC 32.5 32.6 32.2  RDW 13.2 13.2 13.2  PLT 180 184 179   Thyroid  No results for input(s): "TSH", "FREET4" in the last 168 hours.  BNP Recent Labs  Lab 05/10/23 2335  BNP 137.8*    DDimer No results for input(s): "DDIMER" in the last 168 hours.   Radiology    ECHOCARDIOGRAM COMPLETE Result Date: 05/13/2023    ECHOCARDIOGRAM REPORT   Patient Name:   Benjamin Torres Date of Exam: 05/12/2023 Medical Rec #:  [id number]        Height:       66.0 in Accession #:    [id number]       Weight:       155.0 lb Date of Birth:  21-Feb-1956         BSA:          1.794 m Patient Age:    67 years         BP:           130/102 mmHg Patient Gender: M                HR:           92 bpm. Exam Location:  [facility] Procedure: 2D Echo, Cardiac Doppler, Color Doppler and Intracardiac             Opacification Agent Indications:     Atrial Fibrillation  History:         Patient has prior history of Echocardiogram examinations, most                  recent 07/25/2022. CHF, CAD, COPD, Arrythmias:Atrial                  Fibrillation, Signs/Symptoms:Shortness of Breath; Risk                  Factors:Hypertension, Dyslipidemia and Current Smoker. FLU +.  Sonographer:     Clarke Crouch Referring Phys:  [id number] Roise Cleaver Diagnosing Phys: Sammy Crisp MD  Sonographer Comments: Technically difficult study due to poor echo windows and no subcostal window. Image acquisition challenging due to COPD and Image acquisition challenging due to respiratory motion. IMPRESSIONS  1. Left ventricular ejection fraction, by estimation, is 55 to 60%. The left ventricle has normal function. Left ventricular endocardial border not optimally defined to evaluate regional wall motion. Left ventricular diastolic parameters are indeterminate.  2. Right ventricular systolic function is mildly reduced. The right ventricular size is moderately enlarged.  3. The mitral valve was not well visualized. No evidence of mitral valve regurgitation. No evidence of mitral stenosis.  4. The aortic valve was not well visualized. Aortic valve regurgitation is not visualized. No aortic stenosis is present. FINDINGS  Left Ventricle: Left ventricular ejection fraction, by estimation, is 55 to 60%. The left ventricle has normal function. Left ventricular endocardial border not optimally defined to evaluate regional wall motion. Definity  contrast agent was given IV to delineate the left ventricular endocardial borders. The left ventricular internal cavity size was normal in size. There is no left ventricular hypertrophy. Left ventricular diastolic parameters are indeterminate. Right Ventricle: Pulmonary artery pressure is upper normal  to mildly elevated (RVSP 30 mmHg plus central venous/right atrial pressure). The right ventricular size is  moderately enlarged. No increase in right ventricular wall thickness. Right ventricular systolic function is mildly reduced. Left Atrium: Left atrial size was normal in size. Right Atrium: Right atrial size was normal in size. Pericardium: Trivial pericardial effusion is present. Mitral Valve: The mitral valve was not well visualized. No evidence of mitral valve regurgitation. No evidence of mitral valve stenosis. MV peak gradient, 2.5 mmHg. The mean mitral valve gradient is 1.0 mmHg. Tricuspid Valve: The tricuspid valve is not well visualized. Tricuspid valve regurgitation is trivial. Aortic Valve: The aortic valve was not well visualized. Aortic valve regurgitation is not visualized. No aortic stenosis is present. Pulmonic Valve: The pulmonic valve was not well visualized. Pulmonic valve regurgitation is not visualized. No evidence of pulmonic stenosis. Aorta: The aortic root is normal in size and structure. Pulmonary Artery: The pulmonary artery is not well seen. Venous: The inferior vena cava was not well visualized. IAS/Shunts: The interatrial septum was not well visualized.  LEFT VENTRICLE PLAX 2D LVIDd:         4.40 cm LVIDs:         3.00 cm LV PW:         1.00 cm LV IVS:        1.00 cm LVOT diam:     2.00 cm LV SV:         44 LV SV Index:   25 LVOT Area:     3.14 cm  RIGHT VENTRICLE RV Basal diam:  4.05 cm RV Mid diam:    3.10 cm LEFT ATRIUM           Index        RIGHT ATRIUM           Index LA diam:      3.50 cm 1.95 cm/m   RA Area:     17.20 cm LA Vol (A4C): 29.4 ml 16.38 ml/m  RA Volume:   45.00 ml  25.08 ml/m  AORTIC VALVE LVOT Vmax:   71.70 cm/s LVOT Vmean:  50.600 cm/s LVOT VTI:    0.141 m  AORTA Ao Root diam: 3.20 cm MITRAL VALVE               TRICUSPID VALVE MV Area (PHT): 7.90 cm    TR Peak grad:   29.8 mmHg MV Area VTI:   5.76 cm    TR Vmax:        273.00 cm/s MV Peak grad:  2.5 mmHg MV Mean grad:  1.0 mmHg    SHUNTS MV Vmax:       0.79 m/s    Systemic VTI:  0.14 m MV Vmean:      44.9 cm/s    Systemic Diam: 2.00 cm MV Decel Time: 96 msec MV E velocity: 60.70 cm/s Sammy Crisp MD Electronically signed by Sammy Crisp MD Signature Date/Time: 05/13/2023/3:10:21 PM    Final     Cardiac Studies   07/25/2022 TTE 1. Left ventricular ejection fraction, by estimation, is 45 to 50%. Left  ventricular ejection fraction by 3D volume is 49 %. Left ventricular  ejection fraction by 2D MOD biplane is 45.3 %. The left ventricle has  mildly decreased function. The left  ventricle has no regional wall motion abnormalities. There is mild left  ventricular hypertrophy. Left ventricular diastolic parameters are  consistent with Grade I diastolic dysfunction (impaired relaxation).   2. Right ventricular systolic  function is normal. The right ventricular  size is normal. There is normal pulmonary artery systolic pressure. The  estimated right ventricular systolic pressure is 34.6 mmHg.   3. The mitral valve is normal in structure. Mild mitral valve  regurgitation. No evidence of mitral stenosis.   4. The aortic valve is tricuspid. Aortic valve regurgitation is not  visualized. No aortic stenosis is present.   5. The inferior vena cava is normal in size with greater than 50%  respiratory variability, suggesting right atrial pressure of 3 mmHg.    09/23/2019 LHC Prox RCA lesion is 100% stenosed. 1st Mrg lesion is 100% stenosed. 2nd Diag lesion is 75% stenosed. LM-normal LAD-insignificant disease LCx-groove circ was without disease, occluded om1 RCA-occluded proximally with collaterals from left EF normal Medical management.    Patient Profile     Benjamin Torres is a 68 y.o. 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   Assessment & Plan    Paroxysmal atrial fibrillation - Hx of atrial fibrillation during prior hospitalization 07/2020 for COPD exacerbation and PNA. Spontaneously converted to sinus rhythm, no further workup was done. Outpatient EKGs all show sinus rhythm.  - EKG on arrival showed sinus rhythm - Telemetry shows conversion from sinus rhythm to atrial flutter ~1630 yesterday 1/14 afternoon and subsequently conversion to atrial fibrillation (70-100 bpm) with frequent PVCs - Suspect secondary to acute respiratory disease - CHA2DS2VASc 4 - Continue Eliquis  5 mg BID (started yesterday 1/14) - Could consider outpatient Zio monitoring for further insight on need for long term DOAC - Will increase metoprolol  succinate to 50 mg BID for better rate control and subsequently decrease losartan  to 25 mg to prevent hypotension  COPD exacerbation due to influenza A Acute hypoxic respiratory failure Tobacco use - Currently on 3 L supplemental O2 - Management per IM   Chronic systolic heart failure - Echo 4/03 shows LVEF 55-60% - Patient is euvolemic on exam without acute changes on CXR, do not suspect exacerbation - Recommend continuation of GDMT including losartan  and metoprolol  succinate as BP allows  CAD Elevated troponin - Cath 08/2019 showed chronically occluded proximal RCA lesion with collaterals, a first marginal lesion that was chronically occluded, and a second diagonal lesion that was 75% stenosed recommendations were for medical management  - Troponin peaked at 34, suspect demand ischemia secondary to COPD exacerbation and acute hypoxic respiratory failure - Chest pain free - EKG without ischemic changes - Echo with improved LVEF 55-60% - No plan for further ischemic evaluation at this time - Continue ASA, statin and Zetia    Hyperlipidemia - Most recent lipid panel 05/11/23 LDL 26, continue statin and Zetia    Hypertension - BP stable, continue PTA medications and titrate  as needed  For questions or updates, please contact [facility] Please consult [email or web address] for contact info under        Signed, Brodie Cannon, PA-C  05/14/2023, 9:09 AM

## 2023-05-14 NOTE — Progress Notes (Signed)
 Notified by CCMD of sustained trigeminy, pt resting when assessed, denies pain or discomfort. asymptomatic. While assessing pt, received another call that pt now aflutter., rate dropped in 30s. Currently running in 70s. Vitals stable. Modou Jawo,NP notified, received order for EKG. EKG performed, and relayed to Modou Jawo,NP. Will update with any changes or if pt becomes symptomatic per Modou Jawo, NP.

## 2023-05-14 NOTE — Progress Notes (Addendum)
 Progress Note    BELAL ALVARDO  ZOX:[id number] DOB: 08-04-1955  DOA: 05/10/2023 PCP: Sari Cunning, MD      Brief Narrative:    Medical records reviewed and are as summarized below:  Benjamin Torres is a 68 y.o. 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   He was admitted to the hospital for influenza A infection, COPD exacerbation acute hypoxemic respiratory failure.    Assessment/Plan:   Principal Problem:   COPD exacerbation (HCC) Active Problems:   Influenza A   Chronic systolic CHF (congestive heart failure) (HCC)   CAD (coronary artery disease)   Myocardial injury   Acquired hypothyroidism   Hyperlipidemia   HTN (hypertension)   Tobacco use   COPD with acute exacerbation (HCC)   Paroxysmal atrial fibrillation (HCC)    Body mass index is 23.66 kg/m.   Influenza A infection: Continue Tamiflu .  Antitussives as needed   COPD exacerbation: Continue IV steroids and bronchodilators. Patient has been counseled to stop smoking cigarettes   Acute hypoxemic respiratory failure: He is off of BiPAP.  He is tolerating 3 L/min oxygen via nasal cannula.  Wean off oxygen as able. He may need home oxygen   Paroxysmal atrial fibrillation with RVR: Heart rate is better.  Continue metoprolol .  He has been started on Eliquis . Aspirin  has been discontinued.   Chronic heart failure with midrange ejection fraction: Compensated.  2D echo in March 2024 showed EF estimated 45 to 50%.   Comorbidities include hypothyroidism, CAD, hyperlipidemia, hypertension, tobacco use disorder   Encouraged ambulation.   Diet Order             Diet Heart  Room service appropriate? Yes; Fluid consistency: Thin  Diet effective now                            Consultants: Cardiologist  Procedures: None    Medications:    ALPRAZolam   0.5 mg Oral BID   apixaban   5 mg Oral BID   aspirin  EC  81 mg Oral Daily   ezetimibe   10 mg Oral Daily   gabapentin   100 mg Oral Daily   ipratropium-albuterol   3 mL Nebulization Q6H   levothyroxine   100 mcg Oral Q0600   losartan   50 mg Oral Daily   methylPREDNISolone  (SOLU-MEDROL ) injection  40 mg Intravenous Q8H   metoprolol  succinate  25 mg Oral Daily   montelukast   10 mg Oral Daily   nicotine   21 mg Transdermal Daily   oseltamivir   75 mg Oral BID   rosuvastatin   10 mg Oral QHS   tamsulosin   0.4 mg Oral Daily   Continuous Infusions:   Anti-infectives (From admission, onward)    Start     Dose/Rate Route Frequency Ordered Stop   05/12/23 1000  azithromycin  (ZITHROMAX ) tablet 250 mg  Status:  Discontinued       Placed in "Followed by" Linked Group   250 mg Oral Daily 05/10/23 2320 05/10/23 2345   05/11/23 1000  azithromycin  (ZITHROMAX ) tablet 500 mg  Status:  Discontinued       Placed in "Followed by" Linked Group   500 mg Oral Daily 05/10/23 2320 05/10/23  2345   05/11/23 0015  oseltamivir  (TAMIFLU ) capsule 75 mg        75 mg Oral 2 times daily 05/11/23 0006 05/15/23 2159              Family Communication/Anticipated D/C date and plan/Code Status   DVT prophylaxis:  apixaban  (ELIQUIS ) tablet 5 mg     Code Status: Full Code  Family Communication: None Disposition Plan: Plan to discharge home   Status is: Inpatient Remains inpatient appropriate because: COPD exacerbation and respiratory failure       Subjective:   Interval events noted.  He feels a little better.  He complains of cough.  Breathing is a little better.  Objective:    Vitals:   05/14/23 0043 05/14/23 0352 05/14/23 0500 05/14/23 0906  BP:  (!) 101/52  125/84  Pulse:  (!) 57  (!) 50   Resp:  19  16  Temp:  98.9 F (37.2 C)  97.8 F (36.6 C)  TempSrc:  Oral    SpO2: 100% 100%  98%  Weight:   66.5 kg   Height:       No data found.   Intake/Output Summary (Last 24 hours) at 05/14/2023 1001 Last data filed at 05/14/2023 0424 Gross per 24 hour  Intake 120 ml  Output 500 ml  Net -380 ml   Filed Weights   05/13/23 0058 05/13/23 0500 05/14/23 0500  Weight: 66 kg 68 kg 66.5 kg    Exam:  GEN: NAD SKIN: Warm and dry EYES: No pallor or icterus ENT: MMM CV: RRR PULM: Bilateral expiratory wheezing, no rales heard ABD: soft, ND, NT, +BS CNS: AAO x 3, non focal EXT: No edema or tenderness      Data Reviewed:   I have personally reviewed following labs and imaging studies:  Labs: Labs show the following:   Basic Metabolic Panel: Recent Labs  Lab 05/10/23 2106 05/13/23 0606 05/14/23 0617  NA 136 138 139  K 4.5 4.7 4.8  CL 97* 103 103  CO2 25 28 29   GLUCOSE 112* 148* 139*  BUN 13 37* 29*  CREATININE 0.92 0.93 0.64  CALCIUM  9.3 8.7* 9.3  MG  --  2.3 2.1  PHOS  --  4.2 3.9   GFR Estimated Creatinine Clearance: 79.8 mL/min (by C-G formula based on SCr of 0.64 mg/dL). Liver Function Tests: Recent Labs  Lab 05/13/23 0606 05/14/23 0617  AST 26 24  ALT 25 29  ALKPHOS 32* 31*  BILITOT 0.5 0.8  PROT 6.0* 5.8*  ALBUMIN 3.1* 3.0*   No results for input(s): "LIPASE", "AMYLASE" in the last 168 hours. No results for input(s): "AMMONIA" in the last 168 hours. Coagulation profile No results for input(s): "INR", "PROTIME" in the last 168 hours.  CBC: Recent Labs  Lab 05/10/23 1856 05/13/23 0606 05/14/23 0617  WBC 11.5* 16.5* 14.4*  NEUTROABS  --  14.8* 12.6*  HGB 16.8 15.2 15.2  HCT 51.7 46.6 47.2  MCV 90.1 89.8 90.8  PLT 180 184 179   Cardiac Enzymes: No results for input(s): "CKTOTAL", "CKMB", "CKMBINDEX", "TROPONINI" in the last 168 hours. BNP (last 3 results) No results for input(s): "PROBNP" in the last 8760 hours. CBG: No  results for input(s): "GLUCAP" in the last 168 hours. D-Dimer: No results for input(s): "DDIMER" in the last 72 hours. Hgb A1c: No results for input(s): "HGBA1C" in the last 72 hours. Lipid Profile: No results for input(s): "CHOL", "HDL", "LDLCALC", "TRIG", "CHOLHDL", "LDLDIRECT" in the last  72 hours. Thyroid  function studies: No results for input(s): "TSH", "T4TOTAL", "T3FREE", "THYROIDAB" in the last 72 hours.  Invalid input(s): "FREET3" Anemia work up: No results for input(s): "VITAMINB12", "FOLATE", "FERRITIN", "TIBC", "IRON", "RETICCTPCT" in the last 72 hours. Sepsis Labs: Recent Labs  Lab 05/10/23 1856 05/13/23 0606 05/14/23 0617  WBC 11.5* 16.5* 14.4*    Microbiology Recent Results (from the past 240 hours)  Resp panel by RT-PCR (RSV, Flu A&B, Covid) Anterior Nasal Swab     Status: Abnormal   Collection Time: 05/10/23  8:54 PM   Specimen: Anterior Nasal Swab  Result Value Ref Range Status   SARS Coronavirus 2 by RT PCR NEGATIVE NEGATIVE Final    Comment: (NOTE) SARS-CoV-2 target nucleic acids are NOT DETECTED.  The SARS-CoV-2 RNA is generally detectable in upper respiratory specimens during the acute phase of infection. The lowest concentration of SARS-CoV-2 viral copies this assay can detect is 138 copies/mL. A negative result does not preclude SARS-Cov-2 infection and should not be used as the sole basis for treatment or other patient management decisions. A negative result may occur with  improper specimen collection/handling, submission of specimen other than nasopharyngeal swab, presence of viral mutation(s) within the areas targeted by this assay, and inadequate number of viral copies(<138 copies/mL). A negative result must be combined with clinical observations, patient history, and epidemiological information. The expected result is Negative.  Fact Sheet for Patients:  BloggerCourse.com  Fact Sheet for Healthcare Providers:   SeriousBroker.it  This test is no t yet approved or cleared by the [place]  FDA and  has been authorized for detection and/or diagnosis of SARS-CoV-2 by FDA under an Emergency Use Authorization (EUA). This EUA will remain  in effect (meaning this test can be used) for the duration of the COVID-19 declaration under Section 564(b)(1) of the Act, 21 U.S.C.section 360bbb-3(b)(1), unless the authorization is terminated  or revoked sooner.       Influenza A by PCR POSITIVE (A) NEGATIVE Final   Influenza B by PCR NEGATIVE NEGATIVE Final    Comment: (NOTE) The Xpert Xpress SARS-CoV-2/FLU/RSV plus assay is intended as an aid in the diagnosis of influenza from Nasopharyngeal swab specimens and should not be used as a sole basis for treatment. Nasal washings and aspirates are unacceptable for Xpert Xpress SARS-CoV-2/FLU/RSV testing.  Fact Sheet for Patients: BloggerCourse.com  Fact Sheet for Healthcare Providers: SeriousBroker.it  This test is not yet approved or cleared by the [place]  FDA and has been authorized for detection and/or diagnosis of SARS-CoV-2 by FDA under an Emergency Use Authorization (EUA). This EUA will remain in effect (meaning this test can be used) for the duration of the COVID-19 declaration under Section 564(b)(1) of the Act, 21 U.S.C. section 360bbb-3(b)(1), unless the authorization is terminated or revoked.     Resp Syncytial Virus by PCR NEGATIVE NEGATIVE Final    Comment: (NOTE) Fact Sheet for Patients: BloggerCourse.com  Fact Sheet for Healthcare Providers: SeriousBroker.it  This test is not yet approved or cleared by the [place]  FDA and has been authorized for detection and/or diagnosis of SARS-CoV-2 by FDA under an Emergency Use Authorization (EUA). This EUA will remain in effect (meaning this test can be used)  for the duration of the COVID-19 declaration under Section 564(b)(1) of the Act, 21 U.S.C. section 360bbb-3(b)(1), unless the authorization is terminated or revoked.  Performed at [facility], [address], [place], [place] [address]     Procedures and diagnostic studies:  ECHOCARDIOGRAM COMPLETE Result  Date: 05/13/2023    ECHOCARDIOGRAM REPORT   Patient Name:   Benjamin Torres Date of Exam: 05/12/2023 Medical Rec #:  [id number]        Height:       66.0 in Accession #:    [id number]       Weight:       155.0 lb Date of Birth:  1956/04/11         BSA:          1.794 m Patient Age:    67 years         BP:           130/102 mmHg Patient Gender: M                HR:           92 bpm. Exam Location:  [facility] Procedure: 2D Echo, Cardiac Doppler, Color Doppler and Intracardiac            Opacification Agent Indications:     Atrial Fibrillation  History:         Patient has prior history of Echocardiogram examinations, most                  recent 07/25/2022. CHF, CAD, COPD, Arrythmias:Atrial                  Fibrillation, Signs/Symptoms:Shortness of Breath; Risk                  Factors:Hypertension, Dyslipidemia and Current Smoker. FLU +.  Sonographer:     Clarke Crouch Referring Phys:  [id number] Roise Cleaver Diagnosing Phys: Sammy Crisp MD  Sonographer Comments: Technically difficult study due to poor echo windows and no subcostal window. Image acquisition challenging due to COPD and Image acquisition challenging due to respiratory motion. IMPRESSIONS  1. Left ventricular ejection fraction, by estimation, is 55 to 60%. The left ventricle has normal function. Left ventricular endocardial border not optimally defined to evaluate regional wall motion. Left ventricular diastolic parameters are indeterminate.  2. Right ventricular systolic function is mildly reduced. The right ventricular size is moderately enlarged.  3. The mitral valve was not well visualized. No evidence of mitral valve  regurgitation. No evidence of mitral stenosis.  4. The aortic valve was not well visualized. Aortic valve regurgitation is not visualized. No aortic stenosis is present. FINDINGS  Left Ventricle: Left ventricular ejection fraction, by estimation, is 55 to 60%. The left ventricle has normal function. Left ventricular endocardial border not optimally defined to evaluate regional wall motion. Definity  contrast agent was given IV to delineate the left ventricular endocardial borders. The left ventricular internal cavity size was normal in size. There is no left ventricular hypertrophy. Left ventricular diastolic parameters are indeterminate. Right Ventricle: Pulmonary artery pressure is upper normal to mildly elevated (RVSP 30 mmHg plus central venous/right atrial pressure). The right ventricular size is moderately enlarged. No increase in right ventricular wall thickness. Right ventricular systolic function is mildly reduced. Left Atrium: Left atrial size was normal in size. Right Atrium: Right atrial size was normal in size. Pericardium: Trivial pericardial effusion is present. Mitral Valve: The mitral valve was not well visualized. No evidence of mitral valve regurgitation. No evidence of mitral valve stenosis. MV peak gradient, 2.5 mmHg. The mean mitral valve gradient is 1.0 mmHg. Tricuspid Valve: The tricuspid valve is not well visualized. Tricuspid valve regurgitation is trivial. Aortic Valve: The aortic valve was not well visualized. Aortic valve regurgitation is  not visualized. No aortic stenosis is present. Pulmonic Valve: The pulmonic valve was not well visualized. Pulmonic valve regurgitation is not visualized. No evidence of pulmonic stenosis. Aorta: The aortic root is normal in size and structure. Pulmonary Artery: The pulmonary artery is not well seen. Venous: The inferior vena cava was not well visualized. IAS/Shunts: The interatrial septum was not well visualized.  LEFT VENTRICLE PLAX 2D LVIDd:          4.40 cm LVIDs:         3.00 cm LV PW:         1.00 cm LV IVS:        1.00 cm LVOT diam:     2.00 cm LV SV:         44 LV SV Index:   25 LVOT Area:     3.14 cm  RIGHT VENTRICLE RV Basal diam:  4.05 cm RV Mid diam:    3.10 cm LEFT ATRIUM           Index        RIGHT ATRIUM           Index LA diam:      3.50 cm 1.95 cm/m   RA Area:     17.20 cm LA Vol (A4C): 29.4 ml 16.38 ml/m  RA Volume:   45.00 ml  25.08 ml/m  AORTIC VALVE LVOT Vmax:   71.70 cm/s LVOT Vmean:  50.600 cm/s LVOT VTI:    0.141 m  AORTA Ao Root diam: 3.20 cm MITRAL VALVE               TRICUSPID VALVE MV Area (PHT): 7.90 cm    TR Peak grad:   29.8 mmHg MV Area VTI:   5.76 cm    TR Vmax:        273.00 cm/s MV Peak grad:  2.5 mmHg MV Mean grad:  1.0 mmHg    SHUNTS MV Vmax:       0.79 m/s    Systemic VTI:  0.14 m MV Vmean:      44.9 cm/s   Systemic Diam: 2.00 cm MV Decel Time: 96 msec MV E velocity: 60.70 cm/s Sammy Crisp MD Electronically signed by Sammy Crisp MD Signature Date/Time: 05/13/2023/3:10:21 PM    Final                LOS: 3 days   Brently Voorhis  [facility]   Pager on [email or web address]. If 7PM-7AM, please contact night-coverage at [email or web address]     05/14/2023, 10:01 AM

## 2023-05-14 NOTE — Plan of Care (Signed)
  Problem: Clinical Measurements: Goal: Will remain free from infection Outcome: Progressing   Problem: Clinical Measurements: Goal: Diagnostic test results will improve Outcome: Progressing   Problem: Nutrition: Goal: Adequate nutrition will be maintained Outcome: Progressing   Problem: Pain Management: Goal: General experience of comfort will improve Outcome: Progressing

## 2023-05-14 NOTE — Plan of Care (Signed)

## 2023-05-14 NOTE — TOC CM/SW Note (Signed)
 Transition of Care [facility]) - Inpatient Brief Assessment   Patient Details  Name: Benjamin Torres MRN: [id number] Date of Birth: January 12, 1956  Transition of Care [facility]) CM/SW Contact:    Odilia Bennett, LCSW Phone Number: 05/14/2023, 8:31 AM   Clinical Narrative: CSW reviewed chart. Patient is currently on acute oxygen. Will follow for this potential need. No other TOC needs identified so far.  Transition of Care Asessment: Insurance and Status: Insurance coverage has been reviewed Patient has primary care physician: Yes Home environment has been reviewed: Single family home Prior level of function:: Not documented Prior/Current Home Services: No current home services Social Drivers of Health Review: SDOH reviewed no interventions necessary Readmission risk has been reviewed: Yes Transition of care needs: transition of care needs identified, TOC will continue to follow

## 2023-05-15 ENCOUNTER — Telehealth (HOSPITAL_COMMUNITY): Payer: Self-pay | Admitting: Pharmacy Technician

## 2023-05-15 ENCOUNTER — Other Ambulatory Visit (HOSPITAL_COMMUNITY): Payer: Self-pay

## 2023-05-15 DIAGNOSIS — I25118 Atherosclerotic heart disease of native coronary artery with other forms of angina pectoris: Secondary | ICD-10-CM | POA: Diagnosis not present

## 2023-05-15 DIAGNOSIS — I5022 Chronic systolic (congestive) heart failure: Secondary | ICD-10-CM | POA: Diagnosis not present

## 2023-05-15 DIAGNOSIS — J441 Chronic obstructive pulmonary disease with (acute) exacerbation: Secondary | ICD-10-CM | POA: Diagnosis not present

## 2023-05-15 DIAGNOSIS — J101 Influenza due to other identified influenza virus with other respiratory manifestations: Secondary | ICD-10-CM | POA: Diagnosis not present

## 2023-05-15 LAB — COMPREHENSIVE METABOLIC PANEL
ALT: 31 U/L (ref 0–44)
AST: 20 U/L (ref 15–41)
Albumin: 2.9 g/dL — ABNORMAL LOW (ref 3.5–5.0)
Alkaline Phosphatase: 29 U/L — ABNORMAL LOW (ref 38–126)
Anion gap: 9 (ref 5–15)
BUN: 27 mg/dL — ABNORMAL HIGH (ref 8–23)
CO2: 29 mmol/L (ref 22–32)
Calcium: 8.9 mg/dL (ref 8.9–10.3)
Chloride: 101 mmol/L (ref 98–111)
Creatinine, Ser: 0.63 mg/dL (ref 0.61–1.24)
GFR, Estimated: >60 mL/min (ref >60)
Glucose, Bld: 141 mg/dL — ABNORMAL HIGH (ref 70–99)
Potassium: 4.8 mmol/L (ref 3.5–5.1)
Sodium: 139 mmol/L (ref 135–145)
Total Bilirubin: 0.8 mg/dL (ref 0.0–1.2)
Total Protein: 5.6 g/dL — ABNORMAL LOW (ref 6.5–8.1)

## 2023-05-15 LAB — CBC WITH DIFFERENTIAL/PLATELET
Abs Immature Granulocytes: 0.08 10*3/uL — ABNORMAL HIGH (ref 0.00–0.07)
Basophils Absolute: 0 10*3/uL (ref 0.0–0.1)
Basophils Relative: 0 %
Eosinophils Absolute: 0 10*3/uL (ref 0.0–0.5)
Eosinophils Relative: 0 %
HCT: 45.9 % (ref 39.0–52.0)
Hemoglobin: 15.2 g/dL (ref 13.0–17.0)
Immature Granulocytes: 1 %
Lymphocytes Relative: 7 %
Lymphs Abs: 0.8 10*3/uL (ref 0.7–4.0)
MCH: 29.4 pg (ref 26.0–34.0)
MCHC: 33.1 g/dL (ref 30.0–36.0)
MCV: 88.8 fL (ref 80.0–100.0)
Monocytes Absolute: 0.9 10*3/uL (ref 0.1–1.0)
Monocytes Relative: 7 %
Neutro Abs: 9.9 10*3/uL — ABNORMAL HIGH (ref 1.7–7.7)
Neutrophils Relative %: 85 %
Platelets: 163 10*3/uL (ref 150–400)
RBC: 5.17 MIL/uL (ref 4.22–5.81)
RDW: 13 % (ref 11.5–15.5)
WBC: 11.6 10*3/uL — ABNORMAL HIGH (ref 4.0–10.5)
nRBC: 0 % (ref 0.0–0.2)

## 2023-05-15 LAB — PHOSPHORUS: Phosphorus: 3.9 mg/dL (ref 2.5–4.6)

## 2023-05-15 LAB — MAGNESIUM: Magnesium: 1.9 mg/dL (ref 1.7–2.4)

## 2023-05-15 MED ORDER — AMIODARONE HCL 200 MG PO TABS
400.0000 mg | ORAL_TABLET | Freq: Two times a day (BID) | ORAL | Status: DC
  Administered 2023-05-15 – 2023-05-16 (×3): 400 mg via ORAL
  Filled 2023-05-15 (×3): qty 2

## 2023-05-15 MED ORDER — LEVALBUTEROL HCL 1.25 MG/0.5ML IN NEBU
INHALATION_SOLUTION | RESPIRATORY_TRACT | Status: AC
  Administered 2023-05-15: 1.25 mg via RESPIRATORY_TRACT
  Filled 2023-05-15: qty 0.5

## 2023-05-15 MED ORDER — IPRATROPIUM BROMIDE 0.02 % IN SOLN
0.5000 mg | Freq: Four times a day (QID) | RESPIRATORY_TRACT | Status: DC
Start: 2023-05-15 — End: 2023-05-16
  Administered 2023-05-15 – 2023-05-16 (×3): 0.5 mg via RESPIRATORY_TRACT
  Filled 2023-05-15 (×4): qty 2.5

## 2023-05-15 MED ORDER — AMIODARONE HCL 200 MG PO TABS: 200.0000 mg | ORAL_TABLET | Freq: Two times a day (BID) | ORAL | Status: DC

## 2023-05-15 MED ORDER — FUROSEMIDE 10 MG/ML IJ SOLN
40.0000 mg | Freq: Once | INTRAMUSCULAR | Status: AC
Start: 2023-05-15 — End: 2023-05-15
  Administered 2023-05-15: 40 mg via INTRAVENOUS
  Filled 2023-05-15: qty 4

## 2023-05-15 MED ORDER — IPRATROPIUM BROMIDE 0.02 % IN SOLN
RESPIRATORY_TRACT | Status: AC
  Administered 2023-05-15: 0.5 mg
  Administered 2023-05-15: 0.5 mg via RESPIRATORY_TRACT
  Filled 2023-05-15: qty 2.5

## 2023-05-15 MED ORDER — LEVALBUTEROL HCL 1.25 MG/0.5ML IN NEBU
1.2500 mg | INHALATION_SOLUTION | Freq: Four times a day (QID) | RESPIRATORY_TRACT | Status: DC
  Administered 2023-05-15 – 2023-05-16 (×4): 1.25 mg via RESPIRATORY_TRACT
  Filled 2023-05-15 (×8): qty 0.5

## 2023-05-15 NOTE — Progress Notes (Signed)
Progress Note    Benjamin Torres  KGM:[id number] DOB: October 10, 1955  DOA: 05/10/2023 PCP: Danella Penton, MD      Brief Narrative:    Medical records reviewed and are as summarized below:  Benjamin Torres is a 68 y.o. 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   He was admitted to the hospital for influenza A infection, COPD exacerbation acute hypoxemic respiratory failure.    Assessment/Plan:   Principal Problem:   COPD exacerbation (HCC) Active Problems:   Influenza A   Chronic systolic CHF (congestive heart failure) (HCC)   CAD (coronary artery disease)   Myocardial injury   Acquired hypothyroidism   Hyperlipidemia   HTN (hypertension)   Tobacco use   COPD with acute exacerbation (HCC)   Paroxysmal atrial fibrillation (HCC)   Persistent atrial fibrillation (HCC)    Body mass index is 22.63 kg/m.   Influenza A infection: Completed Tamiflu.  Antitussives as needed.   COPD exacerbation: Continue IV Solu-Medrol and bronchodilators. Patient has been counseled to stop smoking cigarettes   Acute hypoxemic respiratory failure: He is off of BiPAP.  He is on 2 to 3 L/min oxygen.  Wean off oxygen as able.  Check pulse oximetry with ambulation when able   Paroxysmal atrial fibrillation with RVR: Metoprolol has been increased from 25 to 50 mg for adequate heart rate control.  Continue Eliquis.  Aspirin has been discontinued.   Chronic heart failure with midrange ejection fraction: Compensated.  2D echo in March 2024 showed EF estimated 45 to 50%.   Comorbidities include hypothyroidism, CAD, hyperlipidemia, hypertension, tobacco use  disorder   Encouraged ambulation.   Diet Order             Diet Heart Room service appropriate? Yes; Fluid consistency: Thin  Diet effective now                            Consultants: Cardiologist  Procedures: None    Medications:    ALPRAZolam  0.5 mg Oral BID   [START ON 05/20/2023] amiodarone  200 mg Oral BID   amiodarone  400 mg Oral BID   apixaban  5 mg Oral BID   ezetimibe  10 mg Oral Daily   gabapentin  100 mg Oral Daily   ipratropium  0.5 mg Nebulization Q6H   levalbuterol  1.25 mg Nebulization Q6H   levothyroxine  100 mcg Oral Q0600   methylPREDNISolone (SOLU-MEDROL) injection  40 mg Intravenous Q8H   metoprolol succinate  50 mg Oral Daily   montelukast  10 mg Oral Daily   nicotine  21 mg Transdermal Daily   oseltamivir  75 mg Oral BID   rosuvastatin  10 mg Oral QHS   tamsulosin  0.4 mg Oral Daily   Continuous Infusions:   Anti-infectives (From admission, onward)    Start     Dose/Rate Route Frequency Ordered Stop   05/12/23 1000  azithromycin (ZITHROMAX) tablet 250 mg  Status:  Discontinued       Placed in "Followed by" Linked Group   250 mg Oral Daily 05/10/23 2320 05/10/23 2345   05/11/23 1000  azithromycin (ZITHROMAX) tablet 500 mg  Status:  Discontinued       Placed in "Followed by" Linked Group   500 mg Oral Daily 05/10/23 2320 05/10/23 2345   05/11/23 0015  oseltamivir (TAMIFLU) capsule 75 mg        75 mg Oral 2 times daily 05/11/23 0006 05/15/23 2159              Family Communication/Anticipated D/C date and plan/Code Status   DVT prophylaxis:  apixaban (ELIQUIS) tablet 5 mg     Code Status: Full Code  Family Communication: None Disposition Plan: Plan to discharge home   Status is: Inpatient Remains inpatient appropriate because: COPD exacerbation and respiratory failure       Subjective:   Interval events noted.  He complains of shortness of breath at rest.  He does not want to ambulate to check  pulse oximetry because he said he is even short of breath at rest.  Objective:    Vitals:   05/15/23 0444 05/15/23 0845 05/15/23 0906 05/15/23 1347  BP: 99/63 100/71    Pulse: 90 87  86  Resp: 15 16 18 20   Temp: 98.7 F (37.1 C) 98.4 F (36.9 C)    TempSrc: Oral     SpO2: 99% 97% 96% 96%  Weight:      Height:       No data found.   Intake/Output Summary (Last 24 hours) at 05/15/2023 1555 Last data filed at 05/15/2023 1207 Gross per 24 hour  Intake 240 ml  Output 1900 ml  Net -1660 ml   Filed Weights   05/13/23 0500 05/14/23 0500 05/15/23 0443  Weight: 68 kg 66.5 kg 63.6 kg    Exam:  GEN: NAD SKIN: Warm and dry EYES: No pallor or icterus ENT: MMM CV: RRR PULM: Bilateral wheezing, no rales heard ABD: soft, ND, NT, +BS CNS: AAO x 3, non focal EXT: No edema or tenderness       Data Reviewed:   I have personally reviewed following labs and imaging studies:  Labs: Labs show the following:   Basic Metabolic Panel: Recent Labs  Lab 05/10/23 2106 05/13/23 0606 05/14/23 0617 05/15/23 0552  NA 136 138 139 139  K 4.5 4.7 4.8 4.8  CL 97* 103 103 101  CO2 25 28 29 29   GLUCOSE 112* 148* 139* 141*  BUN 13 37* 29* 27*  CREATININE 0.92 0.93 0.64 0.63  CALCIUM 9.3 8.7* 9.3 8.9  MG  --  2.3 2.1 1.9  PHOS  --  4.2 3.9 3.9   GFR Estimated Creatinine Clearance: 79.5 mL/min (by C-G formula based on SCr of 0.63 mg/dL). Liver Function Tests: Recent Labs  Lab 05/13/23 0606 05/14/23 0617 05/15/23 0552  AST 26 24 20   ALT 25 29 31   ALKPHOS 32* 31* 29*  BILITOT 0.5 0.8 0.8  PROT 6.0* 5.8* 5.6*  ALBUMIN 3.1* 3.0* 2.9*   No results for input(s): "LIPASE", "AMYLASE" in the last 168 hours. No results for input(s): "AMMONIA" in the last 168 hours. Coagulation profile No results for input(s): "INR", "PROTIME" in the last 168 hours.  CBC: Recent Labs  Lab 05/10/23 1856 05/13/23 0606 05/14/23 0617 05/15/23 0552  WBC 11.5* 16.5* 14.4* 11.6*  NEUTROABS  --   14.8* 12.6* 9.9*  HGB 16.8 15.2 15.2 15.2  HCT 51.7 46.6 47.2 45.9  MCV 90.1 89.8 90.8 88.8  PLT 180 184 179 163   Cardiac Enzymes: No results for input(s): "CKTOTAL", "CKMB", "CKMBINDEX", "TROPONINI" in the last 168 hours. BNP (last 3  results) No results for input(s): "PROBNP" in the last 8760 hours. CBG: No results for input(s): "GLUCAP" in the last 168 hours. D-Dimer: No results for input(s): "DDIMER" in the last 72 hours. Hgb A1c: No results for input(s): "HGBA1C" in the last 72 hours. Lipid Profile: No results for input(s): "CHOL", "HDL", "LDLCALC", "TRIG", "CHOLHDL", "LDLDIRECT" in the last 72 hours. Thyroid function studies: No results for input(s): "TSH", "T4TOTAL", "T3FREE", "THYROIDAB" in the last 72 hours.  Invalid input(s): "FREET3" Anemia work up: No results for input(s): "VITAMINB12", "FOLATE", "FERRITIN", "TIBC", "IRON", "RETICCTPCT" in the last 72 hours. Sepsis Labs: Recent Labs  Lab 05/10/23 1856 05/13/23 0606 05/14/23 0617 05/15/23 0552  WBC 11.5* 16.5* 14.4* 11.6*    Microbiology Recent Results (from the past 240 hours)  Resp panel by RT-PCR (RSV, Flu A&B, Covid) Anterior Nasal Swab     Status: Abnormal   Collection Time: 05/10/23  8:54 PM   Specimen: Anterior Nasal Swab  Result Value Ref Range Status   SARS Coronavirus 2 by RT PCR NEGATIVE NEGATIVE Final    Comment: (NOTE) SARS-CoV-2 target nucleic acids are NOT DETECTED.  The SARS-CoV-2 RNA is generally detectable in upper respiratory specimens during the acute phase of infection. The lowest concentration of SARS-CoV-2 viral copies this assay can detect is 138 copies/mL. A negative result does not preclude SARS-Cov-2 infection and should not be used as the sole basis for treatment or other patient management decisions. A negative result may occur with  improper specimen collection/handling, submission of specimen other than nasopharyngeal swab, presence of viral mutation(s) within the areas  targeted by this assay, and inadequate number of viral copies(<138 copies/mL). A negative result must be combined with clinical observations, patient history, and epidemiological information. The expected result is Negative.  Fact Sheet for Patients:  BloggerCourse.com  Fact Sheet for Healthcare Providers:  SeriousBroker.it  This test is no t yet approved or cleared by the [place] FDA and  has been authorized for detection and/or diagnosis of SARS-CoV-2 by FDA under an Emergency Use Authorization (EUA). This EUA will remain  in effect (meaning this test can be used) for the duration of the COVID-19 declaration under Section 564(b)(1) of the Act, 21 U.S.C.section 360bbb-3(b)(1), unless the authorization is terminated  or revoked sooner.       Influenza A by PCR POSITIVE (A) NEGATIVE Final   Influenza B by PCR NEGATIVE NEGATIVE Final    Comment: (NOTE) The Xpert Xpress SARS-CoV-2/FLU/RSV plus assay is intended as an aid in the diagnosis of influenza from Nasopharyngeal swab specimens and should not be used as a sole basis for treatment. Nasal washings and aspirates are unacceptable for Xpert Xpress SARS-CoV-2/FLU/RSV testing.  Fact Sheet for Patients: BloggerCourse.com  Fact Sheet for Healthcare Providers: SeriousBroker.it  This test is not yet approved or cleared by the [place] FDA and has been authorized for detection and/or diagnosis of SARS-CoV-2 by FDA under an Emergency Use Authorization (EUA). This EUA will remain in effect (meaning this test can be used) for the duration of the COVID-19 declaration under Section 564(b)(1) of the Act, 21 U.S.C. section 360bbb-3(b)(1), unless the authorization is terminated or revoked.     Resp Syncytial Virus by PCR NEGATIVE NEGATIVE Final    Comment: (NOTE) Fact Sheet for  Patients: BloggerCourse.com  Fact Sheet for Healthcare Providers: SeriousBroker.it  This test is not yet approved or cleared by the [place] FDA and has been authorized for detection and/or diagnosis of SARS-CoV-2 by FDA under an Emergency Use  Authorization (EUA). This EUA will remain in effect (meaning this test can be used) for the duration of the COVID-19 declaration under Section 564(b)(1) of the Act, 21 U.S.C. section 360bbb-3(b)(1), unless the authorization is terminated or revoked.  Performed at [facility], [address] [place], [place] [address]     Procedures and diagnostic studies:  No results found.              LOS: 4 days   Fairley Copher  [facility]   Pager on [email or web address]. If 7PM-7AM, please contact night-coverage at [email or web address]     05/15/2023, 3:55 PM

## 2023-05-15 NOTE — Progress Notes (Addendum)
Rounding Note    Patient Name: Benjamin Torres Date of Encounter: 05/15/2023  [facility] Cardiologist: Julien Nordmann, MD   Subjective   Reports he is having difficulty breathing this morning In a bad mood, " this hospital is terrible" I did discuss with him that he has COPD exacerbation with influenza Atrial fibrillation has converted to normal sinus rhythm this morning 5:50 AM with PVCs and trigeminal pattern  Inpatient Medications    Scheduled Meds:  ALPRAZolam  0.5 mg Oral BID   apixaban  5 mg Oral BID   ezetimibe  10 mg Oral Daily   gabapentin  100 mg Oral Daily   ipratropium  0.5 mg Nebulization Q6H   levalbuterol  1.25 mg Nebulization Q6H   levothyroxine  100 mcg Oral Q0600   losartan  25 mg Oral Daily   methylPREDNISolone (SOLU-MEDROL) injection  40 mg Intravenous Q8H   metoprolol succinate  50 mg Oral Daily   montelukast  10 mg Oral Daily   nicotine  21 mg Transdermal Daily   oseltamivir  75 mg Oral BID   rosuvastatin  10 mg Oral QHS   tamsulosin  0.4 mg Oral Daily   Continuous Infusions:  PRN Meds: acetaminophen, dextromethorphan-guaiFENesin, famotidine, hydrALAZINE, hydrOXYzine, ondansetron (ZOFRAN) IV   Vital Signs    Vitals:   05/15/23 0443 05/15/23 0444 05/15/23 0845 05/15/23 0906  BP:  99/63 100/71   Pulse:  90 87   Resp:  15 16 18   Temp:  98.7 F (37.1 C) 98.4 F (36.9 C)   TempSrc:  Oral    SpO2:  99% 97% 96%  Weight: 63.6 kg     Height:        Intake/Output Summary (Last 24 hours) at 05/15/2023 1121 Last data filed at 05/15/2023 0249 Gross per 24 hour  Intake --  Output 1000 ml  Net -1000 ml      05/15/2023    4:43 AM 05/14/2023    5:00 AM 05/13/2023    5:00 AM  Last 3 Weights  Weight (lbs) 140 lb 3.4 oz 146 lb 9.7 oz 149 lb 14.6 oz  Weight (kg) 63.6 kg 66.5 kg 68 kg      Telemetry    Atrial fibrillation converting to normal sinus rhythm 5:50 AM with PVCs and trigeminal pattern- Personally Reviewed  ECG     -  Personally Reviewed  Physical Exam   GEN: No acute distress.   Neck: No JVD Cardiac: RRR with ectopy, no murmurs, rubs, or gallops.  Respiratory: Clear to auscultation bilaterally. GI: Soft, nontender, non-distended  MS: No edema; No deformity. Neuro:  Nonfocal  Psych: Normal affect   Labs    High Sensitivity Troponin:   Recent Labs  Lab 05/10/23 1856 05/10/23 2106 05/11/23 0352 05/13/23 0606  TROPONINIHS 13 21* 34* 17     Chemistry Recent Labs  Lab 05/13/23 0606 05/14/23 0617 05/15/23 0552  NA 138 139 139  K 4.7 4.8 4.8  CL 103 103 101  CO2 28 29 29   GLUCOSE 148* 139* 141*  BUN 37* 29* 27*  CREATININE 0.93 0.64 0.63  CALCIUM 8.7* 9.3 8.9  MG 2.3 2.1 1.9  PROT 6.0* 5.8* 5.6*  ALBUMIN 3.1* 3.0* 2.9*  AST 26 24 20   ALT 25 29 31   ALKPHOS 32* 31* 29*  BILITOT 0.5 0.8 0.8  GFRNONAA >60 >60 >60  ANIONGAP 7 7 9     Lipids  Recent Labs  Lab 05/11/23 0352  CHOL 80  TRIG 45  HDL 45  LDLCALC 26  CHOLHDL 1.8    Hematology Recent Labs  Lab 05/13/23 0606 05/14/23 0617 05/15/23 0552  WBC 16.5* 14.4* 11.6*  RBC 5.19 5.20 5.17  HGB 15.2 15.2 15.2  HCT 46.6 47.2 45.9  MCV 89.8 90.8 88.8  MCH 29.3 29.2 29.4  MCHC 32.6 32.2 33.1  RDW 13.2 13.2 13.0  PLT 184 179 163   Thyroid No results for input(s): "TSH", "FREET4" in the last 168 hours.  BNP Recent Labs  Lab 05/10/23 2335  BNP 137.8*    DDimer No results for input(s): "DDIMER" in the last 168 hours.   Radiology    No results found.  Cardiac Studies     Patient Profile     Benjamin Torres is a 68 y.o. 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   Assessment & Plan    Persistent atrial fibrillation Prior history of  atrial fibrillation previously noted April 2022 in the setting of COPD exacerbation and pneumonia spontaneously converting to normal sinus rhythm Presenting this admission in sinus rhythm converting to atrial fibs/flutter with frequent PVCs -Started on Eliquis 5 twice daily January 14 Recommend we increase metoprolol succinate up to 50 twice daily for rate control, decrease losartan down to 25 daily -Given high risk of recurrent arrhythmia, frequent PVCs, paroxysmal nature of his atrial fibrillation, hypotension limiting increase of rate controlling medication, we will add amiodarone 400 twice daily 5 days down to 200 twice daily -Outpatient follow-up for management of his atrial fibrillation -Would hope to stop amiodarone as outpatient in the setting of underlying lung disease after COPD exacerbation/influenza runs its course   COPD exacerbation with influenza A Long history of smoking, no plan to quit On 3 L nasal cannula oxygen -Supportive management -Requiring Xopenex nebulizers Would likely require outpatient oxygen given desaturations with ambulation   Chronic diastolic CHF In the setting of atrial fibrillation Continue metoprolol succinate 50 daily Losartan on hold in the setting of hypotension -Rhythm control strategy -Will give dose IV Lasix 40 x 1  [facility] will sign off.   Medication Recommendations: Medications as above Other recommendations (labs, testing, etc): No further testing Follow up as an outpatient: Outpatient follow-up in cardiology clinic   For questions or updates, please contact [facility] Please consult [email or web address] for contact info under        Signed, Julien Nordmann, MD  05/15/2023, 11:21 AM

## 2023-05-15 NOTE — Progress Notes (Addendum)
Mobility Specialist - Progress Note  Pre-mobility: HR 89, SpO2 97% During mobility: HR 106, SpO2 76% Post-mobility: HR 102, SPO2 98%   05/15/23 1513  Mobility  Activity Ambulated with assistance in room;Dangled on edge of bed  Level of Assistance Contact guard assist, steadying assist  Assistive Device Front wheel walker  Distance Ambulated (ft) 20 ft  Activity Response Tolerated well  Mobility visit 1 Mobility  Mobility Specialist Start Time (ACUTE ONLY) 1430  Mobility Specialist Stop Time (ACUTE ONLY) 1445  Mobility Specialist Time Calculation (min) (ACUTE ONLY) 15 min   Nurse requested Mobility Specialist to perform oxygen saturation test with pt which includes removing pt from oxygen both at rest and while ambulating.  Below are the results from that testing.     Patient Saturations on Room Air at Rest = spO2 97%  Patient Saturations on Room Air while Ambulating = sp02 76%   Patient Saturations on 2 Liters of oxygen while Ambulating = sp02 94%  At end of testing pt left in room on 3  Liters of oxygen.  Reported results to nurse.   Pt dangling EOB upon arrival utilizing 3L [place]. Pt removed from [place], O2 >90%. Pt STS to RW MinA, and amb to/from the door within the room with CGA-- min wheezing noted during amb. During amb Pt destat to 76% unable to rise above 88% with pursed lip breathing. Pt placed on 2L, within one minute Pt O2 saturated to 94%. Pt returned EOB, placed on 3L [place] --- O2 >90%. Pt left seated EOB with alarm set and needs within reach. RN notified.  Zetta Bills Mobility Specialist 05/15/23 3:39 PM

## 2023-05-15 NOTE — Telephone Encounter (Signed)
Patient Product/process development scientist completed.    The patient is insured through [employer]. Patient has Medicare and is not eligible for a copay card, but may be able to apply for patient assistance or Medicare RX Payment Plan (Patient Must reach out to their plan, if eligible for payment plan), if available.    Ran test claim for Eliquis 5 mg and the current 30 day co-pay is $297.00 due to a $250.00 deductible.  Will be $47.00 once deductible is met.   This test claim was processed through [facility]- copay amounts may vary at other pharmacies due to pharmacy/plan contracts, or as the patient moves through the different stages of their insurance plan.     Roland Earl, CPHT Pharmacy Technician III Certified Patient Advocate [facility] Patient Advocate Team Direct Number: [phone]  Fax: [phone]

## 2023-05-16 DIAGNOSIS — J441 Chronic obstructive pulmonary disease with (acute) exacerbation: Secondary | ICD-10-CM | POA: Diagnosis not present

## 2023-05-16 LAB — CBC WITH DIFFERENTIAL/PLATELET
Abs Immature Granulocytes: 0.04 10*3/uL (ref 0.00–0.07)
Basophils Absolute: 0 10*3/uL (ref 0.0–0.1)
Basophils Relative: 0 %
Eosinophils Absolute: 0 10*3/uL (ref 0.0–0.5)
Eosinophils Relative: 0 %
HCT: 49.9 % (ref 39.0–52.0)
Hemoglobin: 16.1 g/dL (ref 13.0–17.0)
Immature Granulocytes: 0 %
Lymphocytes Relative: 8 %
Lymphs Abs: 0.9 10*3/uL (ref 0.7–4.0)
MCH: 29.3 pg (ref 26.0–34.0)
MCHC: 32.3 g/dL (ref 30.0–36.0)
MCV: 90.7 fL (ref 80.0–100.0)
Monocytes Absolute: 1 10*3/uL (ref 0.1–1.0)
Monocytes Relative: 8 %
Neutro Abs: 9.9 10*3/uL — ABNORMAL HIGH (ref 1.7–7.7)
Neutrophils Relative %: 84 %
Platelets: 172 10*3/uL (ref 150–400)
RBC: 5.5 MIL/uL (ref 4.22–5.81)
RDW: 12.7 % (ref 11.5–15.5)
WBC: 11.8 10*3/uL — ABNORMAL HIGH (ref 4.0–10.5)
nRBC: 0 % (ref 0.0–0.2)

## 2023-05-16 LAB — COMPREHENSIVE METABOLIC PANEL
ALT: 63 U/L — ABNORMAL HIGH (ref 0–44)
AST: 36 U/L (ref 15–41)
Albumin: 3.2 g/dL — ABNORMAL LOW (ref 3.5–5.0)
Alkaline Phosphatase: 34 U/L — ABNORMAL LOW (ref 38–126)
Anion gap: 8 (ref 5–15)
BUN: 32 mg/dL — ABNORMAL HIGH (ref 8–23)
CO2: 35 mmol/L — ABNORMAL HIGH (ref 22–32)
Calcium: 9.1 mg/dL (ref 8.9–10.3)
Chloride: 97 mmol/L — ABNORMAL LOW (ref 98–111)
Creatinine, Ser: 0.76 mg/dL (ref 0.61–1.24)
GFR, Estimated: >60 mL/min (ref >60)
Glucose, Bld: 120 mg/dL — ABNORMAL HIGH (ref 70–99)
Potassium: 4.9 mmol/L (ref 3.5–5.1)
Sodium: 140 mmol/L (ref 135–145)
Total Bilirubin: 1.1 mg/dL (ref 0.0–1.2)
Total Protein: 5.9 g/dL — ABNORMAL LOW (ref 6.5–8.1)

## 2023-05-16 LAB — MAGNESIUM: Magnesium: 2.1 mg/dL (ref 1.7–2.4)

## 2023-05-16 LAB — PHOSPHORUS: Phosphorus: 3.9 mg/dL (ref 2.5–4.6)

## 2023-05-16 MED ORDER — METOPROLOL SUCCINATE ER 50 MG PO TB24: 50.0000 mg | ORAL_TABLET | Freq: Every day | ORAL | 0 refills | Status: DC

## 2023-05-16 MED ORDER — APIXABAN 5 MG PO TABS: 5.0000 mg | ORAL_TABLET | Freq: Two times a day (BID) | ORAL | 0 refills | Status: AC

## 2023-05-16 MED ORDER — LEVALBUTEROL HCL 1.25 MG/0.5ML IN NEBU
1.2500 mg | INHALATION_SOLUTION | Freq: Three times a day (TID) | RESPIRATORY_TRACT | Status: DC
  Administered 2023-05-16: 1.25 mg via RESPIRATORY_TRACT
  Filled 2023-05-16 (×2): qty 0.5

## 2023-05-16 MED ORDER — AMIODARONE HCL 200 MG PO TABS: 400.0000 mg | ORAL_TABLET | Freq: Two times a day (BID) | ORAL | 0 refills | Status: DC

## 2023-05-16 MED ORDER — IPRATROPIUM BROMIDE 0.02 % IN SOLN
0.5000 mg | Freq: Three times a day (TID) | RESPIRATORY_TRACT | Status: DC
  Administered 2023-05-16: 0.5 mg via RESPIRATORY_TRACT
  Filled 2023-05-16: qty 2.5

## 2023-05-16 MED ORDER — ACETAMINOPHEN 325 MG PO TABS: 650.0000 mg | ORAL_TABLET | Freq: Four times a day (QID) | ORAL | Status: AC | PRN

## 2023-05-16 MED ORDER — AMIODARONE HCL 200 MG PO TABS: 200.0000 mg | ORAL_TABLET | Freq: Two times a day (BID) | ORAL | 0 refills | Status: DC

## 2023-05-16 MED ORDER — ALBUTEROL SULFATE (2.5 MG/3ML) 0.083% IN NEBU: 2.5000 mg | INHALATION_SOLUTION | Freq: Four times a day (QID) | RESPIRATORY_TRACT | 0 refills | Status: AC | PRN

## 2023-05-16 MED ORDER — NICOTINE 21 MG/24HR TD PT24: 21.0000 mg | MEDICATED_PATCH | Freq: Every day | TRANSDERMAL | Status: AC

## 2023-05-16 NOTE — Plan of Care (Signed)

## 2023-05-16 NOTE — TOC CM/SW Note (Signed)
Transition of Care [facility]) - Inpatient Brief Assessment   Patient Details  Name: Benjamin Torres MRN: [id number] Date of Birth: 1955/07/14  [facility] of [facility] CM/SW Contact:    Chapman Fitch, RN Phone Number: 05/16/2023, 11:29 AM   Clinical Narrative:   Patient to discharge today Patient qualifies for home O2.  Patient states that he does not have a preference of home health agency.  Referral made to [facility] with [employer] for O2, and nebulizer.  Patient states that his sister will provide transport at discharge.  Patient declines need for home health RN at discharge     Transition of Care Asessment: Insurance and Status: Insurance coverage has been reviewed Patient has primary care physician: Yes Home environment has been reviewed: Single family home Prior level of function:: Not documented Prior/Current Home Services: No current home services Social Drivers of Health Review: SDOH reviewed no interventions necessary Readmission risk has been reviewed: Yes Transition of care needs: transition of care needs identified, TOC will continue to follow

## 2023-05-16 NOTE — Discharge Summary (Signed)
Physician Discharge Summary   Patient: Benjamin Torres MRN: [id number] DOB: [birth year]  Admit date:     05/10/2023  Discharge date: {dischdate:26783}  Discharge Physician: Lurene Shadow   PCP: Danella Penton, MD   Recommendations at discharge:  {Tip this will not be part of the note when signed- Example include specific recommendations for outpatient follow-up, pending tests to follow-up on. (Optional):26781}  ***  Discharge Diagnoses: Principal Problem:   COPD exacerbation (HCC) Active Problems:   Influenza A   Chronic systolic CHF (congestive heart failure) (HCC)   CAD (coronary artery disease)   Myocardial injury   Acquired hypothyroidism   Hyperlipidemia   HTN (hypertension)   Tobacco use   COPD with acute exacerbation (HCC)   Paroxysmal atrial fibrillation (HCC)   Persistent atrial fibrillation (HCC)  Resolved Problems:   * No resolved hospital problems. [facility] Course: No notes on file  Assessment and Plan: No notes have been filed under this hospital service. Service: Hospitalist     {Tip this will not be part of the note when signed Body mass index is 22.63 kg/m. , ,  (Optional):26781}  {(NOTE) Pain control PDMP Statment (Optional):26782} Consultants: *** Procedures performed: ***  Disposition: {Plan; Disposition:26390} Diet recommendation:  Discharge Diet Orders (From admission, onward)     Start     Ordered   05/16/23 0000  Diet - low sodium heart healthy        05/16/23 1010           {Diet_Plan:26776} DISCHARGE MEDICATION: Allergies as of 05/16/2023   No Known Allergies      Medication List     STOP taking these medications    acetaminophen-codeine 300-30 MG tablet Commonly known as: TYLENOL #3   ibuprofen 200 MG tablet Commonly known as: ADVIL       TAKE these medications    acetaminophen 325 MG tablet Commonly known as: TYLENOL Take 2 tablets (650 mg total) by mouth every 6 (six) hours as needed for mild pain (pain  score 1-3).   albuterol 108 (90 Base) MCG/ACT inhaler Commonly known as: VENTOLIN HFA Inhale 1 puff into the lungs every 4 (four) hours as needed for wheezing or shortness of breath.   ALPRAZolam 0.5 MG tablet Commonly known as: XANAX Take 0.5 mg by mouth 2 (two) times daily.   amiodarone 200 MG tablet Commonly known as: PACERONE Take 2 tablets (400 mg total) by mouth 2 (two) times daily for 5 days.   amiodarone 200 MG tablet Commonly known as: PACERONE Take 1 tablet (200 mg total) by mouth 2 (two) times daily. Start taking on: May 20, 2023   apixaban 5 MG Tabs tablet Commonly known as: ELIQUIS Take 1 tablet (5 mg total) by mouth 2 (two) times daily.   Breztri Aerosphere 160-9-4.8 MCG/ACT Aero Generic drug: Budeson-Glycopyrrol-Formoterol Inhale 2 puffs into the lungs in the morning and at bedtime.   cyanocobalamin 1000 MCG/ML injection Commonly known as: VITAMIN B12 Inject 1,000 mcg into the muscle every 30 (thirty) days.   ezetimibe 10 MG tablet Commonly known as: ZETIA Take 1 tablet (10 mg total) by mouth daily.   gabapentin 100 MG capsule Commonly known as: NEURONTIN Take 100 mg by mouth daily.   levothyroxine 100 MCG tablet Commonly known as: SYNTHROID Take 100 mcg by mouth daily before breakfast.   losartan 50 MG tablet Commonly known as: COZAAR Take 50 mg by mouth daily.   metoprolol succinate 50 MG 24 hr tablet Commonly known  as: TOPROL-XL Take 1 tablet (50 mg total) by mouth daily. What changed:  medication strength how much to take   montelukast 10 MG tablet Commonly known as: SINGULAIR Take 10 mg by mouth daily.   nicotine 21 mg/24hr patch Commonly known as: NICODERM CQ - dosed in mg/24 hours Place 1 patch (21 mg total) onto the skin daily. Start taking on: May 17, 2023   rosuvastatin 10 MG tablet Commonly known as: CRESTOR Take 10 mg by mouth at bedtime.   tamsulosin 0.4 MG Caps capsule Commonly known as: FLOMAX Take 1 capsule  (0.4 mg total) by mouth daily.               Durable Medical Equipment  (From admission, onward)           Start     Ordered   05/16/23 1009  DME Oxygen  Once       Question Answer Comment  Length of Need Lifetime   Mode or (Route) Nasal cannula   Liters per Minute 2   Frequency Continuous (stationary and portable oxygen unit needed)   Oxygen conserving device Yes   Oxygen delivery system Gas      05/16/23 1010            Discharge Exam: Filed Weights   05/14/23 0500 05/15/23 0443 05/16/23 0405  Weight: 66.5 kg 63.6 kg 63.6 kg   ***  Condition at discharge: {DC Condition:26389}  The results of significant diagnostics from this hospitalization (including imaging, microbiology, ancillary and laboratory) are listed below for reference.   Imaging Studies: ECHOCARDIOGRAM COMPLETE Result Date: 05/13/2023    ECHOCARDIOGRAM REPORT   Patient Name:   Benjamin Torres Date of Exam: 05/12/2023 Medical Rec #:  [id number]        Height:       66.0 in Accession #:    [id number]       Weight:       155.0 lb Date of Birth:  Mar 11, 1956         BSA:          1.794 m Patient Age:    67 years         BP:           130/102 mmHg Patient Gender: M                HR:           92 bpm. Exam Location:  [facility] Procedure: 2D Echo, Cardiac Doppler, Color Doppler and Intracardiac            Opacification Agent Indications:     Atrial Fibrillation  History:         Patient has prior history of Echocardiogram examinations, most                  recent 07/25/2022. CHF, CAD, COPD, Arrythmias:Atrial                  Fibrillation, Signs/Symptoms:Shortness of Breath; Risk                  Factors:Hypertension, Dyslipidemia and Current Smoker. FLU +.  Sonographer:     Mikki Harbor Referring Phys:  [id number] Debarah Crape Diagnosing Phys: Yvonne Kendall MD  Sonographer Comments: Technically difficult study due to poor echo windows and no subcostal window. Image acquisition challenging due to COPD and Image  acquisition challenging due to respiratory motion. IMPRESSIONS  1. Left ventricular ejection fraction, by estimation, is  55 to 60%. The left ventricle has normal function. Left ventricular endocardial border not optimally defined to evaluate regional wall motion. Left ventricular diastolic parameters are indeterminate.  2. Right ventricular systolic function is mildly reduced. The right ventricular size is moderately enlarged.  3. The mitral valve was not well visualized. No evidence of mitral valve regurgitation. No evidence of mitral stenosis.  4. The aortic valve was not well visualized. Aortic valve regurgitation is not visualized. No aortic stenosis is present. FINDINGS  Left Ventricle: Left ventricular ejection fraction, by estimation, is 55 to 60%. The left ventricle has normal function. Left ventricular endocardial border not optimally defined to evaluate regional wall motion. Definity contrast agent was given IV to delineate the left ventricular endocardial borders. The left ventricular internal cavity size was normal in size. There is no left ventricular hypertrophy. Left ventricular diastolic parameters are indeterminate. Right Ventricle: Pulmonary artery pressure is upper normal to mildly elevated (RVSP 30 mmHg plus central venous/right atrial pressure). The right ventricular size is moderately enlarged. No increase in right ventricular wall thickness. Right ventricular systolic function is mildly reduced. Left Atrium: Left atrial size was normal in size. Right Atrium: Right atrial size was normal in size. Pericardium: Trivial pericardial effusion is present. Mitral Valve: The mitral valve was not well visualized. No evidence of mitral valve regurgitation. No evidence of mitral valve stenosis. MV peak gradient, 2.5 mmHg. The mean mitral valve gradient is 1.0 mmHg. Tricuspid Valve: The tricuspid valve is not well visualized. Tricuspid valve regurgitation is trivial. Aortic Valve: The aortic valve was not  well visualized. Aortic valve regurgitation is not visualized. No aortic stenosis is present. Pulmonic Valve: The pulmonic valve was not well visualized. Pulmonic valve regurgitation is not visualized. No evidence of pulmonic stenosis. Aorta: The aortic root is normal in size and structure. Pulmonary Artery: The pulmonary artery is not well seen. Venous: The inferior vena cava was not well visualized. IAS/Shunts: The interatrial septum was not well visualized.  LEFT VENTRICLE PLAX 2D LVIDd:         4.40 cm LVIDs:         3.00 cm LV PW:         1.00 cm LV IVS:        1.00 cm LVOT diam:     2.00 cm LV SV:         44 LV SV Index:   25 LVOT Area:     3.14 cm  RIGHT VENTRICLE RV Basal diam:  4.05 cm RV Mid diam:    3.10 cm LEFT ATRIUM           Index        RIGHT ATRIUM           Index LA diam:      3.50 cm 1.95 cm/m   RA Area:     17.20 cm LA Vol (A4C): 29.4 ml 16.38 ml/m  RA Volume:   45.00 ml  25.08 ml/m  AORTIC VALVE LVOT Vmax:   71.70 cm/s LVOT Vmean:  50.600 cm/s LVOT VTI:    0.141 m  AORTA Ao Root diam: 3.20 cm MITRAL VALVE               TRICUSPID VALVE MV Area (PHT): 7.90 cm    TR Peak grad:   29.8 mmHg MV Area VTI:   5.76 cm    TR Vmax:        273.00 cm/s MV Peak grad:  2.5 mmHg MV Mean  grad:  1.0 mmHg    SHUNTS MV Vmax:       0.79 m/s    Systemic VTI:  0.14 m MV Vmean:      44.9 cm/s   Systemic Diam: 2.00 cm MV Decel Time: 96 msec MV E velocity: 60.70 cm/s Yvonne Kendall MD Electronically signed by Yvonne Kendall MD Signature Date/Time: 05/13/2023/3:10:21 PM    Final    CT Angio Chest PE W and/or Wo Contrast Result Date: 05/10/2023 CLINICAL DATA:  Shortness of breath EXAM: CT ANGIOGRAPHY CHEST WITH CONTRAST TECHNIQUE: Multidetector CT imaging of the chest was performed using the standard protocol during bolus administration of intravenous contrast. Multiplanar CT image reconstructions and MIPs were obtained to evaluate the vascular anatomy. RADIATION DOSE REDUCTION: This exam was performed according  to the departmental dose-optimization program which includes automated exposure control, adjustment of the mA and/or kV according to patient size and/or use of iterative reconstruction technique. CONTRAST:  80mL OMNIPAQUE IOHEXOL 350 MG/ML SOLN COMPARISON:  Chest radiograph dated 05/10/2023. CTA chest dated 01/02/2023. FINDINGS: Cardiovascular: Satisfactory opacification of the bilateral pulmonary arteries to the segmental level. No evidence of pulmonary embolism Although not tailored for evaluation of the thoracic aorta, there is no evidence of thoracic aortic aneurysm or dissection. Mild atherosclerotic calcifications of the aortic arch. Moderate three-vessel coronary atherosclerosis. Mediastinum/Nodes: No suspicious mediastinal lymphadenopathy. Visualized thyroid is unremarkable. Lungs/Pleura: Moderate centrilobular and paraseptal emphysematous changes, upper lung predominant. Mild linear scarring in the right lower lobe. Nodular scarring in the posterior left upper lobe (series 5/image 29), chronic. Additional scattered chronic small subpleural nodules in the lungs bilaterally, measuring up to 5 mm in the left upper lobe (series 5/image 85), benign. No follow-up is recommended per Fleischner Society guidelines. No new/suspicious pulmonary nodules. No pleural effusion pneumothorax. Upper Abdomen: No visualized upper abdomen is notable for a small hiatal hernia, cholelithiasis, pole renal calculus, and vascular calcifications. A 3 mm nonobstructing left lower Musculoskeletal: Mild degenerative changes of the upper lumbar spine. Review of the MIP images confirms the above findings. IMPRESSION: No evidence of pulmonary embolism. No acute cardiopulmonary disease. Aortic Atherosclerosis (ICD10-I70.0) and Emphysema (ICD10-J43.9). Electronically Signed   By: Charline Bills M.D.   On: 05/10/2023 22:31   DG Chest Port 1 View Result Date: 05/10/2023 CLINICAL DATA:  Shortness of breath EXAM: PORTABLE CHEST 1 VIEW  COMPARISON:  CTA chest dated 01/02/2023 FINDINGS: Lungs are clear.  No pleural effusion or pneumothorax. The heart is normal in size. IMPRESSION: No acute cardiopulmonary disease. Electronically Signed   By: Charline Bills M.D.   On: 05/10/2023 19:49    Microbiology: Results for orders placed or performed during the hospital encounter of 05/10/23  Resp panel by RT-PCR (RSV, Flu A&B, Covid) Anterior Nasal Swab     Status: Abnormal   Collection Time: 05/10/23  8:54 PM   Specimen: Anterior Nasal Swab  Result Value Ref Range Status   SARS Coronavirus 2 by RT PCR NEGATIVE NEGATIVE Final    Comment: (NOTE) SARS-CoV-2 target nucleic acids are NOT DETECTED.  The SARS-CoV-2 RNA is generally detectable in upper respiratory specimens during the acute phase of infection. The lowest concentration of SARS-CoV-2 viral copies this assay can detect is 138 copies/mL. A negative result does not preclude SARS-Cov-2 infection and should not be used as the sole basis for treatment or other patient management decisions. A negative result may occur with  improper specimen collection/handling, submission of specimen other than nasopharyngeal swab, presence of viral mutation(s) within the areas targeted  by this assay, and inadequate number of viral copies(<138 copies/mL). A negative result must be combined with clinical observations, patient history, and epidemiological information. The expected result is Negative.  Fact Sheet for Patients:  BloggerCourse.com  Fact Sheet for Healthcare Providers:  SeriousBroker.it  This test is no t yet approved or cleared by the [place] FDA and  has been authorized for detection and/or diagnosis of SARS-CoV-2 by FDA under an Emergency Use Authorization (EUA). This EUA will remain  in effect (meaning this test can be used) for the duration of the COVID-19 declaration under Section 564(b)(1) of the Act,  21 U.S.C.section 360bbb-3(b)(1), unless the authorization is terminated  or revoked sooner.       Influenza A by PCR POSITIVE (A) NEGATIVE Final   Influenza B by PCR NEGATIVE NEGATIVE Final    Comment: (NOTE) The Xpert Xpress SARS-CoV-2/FLU/RSV plus assay is intended as an aid in the diagnosis of influenza from Nasopharyngeal swab specimens and should not be used as a sole basis for treatment. Nasal washings and aspirates are unacceptable for Xpert Xpress SARS-CoV-2/FLU/RSV testing.  Fact Sheet for Patients: BloggerCourse.com  Fact Sheet for Healthcare Providers: SeriousBroker.it  This test is not yet approved or cleared by the [place] FDA and has been authorized for detection and/or diagnosis of SARS-CoV-2 by FDA under an Emergency Use Authorization (EUA). This EUA will remain in effect (meaning this test can be used) for the duration of the COVID-19 declaration under Section 564(b)(1) of the Act, 21 U.S.C. section 360bbb-3(b)(1), unless the authorization is terminated or revoked.     Resp Syncytial Virus by PCR NEGATIVE NEGATIVE Final    Comment: (NOTE) Fact Sheet for Patients: BloggerCourse.com  Fact Sheet for Healthcare Providers: SeriousBroker.it  This test is not yet approved or cleared by the [place] FDA and has been authorized for detection and/or diagnosis of SARS-CoV-2 by FDA under an Emergency Use Authorization (EUA). This EUA will remain in effect (meaning this test can be used) for the duration of the COVID-19 declaration under Section 564(b)(1) of the Act, 21 U.S.C. section 360bbb-3(b)(1), unless the authorization is terminated or revoked.  Performed at [facility], [address] [place], [place] [address]     Labs: CBC: Recent Labs  Lab 05/10/23 1856 05/13/23 0606 05/14/23 0617 05/15/23 0552 05/16/23 0752  WBC 11.5* 16.5*  14.4* 11.6* 11.8*  NEUTROABS  --  14.8* 12.6* 9.9* 9.9*  HGB 16.8 15.2 15.2 15.2 16.1  HCT 51.7 46.6 47.2 45.9 49.9  MCV 90.1 89.8 90.8 88.8 90.7  PLT 180 184 179 163 172   Basic Metabolic Panel: Recent Labs  Lab 05/10/23 2106 05/13/23 0606 05/14/23 0617 05/15/23 0552 05/16/23 0752  NA 136 138 139 139 140  K 4.5 4.7 4.8 4.8 4.9  CL 97* 103 103 101 97*  CO2 25 28 29 29  35*  GLUCOSE 112* 148* 139* 141* 120*  BUN 13 37* 29* 27* 32*  CREATININE 0.92 0.93 0.64 0.63 0.76  CALCIUM 9.3 8.7* 9.3 8.9 9.1  MG  --  2.3 2.1 1.9 2.1  PHOS  --  4.2 3.9 3.9 3.9   Liver Function Tests: Recent Labs  Lab 05/13/23 0606 05/14/23 0617 05/15/23 0552 05/16/23 0752  AST 26 24 20  36  ALT 25 29 31  63*  ALKPHOS 32* 31* 29* 34*  BILITOT 0.5 0.8 0.8 1.1  PROT 6.0* 5.8* 5.6* 5.9*  ALBUMIN 3.1* 3.0* 2.9* 3.2*   CBG: No results for input(s): "GLUCAP" in the last  168 hours.  Discharge time spent: {LESS THAN/GREATER ZOXW:96045} 30 minutes.  Signed: Lurene Shadow, MD [facility] 05/16/2023

## 2023-06-03 DIAGNOSIS — J9601 Acute respiratory failure with hypoxia: Secondary | ICD-10-CM | POA: Diagnosis not present

## 2023-06-03 DIAGNOSIS — I480 Paroxysmal atrial fibrillation: Secondary | ICD-10-CM | POA: Diagnosis not present

## 2023-06-10 DIAGNOSIS — J9601 Acute respiratory failure with hypoxia: Secondary | ICD-10-CM | POA: Diagnosis not present

## 2023-06-10 DIAGNOSIS — I4891 Unspecified atrial fibrillation: Secondary | ICD-10-CM | POA: Diagnosis not present

## 2023-06-10 DIAGNOSIS — J9600 Acute respiratory failure, unspecified whether with hypoxia or hypercapnia: Secondary | ICD-10-CM | POA: Diagnosis not present

## 2023-06-26 DIAGNOSIS — J9600 Acute respiratory failure, unspecified whether with hypoxia or hypercapnia: Secondary | ICD-10-CM | POA: Diagnosis not present

## 2023-06-26 DIAGNOSIS — Z87891 Personal history of nicotine dependence: Secondary | ICD-10-CM | POA: Diagnosis not present

## 2023-06-26 DIAGNOSIS — J449 Chronic obstructive pulmonary disease, unspecified: Secondary | ICD-10-CM | POA: Diagnosis not present

## 2023-06-26 DIAGNOSIS — I4891 Unspecified atrial fibrillation: Secondary | ICD-10-CM | POA: Diagnosis not present

## 2023-07-30 DIAGNOSIS — H109 Unspecified conjunctivitis: Secondary | ICD-10-CM | POA: Diagnosis not present

## 2023-08-01 DIAGNOSIS — Z0000 Encounter for general adult medical examination without abnormal findings: Secondary | ICD-10-CM | POA: Diagnosis not present

## 2023-08-01 DIAGNOSIS — Z8521 Personal history of malignant neoplasm of larynx: Secondary | ICD-10-CM | POA: Diagnosis not present

## 2023-08-01 DIAGNOSIS — I4891 Unspecified atrial fibrillation: Secondary | ICD-10-CM | POA: Diagnosis not present

## 2023-08-01 DIAGNOSIS — J9611 Chronic respiratory failure with hypoxia: Secondary | ICD-10-CM | POA: Diagnosis present

## 2023-08-01 DIAGNOSIS — Z1331 Encounter for screening for depression: Secondary | ICD-10-CM | POA: Diagnosis not present

## 2023-08-01 DIAGNOSIS — J449 Chronic obstructive pulmonary disease, unspecified: Secondary | ICD-10-CM | POA: Diagnosis not present

## 2023-08-07 DIAGNOSIS — R739 Hyperglycemia, unspecified: Secondary | ICD-10-CM | POA: Diagnosis not present

## 2023-08-07 DIAGNOSIS — E782 Mixed hyperlipidemia: Secondary | ICD-10-CM | POA: Diagnosis not present

## 2023-08-07 DIAGNOSIS — Z125 Encounter for screening for malignant neoplasm of prostate: Secondary | ICD-10-CM | POA: Diagnosis not present

## 2023-08-07 DIAGNOSIS — E538 Deficiency of other specified B group vitamins: Secondary | ICD-10-CM | POA: Diagnosis not present

## 2023-08-12 DIAGNOSIS — H2511 Age-related nuclear cataract, right eye: Secondary | ICD-10-CM | POA: Diagnosis not present

## 2023-08-12 DIAGNOSIS — H2589 Other age-related cataract: Secondary | ICD-10-CM | POA: Diagnosis not present

## 2023-08-12 DIAGNOSIS — H401123 Primary open-angle glaucoma, left eye, severe stage: Secondary | ICD-10-CM | POA: Diagnosis not present

## 2023-08-12 DIAGNOSIS — H04123 Dry eye syndrome of bilateral lacrimal glands: Secondary | ICD-10-CM | POA: Diagnosis not present

## 2023-08-19 DIAGNOSIS — Z0000 Encounter for general adult medical examination without abnormal findings: Secondary | ICD-10-CM | POA: Diagnosis not present

## 2023-08-19 DIAGNOSIS — J449 Chronic obstructive pulmonary disease, unspecified: Secondary | ICD-10-CM | POA: Diagnosis not present

## 2023-08-19 DIAGNOSIS — I482 Chronic atrial fibrillation, unspecified: Secondary | ICD-10-CM | POA: Diagnosis not present

## 2023-08-26 DIAGNOSIS — H2511 Age-related nuclear cataract, right eye: Secondary | ICD-10-CM | POA: Diagnosis not present

## 2023-08-27 ENCOUNTER — Encounter: Payer: Self-pay | Admitting: Ophthalmology

## 2023-08-28 ENCOUNTER — Encounter: Payer: Self-pay | Admitting: Ophthalmology

## 2023-08-28 DIAGNOSIS — C329 Malignant neoplasm of larynx, unspecified: Secondary | ICD-10-CM | POA: Diagnosis not present

## 2023-08-28 DIAGNOSIS — Z9981 Dependence on supplemental oxygen: Secondary | ICD-10-CM | POA: Diagnosis not present

## 2023-08-28 DIAGNOSIS — F17211 Nicotine dependence, cigarettes, in remission: Secondary | ICD-10-CM | POA: Diagnosis not present

## 2023-08-28 DIAGNOSIS — J449 Chronic obstructive pulmonary disease, unspecified: Secondary | ICD-10-CM | POA: Diagnosis not present

## 2023-08-28 DIAGNOSIS — I272 Pulmonary hypertension, unspecified: Secondary | ICD-10-CM | POA: Diagnosis not present

## 2023-08-28 NOTE — Anesthesia Preprocedure Evaluation (Addendum)
 Anesthesia Evaluation  Patient identified by MRN, date of birth, ID band Patient awake    Reviewed: Allergy & Precautions, H&P , NPO status , Patient's Chart, lab work & pertinent test results, reviewed documented beta blocker date and time   History of Anesthesia Complications Negative for: history of anesthetic complications  Airway Mallampati: II  TM Distance: >3 FB Neck ROM: full    Dental  (+) Edentulous Upper, Edentulous Lower, Upper Dentures, Lower Dentures, Dental Advidsory Given   Pulmonary shortness of breath, with exertion and Long-Term Oxygen Therapy, asthma , COPD,  COPD inhaler and oxygen dependent, neg recent URI, former smoker Office note 06-10-23 Goal pulse ox greater than 88%. Currently at 92% at home   Pulmonary exam normal breath sounds clear to auscultation       Cardiovascular Exercise Tolerance: Good hypertension, (-) angina + CAD and +CHF  (-) Past MI and (-) Cardiac Stents + dysrhythmias Atrial Fibrillation (-) Valvular Problems/Murmurs Rhythm:regular Rate:Normal  Cath 5/21 proximal RCA 100% with collaterals, OM1 100% off LCx, second diagonal LAD 75%, medical management, Dr. Cara Chancellor  05-02-23  1. Left ventricular ejection fraction, by estimation, is 55 to 60%. The  left ventricle has normal function. Left ventricular endocardial border  not optimally defined to evaluate regional wall motion. Left ventricular  diastolic parameters are  indeterminate.   2. Right ventricular systolic function is mildly reduced. The right  ventricular size is moderately enlarged.   3. The mitral valve was not well visualized. No evidence of mitral valve  regurgitation. No evidence of mitral stenosis.   4. The aortic valve was not well visualized. Aortic valve regurgitation  is not visualized. No aortic stenosis is present.     Neuro/Psych negative neurological ROS  negative psych ROS   GI/Hepatic Neg liver ROS,GERD  ,,   Endo/Other  neg diabetesHypothyroidism    Renal/GU negative Renal ROS  negative genitourinary   Musculoskeletal   Abdominal   Peds  Hematology negative hematology ROS (+)   Anesthesia Other Findings Oxygen dependent at night and with exertion, patient states can lie flat.   Cancer of larynx (HCC)  PE (pulmonary thromboembolism) (HCC) COPD (chronic obstructive pulmonary disease) (HCC)  Hypothyroidism Hypertension  Atrial fibrillation (HCC) CHF (congestive heart failure) (HCC) Hyperlipidemia Wears dentures  Chronic respiratory failure with hypoxia (HCC) Supplemental oxygen dependent Atherosclerosis of native coronary artery of native heart with stable angina pectoris (HCC) Dilated cardiomyopathy (HCC)  Wears dentures    Reproductive/Obstetrics negative OB ROS                             Anesthesia Physical Anesthesia Plan  ASA: 4  Anesthesia Plan: MAC   Post-op Pain Management:    Induction: Intravenous  PONV Risk Score and Plan: 1 and Midazolam  and Treatment may vary due to age or medical condition  Airway Management Planned: Natural Airway and Nasal Cannula  Additional Equipment:   Intra-op Plan:   Post-operative Plan:   Informed Consent: I have reviewed the patients History and Physical, chart, labs and discussed the procedure including the risks, benefits and alternatives for the proposed anesthesia with the patient or authorized representative who has indicated his/her understanding and acceptance.     Dental Advisory Given  Plan Discussed with: Anesthesiologist, CRNA and Surgeon  Anesthesia Plan Comments:        Anesthesia Quick Evaluation

## 2023-09-01 NOTE — Discharge Instructions (Signed)

## 2023-09-02 ENCOUNTER — Ambulatory Visit
Admission: RE | Admit: 2023-09-02 | Discharge: 2023-09-02 | Disposition: A | Discharge status: Home / Self Care | Payer: Medicare HMO | Source: Ambulatory Visit | Attending: Oncology | Admitting: Oncology

## 2023-09-02 DIAGNOSIS — R911 Solitary pulmonary nodule: Secondary | ICD-10-CM | POA: Diagnosis not present

## 2023-09-02 DIAGNOSIS — R918 Other nonspecific abnormal finding of lung field: Secondary | ICD-10-CM | POA: Diagnosis not present

## 2023-09-02 DIAGNOSIS — J432 Centrilobular emphysema: Secondary | ICD-10-CM | POA: Diagnosis not present

## 2023-09-03 ENCOUNTER — Ambulatory Visit: Payer: Self-pay | Admitting: Anesthesiology

## 2023-09-03 ENCOUNTER — Encounter
Admission: RE | Disposition: A | Discharge status: Home / Self Care | Payer: Self-pay | Source: Home / Self Care | Attending: Ophthalmology

## 2023-09-03 ENCOUNTER — Ambulatory Visit
Admission: RE | Admit: 2023-09-03 | Discharge: 2023-09-03 | Disposition: A | Discharge status: Home / Self Care | Attending: Ophthalmology | Admitting: Ophthalmology

## 2023-09-03 ENCOUNTER — Encounter: Payer: Self-pay | Admitting: Ophthalmology

## 2023-09-03 ENCOUNTER — Other Ambulatory Visit: Payer: Self-pay

## 2023-09-03 DIAGNOSIS — I4891 Unspecified atrial fibrillation: Secondary | ICD-10-CM | POA: Insufficient documentation

## 2023-09-03 DIAGNOSIS — I110 Hypertensive heart disease with heart failure: Secondary | ICD-10-CM | POA: Insufficient documentation

## 2023-09-03 DIAGNOSIS — E119 Type 2 diabetes mellitus without complications: Secondary | ICD-10-CM | POA: Insufficient documentation

## 2023-09-03 DIAGNOSIS — I509 Heart failure, unspecified: Secondary | ICD-10-CM | POA: Diagnosis not present

## 2023-09-03 DIAGNOSIS — J449 Chronic obstructive pulmonary disease, unspecified: Secondary | ICD-10-CM | POA: Insufficient documentation

## 2023-09-03 DIAGNOSIS — I2510 Atherosclerotic heart disease of native coronary artery without angina pectoris: Secondary | ICD-10-CM | POA: Insufficient documentation

## 2023-09-03 DIAGNOSIS — H2511 Age-related nuclear cataract, right eye: Secondary | ICD-10-CM | POA: Diagnosis not present

## 2023-09-03 DIAGNOSIS — Z9981 Dependence on supplemental oxygen: Secondary | ICD-10-CM | POA: Diagnosis not present

## 2023-09-03 DIAGNOSIS — E039 Hypothyroidism, unspecified: Secondary | ICD-10-CM | POA: Diagnosis not present

## 2023-09-03 DIAGNOSIS — Z7951 Long term (current) use of inhaled steroids: Secondary | ICD-10-CM | POA: Diagnosis not present

## 2023-09-03 DIAGNOSIS — I480 Paroxysmal atrial fibrillation: Secondary | ICD-10-CM | POA: Diagnosis not present

## 2023-09-03 DIAGNOSIS — Z7901 Long term (current) use of anticoagulants: Secondary | ICD-10-CM | POA: Diagnosis not present

## 2023-09-03 HISTORY — DX: Chronic respiratory failure with hypoxia: J96.11

## 2023-09-03 HISTORY — DX: Presence of dental prosthetic device (complete) (partial): Z97.2

## 2023-09-03 HISTORY — DX: Heart failure, unspecified: I50.9

## 2023-09-03 HISTORY — DX: Hyperlipidemia, unspecified: E78.5

## 2023-09-03 HISTORY — DX: Atherosclerotic heart disease of native coronary artery with other forms of angina pectoris: I25.118

## 2023-09-03 HISTORY — DX: Dependence on supplemental oxygen: Z99.81

## 2023-09-03 HISTORY — DX: Essential (primary) hypertension: I10

## 2023-09-03 HISTORY — DX: Dilated cardiomyopathy: I42.0

## 2023-09-03 HISTORY — DX: Unspecified atrial fibrillation: I48.91

## 2023-09-03 HISTORY — PX: CATARACT EXTRACTION W/PHACO: SHX586

## 2023-09-03 SURGERY — PHACOEMULSIFICATION, CATARACT, WITH IOL INSERTION
Anesthesia: Monitor Anesthesia Care | Site: Eye | Laterality: Right

## 2023-09-03 MED ORDER — BRIMONIDINE TARTRATE-TIMOLOL 0.2-0.5 % OP SOLN
OPHTHALMIC | Status: DC | PRN
  Administered 2023-09-03: 1 [drp] via OPHTHALMIC

## 2023-09-03 MED ORDER — TETRACAINE HCL 0.5 % OP SOLN
OPHTHALMIC | Status: AC
  Filled 2023-09-03: qty 4

## 2023-09-03 MED ORDER — LIDOCAINE HCL (PF) 2 % IJ SOLN
INTRAOCULAR | Status: DC | PRN
  Administered 2023-09-03: 2 mL

## 2023-09-03 MED ORDER — CEFUROXIME OPHTHALMIC INJECTION 1 MG/0.1 ML
INJECTION | OPHTHALMIC | Status: DC | PRN
  Administered 2023-09-03: 1 mg via INTRACAMERAL

## 2023-09-03 MED ORDER — SIGHTPATH DOSE#1 BSS IO SOLN
INTRAOCULAR | Status: DC | PRN
  Administered 2023-09-03: 53 mL via OPHTHALMIC

## 2023-09-03 MED ORDER — FENTANYL CITRATE (PF) 100 MCG/2ML IJ SOLN
INTRAMUSCULAR | Status: AC
  Filled 2023-09-03: qty 2

## 2023-09-03 MED ORDER — MIDAZOLAM HCL 2 MG/2ML IJ SOLN
INTRAMUSCULAR | Status: AC
Start: 2023-09-03 — End: ?
  Filled 2023-09-03: qty 2

## 2023-09-03 MED ORDER — SIGHTPATH DOSE#1 BSS IO SOLN
INTRAOCULAR | Status: DC | PRN
  Administered 2023-09-03: 15 mL via INTRAOCULAR

## 2023-09-03 MED ORDER — SIGHTPATH DOSE#1 NA HYALUR & NA CHOND-NA HYALUR IO KIT
PACK | INTRAOCULAR | Status: DC | PRN
  Administered 2023-09-03: 1 via OPHTHALMIC

## 2023-09-03 MED ORDER — TETRACAINE HCL 0.5 % OP SOLN
1.0000 [drp] | OPHTHALMIC | Status: DC | PRN
  Administered 2023-09-03 (×3): 1 [drp] via OPHTHALMIC

## 2023-09-03 MED ORDER — ARMC OPHTHALMIC DILATING DROPS
OPHTHALMIC | Status: AC
  Filled 2023-09-03: qty 0.5

## 2023-09-03 MED ORDER — ARMC OPHTHALMIC DILATING DROPS
1.0000 | OPHTHALMIC | Status: DC | PRN
  Administered 2023-09-03 (×3): 1 via OPHTHALMIC

## 2023-09-03 MED ORDER — MIDAZOLAM HCL 2 MG/2ML IJ SOLN
INTRAMUSCULAR | Status: DC | PRN
  Administered 2023-09-03: 1 mg via INTRAVENOUS

## 2023-09-03 MED ORDER — FENTANYL CITRATE (PF) 100 MCG/2ML IJ SOLN
INTRAMUSCULAR | Status: DC | PRN
  Administered 2023-09-03: 50 ug via INTRAVENOUS

## 2023-09-03 SURGICAL SUPPLY — 11 items
CATARACT SUITE SIGHTPATH (MISCELLANEOUS) ×1 IMPLANT
FEE CATARACT SUITE SIGHTPATH (MISCELLANEOUS) ×1 IMPLANT
GLOVE BIOGEL PI IND STRL 8 (GLOVE) ×1 IMPLANT
GLOVE SURG LX STRL 7.5 STRW (GLOVE) ×1 IMPLANT
GLOVE SURG PROTEXIS BL SZ6.5 (GLOVE) ×1 IMPLANT
GLOVE SURG SYN 6.5 PF PI BL (GLOVE) ×1 IMPLANT
LENS IOL TECNIS EYHANCE 20.5 (Intraocular Lens) IMPLANT
NDL FILTER BLUNT 18X1 1/2 (NEEDLE) ×1 IMPLANT
NEEDLE FILTER BLUNT 18X1 1/2 (NEEDLE) ×1 IMPLANT
RING MALYGIN 7.0 (MISCELLANEOUS) IMPLANT
SYR 3ML LL SCALE MARK (SYRINGE) ×1 IMPLANT

## 2023-09-03 NOTE — H&P (Signed)
 [facility]   Primary Care Physician:  Sari Cunning, MD Ophthalmologist: Dr. Annell Kidney  Pre-Procedure History & Physical: HPI:  Benjamin Torres is a 68 y.o. male here for ophthalmic surgery.   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  Has, does NOT wear   Wears dentures     Past Surgical History:  Procedure Laterality Date   ANAL FISSURE REPAIR     APPENDECTOMY     BIOPSY PHARYNX  2015   Larynx BX   COLONOSCOPY WITH PROPOFOL  N/A 03/07/2017   Procedure: COLONOSCOPY WITH PROPOFOL ;  Surgeon: Marnee Sink, MD;  Location: [facility];  Service: Endoscopy;  Laterality: N/A;   ESOPHAGOGASTRODUODENOSCOPY (EGD) WITH PROPOFOL  N/A 03/07/2017   Procedure: ESOPHAGOGASTRODUODENOSCOPY (EGD) WITH PROPOFOL ;  Surgeon: Marnee Sink, MD;  Location: [facility];  Service: Endoscopy;  Laterality: N/A;   LEFT HEART CATH AND CORONARY ANGIOGRAPHY Left 09/23/2019   Procedure: LEFT HEART CATH AND CORONARY ANGIOGRAPHY;  Surgeon: Ronney Cola, MD;  Location: [facility];  Service: Cardiovascular;  Laterality: Left;   POLYPECTOMY  03/07/2017   Procedure: POLYPECTOMY;  Surgeon: Marnee Sink, MD;  Location: [facility];  Service: Endoscopy;;    Prior to Admission medications   Medication Sig Start Date End Date Taking? Authorizing Provider  acetaminophen  (TYLENOL ) 325 MG tablet Take 2 tablets (650 mg total) by mouth every 6  (six) hours as needed for mild pain (pain score 1-3). 05/16/23  Yes Sheril Dines, MD  albuterol  (PROVENTIL  HFA;VENTOLIN  HFA) 108 (90 Base) MCG/ACT inhaler Inhale 1 puff into the lungs every 4 (four) hours as needed for wheezing or shortness of breath.   Yes [provider]  albuterol  (PROVENTIL ) (2.5 MG/3ML) 0.083% nebulizer solution Take 3 mLs (2.5 mg total) by nebulization every 6 (six) hours as needed for wheezing or shortness of breath. 05/16/23  Yes Sheril Dines, MD  ALPRAZolam  (XANAX ) 0.5 MG tablet Take 0.5 mg by mouth 2 (two) times daily.   Yes [provider]  apixaban  (ELIQUIS ) 5 MG TABS tablet Take 1 tablet (5 mg total) by mouth 2 (two) times daily. 05/16/23  Yes Sheril Dines, MD  Budeson-Glycopyrrol-Formoterol (BREZTRI  AEROSPHERE) 160-9-4.8 MCG/ACT AERO Inhale 2 puffs into the lungs in the morning and at bedtime. 02/03/23  Yes Dgayli, Berneta Brightly, MD  cyanocobalamin  (,VITAMIN B-12,) 1000 MCG/ML injection Inject 1,000 mcg into the muscle every 30 (thirty) days. 10/23/21  Yes [provider]  ezetimibe  (ZETIA ) 10 MG tablet Take 1 tablet (10 mg total) by mouth daily. 02/10/23 08/27/23 Yes Gollan, Timothy J, MD  gabapentin  (NEURONTIN ) 100 MG capsule Take 100 mg by mouth daily. 05/27/22  Yes [provider]  levothyroxine  (SYNTHROID ) 100 MCG tablet Take 100 mcg by mouth daily before breakfast.   Yes [provider]  metoprolol  succinate (TOPROL -XL) 50 MG 24 hr tablet Take 1 tablet (50 mg total) by mouth  daily. 05/16/23  Yes Sheril Dines, MD  montelukast  (SINGULAIR ) 10 MG tablet Take 10 mg by mouth daily. 05/07/22  Yes [provider]  OXYGEN Inhale into the lungs. At night and with exertion   Yes [provider]  rosuvastatin  (CRESTOR ) 10 MG tablet Take 10 mg by mouth at bedtime. 07/12/20  Yes [provider]  tamsulosin  (FLOMAX ) 0.4 MG CAPS capsule Take 1 capsule (0.4 mg total) by mouth daily. 09/22/18  Yes Lawerence Pressman, MD   nicotine  (NICODERM CQ  - DOSED IN MG/24 HOURS) 21 mg/24hr patch Place 1 patch (21 mg total) onto the skin daily. Patient not taking: Reported on 08/27/2023 05/17/23   Sheril Dines, MD    Allergies as of 08/14/2023   (No Known Allergies)    Family History  Problem Relation Age of Onset   CAD Mother    Cancer - Lung Father     Social History   Socioeconomic History   Marital status: Married    Spouse name: Not on file   Number of children: Not on file   Years of education: Not on file   Highest education level: Not on file  Occupational History   Not on file  Tobacco Use   Smoking status: Former    Current packs/day: 0.00    Average packs/day: 1.5 packs/day for 49.9 years (74.9 ttl pk-yrs)    Types: E-cigarettes, Cigarettes    Start date: 40    Quit date: 03/30/2023    Years since quitting: 0.4   Smokeless tobacco: Never   Tobacco comments:    Smokes 12-15 cigarettes daily. 06/13/2022.   Vaping Use   Vaping status: Former  Substance and Sexual Activity   Alcohol use: No    Alcohol/week: 0.0 standard drinks of alcohol   Drug use: Never   Sexual activity: Not on file  Other Topics Concern   Not on file  Social History Narrative   Not on file   Social Drivers of Health   Financial Resource Strain: Low Risk  (08/19/2023)   Received from [facility]   Overall Financial Resource Strain (CARDIA)    Difficulty of Paying Living Expenses: Not hard at all  Food Insecurity: No Food Insecurity (08/19/2023)   Received from [facility]   Hunger Vital Sign    Worried About Running Out of Food in the Last Year: Never true    Ran Out of Food in the Last Year: Never true  Transportation Needs: No Transportation Needs (08/19/2023)   Received from [facility]    In the past 12 months, has lack of transportation kept you from medical appointments or from getting medications?: No    Lack of  Transportation (Non-Medical): No  Physical Activity: Not on file  Stress: Not on file  Social Connections: Moderately Integrated (05/13/2023)   Social Connection and Isolation Panel [NHANES]    Frequency of Communication with Friends and Family: More than three times a week    Frequency of Social Gatherings with Friends and Family: Once a week    Attends Religious Services: More than 4 times per year    Active Member of [employer] or Organizations: No    Attends Banker Meetings: Never    Marital Status: Married  Catering manager Violence: Not At Risk (05/13/2023)   Humiliation, Afraid, Rape, and Kick questionnaire    Fear of Current or Ex-Partner: No    Emotionally Abused: No  Physically Abused: No    Sexually Abused: No    Review of Systems: See HPI, otherwise negative ROS  Physical Exam: BP (!) 128/94   Pulse 61   Temp (!) 97.3 F (36.3 C) (Temporal)   Resp 18   Ht 5\' 6"  (1.676 m)   Wt 73.9 kg   SpO2 100%   BMI 26.31 kg/m  General:   Alert,  pleasant and cooperative in NAD Head:  Normocephalic and atraumatic. Lungs:  Clear to auscultation.    Heart:  Regular rate and rhythm.   Impression/Plan: Benjamin Torres is here for ophthalmic surgery.  Risks, benefits, limitations, and alternatives regarding ophthalmic surgery have been reviewed with the patient.  Questions have been answered.  All parties agreeable.   Annell Kidney, MD  09/03/2023, 10:19 AM

## 2023-09-03 NOTE — Anesthesia Postprocedure Evaluation (Signed)
 Anesthesia Post Note  Patient: Benjamin Torres  Procedure(s) Performed: PHACOEMULSIFICATION, CATARACT, WITH IOL INSERTION 4.97 00:30.5 (Right: Eye)  Patient location during evaluation: PACU Anesthesia Type: MAC Level of consciousness: awake and alert Pain management: pain level controlled Vital Signs Assessment: post-procedure vital signs reviewed and stable Respiratory status: spontaneous breathing, nonlabored ventilation, respiratory function stable and patient connected to nasal cannula oxygen Cardiovascular status: stable and blood pressure returned to baseline Postop Assessment: no apparent nausea or vomiting Anesthetic complications: no   No notable events documented.   Last Vitals:  Vitals:   09/03/23 1134 09/03/23 1138  BP: 126/83 125/83  Pulse: (!) 56 (!) 55  Resp: 14 12  Temp: (!) 36.3 C (!) 36.3 C  SpO2: 99% 100%    Last Pain:  Vitals:   09/03/23 1138  TempSrc:   PainSc: 0-No pain                 Vanice Genre

## 2023-09-03 NOTE — Transfer of Care (Signed)
 Immediate Anesthesia Transfer of Care Note  Patient: Benjamin Torres  Procedure(s) Performed: PHACOEMULSIFICATION, CATARACT, WITH IOL INSERTION 4.97 00:30.5 (Right: Eye)  Patient Location: PACU  Anesthesia Type: MAC  Level of Consciousness: awake, alert  and patient cooperative  Airway and Oxygen Therapy: Patient Spontanous Breathing and Patient connected to supplemental oxygen  Post-op Assessment: Post-op Vital signs reviewed, Patient's Cardiovascular Status Stable, Respiratory Function Stable, Patent Airway and No signs of Nausea or vomiting  Post-op Vital Signs: Reviewed and stable  Complications: No notable events documented.

## 2023-09-03 NOTE — Op Note (Signed)
 OPERATIVE NOTE  Benjamin Torres [id number] 09/03/2023   PREOPERATIVE DIAGNOSIS:    Nuclear Sclerotic Cataract Right eye with miotic pupil.        H25.11  POSTOPERATIVE DIAGNOSIS: Nuclear Sclerotic Cataract Right eye with miotic pupil.          PROCEDURE:  Phacoemusification with posterior chamber intraocular lens placement of the right eye which required pupil stretching with the Malyugin pupil expansion device. Ultrasound time: Procedure(s): PHACOEMULSIFICATION, CATARACT, WITH IOL INSERTION 4.97 00:30.5 (Right)  LENS:   Implant Name Type Inv. Item Serial No. Manufacturer Lot No. LRB No. Used Action  LENS IOL TECNIS EYHANCE 20.5 - [id number] Intraocular Lens LENS IOL TECNIS EYHANCE 20.5 [id number] [employer]  Right 1 Implanted      SURGEON:  Berline Brenner, MD   ANESTHESIA:  Topical with tetracaine drops and 2% Xylocaine  jelly, augmented with 1% preservative-free intracameral lidocaine .   COMPLICATIONS:  None.   DESCRIPTION OF PROCEDURE:  The patient was identified in the holding room and transported to the operating room and placed in the supine position under the operating microscope. The right eye was identified as the operative eye and it was prepped and draped in the usual sterile ophthalmic fashion.   A 1 millimeter clear-corneal paracentesis was made at the 12:00 position.  0.5 ml of preservative-free 1% lidocaine  was injected into the anterior chamber. The anterior chamber was filled with Viscoat viscoelastic.  A 2.4 millimeter keratome was used to make a near-clear corneal incision at the 9:00 position. A Malyugin pupil expander was then placed through the main incision and into the anterior chamber of the eye.  The edge of the iris was secured on the lip of the pupil expander and it was released, thereby expanding the pupil to approximately 7 millimeters for completion of the cataract surgery.  Additional Viscoat was placed in the anterior chamber.  A cystotome and  capsulorrhexis forceps were used to make a curvilinear capsulorrhexis.   Balanced salt solution was used to hydrodissect and hydrodelineate the lens nucleus.   Phacoemulsification was used in stop and chop fashion to remove the lens, nucleus and epinucleus.  The remaining cortex was aspirated using the irrigation aspiration handpiece.  Additional Provisc was placed into the eye to distend the capsular bag for lens placement.  A lens was then injected into the capsular bag.  The pupil expanding ring was removed using a Kuglen hook and insertion device. The remaining viscoelastic was aspirated from the capsular bag and the anterior chamber.  The anterior chamber was filled with balanced salt solution to inflate to a physiologic pressure.  Wounds were hydrated with balanced salt solution.  The anterior chamber was inflated to a physiologic pressure with balanced salt solution.  No wound leaks were noted.Cefuroxime 0.1 ml of a 10mg /ml solution was injected into the anterior chamber for a dose of 1 mg of intracameral antibiotic at the completion of the case. Timolol and Brimonidine drops were applied to the eye.  The patient was taken to the recovery room in stable condition without complications of anesthesia or surgery.  [name] 09/03/2023, 11:34 AM

## 2023-09-05 ENCOUNTER — Other Ambulatory Visit: Payer: Self-pay

## 2023-09-05 DIAGNOSIS — C329 Malignant neoplasm of larynx, unspecified: Secondary | ICD-10-CM

## 2023-09-08 ENCOUNTER — Inpatient Hospital Stay: Payer: Medicare HMO | Admitting: Oncology

## 2023-09-08 ENCOUNTER — Inpatient Hospital Stay: Payer: Medicare HMO

## 2023-09-30 DIAGNOSIS — Z0100 Encounter for examination of eyes and vision without abnormal findings: Secondary | ICD-10-CM | POA: Diagnosis not present

## 2023-10-20 ENCOUNTER — Inpatient Hospital Stay: Attending: Oncology

## 2023-10-20 ENCOUNTER — Encounter: Payer: Self-pay | Admitting: Oncology

## 2023-10-20 ENCOUNTER — Inpatient Hospital Stay (HOSPITAL_BASED_OUTPATIENT_CLINIC_OR_DEPARTMENT_OTHER): Admitting: Oncology

## 2023-10-20 VITALS — BP 105/82 | HR 72 | Temp 97.9°F | Resp 18 | Wt 169.3 lb

## 2023-10-20 DIAGNOSIS — C329 Malignant neoplasm of larynx, unspecified: Secondary | ICD-10-CM

## 2023-10-20 DIAGNOSIS — E039 Hypothyroidism, unspecified: Secondary | ICD-10-CM

## 2023-10-20 DIAGNOSIS — R911 Solitary pulmonary nodule: Secondary | ICD-10-CM | POA: Diagnosis not present

## 2023-10-20 DIAGNOSIS — Z720 Tobacco use: Secondary | ICD-10-CM | POA: Insufficient documentation

## 2023-10-20 DIAGNOSIS — Z8521 Personal history of malignant neoplasm of larynx: Secondary | ICD-10-CM | POA: Diagnosis not present

## 2023-10-20 LAB — CBC WITH DIFFERENTIAL (CANCER CENTER ONLY)
Abs Immature Granulocytes: 0.04 10*3/uL (ref 0.00–0.07)
Basophils Absolute: 0 10*3/uL (ref 0.0–0.1)
Basophils Relative: 0 %
Eosinophils Absolute: 0.2 10*3/uL (ref 0.0–0.5)
Eosinophils Relative: 2 %
HCT: 44.4 % (ref 39.0–52.0)
Hemoglobin: 14.5 g/dL (ref 13.0–17.0)
Immature Granulocytes: 0 %
Lymphocytes Relative: 16 %
Lymphs Abs: 1.5 10*3/uL (ref 0.7–4.0)
MCH: 28.7 pg (ref 26.0–34.0)
MCHC: 32.7 g/dL (ref 30.0–36.0)
MCV: 87.9 fL (ref 80.0–100.0)
Monocytes Absolute: 1 10*3/uL (ref 0.1–1.0)
Monocytes Relative: 10 %
Neutro Abs: 6.5 10*3/uL (ref 1.7–7.7)
Neutrophils Relative %: 72 %
Platelet Count: 179 10*3/uL (ref 150–400)
RBC: 5.05 MIL/uL (ref 4.22–5.81)
RDW: 13.1 % (ref 11.5–15.5)
WBC Count: 9.3 10*3/uL (ref 4.0–10.5)
nRBC: 0 % (ref 0.0–0.2)

## 2023-10-20 LAB — CMP (CANCER CENTER ONLY)
ALT: 16 U/L (ref 0–44)
AST: 17 U/L (ref 15–41)
Albumin: 3.7 g/dL (ref 3.5–5.0)
Alkaline Phosphatase: 44 U/L (ref 38–126)
Anion gap: 7 (ref 5–15)
BUN: 14 mg/dL (ref 8–23)
CO2: 25 mmol/L (ref 22–32)
Calcium: 8.8 mg/dL — ABNORMAL LOW (ref 8.9–10.3)
Chloride: 103 mmol/L (ref 98–111)
Creatinine: 0.66 mg/dL (ref 0.61–1.24)
GFR, Estimated: >60 mL/min (ref >60)
Glucose, Bld: 111 mg/dL — ABNORMAL HIGH (ref 70–99)
Potassium: 4.1 mmol/L (ref 3.5–5.1)
Sodium: 135 mmol/L (ref 135–145)
Total Bilirubin: 0.9 mg/dL (ref 0.0–1.2)
Total Protein: 6.7 g/dL (ref 6.5–8.1)

## 2023-10-20 LAB — TSH: TSH: 0.531 u[IU]/mL (ref 0.350–4.500)

## 2023-10-20 MED ORDER — NYSTATIN 100000 UNIT/ML MT SUSP: 5.0000 mL | Freq: Four times a day (QID) | OROMUCOSAL | 0 refills | Status: DC

## 2023-10-20 NOTE — Progress Notes (Signed)
 Hematology/Oncology Progress note [phone] [phone]      ASSESSMENT & PLAN:   Cancer of larynx (HCC) # Stage III supraglottic larynx cancer Clinically he is doing well.  He is 9 year post completion of treatment. Monitor thyroid  function annually.    Tobacco use Discussed about smoke cessation.  Patient is motivated  Lung nodule # Lung nodules.  Imaging was reviewed and discussed with patient.  05/23/22 CT scan showed New 8 x 5 mm RIGHT upper lobe pulmonary nodule, new right middle lobe nodules, wax and wane other nodules, chronic infection/inflammation vs malignancy 08/29/23 CT showed stable nodules, new lung nodules.  Discuss with patient and wife regarding options of repeat CT in 3 months vs PET. Will obtain in 3 months   Acquired hypothyroidism TSH and T4 were stable, continue synthroid  100mcg daily- managed by Dr. Cleotilde   Orders Placed This Encounter  Procedures   CT Chest Wo Contrast    Standing Status:   Future    Expected Date:   01/20/2024    Expiration Date:   10/19/2024    Preferred imaging location?:   [facility]   Follow-up in 3 months  All questions were answered. The patient knows to call the clinic with any problems, questions or concerns.  Zelphia Cap, MD, PhD [facility] Hematology Oncology 10/20/2023      Chief Complaint: Benjamin Torres is a 68 y.o. 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 PET scan on 08/18/2013 revealed hypermetabolic soft tissue lesion in the right hypopharynx consistent with epiglottic lesion. There also was a high right  paratracheal lymph node which was also hypermetabolic and suspicious for metastatic disease.  Clinical stage was T2N1M0.   He was treated with radiation therapy and cetuximab beginning 09/07/2013.  He received 7,000 cGy from 09/07/2013 - 11/10/2013.     06/21/2015 PET scan on  revealed no evidence of recurrent disease.  02/12/2017 PET revealed no evidence of hypermetabolic locoregional or distant metastatic disease.  11/2016 ENT evaluation iwas negative per patient.   He has hypothyroidism.  He is on Synthroid .   He has a posterior bladder diverticulum and prostatomegaly, which causes him to experience LUTS. PSA was 1.91 on 01/13/2017.   EGD on 03/07/2017 revealed a medium sized hiatal hernia, moderate inflammation in the stomach, and mild inflammation in the duodenal bulb. Findings c/w gastritis and duodenitis. Pathology demonstrated chronic inactive gastritis. Negative for H.pylori.    Colonoscopy on 03/07/2017 revealed two 4-5 mm sessile polyps in the descending colon, one 2 mm polyp in the sigmoid colon, diverticulosis, and non-bleeding internal hemorrhoids. Pathology showed that the 2 polyps from the descending colon were tubular adenomas, and the lesion from the sigmoid was a hyperplastic polyp.   April 2022 hospitalized due to pneumonia.   07/31/21 CT lung cancer screening- 1. Lung-RADS 4A, suspicious.  Left lower lobe nudule 7mm, left upper lobe nodule 8.67mm  10/31/21 CT lung cancer screening- 1. Lung-RADS 4A, suspicious.  left upper lobe nodule 8.64mm, new left upper lobe nodule 7.22mm, new 7 mm right upper lobe perifissural nodule   11/14/2021, PET scan showed no hypermetabolic pulmonary nodules, majority of the nodules are just at  or below the resolution of PET scan.   02/18/2022, CT scan showed interval development of several new lung nodules and slight increase of previously seen nodules.  Unclear etiology.  Nodules are very small and likely will PET scan detectable  sensitivity.  INTERVAL HISTORY Benjamin Torres is a 68 y.o. 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 He is not on chronic oxygen supplementation.  Nasal cannula 2 L.  No new complaints.  09/02/2023 CT chest without contrast showed new 10 mm nodule in the left lower lobe and 8 mm nodule within the right lower lobe.  Small hiatal hernia, cholelithiasis.  Aortic coronary artery atherosclerosis.  Emphysema.  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  Has, does NOT wear   Wears dentures     Past Surgical History:  Procedure Laterality Date   ANAL FISSURE REPAIR     APPENDECTOMY     BIOPSY PHARYNX  2015   Larynx BX   CATARACT EXTRACTION W/PHACO Right 09/03/2023   Procedure: PHACOEMULSIFICATION, CATARACT, WITH IOL INSERTION 4.97 00:30.5;  Surgeon: Mittie Gaskin, MD;  Location: [facility];  Service: Ophthalmology;  Laterality: Right;   COLONOSCOPY WITH PROPOFOL  N/A 03/07/2017   Procedure: COLONOSCOPY WITH PROPOFOL ;  Surgeon: Jinny Carmine, MD;  Location: [facility];  Service: Endoscopy;  Laterality: N/A;   ESOPHAGOGASTRODUODENOSCOPY (EGD) WITH PROPOFOL  N/A 03/07/2017   Procedure: ESOPHAGOGASTRODUODENOSCOPY (EGD) WITH PROPOFOL ;  Surgeon: Jinny Carmine, MD;  Location: [facility];  Service: Endoscopy;   Laterality: N/A;   LEFT HEART CATH AND CORONARY ANGIOGRAPHY Left 09/23/2019   Procedure: LEFT HEART CATH AND CORONARY ANGIOGRAPHY;  Surgeon: Bosie Vinie LABOR, MD;  Location: [facility];  Service: Cardiovascular;  Laterality: Left;   POLYPECTOMY  03/07/2017   Procedure: POLYPECTOMY;  Surgeon: Jinny Carmine, MD;  Location: [facility];  Service: Endoscopy;;    Family History  Problem Relation Age of Onset   CAD Mother    Cancer - Lung Father     Social History:  reports that he quit smoking about 6 months ago. His smoking use included e-cigarettes and cigarettes. He started smoking about 50 years ago. He has a 74.9 pack-year smoking history. He has never used smokeless tobacco. He reports that he does not drink alcohol and does not use drugs.  Allergies: No Known Allergies  Current Medications: Current Outpatient Medications  Medication Sig Dispense Refill   acetaminophen  (TYLENOL ) 325 MG tablet Take 2 tablets (650 mg total) by mouth every 6 (six) hours as needed for mild pain (pain score 1-3).     albuterol  (PROVENTIL  HFA;VENTOLIN  HFA) 108 (90 Base) MCG/ACT inhaler Inhale 1 puff into the lungs every 4 (four) hours as needed for wheezing or shortness of breath.  albuterol  (PROVENTIL ) (2.5 MG/3ML) 0.083% nebulizer solution Take 3 mLs (2.5 mg total) by nebulization every 6 (six) hours as needed for wheezing or shortness of breath. 360 mL 0   ALPRAZolam  (XANAX ) 0.5 MG tablet Take 0.5 mg by mouth 2 (two) times daily.     apixaban  (ELIQUIS ) 5 MG TABS tablet Take 1 tablet (5 mg total) by mouth 2 (two) times daily. 60 tablet 0   Budeson-Glycopyrrol-Formoterol (BREZTRI  AEROSPHERE) 160-9-4.8 MCG/ACT AERO Inhale 2 puffs into the lungs in the morning and at bedtime. 32.1 g 3   cyanocobalamin  (,VITAMIN B-12,) 1000 MCG/ML injection Inject 1,000 mcg into the muscle every 30 (thirty) days.     ezetimibe  (ZETIA ) 10 MG tablet Take 1 tablet (10 mg total) by mouth daily. 90 tablet 3    gabapentin  (NEURONTIN ) 100 MG capsule Take 100 mg by mouth daily.     levothyroxine  (SYNTHROID ) 100 MCG tablet Take 100 mcg by mouth daily before breakfast.     metoprolol  succinate (TOPROL -XL) 50 MG 24 hr tablet Take 1 tablet (50 mg total) by mouth daily. 30 tablet 0   montelukast  (SINGULAIR ) 10 MG tablet Take 10 mg by mouth daily.     nicotine  (NICODERM CQ  - DOSED IN MG/24 HOURS) 21 mg/24hr patch Place 1 patch (21 mg total) onto the skin daily.     nystatin (MYCOSTATIN) 100000 UNIT/ML suspension Take 5 mLs (500,000 Units total) by mouth 4 (four) times daily. 473 mL 0   OXYGEN Inhale into the lungs. At night and with exertion     rosuvastatin  (CRESTOR ) 10 MG tablet Take 10 mg by mouth at bedtime.     tamsulosin  (FLOMAX ) 0.4 MG CAPS capsule Take 1 capsule (0.4 mg total) by mouth daily. 90 capsule 3   No current facility-administered medications for this visit.   Facility-Administered Medications Ordered in Other Visits  Medication Dose Route Frequency Provider Last Rate Last Admin   sodium chloride  flush (NS) 0.9 % injection 3 mL  3 mL Intravenous Q12H Bosie Vinie LABOR, MD        Review of Systems  Constitutional: Negative.  Negative for chills, diaphoresis, fever, malaise/fatigue and weight loss (up 10 pound).  HENT: Negative.  Negative for congestion, ear discharge, ear pain, hearing loss, nosebleeds, sinus pain, sore throat and tinnitus.   Eyes: Negative.  Negative for blurred vision, double vision, photophobia and pain.  Respiratory:  Positive for shortness of breath. Negative for cough, hemoptysis, sputum production and wheezing.   Cardiovascular: Negative.  Negative for chest pain, palpitations, orthopnea, leg swelling and PND.  Gastrointestinal: Negative.  Negative for abdominal pain, blood in stool, constipation, diarrhea, heartburn, melena, nausea and vomiting.  Genitourinary: Negative.  Negative for dysuria, frequency, hematuria and urgency.       LUTS; difficulty initiating stream,  voids small amounts, nocturia. Known prostatomegaly and posterior bladder diverticulum; sees urology  Musculoskeletal: Negative.  Negative for back pain, falls, joint pain, myalgias and neck pain.  Skin: Negative.  Negative for itching and rash.  Neurological: Negative.  Negative for dizziness, tremors, speech change, focal weakness, weakness and headaches.  Endo/Heme/Allergies: Negative.  Does not bruise/bleed easily.       Hypothyroidism on levothyroxine .  Psychiatric/Behavioral:  Negative for depression, memory loss and suicidal ideas. The patient is not nervous/anxious and does not have insomnia.   All other systems reviewed and are negative.   Performance status (ECOG): 0 Vitals:   10/20/23 1344  BP: 105/82  Pulse: 72  Resp: 18  Temp: 97.9 F (  36.6 C)  SpO2: 99%     Physical Exam Constitutional:      General: He is not in acute distress.    Appearance: He is not diaphoretic.  HENT:     Head: Normocephalic and atraumatic.   Eyes:     General: No scleral icterus.   Cardiovascular:     Rate and Rhythm: Normal rate and regular rhythm.     Heart sounds: No murmur heard. Pulmonary:     Effort: Pulmonary effort is normal. No respiratory distress.     Breath sounds: No wheezing.     Comments: Decreased breath sounds bilaterally. Abdominal:     General: There is no distension.     Palpations: Abdomen is soft.     Tenderness: There is no abdominal tenderness.   Musculoskeletal:        General: Normal range of motion.     Cervical back: Normal range of motion and neck supple.   Skin:    General: Skin is warm and dry.     Findings: No erythema.   Neurological:     Mental Status: He is alert and oriented to person, place, and time. Mental status is at baseline.     Motor: No abnormal muscle tone.   Psychiatric:        Mood and Affect: Affect normal.    Labs    Latest Ref Rng & Units 10/20/2023    1:37 PM 05/16/2023    7:52 AM 05/15/2023    5:52 AM  CBC  WBC 4.0  - 10.5 K/uL 9.3  11.8  11.6   Hemoglobin 13.0 - 17.0 g/dL 85.4  83.8  84.7   Hematocrit 39.0 - 52.0 % 44.4  49.9  45.9   Platelets 150 - 400 K/uL 179  172  163       Latest Ref Rng & Units 10/20/2023    1:36 PM 05/16/2023    7:52 AM 05/15/2023    5:52 AM  CMP  Glucose 70 - 99 mg/dL 888  879  858   BUN 8 - 23 mg/dL 14  32  27   Creatinine 0.61 - 1.24 mg/dL 9.33  9.23  9.36   Sodium 135 - 145 mmol/L 135  140  139   Potassium 3.5 - 5.1 mmol/L 4.1  4.9  4.8   Chloride 98 - 111 mmol/L 103  97  101   CO2 22 - 32 mmol/L 25  35  29   Calcium  8.9 - 10.3 mg/dL 8.8  9.1  8.9   Total Protein 6.5 - 8.1 g/dL 6.7  5.9  5.6   Total Bilirubin 0.0 - 1.2 mg/dL 0.9  1.1  0.8   Alkaline Phos 38 - 126 U/L 44  34  29   AST 15 - 41 U/L 17  36  20   ALT 0 - 44 U/L 16  63  31    RADIOGRAPHIC STUDIES: I have personally reviewed the radiological images as listed and agreed with the findings in the report.  CT Chest Wo Contrast Result Date: 09/08/2023 CLINICAL DATA:  Pulmonary nodule follow-up. History of supraglottic larynx cancer EXAM: CT CHEST WITHOUT CONTRAST TECHNIQUE: Multidetector CT imaging of the chest was performed following the standard protocol without IV contrast. RADIATION DOSE REDUCTION: This exam was performed according to the departmental dose-optimization program which includes automated exposure control, adjustment of the mA and/or kV according to patient size and/or use of iterative reconstruction technique. COMPARISON:  05/10/2023 FINDINGS: Cardiovascular:  Heart size within normal limits. No pericardial effusion. Ascending thoracic aorta is upper limits of normal in caliber. Aortic and coronary artery atherosclerosis. Mediastinum/Nodes: No enlarged mediastinal or axillary lymph nodes. Thyroid  gland and trachea demonstrate no significant findings. Small hiatal hernia. Wall thickening of the distal esophagus. Lungs/Pleura: Moderate centrilobular and paraseptal emphysema. New 10 mm nodule within the  superior segment of the left lower lobe (series 4, image 72). New 8 mm nodule within the superior segment of the right lower lobe (series 4, image 72). Small area of scarring at the posterior left upper lobe is unchanged (series 4, image 24). Multiple small additional scattered pulmonary nodules are stable from prior including reference 4 mm anterior right upper lobe nodule (series 4, image 59). No pleural effusion or pneumothorax. Upper Abdomen: Cholelithiasis. Musculoskeletal: No chest wall mass or suspicious bone lesions identified. IMPRESSION: 1. New 10 mm nodule within the left lower lobe and 8 mm nodule within the right lower lobe. Metastatic disease is not excluded given history of cancer. Consider follow-up with PET-CT. 2. Small hiatal hernia with wall thickening of the distal esophagus, which may reflect esophagitis. 3. Cholelithiasis. 4. Aortic and coronary artery atherosclerosis (ICD10-I70.0). 5. Emphysema (ICD10-J43.9). Electronically Signed   By: Mabel Converse D.O.   On: 09/08/2023 10:46

## 2023-10-20 NOTE — Assessment & Plan Note (Signed)
Discussed about smoke cessation.  Patient is motivated

## 2023-10-20 NOTE — Assessment & Plan Note (Addendum)
 TSH and T4 were stable, continue synthroid  100mcg daily- managed by Dr. Cleotilde

## 2023-10-20 NOTE — Assessment & Plan Note (Signed)
#   Lung nodules.  Imaging was reviewed and discussed with patient.  05/23/22 CT scan showed New 8 x 5 mm RIGHT upper lobe pulmonary nodule, new right middle lobe nodules, wax and wane other nodules, chronic infection/inflammation vs malignancy 08/29/23 CT showed stable nodules, new lung nodules.  Discuss with patient and wife regarding options of repeat CT in 3 months vs PET. Will obtain in 3 months

## 2023-10-20 NOTE — Assessment & Plan Note (Signed)
#   Stage III supraglottic larynx cancer Clinically he is doing well.  He is 9 year post completion of treatment. Monitor thyroid function annually.

## 2023-11-06 DIAGNOSIS — C329 Malignant neoplasm of larynx, unspecified: Secondary | ICD-10-CM | POA: Diagnosis not present

## 2023-11-06 DIAGNOSIS — R918 Other nonspecific abnormal finding of lung field: Secondary | ICD-10-CM | POA: Diagnosis not present

## 2023-11-06 DIAGNOSIS — F17211 Nicotine dependence, cigarettes, in remission: Secondary | ICD-10-CM | POA: Diagnosis not present

## 2023-11-06 DIAGNOSIS — J449 Chronic obstructive pulmonary disease, unspecified: Secondary | ICD-10-CM | POA: Diagnosis not present

## 2024-01-20 ENCOUNTER — Telehealth: Payer: Self-pay | Admitting: Oncology

## 2024-01-20 ENCOUNTER — Ambulatory Visit

## 2024-01-20 NOTE — Telephone Encounter (Signed)
 Pt no showed CT scan today. Has MD appt for CT results next week.  I called pt and asked if he wanted to r/s his CT and he stated yes. Call transferred to Lauren H.

## 2024-01-22 ENCOUNTER — Ambulatory Visit
Admission: RE | Admit: 2024-01-22 | Discharge: 2024-01-22 | Disposition: A | Discharge status: Home / Self Care | Source: Ambulatory Visit | Attending: Oncology | Admitting: Oncology

## 2024-01-22 DIAGNOSIS — R918 Other nonspecific abnormal finding of lung field: Secondary | ICD-10-CM | POA: Diagnosis not present

## 2024-01-22 DIAGNOSIS — R911 Solitary pulmonary nodule: Secondary | ICD-10-CM | POA: Diagnosis not present

## 2024-01-22 DIAGNOSIS — J439 Emphysema, unspecified: Secondary | ICD-10-CM | POA: Diagnosis not present

## 2024-01-27 ENCOUNTER — Ambulatory Visit: Payer: Self-pay | Admitting: Oncology

## 2024-01-27 ENCOUNTER — Encounter: Payer: Self-pay | Admitting: Oncology

## 2024-01-27 ENCOUNTER — Inpatient Hospital Stay: Attending: Oncology | Admitting: Oncology

## 2024-01-27 VITALS — BP 109/77 | HR 69 | Temp 97.4°F | Resp 18 | Wt 175.0 lb

## 2024-01-27 DIAGNOSIS — C329 Malignant neoplasm of larynx, unspecified: Secondary | ICD-10-CM | POA: Diagnosis not present

## 2024-01-27 DIAGNOSIS — F17290 Nicotine dependence, other tobacco product, uncomplicated: Secondary | ICD-10-CM | POA: Diagnosis not present

## 2024-01-27 DIAGNOSIS — I4891 Unspecified atrial fibrillation: Secondary | ICD-10-CM | POA: Insufficient documentation

## 2024-01-27 DIAGNOSIS — I509 Heart failure, unspecified: Secondary | ICD-10-CM | POA: Diagnosis not present

## 2024-01-27 DIAGNOSIS — Z8521 Personal history of malignant neoplasm of larynx: Secondary | ICD-10-CM | POA: Insufficient documentation

## 2024-01-27 DIAGNOSIS — R911 Solitary pulmonary nodule: Secondary | ICD-10-CM

## 2024-01-27 DIAGNOSIS — E039 Hypothyroidism, unspecified: Secondary | ICD-10-CM | POA: Diagnosis not present

## 2024-01-27 DIAGNOSIS — R918 Other nonspecific abnormal finding of lung field: Secondary | ICD-10-CM | POA: Diagnosis not present

## 2024-01-27 DIAGNOSIS — Z79899 Other long term (current) drug therapy: Secondary | ICD-10-CM | POA: Diagnosis not present

## 2024-01-27 DIAGNOSIS — I110 Hypertensive heart disease with heart failure: Secondary | ICD-10-CM | POA: Insufficient documentation

## 2024-01-27 DIAGNOSIS — Z86711 Personal history of pulmonary embolism: Secondary | ICD-10-CM | POA: Insufficient documentation

## 2024-01-27 DIAGNOSIS — I2510 Atherosclerotic heart disease of native coronary artery without angina pectoris: Secondary | ICD-10-CM | POA: Diagnosis not present

## 2024-01-27 DIAGNOSIS — Z7901 Long term (current) use of anticoagulants: Secondary | ICD-10-CM | POA: Insufficient documentation

## 2024-01-27 DIAGNOSIS — Z720 Tobacco use: Secondary | ICD-10-CM | POA: Diagnosis not present

## 2024-01-27 NOTE — Progress Notes (Signed)
 Hematology/Oncology Progress note [phone] [phone]      ASSESSMENT & PLAN:   Cancer of larynx (HCC) # Stage III supraglottic larynx cancer Clinically he is doing well.  He is 9 year post completion of treatment. Monitor thyroid  function annually.  He recently has sore throat.  Recommend patient follow-up with ENT for further evaluation.  Acquired hypothyroidism TSH and T4 were stable, continue synthroid  100mcg daily- managed by Dr. Cleotilde  Lung nodule # Lung nodules.  Imaging was reviewed and discussed with patient.  05/23/22 CT scan showed New 8 x 5 mm RIGHT upper lobe pulmonary nodule, new right middle lobe nodules, wax and wane other nodules, chronic infection/inflammation vs malignancy 08/29/23 CT showed stable nodules, new lung nodules.   01/22/2024, CT chest showed Two of the three nodules that were new on prior exam are unchanged over the course of 4 months, largest 8 mm.  Other nodules are stable.  Patient has wax and wane lung nodules.  I recommend to repeat CT scan in 6 months  Tobacco use Discussed about smoke cessation.  Patient is motivated   No orders of the defined types were placed in this encounter.  Follow-up in 3 months  All questions were answered. The patient knows to call the clinic with any problems, questions or concerns.  Zelphia Cap, MD, PhD [facility] Hematology Oncology 01/27/2024      Chief Complaint: Benjamin Torres is a 68 y.o. 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 PET scan on 08/18/2013 revealed hypermetabolic soft tissue lesion in the  right hypopharynx consistent with epiglottic lesion. There also was a high right paratracheal lymph node which was also hypermetabolic and suspicious for metastatic disease.  Clinical stage was T2N1M0.   He was treated with radiation therapy and cetuximab beginning 09/07/2013.  He received 7,000 cGy from 09/07/2013 - 11/10/2013.     06/21/2015 PET scan on  revealed no evidence of recurrent disease.  02/12/2017 PET revealed no evidence of hypermetabolic locoregional or distant metastatic disease.  11/2016 ENT evaluation iwas negative per patient.   He has hypothyroidism.  He is on Synthroid .   He has a posterior bladder diverticulum and prostatomegaly, which causes him to experience LUTS. PSA was 1.91 on 01/13/2017.   EGD on 03/07/2017 revealed a medium sized hiatal hernia, moderate inflammation in the stomach, and mild inflammation in the duodenal bulb. Findings c/w gastritis and duodenitis. Pathology demonstrated chronic inactive gastritis. Negative for H.pylori.    Colonoscopy on 03/07/2017 revealed two 4-5 mm sessile polyps in the descending colon, one 2 mm polyp in the sigmoid colon, diverticulosis, and non-bleeding internal hemorrhoids. Pathology showed that the 2 polyps from the descending colon were tubular adenomas, and the lesion from the sigmoid was a hyperplastic polyp.   April 2022 hospitalized due to pneumonia.   07/31/21 CT lung cancer screening- 1. Lung-RADS 4A, suspicious.  Left lower lobe nudule 7mm, left upper lobe nodule 8.31mm  10/31/21 CT lung cancer screening- 1. Lung-RADS 4A, suspicious.  left upper lobe nodule 8.83mm, new left upper lobe nodule 7.51mm, new 7 mm right upper lobe perifissural nodule   11/14/2021, PET scan showed no hypermetabolic pulmonary nodules, majority  of the nodules are just at or below the resolution of PET scan.   02/18/2022, CT scan showed interval development of several new lung nodules and slight increase of previously seen nodules.  Unclear  etiology.  Nodules are very small and likely will PET scan detectable sensitivity.  09/02/2023 CT chest without contrast showed new 10 mm nodule in the left lower lobe and 8 mm nodule within the right lower lobe.  Small hiatal hernia, cholelithiasis.  Aortic coronary artery atherosclerosis.  Emphysema.  INTERVAL HISTORY Benjamin Torres is a 68 y.o. 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 He is not on chronic oxygen supplementation.  Nasal cannula 2 L.  No new complaints.  01/22/2024 CT chest wo contrast  1. Two of the three nodules that were new on prior exam are unchanged over the course of 4 months, largest 8 mm. In the setting of emphysema and prior malignancy, metastasis or primary bronchogenic malignancy are considered. . 2. Additional tiny nodules are stable from prior. No new pulmonary nodules. 3. Small hiatal hernia. 4. Coronary artery calcifications. 5. Aortic Atherosclerosis and Emphysema    Past Medical History:  Diagnosis Date   Atherosclerosis of native coronary artery of native heart with stable angina pectoris    Atrial fibrillation (HCC)    Cancer of larynx (HCC) 11/06/2014   CHF (congestive heart failure) (HCC)    Chronic respiratory failure with hypoxia (HCC)    COPD (chronic obstructive pulmonary disease) (HCC)    Dilated cardiomyopathy (HCC)    Hyperlipidemia    Hypertension    Hypothyroidism    PE (pulmonary thromboembolism) (HCC)    right lung, over 10 yrs ago   Supplemental oxygen dependent    Wears dentures    Full upper and lower.  Has, does NOT wear   Wears dentures     Past Surgical History:  Procedure Laterality Date   ANAL FISSURE REPAIR     APPENDECTOMY     BIOPSY PHARYNX  2015   Larynx BX   CATARACT EXTRACTION W/PHACO Right 09/03/2023   Procedure: PHACOEMULSIFICATION, CATARACT, WITH IOL INSERTION 4.97  00:30.5;  Surgeon: Mittie Gaskin, MD;  Location: [facility];  Service: Ophthalmology;  Laterality: Right;   COLONOSCOPY WITH PROPOFOL  N/A 03/07/2017   Procedure: COLONOSCOPY WITH PROPOFOL ;  Surgeon: Jinny Carmine, MD;  Location: [facility];  Service: Endoscopy;  Laterality: N/A;   ESOPHAGOGASTRODUODENOSCOPY (EGD) WITH PROPOFOL  N/A 03/07/2017   Procedure: ESOPHAGOGASTRODUODENOSCOPY (EGD) WITH PROPOFOL ;  Surgeon: Jinny Carmine, MD;  Location: [facility];  Service: Endoscopy;  Laterality: N/A;   LEFT HEART CATH AND CORONARY ANGIOGRAPHY Left 09/23/2019   Procedure: LEFT HEART CATH AND CORONARY ANGIOGRAPHY;  Surgeon: Bosie Vinie LABOR, MD;  Location: [facility];  Service: Cardiovascular;  Laterality: Left;   POLYPECTOMY  03/07/2017   Procedure: POLYPECTOMY;  Surgeon: Jinny Carmine, MD;  Location: [facility];  Service: Endoscopy;;    Family History  Problem Relation Age of Onset   CAD Mother    Cancer - Lung Father     Social History:  reports that he quit smoking about 9 months ago. His smoking use included e-cigarettes and cigarettes. He started smoking about 50 years ago. He has a 74.9 pack-year smoking history. He has never used smokeless tobacco. He reports that he does not drink alcohol and does not use  drugs.  Allergies: No Known Allergies  Current Medications: Current Outpatient Medications  Medication Sig Dispense Refill   acetaminophen  (TYLENOL ) 325 MG tablet Take 2 tablets (650 mg total) by mouth every 6 (six) hours as needed for mild pain (pain score 1-3).     albuterol  (PROVENTIL  HFA;VENTOLIN  HFA) 108 (90 Base) MCG/ACT inhaler Inhale 1 puff into the lungs every 4 (four) hours as needed for wheezing or shortness of breath.     albuterol  (PROVENTIL ) (2.5 MG/3ML) 0.083% nebulizer solution Take 3 mLs (2.5 mg total) by nebulization every 6 (six) hours as needed for wheezing or shortness of breath. 360 mL 0   ALPRAZolam  (XANAX ) 0.5 MG tablet Take  0.5 mg by mouth 2 (two) times daily.     apixaban  (ELIQUIS ) 5 MG TABS tablet Take 1 tablet (5 mg total) by mouth 2 (two) times daily. 60 tablet 0   Budeson-Glycopyrrol-Formoterol (BREZTRI  AEROSPHERE) 160-9-4.8 MCG/ACT AERO Inhale 2 puffs into the lungs in the morning and at bedtime. 32.1 g 3   cyanocobalamin  (,VITAMIN B-12,) 1000 MCG/ML injection Inject 1,000 mcg into the muscle every 30 (thirty) days.     ezetimibe  (ZETIA ) 10 MG tablet Take 1 tablet (10 mg total) by mouth daily. 90 tablet 3   gabapentin  (NEURONTIN ) 100 MG capsule Take 100 mg by mouth daily.     levothyroxine  (SYNTHROID ) 100 MCG tablet Take 100 mcg by mouth daily before breakfast.     metoprolol  succinate (TOPROL -XL) 50 MG 24 hr tablet Take 1 tablet (50 mg total) by mouth daily. 30 tablet 0   montelukast  (SINGULAIR ) 10 MG tablet Take 10 mg by mouth daily.     nicotine  (NICODERM CQ  - DOSED IN MG/24 HOURS) 21 mg/24hr patch Place 1 patch (21 mg total) onto the skin daily.     nystatin  (MYCOSTATIN ) 100000 UNIT/ML suspension Take 5 mLs (500,000 Units total) by mouth 4 (four) times daily. 473 mL 0   OXYGEN Inhale into the lungs. At night and with exertion     rosuvastatin  (CRESTOR ) 10 MG tablet Take 10 mg by mouth at bedtime.     tamsulosin  (FLOMAX ) 0.4 MG CAPS capsule Take 1 capsule (0.4 mg total) by mouth daily. 90 capsule 3   No current facility-administered medications for this visit.   Facility-Administered Medications Ordered in Other Visits  Medication Dose Route Frequency Provider Last Rate Last Admin   sodium chloride  flush (NS) 0.9 % injection 3 mL  3 mL Intravenous Q12H Bosie Vinie LABOR, MD        Review of Systems  Constitutional: Negative.  Negative for chills, diaphoresis, fever, malaise/fatigue and weight loss (up 10 pound).  HENT: Negative.  Negative for congestion, ear discharge, ear pain, hearing loss, nosebleeds, sinus pain, sore throat and tinnitus.   Eyes: Negative.  Negative for blurred vision, double vision,  photophobia and pain.  Respiratory:  Positive for shortness of breath. Negative for cough, hemoptysis, sputum production and wheezing.   Cardiovascular: Negative.  Negative for chest pain, palpitations, orthopnea, leg swelling and PND.  Gastrointestinal: Negative.  Negative for abdominal pain, blood in stool, constipation, diarrhea, heartburn, melena, nausea and vomiting.  Genitourinary: Negative.  Negative for dysuria, frequency, hematuria and urgency.       LUTS; difficulty initiating stream, voids small amounts, nocturia. Known prostatomegaly and posterior bladder diverticulum; sees urology  Musculoskeletal: Negative.  Negative for back pain, falls, joint pain, myalgias and neck pain.  Skin: Negative.  Negative for itching and rash.  Neurological: Negative.  Negative for dizziness,  tremors, speech change, focal weakness, weakness and headaches.  Endo/Heme/Allergies: Negative.  Does not bruise/bleed easily.       Hypothyroidism on levothyroxine .  Psychiatric/Behavioral:  Negative for depression, memory loss and suicidal ideas. The patient is not nervous/anxious and does not have insomnia.   All other systems reviewed and are negative.   Performance status (ECOG): 0 Vitals:   01/27/24 1435 01/27/24 1443  BP: (!) 115/98 109/77  Pulse: 69   Resp: 18   Temp: (!) 97.4 F (36.3 C)   SpO2: 98%      Physical Exam Constitutional:      General: He is not in acute distress.    Appearance: He is not diaphoretic.  HENT:     Head: Normocephalic and atraumatic.  Eyes:     General: No scleral icterus. Cardiovascular:     Rate and Rhythm: Normal rate and regular rhythm.     Heart sounds: No murmur heard. Pulmonary:     Effort: Pulmonary effort is normal. No respiratory distress.     Breath sounds: No wheezing.     Comments: Decreased breath sounds bilaterally. Abdominal:     General: There is no distension.     Palpations: Abdomen is soft.     Tenderness: There is no abdominal tenderness.   Musculoskeletal:        General: Normal range of motion.     Cervical back: Normal range of motion and neck supple.  Skin:    General: Skin is warm and dry.     Findings: No erythema.  Neurological:     Mental Status: He is alert and oriented to person, place, and time. Mental status is at baseline.     Motor: No abnormal muscle tone.  Psychiatric:        Mood and Affect: Affect normal.    Labs    Latest Ref Rng & Units 10/20/2023    1:37 PM 05/16/2023    7:52 AM 05/15/2023    5:52 AM  CBC  WBC 4.0 - 10.5 K/uL 9.3  11.8  11.6   Hemoglobin 13.0 - 17.0 g/dL 85.4  83.8  84.7   Hematocrit 39.0 - 52.0 % 44.4  49.9  45.9   Platelets 150 - 400 K/uL 179  172  163       Latest Ref Rng & Units 10/20/2023    1:36 PM 05/16/2023    7:52 AM 05/15/2023    5:52 AM  CMP  Glucose 70 - 99 mg/dL 888  879  858   BUN 8 - 23 mg/dL 14  32  27   Creatinine 0.61 - 1.24 mg/dL 9.33  9.23  9.36   Sodium 135 - 145 mmol/L 135  140  139   Potassium 3.5 - 5.1 mmol/L 4.1  4.9  4.8   Chloride 98 - 111 mmol/L 103  97  101   CO2 22 - 32 mmol/L 25  35  29   Calcium  8.9 - 10.3 mg/dL 8.8  9.1  8.9   Total Protein 6.5 - 8.1 g/dL 6.7  5.9  5.6   Total Bilirubin 0.0 - 1.2 mg/dL 0.9  1.1  0.8   Alkaline Phos 38 - 126 U/L 44  34  29   AST 15 - 41 U/L 17  36  20   ALT 0 - 44 U/L 16  63  31    RADIOGRAPHIC STUDIES: I have personally reviewed the radiological images as listed and agreed with the findings in  the report.  CT Chest Wo Contrast Result Date: 01/27/2024 CLINICAL DATA:  Pulmonary nodule follow-up.  History larynx cancer. EXAM: CT CHEST WITHOUT CONTRAST TECHNIQUE: Multidetector CT imaging of the chest was performed following the standard protocol without IV contrast. RADIATION DOSE REDUCTION: This exam was performed according to the departmental dose-optimization program which includes automated exposure control, adjustment of the mA and/or kV according to patient size and/or use of iterative reconstruction  technique. COMPARISON:  Most recent radiograph 09/02/2023, additional priors reviewed. FINDINGS: Cardiovascular: The heart is normal in size. There are coronary artery calcifications. No pericardial effusion. Aortic atherosclerosis without aortic aneurysm. Mediastinum/Nodes: No enlarged mediastinal lymph nodes. No obvious bulky hilar adenopathy, hilar assessment is limited in the absence of IV contrast. Small hiatal hernia. Lungs/Pleura: Again seen moderate to advanced emphysema. Retained mucus in the trachea and central airways. No consolidative airspace disease. The left lower lobe nodule that was new on prior exam is unchanged measuring 8 mm, series 3, image 74. Left upper lobe nodule that was new on prior exam is unchanged measuring 6 mm, series 3, image 39. The 8 mm right lower lobe nodule that was new on prior exam has resolved. Multiple tiny additional pulmonary nodules are stable from prior. Stable area of scarring in the left upper lobe series 3, image 26. no new pulmonary nodule from prior. Upper Abdomen: Gallstones. Abdominal aortic atherosclerosis. No adrenal nodule or acute findings. Musculoskeletal: No lytic or blastic osseous lesions. No acute osseous findings IMPRESSION: 1. Two of the three nodules that were new on prior exam are unchanged over the course of 4 months, largest 8 mm. In the setting of emphysema and prior malignancy, metastasis or primary bronchogenic malignancy are considered. The largest nodule is borderline in size for PET characterization. The remaining lung nodule that was new on prior exam has resolved. 2. Additional tiny nodules are stable from prior. No new pulmonary nodules. 3. Small hiatal hernia. 4. Coronary artery calcifications. 5. Aortic Atherosclerosis (ICD10-I70.0) and Emphysema (ICD10-J43.9). Electronically Signed   By: Andrea Gasman M.D.   On: 01/27/2024 17:31

## 2024-01-27 NOTE — Assessment & Plan Note (Addendum)
#   Stage III supraglottic larynx cancer Clinically he is doing well.  He is 9 year post completion of treatment. Monitor thyroid  function annually.  He recently has sore throat.  Recommend patient follow-up with ENT for further evaluation.

## 2024-01-27 NOTE — Assessment & Plan Note (Signed)
 TSH and T4 were stable, continue synthroid  100mcg daily- managed by Dr. Cleotilde

## 2024-01-27 NOTE — Assessment & Plan Note (Signed)
Discussed about smoke cessation.  Patient is motivated

## 2024-01-27 NOTE — Assessment & Plan Note (Signed)
#   Lung nodules.  Imaging was reviewed and discussed with patient.  05/23/22 CT scan showed New 8 x 5 mm RIGHT upper lobe pulmonary nodule, new right middle lobe nodules, wax and wane other nodules, chronic infection/inflammation vs malignancy 08/29/23 CT showed stable nodules, new lung nodules.   01/22/2024, CT chest showed Two of the three nodules that were new on prior exam are unchanged over the course of 4 months, largest 8 mm.  Other nodules are stable.  Patient has wax and wane lung nodules.  I recommend to repeat CT scan in 6 months

## 2024-01-28 NOTE — Telephone Encounter (Signed)
-----   Message from Zelphia Cap sent at 01/27/2024 10:30 PM EDT ----- Please let patient know that the lung nodules are stable on current CT.  I recommend patient to get a CT chest without contrast done in 6 months.  Prior to lab MD please check CBC CMP TSH. ----- Message ----- From: Interface, Rad Results In Sent: 01/27/2024   5:33 PM EDT To: Zelphia Cap, MD

## 2024-01-28 NOTE — Telephone Encounter (Signed)
 Spoke to [name] and informed him of CT result and MD recommendation. Pt verbalized understanding.   Please schedule and notify pt:  CT chest wo in 6 months  Lab(cbc,cmp,tsh) /MD 5-7 days after CT

## 2024-01-29 ENCOUNTER — Encounter: Payer: Self-pay | Admitting: Oncology

## 2024-02-02 DIAGNOSIS — J040 Acute laryngitis: Secondary | ICD-10-CM | POA: Diagnosis not present

## 2024-02-11 DIAGNOSIS — E782 Mixed hyperlipidemia: Secondary | ICD-10-CM | POA: Diagnosis not present

## 2024-02-11 DIAGNOSIS — E538 Deficiency of other specified B group vitamins: Secondary | ICD-10-CM | POA: Diagnosis not present

## 2024-02-13 ENCOUNTER — Emergency Department

## 2024-02-13 ENCOUNTER — Observation Stay

## 2024-02-13 ENCOUNTER — Inpatient Hospital Stay
Admission: EM | Admit: 2024-02-13 | Discharge: 2024-02-15 | DRG: 189 | Disposition: A | Discharge status: Home / Self Care | Attending: Osteopathic Medicine | Admitting: Osteopathic Medicine

## 2024-02-13 ENCOUNTER — Other Ambulatory Visit: Payer: Self-pay

## 2024-02-13 DIAGNOSIS — J9621 Acute and chronic respiratory failure with hypoxia: Secondary | ICD-10-CM | POA: Diagnosis present

## 2024-02-13 DIAGNOSIS — R0602 Shortness of breath: Secondary | ICD-10-CM | POA: Diagnosis present

## 2024-02-13 DIAGNOSIS — J441 Chronic obstructive pulmonary disease with (acute) exacerbation: Principal | ICD-10-CM | POA: Diagnosis present

## 2024-02-13 DIAGNOSIS — J9602 Acute respiratory failure with hypercapnia: Secondary | ICD-10-CM

## 2024-02-13 DIAGNOSIS — E66811 Obesity, class 1: Secondary | ICD-10-CM | POA: Diagnosis present

## 2024-02-13 DIAGNOSIS — I480 Paroxysmal atrial fibrillation: Secondary | ICD-10-CM | POA: Diagnosis present

## 2024-02-13 DIAGNOSIS — Z7901 Long term (current) use of anticoagulants: Secondary | ICD-10-CM

## 2024-02-13 DIAGNOSIS — Z8249 Family history of ischemic heart disease and other diseases of the circulatory system: Secondary | ICD-10-CM

## 2024-02-13 DIAGNOSIS — Z1152 Encounter for screening for COVID-19: Secondary | ICD-10-CM | POA: Diagnosis not present

## 2024-02-13 DIAGNOSIS — Z801 Family history of malignant neoplasm of trachea, bronchus and lung: Secondary | ICD-10-CM

## 2024-02-13 DIAGNOSIS — I420 Dilated cardiomyopathy: Secondary | ICD-10-CM | POA: Diagnosis present

## 2024-02-13 DIAGNOSIS — J439 Emphysema, unspecified: Secondary | ICD-10-CM | POA: Diagnosis not present

## 2024-02-13 DIAGNOSIS — R918 Other nonspecific abnormal finding of lung field: Secondary | ICD-10-CM | POA: Diagnosis not present

## 2024-02-13 DIAGNOSIS — Z8551 Personal history of malignant neoplasm of bladder: Secondary | ICD-10-CM | POA: Diagnosis not present

## 2024-02-13 DIAGNOSIS — J449 Chronic obstructive pulmonary disease, unspecified: Secondary | ICD-10-CM | POA: Diagnosis present

## 2024-02-13 DIAGNOSIS — I5022 Chronic systolic (congestive) heart failure: Secondary | ICD-10-CM | POA: Diagnosis present

## 2024-02-13 DIAGNOSIS — R042 Hemoptysis: Secondary | ICD-10-CM | POA: Diagnosis present

## 2024-02-13 DIAGNOSIS — D72829 Elevated white blood cell count, unspecified: Secondary | ICD-10-CM | POA: Diagnosis present

## 2024-02-13 DIAGNOSIS — E8729 Other acidosis: Secondary | ICD-10-CM | POA: Diagnosis present

## 2024-02-13 DIAGNOSIS — Z923 Personal history of irradiation: Secondary | ICD-10-CM

## 2024-02-13 DIAGNOSIS — R0603 Acute respiratory distress: Secondary | ICD-10-CM

## 2024-02-13 DIAGNOSIS — J9612 Chronic respiratory failure with hypercapnia: Secondary | ICD-10-CM | POA: Diagnosis present

## 2024-02-13 DIAGNOSIS — E039 Hypothyroidism, unspecified: Secondary | ICD-10-CM | POA: Diagnosis present

## 2024-02-13 DIAGNOSIS — D649 Anemia, unspecified: Secondary | ICD-10-CM | POA: Diagnosis present

## 2024-02-13 DIAGNOSIS — E785 Hyperlipidemia, unspecified: Secondary | ICD-10-CM | POA: Diagnosis present

## 2024-02-13 DIAGNOSIS — Z86711 Personal history of pulmonary embolism: Secondary | ICD-10-CM

## 2024-02-13 DIAGNOSIS — N400 Enlarged prostate without lower urinary tract symptoms: Secondary | ICD-10-CM | POA: Diagnosis present

## 2024-02-13 DIAGNOSIS — Z87891 Personal history of nicotine dependence: Secondary | ICD-10-CM | POA: Diagnosis not present

## 2024-02-13 DIAGNOSIS — I10 Essential (primary) hypertension: Secondary | ICD-10-CM | POA: Diagnosis not present

## 2024-02-13 DIAGNOSIS — Z9981 Dependence on supplemental oxygen: Secondary | ICD-10-CM

## 2024-02-13 DIAGNOSIS — I2510 Atherosclerotic heart disease of native coronary artery without angina pectoris: Secondary | ICD-10-CM | POA: Diagnosis present

## 2024-02-13 DIAGNOSIS — Z79890 Hormone replacement therapy (postmenopausal): Secondary | ICD-10-CM | POA: Diagnosis not present

## 2024-02-13 DIAGNOSIS — I509 Heart failure, unspecified: Secondary | ICD-10-CM | POA: Diagnosis not present

## 2024-02-13 DIAGNOSIS — J432 Centrilobular emphysema: Secondary | ICD-10-CM

## 2024-02-13 DIAGNOSIS — Z6829 Body mass index (BMI) 29.0-29.9, adult: Secondary | ICD-10-CM

## 2024-02-13 DIAGNOSIS — Z79899 Other long term (current) drug therapy: Secondary | ICD-10-CM | POA: Diagnosis not present

## 2024-02-13 DIAGNOSIS — I110 Hypertensive heart disease with heart failure: Secondary | ICD-10-CM | POA: Diagnosis present

## 2024-02-13 DIAGNOSIS — Z8521 Personal history of malignant neoplasm of larynx: Secondary | ICD-10-CM

## 2024-02-13 LAB — COMPREHENSIVE METABOLIC PANEL WITH GFR
ALT: 16 U/L (ref 0–44)
AST: 15 U/L (ref 15–41)
Albumin: 3 g/dL — ABNORMAL LOW (ref 3.5–5.0)
Alkaline Phosphatase: 29 U/L — ABNORMAL LOW (ref 38–126)
Anion gap: 7 (ref 5–15)
BUN: 24 mg/dL — ABNORMAL HIGH (ref 8–23)
CO2: 31 mmol/L (ref 22–32)
Calcium: 8.1 mg/dL — ABNORMAL LOW (ref 8.9–10.3)
Chloride: 104 mmol/L (ref 98–111)
Creatinine, Ser: 0.84 mg/dL (ref 0.61–1.24)
GFR, Estimated: >60 mL/min (ref >60)
Glucose, Bld: 166 mg/dL — ABNORMAL HIGH (ref 70–99)
Potassium: 4.7 mmol/L (ref 3.5–5.1)
Sodium: 142 mmol/L (ref 135–145)
Total Bilirubin: 0.2 mg/dL (ref 0.0–1.2)
Total Protein: 5.5 g/dL — ABNORMAL LOW (ref 6.5–8.1)

## 2024-02-13 LAB — URINALYSIS, COMPLETE (UACMP) WITH MICROSCOPIC
Bacteria, UA: NONE SEEN
Bilirubin Urine: NEGATIVE
Glucose, UA: NEGATIVE mg/dL
Hgb urine dipstick: NEGATIVE
Ketones, ur: NEGATIVE mg/dL
Leukocytes,Ua: NEGATIVE
Nitrite: NEGATIVE
Protein, ur: NEGATIVE mg/dL
RBC / HPF: 0 RBC/hpf (ref 0–5)
Specific Gravity, Urine: 1.017 (ref 1.005–1.030)
Squamous Epithelial / HPF: 0 /HPF (ref 0–5)
pH: 6 (ref 5.0–8.0)

## 2024-02-13 LAB — EXPECTORATED SPUTUM ASSESSMENT W GRAM STAIN, RFLX TO RESP C

## 2024-02-13 LAB — CBC WITH DIFFERENTIAL/PLATELET
Abs Immature Granulocytes: 0.16 K/uL — ABNORMAL HIGH (ref 0.00–0.07)
Basophils Absolute: 0.1 K/uL (ref 0.0–0.1)
Basophils Relative: 1 %
Eosinophils Absolute: 0.3 K/uL (ref 0.0–0.5)
Eosinophils Relative: 2 %
HCT: 37.3 % — ABNORMAL LOW (ref 39.0–52.0)
Hemoglobin: 11.4 g/dL — ABNORMAL LOW (ref 13.0–17.0)
Immature Granulocytes: 1 %
Lymphocytes Relative: 31 %
Lymphs Abs: 5.7 K/uL — ABNORMAL HIGH (ref 0.7–4.0)
MCH: 28.6 pg (ref 26.0–34.0)
MCHC: 30.6 g/dL (ref 30.0–36.0)
MCV: 93.5 fL (ref 80.0–100.0)
Monocytes Absolute: 1.8 K/uL — ABNORMAL HIGH (ref 0.1–1.0)
Monocytes Relative: 10 %
Neutro Abs: 10.3 K/uL — ABNORMAL HIGH (ref 1.7–7.7)
Neutrophils Relative %: 55 %
Platelets: 217 K/uL (ref 150–400)
RBC: 3.99 MIL/uL — ABNORMAL LOW (ref 4.22–5.81)
RDW: 13.6 % (ref 11.5–15.5)
WBC: 18.4 K/uL — ABNORMAL HIGH (ref 4.0–10.5)
nRBC: 0 % (ref 0.0–0.2)

## 2024-02-13 LAB — HEMOGLOBIN AND HEMATOCRIT, BLOOD
HCT: 33.2 % — ABNORMAL LOW (ref 39.0–52.0)
Hemoglobin: 10.5 g/dL — ABNORMAL LOW (ref 13.0–17.0)

## 2024-02-13 LAB — LACTIC ACID, PLASMA
Lactic Acid, Venous: 1.3 mmol/L (ref 0.5–1.9)
Lactic Acid, Venous: 1.9 mmol/L (ref 0.5–1.9)

## 2024-02-13 LAB — RESP PANEL BY RT-PCR (RSV, FLU A&B, COVID)  RVPGX2
Influenza A by PCR: NEGATIVE
Influenza B by PCR: NEGATIVE
Resp Syncytial Virus by PCR: NEGATIVE
SARS Coronavirus 2 by RT PCR: NEGATIVE

## 2024-02-13 LAB — RESPIRATORY PANEL BY PCR

## 2024-02-13 LAB — TROPONIN I (HIGH SENSITIVITY)
Troponin I (High Sensitivity): 7 ng/L (ref <18)
Troponin I (High Sensitivity): 7 ng/L (ref <18)

## 2024-02-13 LAB — CBC
HCT: 34.6 % — ABNORMAL LOW (ref 39.0–52.0)
Hemoglobin: 10.7 g/dL — ABNORMAL LOW (ref 13.0–17.0)
MCH: 28.5 pg (ref 26.0–34.0)
MCHC: 30.9 g/dL (ref 30.0–36.0)
MCV: 92 fL (ref 80.0–100.0)
Platelets: 197 K/uL (ref 150–400)
RBC: 3.76 MIL/uL — ABNORMAL LOW (ref 4.22–5.81)
RDW: 13.9 % (ref 11.5–15.5)
WBC: 16 K/uL — ABNORMAL HIGH (ref 4.0–10.5)
nRBC: 0 % (ref 0.0–0.2)

## 2024-02-13 LAB — PROCALCITONIN: Procalcitonin: <0.1 ng/mL

## 2024-02-13 LAB — BLOOD GAS, VENOUS

## 2024-02-13 LAB — BRAIN NATRIURETIC PEPTIDE: B Natriuretic Peptide: 69.3 pg/mL (ref 0.0–100.0)

## 2024-02-13 MED ORDER — DOXYCYCLINE HYCLATE 100 MG PO TABS
100.0000 mg | ORAL_TABLET | Freq: Two times a day (BID) | ORAL | Status: DC
  Administered 2024-02-13 – 2024-02-15 (×5): 100 mg via ORAL
  Filled 2024-02-13 (×5): qty 1

## 2024-02-13 MED ORDER — MONTELUKAST SODIUM 10 MG PO TABS
10.0000 mg | ORAL_TABLET | Freq: Every day | ORAL | Status: DC
  Administered 2024-02-13 – 2024-02-15 (×3): 10 mg via ORAL
  Filled 2024-02-13 (×3): qty 1

## 2024-02-13 MED ORDER — SODIUM CHLORIDE 0.9 % IV SOLN
500.0000 mg | Freq: Once | INTRAVENOUS | Status: AC
  Administered 2024-02-13: 500 mg via INTRAVENOUS
  Filled 2024-02-13 (×3): qty 5

## 2024-02-13 MED ORDER — ONDANSETRON HCL 4 MG PO TABS: 4.0000 mg | ORAL_TABLET | Freq: Four times a day (QID) | ORAL | Status: DC | PRN

## 2024-02-13 MED ORDER — APIXABAN 5 MG PO TABS
5.0000 mg | ORAL_TABLET | Freq: Two times a day (BID) | ORAL | Status: DC
  Administered 2024-02-13: 5 mg via ORAL
  Filled 2024-02-13: qty 1

## 2024-02-13 MED ORDER — LEVOTHYROXINE SODIUM 100 MCG PO TABS
100.0000 ug | ORAL_TABLET | Freq: Every day | ORAL | Status: DC
  Administered 2024-02-14 – 2024-02-15 (×2): 100 ug via ORAL
  Filled 2024-02-13 (×2): qty 1

## 2024-02-13 MED ORDER — LACTATED RINGERS IV BOLUS
1000.0000 mL | Freq: Once | INTRAVENOUS | Status: AC
  Administered 2024-02-13: 1000 mL via INTRAVENOUS

## 2024-02-13 MED ORDER — ROSUVASTATIN CALCIUM 10 MG PO TABS
10.0000 mg | ORAL_TABLET | Freq: Every day | ORAL | Status: DC
  Administered 2024-02-13 – 2024-02-14 (×2): 10 mg via ORAL
  Filled 2024-02-13 (×3): qty 1

## 2024-02-13 MED ORDER — METOPROLOL SUCCINATE ER 50 MG PO TB24
50.0000 mg | ORAL_TABLET | Freq: Every day | ORAL | Status: DC
  Administered 2024-02-13 – 2024-02-14 (×2): 50 mg via ORAL
  Filled 2024-02-13 (×2): qty 1

## 2024-02-13 MED ORDER — EZETIMIBE 10 MG PO TABS
10.0000 mg | ORAL_TABLET | Freq: Every day | ORAL | Status: DC
  Administered 2024-02-13 – 2024-02-15 (×3): 10 mg via ORAL
  Filled 2024-02-13 (×3): qty 1

## 2024-02-13 MED ORDER — ORAL CARE MOUTH RINSE
15.0000 mL | OROMUCOSAL | Status: DC
  Administered 2024-02-14 – 2024-02-15 (×6): 15 mL via OROMUCOSAL

## 2024-02-13 MED ORDER — ALPRAZOLAM 0.5 MG PO TABS
0.5000 mg | ORAL_TABLET | Freq: Two times a day (BID) | ORAL | Status: DC
  Administered 2024-02-13 – 2024-02-15 (×5): 0.5 mg via ORAL
  Filled 2024-02-13 (×5): qty 1

## 2024-02-13 MED ORDER — PREDNISONE 20 MG PO TABS
40.0000 mg | ORAL_TABLET | Freq: Every day | ORAL | Status: DC
  Administered 2024-02-14 – 2024-02-15 (×2): 40 mg via ORAL
  Filled 2024-02-13 (×2): qty 2

## 2024-02-13 MED ORDER — BUDESON-GLYCOPYRROL-FORMOTEROL 160-9-4.8 MCG/ACT IN AERO
2.0000 | INHALATION_SPRAY | Freq: Two times a day (BID) | RESPIRATORY_TRACT | Status: DC
  Administered 2024-02-13 – 2024-02-15 (×3): 2 via RESPIRATORY_TRACT
  Filled 2024-02-13 (×2): qty 5.9

## 2024-02-13 MED ORDER — ACETAMINOPHEN 325 MG PO TABS
650.0000 mg | ORAL_TABLET | Freq: Four times a day (QID) | ORAL | Status: DC | PRN
  Administered 2024-02-13: 650 mg via ORAL
  Filled 2024-02-13: qty 2

## 2024-02-13 MED ORDER — ALBUTEROL SULFATE (2.5 MG/3ML) 0.083% IN NEBU
3.0000 mL | INHALATION_SOLUTION | RESPIRATORY_TRACT | Status: DC | PRN
  Administered 2024-02-13: 3 mL via RESPIRATORY_TRACT
  Filled 2024-02-13: qty 3

## 2024-02-13 MED ORDER — ONDANSETRON HCL 4 MG/2ML IJ SOLN: 4.0000 mg | Freq: Four times a day (QID) | INTRAMUSCULAR | Status: DC | PRN

## 2024-02-13 MED ORDER — SENNOSIDES-DOCUSATE SODIUM 8.6-50 MG PO TABS: 1.0000 | ORAL_TABLET | Freq: Every evening | ORAL | Status: DC | PRN

## 2024-02-13 MED ORDER — IPRATROPIUM-ALBUTEROL 0.5-2.5 (3) MG/3ML IN SOLN
3.0000 mL | Freq: Four times a day (QID) | RESPIRATORY_TRACT | Status: DC
  Administered 2024-02-13 – 2024-02-15 (×8): 3 mL via RESPIRATORY_TRACT
  Filled 2024-02-13 (×8): qty 3

## 2024-02-13 MED ORDER — GABAPENTIN 100 MG PO CAPS
100.0000 mg | ORAL_CAPSULE | Freq: Every day | ORAL | Status: DC
  Administered 2024-02-13 – 2024-02-15 (×3): 100 mg via ORAL
  Filled 2024-02-13 (×3): qty 1

## 2024-02-13 MED ORDER — SODIUM CHLORIDE 0.9 % IV SOLN
1.0000 g | Freq: Once | INTRAVENOUS | Status: AC
  Administered 2024-02-13: 1 g via INTRAVENOUS
  Filled 2024-02-13: qty 10

## 2024-02-13 MED ORDER — ORAL CARE MOUTH RINSE: 15.0000 mL | OROMUCOSAL | Status: DC | PRN

## 2024-02-13 MED ORDER — IPRATROPIUM-ALBUTEROL 0.5-2.5 (3) MG/3ML IN SOLN
6.0000 mL | Freq: Once | RESPIRATORY_TRACT | Status: AC
  Administered 2024-02-13: 6 mL via RESPIRATORY_TRACT
  Filled 2024-02-13: qty 6

## 2024-02-13 MED ORDER — TAMSULOSIN HCL 0.4 MG PO CAPS
0.4000 mg | ORAL_CAPSULE | Freq: Every day | ORAL | Status: DC
  Administered 2024-02-13 – 2024-02-15 (×3): 0.4 mg via ORAL
  Filled 2024-02-13 (×3): qty 1

## 2024-02-13 MED ORDER — METHYLPREDNISOLONE SODIUM SUCC 40 MG IJ SOLR
40.0000 mg | Freq: Two times a day (BID) | INTRAMUSCULAR | Status: AC
  Administered 2024-02-13 (×2): 40 mg via INTRAVENOUS
  Filled 2024-02-13 (×2): qty 1

## 2024-02-13 NOTE — H&P (Signed)
 History and Physical    Benjamin Torres FMW:[id number] DOB: Sep 05, 1955 DOA: 02/13/2024  PCP: Cleotilde Oneil FALCON, MD (Confirm with patient/family/NH records and if not entered, this has to be entered at [facility] point of entry) Patient coming from: Home  I have personally briefly reviewed patient's old medical records in [facility]  Chief Complaint: Cough, wheezing, shortness of breath  HPI: Benjamin Torres is a 68 y.o. 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  Symptoms started 2 days ago, patient started to have dry cough wheezing and increasing exertional dyspnea, denies any chest pain no fever or chills.  No nauseous vomiting or diarrhea.  He has been taking oral colchicine no labs with minimal improvement and could not sleep last night and decided to come to ED.  ED Course: Afebrile, nontachycardic blood pressure 99/79 O2 saturation 100% on room air.  Patient was found to be febrile tachypneic, on BiPAP ordered.  X-ray showed no acute infiltrates.  Blood work showed WBC 18.5, VBG showed 7.29/71/50.  Patient was given multiple rounds of DuoNebs 1 L IV fluid, ceftriaxone  and azithromycin  in the ED.  Review of Systems: As per HPI otherwise 14 point review of systems negative.    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  Has, does NOT wear   Wears dentures     Past Surgical History:  Procedure Laterality Date   ANAL FISSURE REPAIR     APPENDECTOMY     BIOPSY PHARYNX  2015   Larynx BX   CATARACT EXTRACTION W/PHACO Right 09/03/2023   Procedure: PHACOEMULSIFICATION, CATARACT, WITH IOL INSERTION 4.97 00:30.5;  Surgeon: Mittie Gaskin, MD;  Location: [facility];  Service: Ophthalmology;  Laterality: Right;   COLONOSCOPY WITH PROPOFOL  N/A 03/07/2017   Procedure: COLONOSCOPY WITH PROPOFOL ;  Surgeon: Jinny Carmine, MD;  Location: [facility];  Service: Endoscopy;  Laterality: N/A;   ESOPHAGOGASTRODUODENOSCOPY (EGD) WITH PROPOFOL  N/A 03/07/2017   Procedure: ESOPHAGOGASTRODUODENOSCOPY (EGD) WITH PROPOFOL ;  Surgeon: Jinny Carmine, MD;  Location: [facility];  Service: Endoscopy;  Laterality: N/A;   LEFT HEART CATH AND CORONARY ANGIOGRAPHY Left 09/23/2019   Procedure: LEFT HEART CATH AND CORONARY ANGIOGRAPHY;  Surgeon: Bosie Vinie LABOR, MD;  Location: [facility];  Service: Cardiovascular;  Laterality: Left;   POLYPECTOMY  03/07/2017   Procedure: POLYPECTOMY;  Surgeon: Jinny Carmine, MD;  Location: [facility];  Service: Endoscopy;;     reports that he quit smoking about 10 months ago. His smoking use included e-cigarettes and cigarettes. He started smoking about 50 years ago. He has a 74.9 pack-year smoking history. He has never used smokeless tobacco. He reports that he does not drink alcohol and does not  use drugs.  No Known Allergies  Family History  Problem Relation Age of Onset   CAD Mother    Cancer - Lung Father      Prior to Admission medications   Medication Sig Start Date End Date Taking? Authorizing Provider  acetaminophen  (TYLENOL ) 325 MG tablet Take 2 tablets (650 mg total) by mouth every 6 (six) hours as needed for mild pain (pain score 1-3). 05/16/23  Yes Jens Durand, MD  albuterol  (PROVENTIL  HFA;VENTOLIN  HFA) 108 (90 Base) MCG/ACT inhaler Inhale 1 puff into the lungs every 4 (four) hours as needed for wheezing or shortness of breath.   Yes [provider]  albuterol  (PROVENTIL ) (2.5 MG/3ML) 0.083% nebulizer solution Take 3 mLs (2.5 mg total) by nebulization every 6 (six) hours as needed for wheezing or shortness of breath. 05/16/23  Yes Jens Durand, MD  ALPRAZolam  (XANAX ) 0.5 MG tablet Take 0.5 mg by mouth 2 (two) times daily.   Yes [provider]  apixaban  (ELIQUIS ) 5 MG TABS tablet Take 1 tablet (5 mg total) by mouth 2 (two) times daily. 05/16/23  Yes Jens Durand, MD  ezetimibe  (ZETIA ) 10 MG tablet Take 1 tablet (10 mg total) by mouth daily. 02/10/23 02/13/24 Yes Gollan, Timothy J, MD  fluconazole (DIFLUCAN) 200 MG tablet Take 200 mg by mouth daily. 02/04/24  Yes [provider]  gabapentin  (NEURONTIN ) 100 MG capsule Take 100 mg by mouth daily. 05/27/22  Yes [provider]  levothyroxine  (SYNTHROID ) 100 MCG tablet Take 100 mcg by mouth daily before breakfast.   Yes [provider]  metoprolol  succinate (TOPROL -XL) 50 MG 24 hr tablet Take 1 tablet (50 mg total) by mouth daily. 05/16/23  Yes Jens Durand, MD  montelukast  (SINGULAIR ) 10 MG tablet Take 10 mg by mouth daily. 05/07/22  Yes [provider]  rosuvastatin  (CRESTOR ) 10 MG tablet Take 10 mg by mouth at bedtime. 07/12/20  Yes [provider]  tamsulosin  (FLOMAX ) 0.4 MG CAPS capsule Take 1 capsule (0.4 mg total) by mouth daily. 09/22/18  Yes Francisca Redell BROCKS, MD  Budeson-Glycopyrrol-Formoterol (BREZTRI  AEROSPHERE) 160-9-4.8 MCG/ACT AERO Inhale 2 puffs into the lungs in the morning and at bedtime. Patient not taking: Reported on 02/13/2024 02/03/23   Isadora Hose, MD  cyanocobalamin  (,VITAMIN B-12,) 1000 MCG/ML injection Inject 1,000 mcg into the muscle every 30 (thirty) days. 10/23/21   [provider]  nicotine  (NICODERM CQ  -  DOSED IN MG/24 HOURS) 21 mg/24hr patch Place 1 patch (21 mg total) onto the skin daily. 05/17/23   Jens Durand, MD  nystatin  (MYCOSTATIN ) 100000 UNIT/ML suspension Take 5 mLs (500,000 Units total) by mouth 4 (four) times daily. Patient not taking: Reported on 02/13/2024 10/20/23   Babara Call, MD  OXYGEN Inhale into the lungs. At night and with exertion    [provider]    Physical Exam: Vitals:   02/13/24 0610 02/13/24 0611 02/13/24 0700 02/13/24 0714  BP: 99/79  96/76   Pulse: 70  73   Resp: 20  18   Temp: 98.5 F (36.9 C)     TempSrc: Rectal     SpO2: 100%  100% 100%  Weight:  84 kg    Height:  5' 6 (1.676 m)      Constitutional: NAD, calm, comfortable Vitals:   02/13/24 0610 02/13/24 0611 02/13/24 0700 02/13/24 0714  BP: 99/79  96/76   Pulse: 70  73   Resp: 20  18   Temp: 98.5 F (36.9 C)  TempSrc: Rectal     SpO2: 100%  100% 100%  Weight:  84 kg    Height:  5' 6 (1.676 m)     Eyes: PERRL, lids and conjunctivae normal ENMT: Mucous membranes are moist. Posterior pharynx clear of any exudate or lesions.Normal dentition.  Neck: normal, supple, no masses, no thyromegaly Respiratory: Diminished breath sound bilaterally, scattered wheezing, no crackles.  Increasing respiratory effort. No accessory muscle use.  Cardiovascular: Regular rate and rhythm, no murmurs / rubs / gallops. No extremity edema. 2+ pedal pulses. No carotid bruits.  Abdomen: no tenderness, no masses palpated. No hepatosplenomegaly. Bowel sounds positive.  Musculoskeletal: no clubbing / cyanosis. No joint deformity upper and lower extremities. Good ROM, no contractures. Normal muscle tone.  Skin: no rashes, lesions, ulcers. No induration Neurologic: CN 2-12 grossly intact. Sensation intact, DTR normal. Strength 5/5 in all 4.  Psychiatric: Normal judgment and insight. Alert and oriented x 3. Normal mood.    Labs on Admission: I have personally reviewed following labs and imaging  studies  CBC: Recent Labs  Lab 02/13/24 0614  WBC 18.4*  NEUTROABS 10.3*  HGB 11.4*  HCT 37.3*  MCV 93.5  PLT 217   Basic Metabolic Panel: Recent Labs  Lab 02/13/24 0614  NA 142  K 4.7  CL 104  CO2 31  GLUCOSE 166*  BUN 24*  CREATININE 0.84  CALCIUM  8.1*   GFR: Estimated Creatinine Clearance: 84.4 mL/min (by C-G formula based on SCr of 0.84 mg/dL). Liver Function Tests: Recent Labs  Lab 02/13/24 0614  AST 15  ALT 16  ALKPHOS 29*  BILITOT 0.2  PROT 5.5*  ALBUMIN 3.0*   No results for input(s): LIPASE, AMYLASE in the last 168 hours. No results for input(s): AMMONIA in the last 168 hours. Coagulation Profile: No results for input(s): INR, PROTIME in the last 168 hours. Cardiac Enzymes: No results for input(s): CKTOTAL, CKMB, CKMBINDEX, TROPONINI in the last 168 hours. BNP (last 3 results) No results for input(s): PROBNP in the last 8760 hours. HbA1C: No results for input(s): HGBA1C in the last 72 hours. CBG: No results for input(s): GLUCAP in the last 168 hours. Lipid Profile: No results for input(s): CHOL, HDL, LDLCALC, TRIG, CHOLHDL, LDLDIRECT in the last 72 hours. Thyroid  Function Tests: No results for input(s): TSH, T4TOTAL, FREET4, T3FREE, THYROIDAB in the last 72 hours. Anemia Panel: No results for input(s): VITAMINB12, FOLATE, FERRITIN, TIBC, IRON, RETICCTPCT in the last 72 hours. Urine analysis:    Component Value Date/Time   COLORURINE YELLOW (A) 08/09/2020 1046   APPEARANCEUR CLEAR (A) 08/09/2020 1046   APPEARANCEUR Clear 03/06/2017 1414   LABSPEC 1.018 08/09/2020 1046   PHURINE 5.0 08/09/2020 1046   GLUCOSEU NEGATIVE 08/09/2020 1046   HGBUR SMALL (A) 08/09/2020 1046   BILIRUBINUR NEGATIVE 08/09/2020 1046   BILIRUBINUR Negative 03/06/2017 1414   KETONESUR 20 (A) 08/09/2020 1046   PROTEINUR 100 (A) 08/09/2020 1046   NITRITE NEGATIVE 08/09/2020 1046   LEUKOCYTESUR NEGATIVE 08/09/2020  1046    Radiological Exams on Admission: DG Chest Port 1 View Result Date: 02/13/2024 EXAM: 1 VIEW(S) XRAY OF THE CHEST 02/13/2024 06:28:00 AM COMPARISON: Chest CT without contrast 01/22/2024. CLINICAL HISTORY: SOB, Hx of COPD. FINDINGS: LUNGS AND PLEURA: The lungs are emphysematous but radiographically clear. Scattered pulmonary nodules described in the chest CT report are not visible on this radiograph. Follow up as indicated. No pulmonary edema. No pleural effusion. No pneumothorax. HEART AND MEDIASTINUM: No acute abnormality of the cardiac and mediastinal silhouettes.  BONES AND SOFT TISSUES: No acute osseous abnormality. IMPRESSION: 1. Emphysematous lungs, radiographically clear. 2. Pulmonary nodules seen on prior CT are not visualized on this radiograph; recommend CT-based follow-up as clinically warranted. Electronically signed by: Francis Quam MD 02/13/2024 06:36 AM EDT RP Workstation: [id number]    EKG: Independently reviewed.  Sinus rhythm, no acute ST changes.  Assessment/Plan Principal Problem:   COPD (chronic obstructive pulmonary disease) (HCC) Active Problems:   COPD exacerbation (HCC)  (please populate well all problems here in Problem List. (For example, if patient is on BP meds at home and you resume or decide to hold them, it is a problem that needs to be her. Same for CAD, COPD, HLD and so on)  Acute COPD exacerbation Acute hypoxic respiratory failure Compensated chronic respiratory acidosis - To relieve breathing effort, start patient on BiPAP, titrate according to symptoms. - Add IV Solu-Medrol  twice daily, Prejean for p.o. prednisone  - Continue Breztri  - Continue DuoNebs every 6 hours and as needed albuterol  - Incentive spirometry  SIRS - He has tachycardia he has leukocytosis, x-ray however showed no acute infiltrates.  Might have atypical infection.  Switch antibiotic coverage to doxycycline.  Check respiratory panel.  Chronic HFrEF - Euvolemic, no S/S of  cardiac wheezing - Blood pressure borderline low, not on BP meds at this time.  PAF - In sinus rhythm - Continue Eliquis  and metoprolol   Hypothyroidism - Continue Synthroid   BPH - No urinary symptoms. Check UA - Continue Flomax     DVT prophylaxis: Eliquis  Code Status: Full code Family Communication: None at bedside Disposition Plan: Expect less than 2 midnight hospital stay Consults called: None Admission status: PCU observation   Cort ONEIDA Mana MD [facility] Pager [phone]  02/13/2024, 8:32 AM

## 2024-02-13 NOTE — ED Notes (Signed)
 Per Admit Md pt ok to come off BIPAP for PO meds when ordered

## 2024-02-13 NOTE — ED Notes (Signed)
 RT at bedside adjusting settings due to pt stating it was too much and he couldn't breath.

## 2024-02-13 NOTE — ED Notes (Signed)
 MD Zhang and RT at bedside to assess patient. MD Zhang verbally ordered for patient to be placed back on the BiPAP due to increased work of breathing and diminished lung sounds. RT aware.

## 2024-02-13 NOTE — ED Notes (Signed)
 This RN assisted patient with using the bedside commode x2 within the past 30 minutes. Patient had two episodes of black liquid stools. This RN also noted patient to have increased work of breathing. This RN administered a breathing treatment but not positive results were shown. MD Zhang notified.

## 2024-02-13 NOTE — ED Notes (Signed)
 Bradler MD made aware Co2 71, O2 19

## 2024-02-13 NOTE — ED Provider Notes (Signed)
 [facility] Provider Note   Event Date/Time   First MD Initiated Contact with Patient 02/13/24 0600     (approximate) History  Shortness of Breath  HPI Benjamin Torres is a 68 y.o. male with a past medical history of COPD who presents via EMS with concerns for shortness of breath and respiratory distress.  Patient was reportedly tripoding with tachypnea however satting at 98% on room air.  Patient had rhonchi and wheezing throughout all lung fields.  Patient has received 2 DuoNebs and 125 Solu-Medrol  prior to arrival with what patient reports is improvement in his symptoms.  Patient states that he has been having worsening cough, shortness of breath, and productive cough over the last 2 days with associated bloody sputum.  Patient has also had episodes of vomiting over the last 2 days.  EMS reports that patient's wife stated that he had an episode of hematemesis yesterday. ROS: Patient currently denies any vision changes, tinnitus, difficulty speaking, facial droop, sore throat, abdominal pain, nausea/vomiting/diarrhea, dysuria, or weakness/numbness/paresthesias in any extremity   Physical Exam  Triage Vital Signs: ED Triage Vitals  Encounter Vitals Group     BP      Girls Systolic BP Percentile      Girls Diastolic BP Percentile      Boys Systolic BP Percentile      Boys Diastolic BP Percentile      Pulse      Resp      Temp      Temp src      SpO2      Weight      Height      Head Circumference      Peak Flow      Pain Score      Pain Loc      Pain Education      Exclude from Growth Chart    Most recent vital signs: Vitals:   02/13/24 0609 02/13/24 0610  BP:  99/79  Pulse:  70  Resp:  20  Temp:  98.5 F (36.9 C)  SpO2: 100% 100%   General: Awake, oriented x4. CV:  Good peripheral perfusion. Resp:  Increased effort.  Inspiratory and extra wheezing over bilateral lung fields Abd:  No distention. Other:  Elderly well-developed, well-nourished  Caucasian male resting in stretcher with nebulizer in progress and in mild respiratory distress ED Results / Procedures / Treatments  Labs (all labs ordered are listed, but only abnormal results are displayed) Labs Reviewed  COMPREHENSIVE METABOLIC PANEL WITH GFR - Abnormal; Notable for the following components:      Result Value   Glucose, Bld 166 (*)    BUN 24 (*)    Calcium  8.1 (*)    Total Protein 5.5 (*)    Albumin 3.0 (*)    Alkaline Phosphatase 29 (*)    All other components within normal limits  CBC WITH DIFFERENTIAL/PLATELET - Abnormal; Notable for the following components:   WBC 18.4 (*)    RBC 3.99 (*)    Hemoglobin 11.4 (*)    HCT 37.3 (*)    Neutro Abs 10.3 (*)    Lymphs Abs 5.7 (*)    Monocytes Absolute 1.8 (*)    Abs Immature Granulocytes 0.16 (*)    All other components within normal limits  BLOOD GAS, VENOUS - Abnormal; Notable for the following components:   pCO2, Ven 71 (*)    pO2, Ven <31 (*)    Bicarbonate 34.1 (*)  Acid-Base Excess 5.1 (*)    All other components within normal limits  RESP PANEL BY RT-PCR (RSV, FLU A&B, COVID)  RVPGX2  CULTURE, BLOOD (ROUTINE X 2)  CULTURE, BLOOD (ROUTINE X 2)  LACTIC ACID, PLASMA  LACTIC ACID, PLASMA  BRAIN NATRIURETIC PEPTIDE  TROPONIN I (HIGH SENSITIVITY)   EKG ED ECG REPORT I, Artist MARLA Kerns, the attending physician, personally viewed and interpreted this ECG. Date: 02/13/2024 EKG Time: 0612 Rate: 69 Rhythm: normal sinus rhythm QRS Axis: normal Intervals: normal ST/T Wave abnormalities: normal Narrative Interpretation: no evidence of acute ischemia RADIOLOGY ED MD interpretation: Single view chest x-ray shows emphysematous lungs - All radiology independently interpreted and agree with radiology assessment Official radiology report(s): DG Chest Port 1 View Result Date: 02/13/2024 EXAM: 1 VIEW(S) XRAY OF THE CHEST 02/13/2024 06:28:00 AM COMPARISON: Chest CT without contrast 01/22/2024. CLINICAL HISTORY:  SOB, Hx of COPD. FINDINGS: LUNGS AND PLEURA: The lungs are emphysematous but radiographically clear. Scattered pulmonary nodules described in the chest CT report are not visible on this radiograph. Follow up as indicated. No pulmonary edema. No pleural effusion. No pneumothorax. HEART AND MEDIASTINUM: No acute abnormality of the cardiac and mediastinal silhouettes. BONES AND SOFT TISSUES: No acute osseous abnormality. IMPRESSION: 1. Emphysematous lungs, radiographically clear. 2. Pulmonary nodules seen on prior CT are not visualized on this radiograph; recommend CT-based follow-up as clinically warranted. Electronically signed by: Francis Quam MD 02/13/2024 06:36 AM EDT RP Workstation: [id number]   PROCEDURES: Critical Care performed: Yes, see critical care procedure note(s) Procedures CRITICAL CARE Performed by: Ivey Nembhard K Adlynn Lowenstein  Total critical care time: 41 minutes  Critical care time was exclusive of separately billable procedures and treating other patients.  Critical care was necessary to treat or prevent imminent or life-threatening deterioration.  Critical care was time spent personally by me on the following activities: development of treatment plan with patient and/or surrogate as well as nursing, discussions with consultants, evaluation of patient's response to treatment, examination of patient, obtaining history from patient or surrogate, ordering and performing treatments and interventions, ordering and review of laboratory studies, ordering and review of radiographic studies, pulse oximetry and re-evaluation of patient's condition.  MEDICATIONS ORDERED IN ED: Medications  cefTRIAXone  (ROCEPHIN ) 1 g in sodium chloride  0.9 % 100 mL IVPB (1 g Intravenous New Bag/Given 02/13/24 9347)  azithromycin  (ZITHROMAX ) 500 mg in sodium chloride  0.9 % 250 mL IVPB (has no administration in time range)  ipratropium-albuterol  (DUONEB) 0.5-2.5 (3) MG/3ML nebulizer solution 6 mL (6 mLs Nebulization Given  02/13/24 0615)  lactated ringers  bolus 1,000 mL (1,000 mLs Intravenous New Bag/Given 02/13/24 0616)   IMPRESSION / MDM / ASSESSMENT AND PLAN / ED COURSE  I reviewed the triage vital signs and the nursing notes.                             The patient is on the cardiac monitor to evaluate for evidence of arrhythmia and/or significant heart rate changes. Patient's presentation is most consistent with acute presentation with potential threat to life or bodily function. Patient is a 68 year old male with the above-stated past medical history that presents via EMS for worsening shortness of breath, productive cough, and respiratory distress.  Patient received 2 DuoNebs and 125 Solu-Medrol  prior to arrival. DDx: COPD exacerbation, CHF exacerbation, ACS, pneumonia Plan: CBC, CMP, BNP, lactic acid, troponin, VBG, blood culture, RVP, EKG, chest x-ray  Given patient's respiratory distress, leukocytosis, and hypercapnia, he will require  admission to the internal medicine service for further evaluation and management.  Dispo: Admit to medicine   FINAL CLINICAL IMPRESSION(S) / ED DIAGNOSES   Final diagnoses:  COPD exacerbation (HCC)  Respiratory distress  Shortness of breath  Acute respiratory failure with hypercapnia (HCC)   Rx / DC Orders   ED Discharge Orders     None      Note:  This document was prepared using Dragon voice recognition software and may include unintentional dictation errors.   Beanca Kiester K, MD 02/13/24 [phone]

## 2024-02-13 NOTE — ED Provider Notes (Signed)
 Care of this patient assumed from prior physician at 0700 pending anticipated admission. Please see prior physician note for further details.  Briefly this is a 68 year old male with history of COPD who presents with shortness of breath.  Received DuoNebs, Solu-Medrol .  Labs with leukocytosis.  VBG demonstrates respiratory acidosis with pH of 7.29, pCO2 71, bicarb 34.  Patient ordered for fluids, empiric antibiotics.  Patient initially improved after nebulizer treatment and steroids, signed out to me pending anticipated admission.  I did go to evaluate the patient at bedside.  Reports that he does feel somewhat improved, but does have some ongoing tachypnea with labored respirations.  Diminished breath sounds with occasional wheezing noted. Case discussed with Dr. Laurita with hospitalist team.  He will evaluate patient at bedside for anticipated admission and consideration for initiation of BiPAP.     Levander Slate, MD 02/13/24 [phone]

## 2024-02-13 NOTE — ED Notes (Signed)
RT at bedside to place pt on BIPAP

## 2024-02-13 NOTE — ED Triage Notes (Signed)
 Pt arrived by ems d/t SOB x2 days. Hx COPD and is wheezing in all quadrants. Ems administered duoneb tx, albuterol , and 125 mg solumedrol. Per ems, pt was tripoding upon their arrival to his home

## 2024-02-13 NOTE — ED Notes (Signed)
 Pt off BiPap at this time to take medications. Pt is 100 via [place] on 4L. Pt is requesting to stay of BiPap for now.

## 2024-02-13 NOTE — ED Notes (Signed)
 Admit MD messaged regarding wife wanting update.

## 2024-02-13 NOTE — ED Notes (Signed)
Report given to Camilla RN.

## 2024-02-13 NOTE — Progress Notes (Addendum)
 Patient was seen coughing up streaks of blood, and two dark colored stool. Will hold off Eliquis .  Send stat chest xray and CBC.  Went to see the patient again. Patient is doing shallow fast breathing and on listening, there are still very diminished breathing sounds B/L. The sputum was blood clot mixed mucous, no fresh red material seen. Suspect the black stool coming from swallowed bloody sputum? Will see what CBC is.  Called RT for BIPAP.

## 2024-02-13 NOTE — ED Notes (Signed)
 Pt given urinal.

## 2024-02-13 NOTE — ED Notes (Signed)
 Pt given water

## 2024-02-14 DIAGNOSIS — I480 Paroxysmal atrial fibrillation: Secondary | ICD-10-CM | POA: Diagnosis present

## 2024-02-14 DIAGNOSIS — Z79899 Other long term (current) drug therapy: Secondary | ICD-10-CM | POA: Diagnosis not present

## 2024-02-14 DIAGNOSIS — Z8249 Family history of ischemic heart disease and other diseases of the circulatory system: Secondary | ICD-10-CM | POA: Diagnosis not present

## 2024-02-14 DIAGNOSIS — E785 Hyperlipidemia, unspecified: Secondary | ICD-10-CM | POA: Diagnosis present

## 2024-02-14 DIAGNOSIS — E66811 Obesity, class 1: Secondary | ICD-10-CM | POA: Diagnosis present

## 2024-02-14 DIAGNOSIS — J441 Chronic obstructive pulmonary disease with (acute) exacerbation: Secondary | ICD-10-CM | POA: Diagnosis present

## 2024-02-14 DIAGNOSIS — I2510 Atherosclerotic heart disease of native coronary artery without angina pectoris: Secondary | ICD-10-CM | POA: Diagnosis present

## 2024-02-14 DIAGNOSIS — I110 Hypertensive heart disease with heart failure: Secondary | ICD-10-CM | POA: Diagnosis present

## 2024-02-14 DIAGNOSIS — Z87891 Personal history of nicotine dependence: Secondary | ICD-10-CM | POA: Diagnosis not present

## 2024-02-14 DIAGNOSIS — Z923 Personal history of irradiation: Secondary | ICD-10-CM | POA: Diagnosis not present

## 2024-02-14 DIAGNOSIS — R0602 Shortness of breath: Secondary | ICD-10-CM | POA: Diagnosis not present

## 2024-02-14 DIAGNOSIS — Z8551 Personal history of malignant neoplasm of bladder: Secondary | ICD-10-CM | POA: Diagnosis not present

## 2024-02-14 DIAGNOSIS — J432 Centrilobular emphysema: Secondary | ICD-10-CM | POA: Diagnosis not present

## 2024-02-14 DIAGNOSIS — I10 Essential (primary) hypertension: Secondary | ICD-10-CM | POA: Diagnosis not present

## 2024-02-14 DIAGNOSIS — Z1152 Encounter for screening for COVID-19: Secondary | ICD-10-CM | POA: Diagnosis not present

## 2024-02-14 DIAGNOSIS — E8729 Other acidosis: Secondary | ICD-10-CM | POA: Diagnosis present

## 2024-02-14 DIAGNOSIS — N400 Enlarged prostate without lower urinary tract symptoms: Secondary | ICD-10-CM | POA: Diagnosis present

## 2024-02-14 DIAGNOSIS — J9602 Acute respiratory failure with hypercapnia: Secondary | ICD-10-CM | POA: Diagnosis not present

## 2024-02-14 DIAGNOSIS — D649 Anemia, unspecified: Secondary | ICD-10-CM | POA: Diagnosis present

## 2024-02-14 DIAGNOSIS — Z9981 Dependence on supplemental oxygen: Secondary | ICD-10-CM | POA: Diagnosis not present

## 2024-02-14 DIAGNOSIS — J9621 Acute and chronic respiratory failure with hypoxia: Secondary | ICD-10-CM | POA: Diagnosis present

## 2024-02-14 DIAGNOSIS — Z7901 Long term (current) use of anticoagulants: Secondary | ICD-10-CM | POA: Diagnosis not present

## 2024-02-14 DIAGNOSIS — R042 Hemoptysis: Secondary | ICD-10-CM | POA: Diagnosis present

## 2024-02-14 DIAGNOSIS — E039 Hypothyroidism, unspecified: Secondary | ICD-10-CM | POA: Diagnosis present

## 2024-02-14 DIAGNOSIS — I420 Dilated cardiomyopathy: Secondary | ICD-10-CM | POA: Diagnosis present

## 2024-02-14 DIAGNOSIS — R0603 Acute respiratory distress: Secondary | ICD-10-CM | POA: Diagnosis not present

## 2024-02-14 DIAGNOSIS — J9612 Chronic respiratory failure with hypercapnia: Secondary | ICD-10-CM | POA: Diagnosis present

## 2024-02-14 DIAGNOSIS — I5022 Chronic systolic (congestive) heart failure: Secondary | ICD-10-CM | POA: Diagnosis present

## 2024-02-14 DIAGNOSIS — Z79890 Hormone replacement therapy (postmenopausal): Secondary | ICD-10-CM | POA: Diagnosis not present

## 2024-02-14 LAB — BLOOD GAS, VENOUS
Bicarbonate: 34.1 mmol/L — ABNORMAL HIGH (ref 20.0–28.0)
O2 Saturation: 24.1 % — AB (ref 0.0–2.0)
Patient temperature: 37
Patient temperature: 37 %
pCO2, Ven: 71 mmHg (ref 44–60)
pH, Ven: 7.29 (ref 7.25–7.43)
pO2, Ven: <34.1 mmHg — CL (ref 32–45)

## 2024-02-14 LAB — BASIC METABOLIC PANEL WITH GFR
Anion gap: 7 (ref 5–15)
BUN: 36 mg/dL — ABNORMAL HIGH (ref 8–23)
CO2: 28 mmol/L (ref 22–32)
Calcium: 8.2 mg/dL — ABNORMAL LOW (ref 8.9–10.3)
Chloride: 105 mmol/L (ref 98–111)
Creatinine, Ser: 0.67 mg/dL (ref 0.61–1.24)
GFR, Estimated: >60 mL/min (ref >60)
Glucose, Bld: 147 mg/dL — ABNORMAL HIGH (ref 70–99)
Potassium: 4.6 mmol/L (ref 3.5–5.1)
Sodium: 140 mmol/L (ref 135–145)

## 2024-02-14 LAB — CBC
HCT: 29.3 % — ABNORMAL LOW (ref 39.0–52.0)
Hemoglobin: 9.3 g/dL — ABNORMAL LOW (ref 13.0–17.0)
MCH: 28.9 pg (ref 26.0–34.0)
MCHC: 31.7 g/dL (ref 30.0–36.0)
MCV: 91 fL (ref 80.0–100.0)
Platelets: 178 K/uL (ref 150–400)
RBC: 3.22 MIL/uL — ABNORMAL LOW (ref 4.22–5.81)
RDW: 14.1 % (ref 11.5–15.5)
WBC: 10.9 K/uL — ABNORMAL HIGH (ref 4.0–10.5)
nRBC: 0 % (ref 0.0–0.2)

## 2024-02-14 LAB — TROPONIN I (HIGH SENSITIVITY): Troponin I (High Sensitivity): 8 ng/L (ref <18)

## 2024-02-14 MED ORDER — CALCIUM CARBONATE ANTACID 500 MG PO CHEW
1.0000 | CHEWABLE_TABLET | Freq: Three times a day (TID) | ORAL | Status: DC | PRN
  Administered 2024-02-14: 200 mg via ORAL
  Filled 2024-02-14: qty 1

## 2024-02-14 MED ORDER — ALUM & MAG HYDROXIDE-SIMETH 200-200-20 MG/5ML PO SUSP
30.0000 mL | Freq: Four times a day (QID) | ORAL | Status: DC | PRN
  Administered 2024-02-14 (×2): 30 mL via ORAL
  Filled 2024-02-14 (×2): qty 30

## 2024-02-14 MED ORDER — METOPROLOL SUCCINATE ER 25 MG PO TB24
25.0000 mg | ORAL_TABLET | Freq: Every day | ORAL | Status: DC
  Administered 2024-02-15: 25 mg via ORAL
  Filled 2024-02-14: qty 1

## 2024-02-14 NOTE — Hospital Course (Addendum)
 Hospital course / significant events:   Benjamin Torres is a 68 y.o. 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   HPI: Symptoms started approx 10/15, w/ dry cough wheezing and increasing exertional dyspnea, denies chest pain no fever or chills. He has been taking oral colchicine no labs with minimal improvement and could not sleep last night 10/16 and decided to come to ED.   10/17: to ED. Initially 100% on RA but increased WOB, requiring BiPAP. Had multiple rounds of DuoNebs, got 1 L IV fluid, ceftriaxone  and azithromycin  in the ED. Admitted to hospitalist service. Held apixiban d/t coughing bloody sputum  10/18: pt feeling better on BiPAP and reluctant to wean down at this time, will continue for now and reassess      Consultants:  none  Procedures/Surgeries: none      ASSESSMENT & PLAN:   Acute COPD exacerbation Acute hypoxic respiratory failure Compensated chronic respiratory acidosis BiPAP, titrate according to symptoms. IV Solu-Medrol  twice daily, plan transition to prednisone  Continue Breztri  Continue DuoNebs every 6 hours and as needed albuterol  Incentive spirometry Continue Singulair     SIRS He has tachycardia he has leukocytosis, x-ray however showed no acute infiltrates.  Might have atypical infection.   Switch antibiotic coverage to doxycycline.   Check respiratory panel --> negative    Bloody sputum Dark stool  Normocytic anemia Suspect dilution effect of IV fluids on Hgb but w/ dark stool in pt on anticoagulant, unclear if actual melena. Some blood tinged sputum is not worrisome in patient on anticoagulation  will hold Eliquis  for now and monitor  Chronic HFrEF PAF currently in sinus rhythm Euvolemic Blood  pressure borderline low --> reduce metoprolol   Holding eliquis  d/t coughing blood tinged sputum and question dark stool Telemetry  HLD Statin, zetia   Hypothyroidism Continue Synthroid    BPH No urinary symptoms.  Continue Flomax     Overweight / Class 1 obesity based on BMI: Body mass index is 29.88 kg/m.[name] Significantly low or high BMI is associated with higher medical risk.  Underweight - under 18  overweight - 25 to 29 obese - 30 or more Class 1 obesity: BMI of 30.0 to 34 Class 2 obesity: BMI of 35.0 to 39 Class 3 obesity: BMI of 40.0 to 49 Super Morbid Obesity: BMI 50-59 Super-super Morbid Obesity: BMI 60+ Healthy nutrition and physical activity advised as adjunct to other disease management and risk reduction treatments    DVT prophylaxis: holding d/t bloody sputum and anemia (though suspect Hgb drop from dilution effect of IV fluids, cannot r/o GIB, monitoring) IV fluids: no continuous IV fluids  Nutrition: cardiac diet  Central lines / other devices: none  Code Status: FULL CODE ACP documentation reviewed:  none on file in [facility]  TOC needs: TBD Medical barriers to dispo: BiPAP support. Expected medical readiness for discharge pending clinical course / wean O2.

## 2024-02-14 NOTE — Progress Notes (Signed)
 PROGRESS NOTE    [name]   FMW:[id number] DOB: 05-Nov-1955  DOA: 02/13/2024 Date of Service: 02/14/24 which is hospital day 0  PCP: Cleotilde Oneil FALCON, MD    Hospital course / significant events:   Benjamin Torres is a 68 y.o. 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   HPI: Symptoms started approx 10/15, w/ dry cough wheezing and increasing exertional dyspnea, denies chest pain no fever or chills. He has been taking oral colchicine no labs with minimal improvement and could not sleep last night 10/16 and decided to come to ED.   10/17: to ED. Initially 100% on RA but increased WOB, requiring BiPAP. Had multiple rounds of DuoNebs, got 1 L IV fluid, ceftriaxone  and azithromycin  in the ED. Admitted to hospitalist service. Held apixiban d/t coughing bloody sputum  10/18: pt feeling better on BiPAP and reluctant to wean down at this time, will continue for now and reassess      Consultants:  none  Procedures/Surgeries: none      ASSESSMENT & PLAN:   Acute COPD exacerbation Acute hypoxic respiratory failure Compensated chronic respiratory acidosis BiPAP, titrate according to symptoms. IV Solu-Medrol  twice daily, plan transition to prednisone  Continue Breztri  Continue DuoNebs every 6 hours and as needed albuterol  Incentive spirometry Continue Singulair     SIRS He has tachycardia he has leukocytosis, x-ray however showed no acute infiltrates.  Might have atypical infection.   Switch antibiotic coverage to doxycycline.   Check respiratory panel --> negative    Bloody sputum Dark stool  Normocytic anemia Suspect dilution effect of IV fluids on Hgb but w/ dark stool in pt on anticoagulant, unclear if actual melena. Some  blood tinged sputum is not worrisome in patient on anticoagulation  will hold Eliquis  for now and monitor  Chronic HFrEF PAF currently in sinus rhythm Euvolemic Blood pressure borderline low --> reduce metoprolol   Holding eliquis  d/t coughing blood tinged sputum and question dark stool Telemetry  HLD Statin, zetia   Hypothyroidism Continue Synthroid    BPH No urinary symptoms.  Continue Flomax     Overweight / Class 1 obesity based on BMI: Body mass index is 29.88 kg/m.Benjamin Significantly low or high BMI is associated with higher medical risk.  Underweight - under 18  overweight - 25 to 29 obese - 30 or more Class 1 obesity: BMI of 30.0 to 34 Class 2 obesity: BMI of 35.0 to 39 Class 3 obesity: BMI of 40.0 to 49 Super Morbid Obesity: BMI 50-59 Super-super Morbid Obesity: BMI 60+ Healthy nutrition and physical activity advised as adjunct to other disease management and risk reduction treatments    DVT prophylaxis: holding d/t bloody sputum and anemia (though suspect Hgb drop from dilution effect of IV fluids, cannot r/o GIB, monitoring) IV fluids: no continuous IV fluids  Nutrition: cardiac diet  Central lines / other devices: none  Code Status: FULL CODE ACP documentation reviewed:  none on file in [facility]  TOC needs: TBD Medical barriers to dispo: BiPAP support. Expected medical readiness for discharge pending clinical course / wean O2.              Subjective / Brief ROS:  Patient reports feeling about same compared to yesterday but breathing is better on the BiPAP Denies CP Pain controlled.  Denies  new weakness.    Family Communication: none at this time     Objective Findings:  Vitals:   02/14/24 0749 02/14/24 0759 02/14/24 1200 02/14/24 1322  BP:  104/74 118/80   Pulse:  73 68   Resp: 17 17 18    Temp:  98 F (36.7 C) 98 F (36.7 C)   TempSrc:  Axillary Axillary   SpO2: 100% 99% 100% 100%  Weight:      Height:        Intake/Output Summary  (Last 24 hours) at 02/14/2024 1522 Last data filed at 02/14/2024 1020 Gross per 24 hour  Intake 120 ml  Output 200 ml  Net -80 ml   Filed Weights   02/13/24 0611  Weight: 84 kg    Examination:  Physical Exam Constitutional:      General: He is not in acute distress. Cardiovascular:     Rate and Rhythm: Normal rate and regular rhythm.  Pulmonary:     Effort: No respiratory distress.     Breath sounds: Decreased breath sounds present.  Musculoskeletal:     Right lower leg: No edema.     Left lower leg: No edema.  Neurological:     Mental Status: He is alert.  Psychiatric:        Mood and Affect: Mood normal.        Behavior: Behavior normal.          Scheduled Medications:   ALPRAZolam   0.5 mg Oral BID   budesonide-glycopyrrolate -formoterol  2 puff Inhalation BID   doxycycline  100 mg Oral Q12H   ezetimibe   10 mg Oral Daily   gabapentin   100 mg Oral Daily   ipratropium-albuterol   3 mL Nebulization Q6H   levothyroxine   100 mcg Oral QAC breakfast   [START ON 02/15/2024] metoprolol  succinate  25 mg Oral Daily   montelukast   10 mg Oral Daily   mouth rinse  15 mL Mouth Rinse 4 times per day   predniSONE   40 mg Oral Q breakfast   rosuvastatin   10 mg Oral QHS   tamsulosin   0.4 mg Oral Daily    Continuous Infusions:   PRN Medications:  acetaminophen , albuterol , alum & mag hydroxide-simeth, ondansetron  **OR** ondansetron  (ZOFRAN ) IV, mouth rinse, senna-docusate  Antimicrobials from admission:  Anti-infectives (From admission, onward)    Start     Dose/Rate Route Frequency Ordered Stop   02/13/24 1000  doxycycline (VIBRA-TABS) tablet 100 mg        100 mg Oral Every 12 hours 02/13/24 0751 02/18/24 0959   02/13/24 0645  cefTRIAXone  (ROCEPHIN ) 1 g in sodium chloride  0.9 % 100 mL IVPB        1 g 200 mL/hr over 30 Minutes Intravenous  Once 02/13/24 0639 02/13/24 0724   02/13/24 0645  azithromycin  (ZITHROMAX ) 500 mg in sodium chloride  0.9 % 250 mL IVPB        500  mg 250 mL/hr over 60 Minutes Intravenous  Once 02/13/24 9360 02/13/24 9148           Data Reviewed:  I have personally reviewed the following...  CBC: Recent Labs  Lab 02/13/24 0614 02/13/24 1613 02/13/24 2218 02/14/24 0558  WBC 18.4* 16.0*  --  10.9*  NEUTROABS 10.3*  --   --   --   HGB 11.4* 10.7* 10.5* 9.3*  HCT 37.3* 34.6* 33.2* 29.3*  MCV 93.5 92.0  --  91.0  PLT 217 197  --  178   Basic Metabolic Panel: Recent  Labs  Lab 02/13/24 0614 02/14/24 0558  NA 142 140  K 4.7 4.6  CL 104 105  CO2 31 28  GLUCOSE 166* 147*  BUN 24* 36*  CREATININE 0.84 0.67  CALCIUM  8.1* 8.2*   GFR: Estimated Creatinine Clearance: 88.6 mL/min (by C-G formula based on SCr of 0.67 mg/dL). Liver Function Tests: Recent Labs  Lab 02/13/24 0614  AST 15  ALT 16  ALKPHOS 29*  BILITOT 0.2  PROT 5.5*  ALBUMIN 3.0*   No results for input(s): LIPASE, AMYLASE in the last 168 hours. No results for input(s): AMMONIA in the last 168 hours. Coagulation Profile: No results for input(s): INR, PROTIME in the last 168 hours. Cardiac Enzymes: No results for input(s): CKTOTAL, CKMB, CKMBINDEX, TROPONINI in the last 168 hours. BNP (last 3 results) No results for input(s): PROBNP in the last 8760 hours. HbA1C: No results for input(s): HGBA1C in the last 72 hours. CBG: No results for input(s): GLUCAP in the last 168 hours. Lipid Profile: No results for input(s): CHOL, HDL, LDLCALC, TRIG, CHOLHDL, LDLDIRECT in the last 72 hours. Thyroid  Function Tests: No results for input(s): TSH, T4TOTAL, FREET4, T3FREE, THYROIDAB in the last 72 hours. Anemia Panel: No results for input(s): VITAMINB12, FOLATE, FERRITIN, TIBC, IRON, RETICCTPCT in the last 72 hours. Most Recent Urinalysis On File:     Component Value Date/Time   COLORURINE YELLOW (A) 02/13/2024 0556   APPEARANCEUR CLEAR (A) 02/13/2024 0556   APPEARANCEUR Clear 03/06/2017 1414    LABSPEC 1.017 02/13/2024 0556   PHURINE 6.0 02/13/2024 0556   GLUCOSEU NEGATIVE 02/13/2024 0556   HGBUR NEGATIVE 02/13/2024 0556   BILIRUBINUR NEGATIVE 02/13/2024 0556   BILIRUBINUR Negative 03/06/2017 1414   KETONESUR NEGATIVE 02/13/2024 0556   PROTEINUR NEGATIVE 02/13/2024 0556   NITRITE NEGATIVE 02/13/2024 0556   LEUKOCYTESUR NEGATIVE 02/13/2024 0556   Sepsis Labs: @LABRCNTIP (procalcitonin:4,lacticidven:4) Microbiology: Recent Results (from the past 240 hours)  Resp panel by RT-PCR (RSV, Flu A&B, Covid) Anterior Nasal Swab     Status: None   Collection Time: 02/13/24  6:14 AM   Specimen: Anterior Nasal Swab  Result Value Ref Range Status   SARS Coronavirus 2 by RT PCR NEGATIVE NEGATIVE Final    Comment: (NOTE) SARS-CoV-2 target nucleic acids are NOT DETECTED.  The SARS-CoV-2 RNA is generally detectable in upper respiratory specimens during the acute phase of infection. The lowest concentration of SARS-CoV-2 viral copies this assay can detect is 138 copies/mL. A negative result does not preclude SARS-Cov-2 infection and should not be used as the sole basis for treatment or other patient management decisions. A negative result may occur with  improper specimen collection/handling, submission of specimen other than nasopharyngeal swab, presence of viral mutation(s) within the areas targeted by this assay, and inadequate number of viral copies(<138 copies/mL). A negative result must be combined with clinical observations, patient history, and epidemiological information. The expected result is Negative.  Fact Sheet for Patients:  BloggerCourse.com  Fact Sheet for Healthcare Providers:  SeriousBroker.it  This test is no t yet approved or cleared by the [place]  FDA and  has been authorized for detection and/or diagnosis of SARS-CoV-2 by FDA under an Emergency Use Authorization (EUA). This EUA will remain  in effect  (meaning this test can be used) for the duration of the COVID-19 declaration under Section 564(b)(1) of the Act, 21 U.S.C.section 360bbb-3(b)(1), unless the authorization is terminated  or revoked sooner.       Influenza A by PCR NEGATIVE NEGATIVE Final  Influenza B by PCR NEGATIVE NEGATIVE Final    Comment: (NOTE) The Xpert Xpress SARS-CoV-2/FLU/RSV plus assay is intended as an aid in the diagnosis of influenza from Nasopharyngeal swab specimens and should not be used as a sole basis for treatment. Nasal washings and aspirates are unacceptable for Xpert Xpress SARS-CoV-2/FLU/RSV testing.  Fact Sheet for Patients: BloggerCourse.com  Fact Sheet for Healthcare Providers: SeriousBroker.it  This test is not yet approved or cleared by the [place]  FDA and has been authorized for detection and/or diagnosis of SARS-CoV-2 by FDA under an Emergency Use Authorization (EUA). This EUA will remain in effect (meaning this test can be used) for the duration of the COVID-19 declaration under Section 564(b)(1) of the Act, 21 U.S.C. section 360bbb-3(b)(1), unless the authorization is terminated or revoked.     Resp Syncytial Virus by PCR NEGATIVE NEGATIVE Final    Comment: (NOTE) Fact Sheet for Patients: BloggerCourse.com  Fact Sheet for Healthcare Providers: SeriousBroker.it  This test is not yet approved or cleared by the [place]  FDA and has been authorized for detection and/or diagnosis of SARS-CoV-2 by FDA under an Emergency Use Authorization (EUA). This EUA will remain in effect (meaning this test can be used) for the duration of the COVID-19 declaration under Section 564(b)(1) of the Act, 21 U.S.C. section 360bbb-3(b)(1), unless the authorization is terminated or revoked.  Performed at [facility], [address] [place], [place] [address]   Culture, blood  (routine x 2)     Status: None (Preliminary result)   Collection Time: 02/13/24  6:14 AM   Specimen: Right Antecubital; Blood  Result Value Ref Range Status   Specimen Description RIGHT ANTECUBITAL  Final   Special Requests   Final    BOTTLES DRAWN AEROBIC AND ANAEROBIC Blood Culture results may not be optimal due to an inadequate volume of blood received in culture bottles   Culture   Final    NO GROWTH < 24 HOURS Performed at [facility], [address], [place], [place] [address]    Report Status PENDING  Incomplete  Culture, blood (routine x 2)     Status: None (Preliminary result)   Collection Time: 02/13/24  6:14 AM   Specimen: Left Antecubital; Blood  Result Value Ref Range Status   Specimen Description LEFT ANTECUBITAL  Final   Special Requests   Final    BOTTLES DRAWN AEROBIC AND ANAEROBIC Blood Culture results may not be optimal due to an inadequate volume of blood received in culture bottles   Culture   Final    NO GROWTH < 24 HOURS Performed at [facility], [address], [place], [place] [address]    Report Status PENDING  Incomplete  Expectorated Sputum Assessment w Gram Stain, Rflx to Resp Cult     Status: None   Collection Time: 02/13/24  8:20 AM   Specimen: Sputum  Result Value Ref Range Status   Specimen Description SPUTUM  Final   Special Requests NONE  Final   Sputum evaluation   Final    Sputum specimen not acceptable for testing.  Please recollect.   RESULT CALLED TO, READ BACK BY AND VERIFIED WITH: LUCIE LEMMINGS, RN AT [phone] 02/13/24 RAM Performed at [facility], [address], [place], [place] [address]    Report Status 02/13/2024 FINAL  Final  Respiratory (~20 pathogens) panel by PCR     Status: None   Collection Time: 02/13/24  8:20 AM   Specimen: Expectorated Sputum; Respiratory  Result Value Ref Range Status   Adenovirus NOT DETECTED NOT DETECTED Final   Coronavirus 229E NOT DETECTED NOT DETECTED Final     Comment: (NOTE) The Coronavirus on the Respiratory Panel, DOES NOT test for the novel  Coronavirus (2019 nCoV)    Coronavirus HKU1 NOT DETECTED NOT DETECTED Final   Coronavirus NL63 NOT DETECTED NOT DETECTED Final   Coronavirus OC43 NOT DETECTED NOT DETECTED Final   Metapneumovirus NOT DETECTED NOT DETECTED Final   Rhinovirus / Enterovirus NOT DETECTED NOT DETECTED Final   Influenza A NOT DETECTED NOT DETECTED Final   Influenza B NOT DETECTED NOT DETECTED Final   Parainfluenza Virus 1 NOT DETECTED NOT DETECTED Final   Parainfluenza Virus 2 NOT DETECTED NOT DETECTED Final   Parainfluenza Virus 3 NOT DETECTED NOT DETECTED Final   Parainfluenza Virus 4 NOT DETECTED NOT DETECTED Final   Respiratory Syncytial Virus NOT DETECTED NOT DETECTED Final   Bordetella pertussis NOT DETECTED NOT DETECTED Final   Bordetella Parapertussis NOT DETECTED NOT DETECTED Final   Chlamydophila pneumoniae NOT DETECTED NOT DETECTED Final   Mycoplasma pneumoniae NOT DETECTED NOT DETECTED Final    Comment: Performed at [facility], [address], [place], [place] [address]      Radiology Studies last 3 days: DG Chest 1 View Result Date: 02/13/2024 CLINICAL DATA:  Congestive heart failure EXAM: CHEST  1 VIEW COMPARISON:  02/13/2024 at 6:24 a.m. FINDINGS: Apical lordotic projection. Emphysema. Pulmonary nodule shown on the CT chest from 09/02/2023 are not well visualized on conventional radiography, and would probably be better followed with chest CT at the appropriate interval. Heart size within normal limits. No current edema observed. No blunting of the costophrenic angles. IMPRESSION: 1. No current edema or pleural effusion. 2. Emphysema. 3. Pulmonary nodules shown on the CT chest from 09/02/2023 are not well visualized on conventional radiography, and would probably be better followed with chest CT at the appropriate interval. Electronically Signed   By: Ryan Salvage M.D.   On: 02/13/2024 16:45   DG  Chest Port 1 View Result Date: 02/13/2024 EXAM: 1 VIEW(S) XRAY OF THE CHEST 02/13/2024 06:28:00 AM COMPARISON: Chest CT without contrast 01/22/2024. CLINICAL HISTORY: SOB, Hx of COPD. FINDINGS: LUNGS AND PLEURA: The lungs are emphysematous but radiographically clear. Scattered pulmonary nodules described in the chest CT report are not visible on this radiograph. Follow up as indicated. No pulmonary edema. No pleural effusion. No pneumothorax. HEART AND MEDIASTINUM: No acute abnormality of the cardiac and mediastinal silhouettes. BONES AND SOFT TISSUES: No acute osseous abnormality. IMPRESSION: 1. Emphysematous lungs, radiographically clear. 2. Pulmonary nodules seen on prior CT are not visualized on this radiograph; recommend CT-based follow-up as clinically warranted. Electronically signed by: Francis Quam MD 02/13/2024 06:36 AM EDT RP Workstation: [id number]         Laneta Blunt, DO [facility] 02/14/2024, 3:22 PM    Dictation software may have been used to generate the above note. Typos may occur and escape review in typed/dictated notes. Please contact Dr Blunt directly for clarity if needed.  Staff may message me via secure chat in Epic  but this may not receive an immediate response,  please page me for urgent matters!  If 7PM-7AM, please contact night coverage [email or web address]

## 2024-02-14 NOTE — Care Management Obs Status (Signed)
 MEDICARE OBSERVATION STATUS NOTIFICATION   Patient Details  Name: Benjamin Torres MRN: [id number] Date of Birth: 1956-02-17   Medicare Observation Status Notification Given:  Yes    Rojelio SHAUNNA Rattler 02/14/2024, 12:51 PM

## 2024-02-15 ENCOUNTER — Other Ambulatory Visit: Payer: Self-pay

## 2024-02-15 DIAGNOSIS — J432 Centrilobular emphysema: Secondary | ICD-10-CM | POA: Diagnosis not present

## 2024-02-15 LAB — CBC
HCT: 29.8 % — ABNORMAL LOW (ref 39.0–52.0)
Hemoglobin: 9.3 g/dL — ABNORMAL LOW (ref 13.0–17.0)
MCH: 28.6 pg (ref 26.0–34.0)
MCHC: 31.2 g/dL (ref 30.0–36.0)
MCV: 91.7 fL (ref 80.0–100.0)
Platelets: 189 K/uL (ref 150–400)
RBC: 3.25 MIL/uL — ABNORMAL LOW (ref 4.22–5.81)
RDW: 14.3 % (ref 11.5–15.5)
WBC: 15 K/uL — ABNORMAL HIGH (ref 4.0–10.5)
nRBC: 0 % (ref 0.0–0.2)

## 2024-02-15 LAB — TROPONIN I (HIGH SENSITIVITY): Troponin I (High Sensitivity): 8 ng/L (ref <18)

## 2024-02-15 MED ORDER — IPRATROPIUM-ALBUTEROL 0.5-2.5 (3) MG/3ML IN SOLN
3.0000 mL | Freq: Three times a day (TID) | RESPIRATORY_TRACT | Status: DC
  Administered 2024-02-15 (×2): 3 mL via RESPIRATORY_TRACT
  Filled 2024-02-15 (×2): qty 3

## 2024-02-15 MED ORDER — DOXYCYCLINE HYCLATE 100 MG PO TABS
100.0000 mg | ORAL_TABLET | Freq: Two times a day (BID) | ORAL | 0 refills | Status: AC
  Filled 2024-02-15: qty 7, 4d supply, fill #0

## 2024-02-15 MED ORDER — PREDNISONE 20 MG PO TABS
40.0000 mg | ORAL_TABLET | Freq: Every day | ORAL | 0 refills | Status: AC
  Filled 2024-02-15: qty 6, 3d supply, fill #0

## 2024-02-15 MED ORDER — METOPROLOL SUCCINATE ER 50 MG PO TB24: 25.0000 mg | ORAL_TABLET | Freq: Every day | ORAL | Status: AC

## 2024-02-15 NOTE — Progress Notes (Signed)
 O2 sat at rest on room air: 100%  If this is 88% or below, stop, no further testing is needed.  Test with exertion:  O2 sat at exertion on room air: 80%, then  O2 sat at exertion on 5 L/min of O2: 90%.   O2 sat reading without good pleth wave while walking. When stopped after taking a couple steps patient's O2 sats rise to 99% on 2L [place].  Patient uses 2LNC at home.

## 2024-02-15 NOTE — Discharge Summary (Signed)
 Physician Discharge Summary   Patient: Benjamin Torres MRN: [id number]  DOB: Nov 25, 1955   Admit:     Date of Admission: 02/13/2024 Admitted from: home   Discharge: Date of discharge: 02/15/24 Disposition: Home Condition at discharge: good  CODE STATUS: FULL CODE     Discharge Physician: Benjamin Blunt, DO [facility]     PCP: Benjamin Oneil FALCON, MD  Recommendations for Outpatient Follow-up:  Follow up with PCP Benjamin Oneil FALCON, MD in 1-2 weeks or w/ pulmonology   Discharge Instructions     Diet general   Complete by: As directed    Discharge instructions   Complete by: As directed    Finish antibiotics (doxycycline) and steroids (prednisone ) Inhalers / nebulizers as directed! Restart the Breztri !  Use oxygen 24/7 for at least the next week.  Follow with primary care or pulmonology asap for hospital follow up visit for recheck   Increase activity slowly   Complete by: As directed          Discharge Diagnoses: Principal Problem:   COPD (chronic obstructive pulmonary disease) (HCC) Active Problems:   COPD exacerbation [facility])      Hospital course / significant events:   Benjamin Torres is a 68 y.o. 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   HPI: Symptoms started approx 10/15, w/ dry cough wheezing and increasing exertional dyspnea, denies chest pain no fever or chills. He has been taking oral colchicine no labs with minimal improvement and could not sleep last night 10/16 and decided to come to ED.   10/17: to ED. Initially 100% on RA but increased WOB, requiring BiPAP. Had multiple rounds of DuoNebs, got 1 L IV fluid, ceftriaxone  and azithromycin  in the ED. Admitted to hospitalist service. Held apixiban  d/t coughing bloody sputum  10/18: pt feeling better on BiPAP and reluctant to wean down at this time, will continue for now and reassess      Consultants:  none  Procedures/Surgeries: none      ASSESSMENT & PLAN:   Acute COPD exacerbation Acute hypoxic respiratory failure Compensated chronic respiratory acidosis Finish burst prednisone  Start Breztri  - had not been taking this at home Continue as needed albuterol  Incentive spirometry Continue Singulair     SIRS He has tachycardia he has leukocytosis, x-ray however showed no acute infiltrates.  Might have atypical infection.   Monitor    Bloody sputum Dark stool  Normocytic anemia Suspect dilution effect of IV fluids on Hgb but w/ dark stool in pt on anticoagulant, unclear if actual melena. Some blood tinged sputum is not worrisome in patient on anticoagulation  Restart eliquis  on discharge   Chronic HFrEF PAF currently in sinus rhythm Euvolemic Blood pressure borderline low --> reduce metoprolol    HLD Statin, zetia   Hypothyroidism Continue Synthroid    BPH No urinary symptoms.  Continue Flomax     Overweight / Class 1 obesity based on BMI: Body mass index is 29.88 kg/m.[name] Significantly low or high BMI is associated with higher medical risk.  Underweight - under 18  overweight - 25 to 29 obese - 30 or more Class 1 obesity: BMI of 30.0 to 34 Class 2 obesity: BMI of 35.0 to 39 Class 3 obesity: BMI of 40.0 to 49 Super Morbid Obesity: BMI 50-59 Super-super  Morbid Obesity: BMI 60+ Healthy nutrition and physical activity advised as adjunct to other disease management and risk reduction treatments           Discharge Instructions  Allergies as of 02/15/2024   No Known Allergies      Medication List     STOP taking these medications    nystatin  100000 UNIT/ML suspension Commonly known as: MYCOSTATIN        TAKE these medications    acetaminophen  325 MG tablet Commonly known as:  TYLENOL  Take 2 tablets (650 mg total) by mouth every 6 (six) hours as needed for mild pain (pain score 1-3).   albuterol  108 (90 Base) MCG/ACT inhaler Commonly known as: VENTOLIN  HFA Inhale 1 puff into the lungs every 4 (four) hours as needed for wheezing or shortness of breath.   albuterol  (2.5 MG/3ML) 0.083% nebulizer solution Commonly known as: PROVENTIL  Take 3 mLs (2.5 mg total) by nebulization every 6 (six) hours as needed for wheezing or shortness of breath.   ALPRAZolam  0.5 MG tablet Commonly known as: XANAX  Take 0.5 mg by mouth 2 (two) times daily.   apixaban  5 MG Tabs tablet Commonly known as: ELIQUIS  Take 1 tablet (5 mg total) by mouth 2 (two) times daily.   Breztri  Aerosphere 160-9-4.8 MCG/ACT Aero inhaler Generic drug: budesonide-glycopyrrolate -formoterol Inhale 2 puffs into the lungs in the morning and at bedtime.   cyanocobalamin  1000 MCG/ML injection Commonly known as: VITAMIN B12 Inject 1,000 mcg into the muscle every 30 (thirty) days.   doxycycline 100 MG tablet Commonly known as: VIBRA-TABS Take 1 tablet (100 mg total) by mouth every 12 (twelve) hours for 7 doses. First dose evening 02/15/24 then twice daily starting 02/16/24   ezetimibe  10 MG tablet Commonly known as: ZETIA  Take 1 tablet (10 mg total) by mouth daily.   fluconazole 200 MG tablet Commonly known as: DIFLUCAN Take 200 mg by mouth daily.   gabapentin  100 MG capsule Commonly known as: NEURONTIN  Take 100 mg by mouth daily.   levothyroxine  100 MCG tablet Commonly known as: SYNTHROID  Take 100 mcg by mouth daily before breakfast.   metoprolol  succinate 50 MG 24 hr tablet Commonly known as: TOPROL -XL Take 0.5 tablets (25 mg total) by mouth daily. What changed: how much to take   montelukast  10 MG tablet Commonly known as: SINGULAIR  Take 10 mg by mouth daily.   nicotine  21 mg/24hr patch Commonly known as: NICODERM CQ  - dosed in mg/24 hours Place 1 patch (21 mg total) onto the skin  daily.   OXYGEN Inhale into the lungs. At night and with exertion   predniSONE  20 MG tablet Commonly known as: DELTASONE  Take 2 tablets (40 mg total) by mouth daily with breakfast for 3 days. Start taking on: February 16, 2024   rosuvastatin  10 MG tablet Commonly known as: CRESTOR  Take 10 mg by mouth at bedtime.   tamsulosin  0.4 MG Caps capsule Commonly known as: FLOMAX  Take 1 capsule (0.4 mg total) by mouth daily.         Follow-up Information     Benjamin Oneil FALCON, MD. Schedule an appointment as soon as possible for a visit.   Specialty: Internal Medicine Why: hospital follow up asap if not able to get into pulmonary in the next 1-2 weeks Contact information: [address] [facility] [place] [address] [phone]         Theotis Lavelle BRAVO, MD. Schedule an appointment as soon as possible for a visit.   Specialty:  Specialist Why: hospital follow up ASAP Contact information: [address] [place] [address] [phone]                 No Known Allergies   Subjective: pt feeling better this morning, off BIPAP and would like to go home. NO SOB at rest, mild SOB on exertion improved w/ O2. Usually only uses O2 on exertion agrees to use 24/7 for now    Discharge Exam: BP 114/87 (BP Location: Right Arm)   Pulse 74   Temp 97.9 F (36.6 C) (Oral)   Resp 19   Ht 5' 6 (1.676 m)   Wt 84 kg   SpO2 100%   BMI 29.88 kg/m   General: Pt is alert, awake, not in acute distress Cardiovascular: RRR, S1/S2 +, no rubs, no gallops Respiratory: dimished breath sounds bilaterally, scattered mild wheeze, normal resp effort  Abdominal: Soft, NT, ND, bowel sounds + Extremities: no edema, no cyanosis     The results of significant diagnostics from this hospitalization (including imaging, microbiology, ancillary and laboratory) are listed below for reference.     Microbiology: Recent Results (from the past 240 hours)   Resp panel by RT-PCR (RSV, Flu A&B, Covid) Anterior Nasal Swab     Status: None   Collection Time: 02/13/24  6:14 AM   Specimen: Anterior Nasal Swab  Result Value Ref Range Status   SARS Coronavirus 2 by RT PCR NEGATIVE NEGATIVE Final    Comment: (NOTE) SARS-CoV-2 target nucleic acids are NOT DETECTED.  The SARS-CoV-2 RNA is generally detectable in upper respiratory specimens during the acute phase of infection. The lowest concentration of SARS-CoV-2 viral copies this assay can detect is 138 copies/mL. A negative result does not preclude SARS-Cov-2 infection and should not be used as the sole basis for treatment or other patient management decisions. A negative result may occur with  improper specimen collection/handling, submission of specimen other than nasopharyngeal swab, presence of viral mutation(s) within the areas targeted by this assay, and inadequate number of viral copies(<138 copies/mL). A negative result must be combined with clinical observations, patient history, and epidemiological information. The expected result is Negative.  Fact Sheet for Patients:  BloggerCourse.com  Fact Sheet for Healthcare Providers:  SeriousBroker.it  This test is no t yet approved or cleared by the [place]  FDA and  has been authorized for detection and/or diagnosis of SARS-CoV-2 by FDA under an Emergency Use Authorization (EUA). This EUA will remain  in effect (meaning this test can be used) for the duration of the COVID-19 declaration under Section 564(b)(1) of the Act, 21 U.S.C.section 360bbb-3(b)(1), unless the authorization is terminated  or revoked sooner.       Influenza A by PCR NEGATIVE NEGATIVE Final   Influenza B by PCR NEGATIVE NEGATIVE Final    Comment: (NOTE) The Xpert Xpress SARS-CoV-2/FLU/RSV plus assay is intended as an aid in the diagnosis of influenza from Nasopharyngeal swab specimens and should not be used  as a sole basis for treatment. Nasal washings and aspirates are unacceptable for Xpert Xpress SARS-CoV-2/FLU/RSV testing.  Fact Sheet for Patients: BloggerCourse.com  Fact Sheet for Healthcare Providers: SeriousBroker.it  This test is not yet approved or cleared by the [place]  FDA and has been authorized for detection and/or diagnosis of SARS-CoV-2 by FDA under an Emergency Use Authorization (EUA). This EUA will remain in effect (meaning this test can be used) for the duration of the COVID-19 declaration under Section 564(b)(1) of the Act, 21 U.S.C.  section 360bbb-3(b)(1), unless the authorization is terminated or revoked.     Resp Syncytial Virus by PCR NEGATIVE NEGATIVE Final    Comment: (NOTE) Fact Sheet for Patients: BloggerCourse.com  Fact Sheet for Healthcare Providers: SeriousBroker.it  This test is not yet approved or cleared by the [place]  FDA and has been authorized for detection and/or diagnosis of SARS-CoV-2 by FDA under an Emergency Use Authorization (EUA). This EUA will remain in effect (meaning this test can be used) for the duration of the COVID-19 declaration under Section 564(b)(1) of the Act, 21 U.S.C. section 360bbb-3(b)(1), unless the authorization is terminated or revoked.  Performed at [facility], [address] [place], [place] [address]   Culture, blood (routine x 2)     Status: None (Preliminary result)   Collection Time: 02/13/24  6:14 AM   Specimen: Right Antecubital; Blood  Result Value Ref Range Status   Specimen Description RIGHT ANTECUBITAL  Final   Special Requests   Final    BOTTLES DRAWN AEROBIC AND ANAEROBIC Blood Culture results may not be optimal due to an inadequate volume of blood received in culture bottles   Culture   Final    NO GROWTH 2 DAYS Performed at [facility], [address],  [place], [place] [address]    Report Status PENDING  Incomplete  Culture, blood (routine x 2)     Status: None (Preliminary result)   Collection Time: 02/13/24  6:14 AM   Specimen: Left Antecubital; Blood  Result Value Ref Range Status   Specimen Description LEFT ANTECUBITAL  Final   Special Requests   Final    BOTTLES DRAWN AEROBIC AND ANAEROBIC Blood Culture results may not be optimal due to an inadequate volume of blood received in culture bottles   Culture   Final    NO GROWTH 2 DAYS Performed at [facility], [address], [place], [place] [address]    Report Status PENDING  Incomplete  Expectorated Sputum Assessment w Gram Stain, Rflx to Resp Cult     Status: None   Collection Time: 02/13/24  8:20 AM   Specimen: Sputum  Result Value Ref Range Status   Specimen Description SPUTUM  Final   Special Requests NONE  Final   Sputum evaluation   Final    Sputum specimen not acceptable for testing.  Please recollect.   RESULT CALLED TO, READ BACK BY AND VERIFIED WITH: LUCIE LEMMINGS, RN AT [phone] 02/13/24 RAM Performed at [facility], [address], [place], [place] [address]    Report Status 02/13/2024 FINAL  Final  Respiratory (~20 pathogens) panel by PCR     Status: None   Collection Time: 02/13/24  8:20 AM   Specimen: Expectorated Sputum; Respiratory  Result Value Ref Range Status   Adenovirus NOT DETECTED NOT DETECTED Final   Coronavirus 229E NOT DETECTED NOT DETECTED Final    Comment: (NOTE) The Coronavirus on the Respiratory Panel, DOES NOT test for the novel  Coronavirus (2019 nCoV)    Coronavirus HKU1 NOT DETECTED NOT DETECTED Final   Coronavirus NL63 NOT DETECTED NOT DETECTED Final   Coronavirus OC43 NOT DETECTED NOT DETECTED Final   Metapneumovirus NOT DETECTED NOT DETECTED Final   Rhinovirus / Enterovirus NOT DETECTED NOT DETECTED Final   Influenza A NOT DETECTED NOT DETECTED Final   Influenza B NOT DETECTED NOT DETECTED Final   Parainfluenza  Virus 1 NOT DETECTED NOT DETECTED Final   Parainfluenza Virus 2 NOT DETECTED NOT DETECTED Final  Parainfluenza Virus 3 NOT DETECTED NOT DETECTED Final   Parainfluenza Virus 4 NOT DETECTED NOT DETECTED Final   Respiratory Syncytial Virus NOT DETECTED NOT DETECTED Final   Bordetella pertussis NOT DETECTED NOT DETECTED Final   Bordetella Parapertussis NOT DETECTED NOT DETECTED Final   Chlamydophila pneumoniae NOT DETECTED NOT DETECTED Final   Mycoplasma pneumoniae NOT DETECTED NOT DETECTED Final    Comment: Performed at [facility], [address], [place], [place] [address]     Labs: BNP (last 3 results) Recent Labs    05/10/23 2335 02/13/24 0614  BNP 137.8* 69.3   Basic Metabolic Panel: Recent Labs  Lab 02/13/24 0614 02/14/24 0558  NA 142 140  K 4.7 4.6  CL 104 105  CO2 31 28  GLUCOSE 166* 147*  BUN 24* 36*  CREATININE 0.84 0.67  CALCIUM  8.1* 8.2*   Liver Function Tests: Recent Labs  Lab 02/13/24 0614  AST 15  ALT 16  ALKPHOS 29*  BILITOT 0.2  PROT 5.5*  ALBUMIN 3.0*   No results for input(s): LIPASE, AMYLASE in the last 168 hours. No results for input(s): AMMONIA in the last 168 hours. CBC: Recent Labs  Lab 02/13/24 0614 02/13/24 1613 02/13/24 2218 02/14/24 0558 02/15/24 0503  WBC 18.4* 16.0*  --  10.9* 15.0*  NEUTROABS 10.3*  --   --   --   --   HGB 11.4* 10.7* 10.5* 9.3* 9.3*  HCT 37.3* 34.6* 33.2* 29.3* 29.8*  MCV 93.5 92.0  --  91.0 91.7  PLT 217 197  --  178 189   Cardiac Enzymes: No results for input(s): CKTOTAL, CKMB, CKMBINDEX, TROPONINI in the last 168 hours. BNP: Invalid input(s): POCBNP CBG: No results for input(s): GLUCAP in the last 168 hours. D-Dimer No results for input(s): DDIMER in the last 72 hours. Hgb A1c No results for input(s): HGBA1C in the last 72 hours. Lipid Profile No results for input(s): CHOL, HDL, LDLCALC, TRIG, CHOLHDL, LDLDIRECT in the last 72 hours. Thyroid  function  studies No results for input(s): TSH, T4TOTAL, T3FREE, THYROIDAB in the last 72 hours.  Invalid input(s): FREET3 Anemia work up No results for input(s): VITAMINB12, FOLATE, FERRITIN, TIBC, IRON, RETICCTPCT in the last 72 hours. Urinalysis    Component Value Date/Time   COLORURINE YELLOW (A) 02/13/2024 0556   APPEARANCEUR CLEAR (A) 02/13/2024 0556   APPEARANCEUR Clear 03/06/2017 1414   LABSPEC 1.017 02/13/2024 0556   PHURINE 6.0 02/13/2024 0556   GLUCOSEU NEGATIVE 02/13/2024 0556   HGBUR NEGATIVE 02/13/2024 0556   BILIRUBINUR NEGATIVE 02/13/2024 0556   BILIRUBINUR Negative 03/06/2017 1414   KETONESUR NEGATIVE 02/13/2024 0556   PROTEINUR NEGATIVE 02/13/2024 0556   NITRITE NEGATIVE 02/13/2024 0556   LEUKOCYTESUR NEGATIVE 02/13/2024 0556   Sepsis Labs Recent Labs  Lab 02/13/24 0614 02/13/24 1613 02/14/24 0558 02/15/24 0503  WBC 18.4* 16.0* 10.9* 15.0*   Microbiology Recent Results (from the past 240 hours)  Resp panel by RT-PCR (RSV, Flu A&B, Covid) Anterior Nasal Swab     Status: None   Collection Time: 02/13/24  6:14 AM   Specimen: Anterior Nasal Swab  Result Value Ref Range Status   SARS Coronavirus 2 by RT PCR NEGATIVE NEGATIVE Final    Comment: (NOTE) SARS-CoV-2 target nucleic acids are NOT DETECTED.  The SARS-CoV-2 RNA is generally detectable in upper respiratory specimens during the acute phase of infection. The lowest concentration of SARS-CoV-2 viral copies this assay can detect is 138 copies/mL. A negative result does not preclude SARS-Cov-2  infection and should not be used as the sole basis for treatment or other patient management decisions. A negative result may occur with  improper specimen collection/handling, submission of specimen other than nasopharyngeal swab, presence of viral mutation(s) within the areas targeted by this assay, and inadequate number of viral copies(<138 copies/mL). A negative result must be combined  with clinical observations, patient history, and epidemiological information. The expected result is Negative.  Fact Sheet for Patients:  BloggerCourse.com  Fact Sheet for Healthcare Providers:  SeriousBroker.it  This test is no t yet approved or cleared by the [place]  FDA and  has been authorized for detection and/or diagnosis of SARS-CoV-2 by FDA under an Emergency Use Authorization (EUA). This EUA will remain  in effect (meaning this test can be used) for the duration of the COVID-19 declaration under Section 564(b)(1) of the Act, 21 U.S.C.section 360bbb-3(b)(1), unless the authorization is terminated  or revoked sooner.       Influenza A by PCR NEGATIVE NEGATIVE Final   Influenza B by PCR NEGATIVE NEGATIVE Final    Comment: (NOTE) The Xpert Xpress SARS-CoV-2/FLU/RSV plus assay is intended as an aid in the diagnosis of influenza from Nasopharyngeal swab specimens and should not be used as a sole basis for treatment. Nasal washings and aspirates are unacceptable for Xpert Xpress SARS-CoV-2/FLU/RSV testing.  Fact Sheet for Patients: BloggerCourse.com  Fact Sheet for Healthcare Providers: SeriousBroker.it  This test is not yet approved or cleared by the [place]  FDA and has been authorized for detection and/or diagnosis of SARS-CoV-2 by FDA under an Emergency Use Authorization (EUA). This EUA will remain in effect (meaning this test can be used) for the duration of the COVID-19 declaration under Section 564(b)(1) of the Act, 21 U.S.C. section 360bbb-3(b)(1), unless the authorization is terminated or revoked.     Resp Syncytial Virus by PCR NEGATIVE NEGATIVE Final    Comment: (NOTE) Fact Sheet for Patients: BloggerCourse.com  Fact Sheet for Healthcare Providers: SeriousBroker.it  This test is not yet approved  or cleared by the [place]  FDA and has been authorized for detection and/or diagnosis of SARS-CoV-2 by FDA under an Emergency Use Authorization (EUA). This EUA will remain in effect (meaning this test can be used) for the duration of the COVID-19 declaration under Section 564(b)(1) of the Act, 21 U.S.C. section 360bbb-3(b)(1), unless the authorization is terminated or revoked.  Performed at [facility], [address] [place], [place] [address]   Culture, blood (routine x 2)     Status: None (Preliminary result)   Collection Time: 02/13/24  6:14 AM   Specimen: Right Antecubital; Blood  Result Value Ref Range Status   Specimen Description RIGHT ANTECUBITAL  Final   Special Requests   Final    BOTTLES DRAWN AEROBIC AND ANAEROBIC Blood Culture results may not be optimal due to an inadequate volume of blood received in culture bottles   Culture   Final    NO GROWTH 2 DAYS Performed at [facility], [address] [place], [place] [address]    Report Status PENDING  Incomplete  Culture, blood (routine x 2)     Status: None (Preliminary result)   Collection Time: 02/13/24  6:14 AM   Specimen: Left Antecubital; Blood  Result Value Ref Range Status   Specimen Description LEFT ANTECUBITAL  Final   Special Requests   Final    BOTTLES DRAWN AEROBIC AND ANAEROBIC Blood Culture results may not be optimal due to an inadequate volume of blood  received in culture bottles   Culture   Final    NO GROWTH 2 DAYS Performed at [facility], [address] [place], [place] [address]    Report Status PENDING  Incomplete  Expectorated Sputum Assessment w Gram Stain, Rflx to Resp Cult     Status: None   Collection Time: 02/13/24  8:20 AM   Specimen: Sputum  Result Value Ref Range Status   Specimen Description SPUTUM  Final   Special Requests NONE  Final   Sputum evaluation   Final    Sputum specimen not acceptable for testing.  Please recollect.   RESULT CALLED  TO, READ BACK BY AND VERIFIED WITH: LUCIE LEMMINGS, RN AT [phone] 02/13/24 RAM Performed at [facility], [address], [place], [place] [address]    Report Status 02/13/2024 FINAL  Final  Respiratory (~20 pathogens) panel by PCR     Status: None   Collection Time: 02/13/24  8:20 AM   Specimen: Expectorated Sputum; Respiratory  Result Value Ref Range Status   Adenovirus NOT DETECTED NOT DETECTED Final   Coronavirus 229E NOT DETECTED NOT DETECTED Final    Comment: (NOTE) The Coronavirus on the Respiratory Panel, DOES NOT test for the novel  Coronavirus (2019 nCoV)    Coronavirus HKU1 NOT DETECTED NOT DETECTED Final   Coronavirus NL63 NOT DETECTED NOT DETECTED Final   Coronavirus OC43 NOT DETECTED NOT DETECTED Final   Metapneumovirus NOT DETECTED NOT DETECTED Final   Rhinovirus / Enterovirus NOT DETECTED NOT DETECTED Final   Influenza A NOT DETECTED NOT DETECTED Final   Influenza B NOT DETECTED NOT DETECTED Final   Parainfluenza Virus 1 NOT DETECTED NOT DETECTED Final   Parainfluenza Virus 2 NOT DETECTED NOT DETECTED Final   Parainfluenza Virus 3 NOT DETECTED NOT DETECTED Final   Parainfluenza Virus 4 NOT DETECTED NOT DETECTED Final   Respiratory Syncytial Virus NOT DETECTED NOT DETECTED Final   Bordetella pertussis NOT DETECTED NOT DETECTED Final   Bordetella Parapertussis NOT DETECTED NOT DETECTED Final   Chlamydophila pneumoniae NOT DETECTED NOT DETECTED Final   Mycoplasma pneumoniae NOT DETECTED NOT DETECTED Final    Comment: Performed at [facility], [address] [place], [place] [address]   Imaging DG Chest 1 View Result Date: 02/13/2024 CLINICAL DATA:  Congestive heart failure EXAM: CHEST  1 VIEW COMPARISON:  02/13/2024 at 6:24 a.m. FINDINGS: Apical lordotic projection. Emphysema. Pulmonary nodule shown on the CT chest from 09/02/2023 are not well visualized on conventional radiography, and would probably be better followed with chest CT at the appropriate  interval. Heart size within normal limits. No current edema observed. No blunting of the costophrenic angles. IMPRESSION: 1. No current edema or pleural effusion. 2. Emphysema. 3. Pulmonary nodules shown on the CT chest from 09/02/2023 are not well visualized on conventional radiography, and would probably be better followed with chest CT at the appropriate interval. Electronically Signed   By: Ryan Salvage M.D.   On: 02/13/2024 16:45   DG Chest Port 1 View Result Date: 02/13/2024 EXAM: 1 VIEW(S) XRAY OF THE CHEST 02/13/2024 06:28:00 AM COMPARISON: Chest CT without contrast 01/22/2024. CLINICAL HISTORY: SOB, Hx of COPD. FINDINGS: LUNGS AND PLEURA: The lungs are emphysematous but radiographically clear. Scattered pulmonary nodules described in the chest CT report are not visible on this radiograph. Follow up as indicated. No pulmonary edema. No pleural effusion. No pneumothorax. HEART AND MEDIASTINUM: No acute abnormality of the cardiac and mediastinal silhouettes. BONES AND SOFT TISSUES:  No acute osseous abnormality. IMPRESSION: 1. Emphysematous lungs, radiographically clear. 2. Pulmonary nodules seen on prior CT are not visualized on this radiograph; recommend CT-based follow-up as clinically warranted. Electronically signed by: Francis Quam MD 02/13/2024 06:36 AM EDT RP Workstation: [id number]      Time coordinating discharge: over 30 minutes  SIGNED:  Zada Haser DO [facility]

## 2024-02-15 NOTE — Progress Notes (Signed)
 Patient Aox4, DC order in, PIV removed. AVS given. Tele removed. No further questions from patient/family. Meds to bed delivered. On 2L . Waiting for home portable O2/ride.

## 2024-02-15 NOTE — Plan of Care (Signed)

## 2024-02-18 DIAGNOSIS — J9610 Chronic respiratory failure, unspecified whether with hypoxia or hypercapnia: Secondary | ICD-10-CM | POA: Diagnosis not present

## 2024-02-18 DIAGNOSIS — D509 Iron deficiency anemia, unspecified: Secondary | ICD-10-CM | POA: Diagnosis not present

## 2024-02-18 DIAGNOSIS — J441 Chronic obstructive pulmonary disease with (acute) exacerbation: Secondary | ICD-10-CM | POA: Diagnosis not present

## 2024-02-18 LAB — CULTURE, BLOOD (ROUTINE X 2)
Culture: NO GROWTH
Culture: NO GROWTH

## 2024-02-23 DIAGNOSIS — R918 Other nonspecific abnormal finding of lung field: Secondary | ICD-10-CM | POA: Diagnosis not present

## 2024-02-23 DIAGNOSIS — Z9981 Dependence on supplemental oxygen: Secondary | ICD-10-CM | POA: Diagnosis not present

## 2024-02-23 DIAGNOSIS — Z87891 Personal history of nicotine dependence: Secondary | ICD-10-CM | POA: Diagnosis not present

## 2024-02-23 DIAGNOSIS — J449 Chronic obstructive pulmonary disease, unspecified: Secondary | ICD-10-CM | POA: Diagnosis not present

## 2024-02-23 DIAGNOSIS — C329 Malignant neoplasm of larynx, unspecified: Secondary | ICD-10-CM | POA: Diagnosis not present

## 2024-02-27 DIAGNOSIS — D509 Iron deficiency anemia, unspecified: Secondary | ICD-10-CM | POA: Diagnosis not present

## 2024-02-27 DIAGNOSIS — J441 Chronic obstructive pulmonary disease with (acute) exacerbation: Secondary | ICD-10-CM | POA: Diagnosis not present

## 2024-02-27 DIAGNOSIS — D500 Iron deficiency anemia secondary to blood loss (chronic): Secondary | ICD-10-CM | POA: Diagnosis not present

## 2024-03-01 DIAGNOSIS — B379 Candidiasis, unspecified: Secondary | ICD-10-CM | POA: Diagnosis not present

## 2024-03-01 DIAGNOSIS — J040 Acute laryngitis: Secondary | ICD-10-CM | POA: Diagnosis not present

## 2024-03-02 NOTE — Progress Notes (Signed)
 ENCOUNTER: Patient Class :No patient class for patient encounter Department: [facility] CLINIC [address] [place] [address]  PATIENT: Patient Demographics      Name Patient ID SSN Gender Identity Birth Date   Deshane, Cotroneo [id number] Male [birth year] (68 yrs)          Address Phone Email       [address] [place] [address] [phone] [email or web address]) [email or web address]            [facility] Caucasian/White             Reg Status PCP Date Last Verified Next Review Date     Verified Cleotilde Oneil Novel [id number] 02/11/24 03/12/24           Marital Status Religion Language       Married Unknown-Patient Declined English              EMERGENCY CONTACT: Name Relationship Lgl Grd Work Marine Scientist Phone  1. ABDOU, STOCKS* Spouse   [phone]   2. [facility] Brother or Sis*   [phone]     GUARANTOR: There is no guarantor information entered for this encounter.  COVERAGE: Primary Visit Coverage      Payer Plan Group Number Group Name Payer Phone Plan Phone   No coverage found                Secondary Visit Coverage      Payer Plan Group Number Group Name Payer Phone Plan Phone   No coverage found                Primary Coverage      Payer Plan Group Number Group Name Payer Phone Plan Phone   [facility] MEDICARE ADVANTAGE SABLE CHUTE [facility] GOLD PLUS [id number] [facility] [place].  [phone]           Primary Subscriber      Subscriber ID Subscriber Name Subscriber [facility] Subscriber Address   [id number] CYNTHIA, STAINBACK [id number] [address]      [place], [place] [address]           Secondary Coverage      Payer Plan Group Number Group Name Payer Phone Plan Phone   No coverage found

## 2024-03-04 ENCOUNTER — Other Ambulatory Visit: Payer: Self-pay | Admitting: Oncology

## 2024-03-04 ENCOUNTER — Other Ambulatory Visit: Payer: Self-pay

## 2024-03-04 ENCOUNTER — Telehealth: Payer: Self-pay | Admitting: Oncology

## 2024-03-04 DIAGNOSIS — D509 Iron deficiency anemia, unspecified: Secondary | ICD-10-CM | POA: Insufficient documentation

## 2024-03-04 NOTE — Telephone Encounter (Signed)
 Per referral msg schedule labs in 1-2 weeks and MD/venofer Day 2.  I scheduled appts and left vm with appt details and scheduling phone number for pt.   Mailing AVS now

## 2024-03-11 DIAGNOSIS — Z2821 Immunization not carried out because of patient refusal: Secondary | ICD-10-CM | POA: Diagnosis not present

## 2024-03-11 DIAGNOSIS — J431 Panlobular emphysema: Secondary | ICD-10-CM | POA: Diagnosis not present

## 2024-03-11 DIAGNOSIS — R911 Solitary pulmonary nodule: Secondary | ICD-10-CM | POA: Diagnosis not present

## 2024-03-11 DIAGNOSIS — R0609 Other forms of dyspnea: Secondary | ICD-10-CM | POA: Diagnosis not present

## 2024-03-15 ENCOUNTER — Inpatient Hospital Stay: Attending: Oncology

## 2024-03-15 ENCOUNTER — Inpatient Hospital Stay

## 2024-03-15 DIAGNOSIS — D509 Iron deficiency anemia, unspecified: Secondary | ICD-10-CM | POA: Diagnosis not present

## 2024-03-15 DIAGNOSIS — Z8521 Personal history of malignant neoplasm of larynx: Secondary | ICD-10-CM | POA: Diagnosis not present

## 2024-03-15 LAB — CBC WITH DIFFERENTIAL (CANCER CENTER ONLY)
Abs Immature Granulocytes: 0.03 K/uL (ref 0.00–0.07)
Basophils Absolute: 0 K/uL (ref 0.0–0.1)
Basophils Relative: 0 %
Eosinophils Absolute: 0.2 K/uL (ref 0.0–0.5)
Eosinophils Relative: 2 %
HCT: 38.6 % — ABNORMAL LOW (ref 39.0–52.0)
Hemoglobin: 12.1 g/dL — ABNORMAL LOW (ref 13.0–17.0)
Immature Granulocytes: 0 %
Lymphocytes Relative: 18 %
Lymphs Abs: 1.2 K/uL (ref 0.7–4.0)
MCH: 28.3 pg (ref 26.0–34.0)
MCHC: 31.3 g/dL (ref 30.0–36.0)
MCV: 90.2 fL (ref 80.0–100.0)
Monocytes Absolute: 0.6 K/uL (ref 0.1–1.0)
Monocytes Relative: 9 %
Neutro Abs: 4.7 K/uL (ref 1.7–7.7)
Neutrophils Relative %: 71 %
Platelet Count: 216 K/uL (ref 150–400)
RBC: 4.28 MIL/uL (ref 4.22–5.81)
RDW: 13.4 % (ref 11.5–15.5)
WBC Count: 6.7 K/uL (ref 4.0–10.5)
nRBC: 0 % (ref 0.0–0.2)

## 2024-03-15 LAB — SAMPLE TO BLOOD BANK

## 2024-03-15 LAB — IRON AND TIBC
Iron: 48 ug/dL (ref 45–182)
Saturation Ratios: 11 % — ABNORMAL LOW (ref 17.9–39.5)
TIBC: 428 ug/dL (ref 250–450)
UIBC: 380 ug/dL

## 2024-03-15 LAB — ABO/RH: ABO/RH(D): A POS

## 2024-03-15 LAB — FERRITIN: Ferritin: 23 ng/mL — ABNORMAL LOW (ref 24–336)

## 2024-03-16 ENCOUNTER — Inpatient Hospital Stay

## 2024-03-16 ENCOUNTER — Inpatient Hospital Stay (HOSPITAL_BASED_OUTPATIENT_CLINIC_OR_DEPARTMENT_OTHER): Admitting: Oncology

## 2024-03-16 ENCOUNTER — Encounter: Payer: Self-pay | Admitting: Oncology

## 2024-03-16 ENCOUNTER — Inpatient Hospital Stay: Admitting: Oncology

## 2024-03-16 VITALS — BP 108/76 | HR 84 | Temp 98.3°F | Resp 16 | Wt 180.5 lb

## 2024-03-16 VITALS — BP 117/86 | HR 79 | Resp 17

## 2024-03-16 DIAGNOSIS — R911 Solitary pulmonary nodule: Secondary | ICD-10-CM

## 2024-03-16 DIAGNOSIS — C329 Malignant neoplasm of larynx, unspecified: Secondary | ICD-10-CM

## 2024-03-16 DIAGNOSIS — Z8521 Personal history of malignant neoplasm of larynx: Secondary | ICD-10-CM | POA: Diagnosis not present

## 2024-03-16 DIAGNOSIS — D509 Iron deficiency anemia, unspecified: Secondary | ICD-10-CM | POA: Diagnosis not present

## 2024-03-16 MED ORDER — IRON SUCROSE 20 MG/ML IV SOLN
200.0000 mg | Freq: Once | INTRAVENOUS | Status: AC
  Administered 2024-03-16: 200 mg via INTRAVENOUS
  Filled 2024-03-16: qty 10

## 2024-03-16 MED ORDER — SODIUM CHLORIDE 0.9% FLUSH
10.0000 mL | Freq: Once | INTRAVENOUS | Status: AC | PRN
  Administered 2024-03-16: 10 mL
  Filled 2024-03-16: qty 10

## 2024-03-16 NOTE — Assessment & Plan Note (Signed)
#   Lung nodules.  Imaging was reviewed and discussed with patient.  05/23/22 CT scan showed New 8 x 5 mm RIGHT upper lobe pulmonary nodule, new right middle lobe nodules, wax and wane other nodules, chronic infection/inflammation vs malignancy 08/29/23 CT showed stable nodules, new lung nodules.   01/22/2024, CT chest showed Two of the three nodules that were new on prior exam are unchanged over the course of 4 months, largest 8 mm.  Other nodules are stable.  Patient has wax and wane lung nodules.  I recommend to repeat CT scan in 6 months

## 2024-03-16 NOTE — Progress Notes (Signed)
 Hematology/Oncology Progress note [phone] [phone]      ASSESSMENT & PLAN:   Cancer of larynx (HCC) # Stage III supraglottic larynx cancer Clinically he is doing well.  He is 10 year post completion of treatment. Monitor thyroid  function annually.     Lung nodule # Lung nodules.  Imaging was reviewed and discussed with patient.  05/23/22 CT scan showed New 8 x 5 mm RIGHT upper lobe pulmonary nodule, new right middle lobe nodules, wax and wane other nodules, chronic infection/inflammation vs malignancy 08/29/23 CT showed stable nodules, new lung nodules.   01/22/2024, CT chest showed Two of the three nodules that were new on prior exam are unchanged over the course of 4 months, largest 8 mm.  Other nodules are stable.  Patient has wax and wane lung nodules.  I recommend to repeat CT scan in 6 months  Iron deficiency anemia Lab Results  Component Value Date   HGB 12.1 (L) 03/15/2024   TIBC 428 03/15/2024   IRONPCTSAT 11 (L) 03/15/2024   FERRITIN 23 (L) 03/15/2024    Hemoglobin has improved.  Etiology of iron deficient anemia blood-tinged sputum and possible GI blood loss. I discussed about option of  IV Venofer treatments. I discussed about the potential risks including but not limited to allergic reactions/infusion reactions including anaphylactic reactions, diarrhea, phlebitis, high blood pressure, wheezing, SOB, skin rash, weight gain,dark urine, leg swelling, back pain, headache, nausea and fatigue, etc. Patient is interested in IV Venofer treatments.  Plan IV venofer weekly x 2    Orders Placed This Encounter  Procedures   CBC with Differential (Cancer Center Only)    Standing Status:   Future    Expected Date:   06/16/2024    Expiration Date:   09/14/2024   Iron and TIBC    Standing Status:   Future    Expected Date:   06/16/2024    Expiration Date:   09/14/2024   Ferritin    Standing Status:   Future    Expected Date:   06/16/2024     Expiration Date:   09/14/2024   TSH    Standing Status:   Future    Expected Date:   06/16/2024    Expiration Date:   09/14/2024   Follow-up in 3 months  All questions were answered. The patient knows to call the clinic with any problems, questions or concerns.  Zelphia Cap, MD, PhD [facility] Hematology Oncology 03/16/2024      Chief Complaint: Benjamin Torres is a 68 y.o. 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 PET scan on 08/18/2013 revealed hypermetabolic soft tissue lesion in the right hypopharynx consistent with epiglottic lesion. There also was a high right paratracheal lymph node which was also hypermetabolic and suspicious for metastatic disease.  Clinical stage was T2N1M0.   He was treated with radiation therapy and cetuximab beginning 09/07/2013.  He received 7,000 cGy from 09/07/2013 - 11/10/2013.     06/21/2015 PET scan on  revealed no evidence of recurrent disease.  02/12/2017 PET revealed no evidence of hypermetabolic locoregional or distant metastatic disease.  11/2016 ENT evaluation iwas negative per patient.   He has  hypothyroidism.  He is on Synthroid .   He has a posterior bladder diverticulum and prostatomegaly, which causes him to experience LUTS. PSA was 1.91 on 01/13/2017.   EGD on 03/07/2017 revealed a medium sized hiatal hernia, moderate inflammation in the stomach, and mild inflammation in the duodenal bulb. Findings c/w gastritis and duodenitis. Pathology demonstrated chronic inactive gastritis. Negative for H.pylori.    Colonoscopy on 03/07/2017 revealed two 4-5 mm sessile polyps in the  descending colon, one 2 mm polyp in the sigmoid colon, diverticulosis, and non-bleeding internal hemorrhoids. Pathology showed that the 2 polyps from the descending colon were tubular adenomas, and the lesion from the sigmoid was a hyperplastic polyp.   April 2022 hospitalized due to pneumonia.   07/31/21 CT lung cancer screening- 1. Lung-RADS 4A, suspicious.  Left lower lobe nudule 7mm, left upper lobe nodule 8.33mm  10/31/21 CT lung cancer screening- 1. Lung-RADS 4A, suspicious.  left upper lobe nodule 8.6mm, new left upper lobe nodule 7.65mm, new 7 mm right upper lobe perifissural nodule   11/14/2021, PET scan showed no hypermetabolic pulmonary nodules, majority of the nodules are just at or below the resolution of PET scan.   02/18/2022, CT scan showed interval development of several new lung nodules and slight increase of previously seen nodules.  Unclear etiology.  Nodules are very small and likely will PET scan detectable sensitivity.  09/02/2023 CT chest without contrast showed new 10 mm nodule in the left lower lobe and 8 mm nodule within the right lower lobe.  Small hiatal hernia, cholelithiasis.  Aortic coronary artery atherosclerosis.  Emphysema.  01/22/2024 CT chest wo contrast  1. Two of the three nodules that were new on prior exam are unchanged over the course of 4 months, largest 8 mm. In the setting of emphysema and prior malignancy, metastasis or primary bronchogenic malignancy are considered. . 2. Additional tiny nodules are stable from prior. No new pulmonary nodules. 3. Small hiatal hernia. 4. Coronary artery calcifications. 5. Aortic Atherosclerosis and Emphysema   INTERVAL HISTORY Benjamin Torres is a 68 y.o. 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 He is not on chronic oxygen supplementation.  Nasal cannula 2 L.  No new  complaints.  Patient was recently hospitalized due to acute COPD exacerbation, acute on chronic respiratory failure.  Patient has bloody sputum and dark stool.  Hemoglobin decreased during hospitalization.  Patient is on anticoagulation.  Denies active rectal bleeding. Blood-tinged sputum has resolved since discharge.  Patient takes oral iron supplementation.  Past Medical History:  Diagnosis Date   Atherosclerosis of native coronary artery of native heart with stable angina pectoris    Atrial fibrillation (HCC)    Cancer of larynx (HCC) 11/06/2014   CHF (congestive heart failure) (HCC)    Chronic respiratory failure with hypoxia (HCC)    COPD (chronic obstructive pulmonary disease) (HCC)    Dilated cardiomyopathy (HCC)    Hyperlipidemia    Hypertension    Hypothyroidism    PE (pulmonary thromboembolism) (HCC)    right lung, over 10 yrs ago   Supplemental oxygen dependent    Wears dentures    Full upper and lower.  Has, does NOT wear   Wears dentures     Past Surgical History:  Procedure Laterality Date   ANAL FISSURE REPAIR     APPENDECTOMY     BIOPSY PHARYNX  2015   Larynx BX   CATARACT EXTRACTION W/PHACO Right 09/03/2023   Procedure: PHACOEMULSIFICATION, CATARACT, WITH IOL INSERTION 4.97 00:30.5;  Surgeon: Mittie Gaskin, MD;  Location: [facility];  Service: Ophthalmology;  Laterality: Right;   COLONOSCOPY WITH PROPOFOL  N/A 03/07/2017   Procedure: COLONOSCOPY WITH PROPOFOL ;  Surgeon: Jinny Carmine, MD;  Location: [facility];  Service: Endoscopy;  Laterality: N/A;   ESOPHAGOGASTRODUODENOSCOPY (EGD) WITH PROPOFOL  N/A 03/07/2017   Procedure: ESOPHAGOGASTRODUODENOSCOPY (EGD) WITH PROPOFOL ;  Surgeon: Jinny Carmine, MD;  Location: [facility];  Service: Endoscopy;  Laterality: N/A;   LEFT HEART CATH AND CORONARY ANGIOGRAPHY Left 09/23/2019   Procedure: LEFT HEART CATH AND CORONARY ANGIOGRAPHY;  Surgeon: Bosie Vinie LABOR, MD;  Location: [facility] INVASIVE CV  LAB;  Service: Cardiovascular;  Laterality: Left;   POLYPECTOMY  03/07/2017   Procedure: POLYPECTOMY;  Surgeon: Jinny Carmine, MD;  Location: [facility];  Service: Endoscopy;;    Family History  Problem Relation Age of Onset   CAD Mother    Cancer - Lung Father     Social History:  reports that he quit smoking about a year ago. His smoking use included e-cigarettes and cigarettes. He started smoking about 50 years ago. He has a 74.9 pack-year smoking history. He has never used smokeless tobacco. He reports that he does not drink alcohol and does not use drugs.  Allergies: No Known Allergies  Current Medications: Current Outpatient Medications  Medication Sig Dispense Refill   acetaminophen  (TYLENOL ) 325 MG tablet Take 2 tablets (650 mg total) by mouth every 6 (six) hours as needed for mild pain (pain score 1-3).     albuterol  (PROVENTIL  HFA;VENTOLIN  HFA) 108 (90 Base) MCG/ACT inhaler Inhale 1 puff into the lungs every 4 (four) hours as needed for wheezing or shortness of breath.     albuterol  (PROVENTIL ) (2.5 MG/3ML) 0.083% nebulizer solution Take 3 mLs (2.5 mg total) by nebulization every 6 (six) hours as needed for wheezing or shortness of breath. 360 mL 0   ALPRAZolam  (XANAX ) 0.5 MG tablet Take 0.5 mg by mouth 2 (two) times daily.     apixaban  (ELIQUIS ) 5 MG TABS tablet Take 1 tablet (5 mg total) by mouth 2 (two) times daily. 60 tablet 0   Budeson-Glycopyrrol-Formoterol (BREZTRI  AEROSPHERE) 160-9-4.8 MCG/ACT AERO Inhale 2 puffs into the lungs in the morning and at bedtime. 32.1 g 3   cyanocobalamin  (,VITAMIN B-12,) 1000 MCG/ML injection Inject 1,000 mcg into the muscle every 30 (thirty) days.     ezetimibe  (ZETIA ) 10 MG tablet Take 1 tablet (10 mg total) by mouth daily. 90 tablet 3   gabapentin  (NEURONTIN ) 100 MG capsule Take 100 mg by mouth daily.     Iron-Vitamin C (VITRON-C) 65-125 MG TABS Take by mouth.     levothyroxine  (SYNTHROID ) 100 MCG tablet Take 100 mcg by mouth daily  before breakfast.     metoprolol  succinate (TOPROL -XL) 50 MG 24 hr tablet Take 0.5 tablets (25 mg total) by mouth daily.     montelukast  (SINGULAIR ) 10 MG tablet Take 10 mg by mouth daily.     OXYGEN Inhale into the lungs. At night and with exertion     pantoprazole (PROTONIX) 40 MG tablet Take 40 mg by mouth.     rosuvastatin  (CRESTOR ) 10 MG tablet Take 10 mg by mouth at bedtime.     tamsulosin  (FLOMAX ) 0.4 MG CAPS capsule Take 1 capsule (0.4 mg total) by mouth daily. 90 capsule 3   torsemide (DEMADEX) 10 MG tablet  Take 10 mg by mouth.     fluconazole (DIFLUCAN) 200 MG tablet Take 200 mg by mouth daily. (Patient not taking: Reported on 03/16/2024)     nicotine  (NICODERM CQ  - DOSED IN MG/24 HOURS) 21 mg/24hr patch Place 1 patch (21 mg total) onto the skin daily. (Patient not taking: Reported on 03/16/2024)     No current facility-administered medications for this visit.   Facility-Administered Medications Ordered in Other Visits  Medication Dose Route Frequency Provider Last Rate Last Admin   sodium chloride  flush (NS) 0.9 % injection 3 mL  3 mL Intravenous Q12H Bosie Vinie LABOR, MD        Review of Systems  Constitutional:  Positive for fatigue. Negative for appetite change, chills and fever.  HENT:   Negative for hearing loss and voice change.   Eyes:  Negative for eye problems.  Respiratory:  Negative for chest tightness and cough.   Cardiovascular:  Negative for chest pain.  Gastrointestinal:  Negative for abdominal distention, abdominal pain and blood in stool.  Endocrine: Negative for hot flashes.  Genitourinary:  Negative for difficulty urinating and frequency.   Musculoskeletal:  Negative for arthralgias.  Skin:  Negative for itching and rash.  Neurological:  Negative for extremity weakness.  Hematological:  Negative for adenopathy.  Psychiatric/Behavioral:  Negative for confusion.     Performance status (ECOG): 1 Vitals:   03/16/24 1305  BP: 108/76  Pulse: 84  Resp: 16   Temp: 98.3 F (36.8 C)  SpO2: 97%     Physical Exam Constitutional:      General: He is not in acute distress.    Appearance: He is not diaphoretic.  HENT:     Head: Normocephalic and atraumatic.  Eyes:     General: No scleral icterus. Cardiovascular:     Rate and Rhythm: Normal rate and regular rhythm.     Heart sounds: No murmur heard. Pulmonary:     Effort: Pulmonary effort is normal. No respiratory distress.     Breath sounds: No wheezing.     Comments: Decreased breath sounds bilaterally. Abdominal:     General: There is no distension.     Palpations: Abdomen is soft.     Tenderness: There is no abdominal tenderness.  Musculoskeletal:        General: Normal range of motion.     Cervical back: Normal range of motion and neck supple.  Skin:    General: Skin is warm and dry.     Findings: No erythema.  Neurological:     Mental Status: He is alert and oriented to person, place, and time. Mental status is at baseline.     Motor: No abnormal muscle tone.  Psychiatric:        Mood and Affect: Affect normal.    Labs    Latest Ref Rng & Units 03/15/2024    1:20 PM 02/15/2024    5:03 AM 02/14/2024    5:58 AM  CBC  WBC 4.0 - 10.5 K/uL 6.7  15.0  10.9   Hemoglobin 13.0 - 17.0 g/dL 87.8  9.3  9.3   Hematocrit 39.0 - 52.0 % 38.6  29.8  29.3   Platelets 150 - 400 K/uL 216  189  178       Latest Ref Rng & Units 02/14/2024    5:58 AM 02/13/2024    6:14 AM 10/20/2023    1:36 PM  CMP  Glucose 70 - 99 mg/dL 852  833  888  BUN 8 - 23 mg/dL 36  24  14   Creatinine 0.61 - 1.24 mg/dL 9.32  9.15  9.33   Sodium 135 - 145 mmol/L 140  142  135   Potassium 3.5 - 5.1 mmol/L 4.6  4.7  4.1   Chloride 98 - 111 mmol/L 105  104  103   CO2 22 - 32 mmol/L 28  31  25    Calcium  8.9 - 10.3 mg/dL 8.2  8.1  8.8   Total Protein 6.5 - 8.1 g/dL  5.5  6.7   Total Bilirubin 0.0 - 1.2 mg/dL  0.2  0.9   Alkaline Phos 38 - 126 U/L  29  44   AST 15 - 41 U/L  15  17   ALT 0 - 44 U/L  16  16     RADIOGRAPHIC STUDIES: I have personally reviewed the radiological images as listed and agreed with the findings in the report.  DG Chest 1 View Result Date: 02/13/2024 CLINICAL DATA:  Congestive heart failure EXAM: CHEST  1 VIEW COMPARISON:  02/13/2024 at 6:24 a.m. FINDINGS: Apical lordotic projection. Emphysema. Pulmonary nodule shown on the CT chest from 09/02/2023 are not well visualized on conventional radiography, and would probably be better followed with chest CT at the appropriate interval. Heart size within normal limits. No current edema observed. No blunting of the costophrenic angles. IMPRESSION: 1. No current edema or pleural effusion. 2. Emphysema. 3. Pulmonary nodules shown on the CT chest from 09/02/2023 are not well visualized on conventional radiography, and would probably be better followed with chest CT at the appropriate interval. Electronically Signed   By: Ryan Salvage M.D.   On: 02/13/2024 16:45   DG Chest Port 1 View Result Date: 02/13/2024 EXAM: 1 VIEW(S) XRAY OF THE CHEST 02/13/2024 06:28:00 AM COMPARISON: Chest CT without contrast 01/22/2024. CLINICAL HISTORY: SOB, Hx of COPD. FINDINGS: LUNGS AND PLEURA: The lungs are emphysematous but radiographically clear. Scattered pulmonary nodules described in the chest CT report are not visible on this radiograph. Follow up as indicated. No pulmonary edema. No pleural effusion. No pneumothorax. HEART AND MEDIASTINUM: No acute abnormality of the cardiac and mediastinal silhouettes. BONES AND SOFT TISSUES: No acute osseous abnormality. IMPRESSION: 1. Emphysematous lungs, radiographically clear. 2. Pulmonary nodules seen on prior CT are not visualized on this radiograph; recommend CT-based follow-up as clinically warranted. Electronically signed by: Francis Quam MD 02/13/2024 06:36 AM EDT RP Workstation: [id number]   CT Chest Wo Contrast Result Date: 01/27/2024 CLINICAL DATA:  Pulmonary nodule follow-up.  History larynx cancer.  EXAM: CT CHEST WITHOUT CONTRAST TECHNIQUE: Multidetector CT imaging of the chest was performed following the standard protocol without IV contrast. RADIATION DOSE REDUCTION: This exam was performed according to the departmental dose-optimization program which includes automated exposure control, adjustment of the mA and/or kV according to patient size and/or use of iterative reconstruction technique. COMPARISON:  Most recent radiograph 09/02/2023, additional priors reviewed. FINDINGS: Cardiovascular: The heart is normal in size. There are coronary artery calcifications. No pericardial effusion. Aortic atherosclerosis without aortic aneurysm. Mediastinum/Nodes: No enlarged mediastinal lymph nodes. No obvious bulky hilar adenopathy, hilar assessment is limited in the absence of IV contrast. Small hiatal hernia. Lungs/Pleura: Again seen moderate to advanced emphysema. Retained mucus in the trachea and central airways. No consolidative airspace disease. The left lower lobe nodule that was new on prior exam is unchanged measuring 8 mm, series 3, image 74. Left upper lobe nodule that was new on prior exam is unchanged measuring  6 mm, series 3, image 39. The 8 mm right lower lobe nodule that was new on prior exam has resolved. Multiple tiny additional pulmonary nodules are stable from prior. Stable area of scarring in the left upper lobe series 3, image 26. no new pulmonary nodule from prior. Upper Abdomen: Gallstones. Abdominal aortic atherosclerosis. No adrenal nodule or acute findings. Musculoskeletal: No lytic or blastic osseous lesions. No acute osseous findings IMPRESSION: 1. Two of the three nodules that were new on prior exam are unchanged over the course of 4 months, largest 8 mm. In the setting of emphysema and prior malignancy, metastasis or primary bronchogenic malignancy are considered. The largest nodule is borderline in size for PET characterization. The remaining lung nodule that was new on prior exam has  resolved. 2. Additional tiny nodules are stable from prior. No new pulmonary nodules. 3. Small hiatal hernia. 4. Coronary artery calcifications. 5. Aortic Atherosclerosis (ICD10-I70.0) and Emphysema (ICD10-J43.9). Electronically Signed   By: Andrea Gasman M.D.   On: 01/27/2024 17:31

## 2024-03-16 NOTE — Assessment & Plan Note (Signed)
#   Stage III supraglottic larynx cancer Clinically he is doing well.  He is 10 year post completion of treatment. Monitor thyroid  function annually.

## 2024-03-16 NOTE — Assessment & Plan Note (Addendum)
 Lab Results  Component Value Date   HGB 12.1 (L) 03/15/2024   TIBC 428 03/15/2024   IRONPCTSAT 11 (L) 03/15/2024   FERRITIN 23 (L) 03/15/2024    Hemoglobin has improved.  Etiology of iron deficient anemia blood-tinged sputum and possible GI blood loss. I discussed about option of  IV Venofer treatments. I discussed about the potential risks including but not limited to allergic reactions/infusion reactions including anaphylactic reactions, diarrhea, phlebitis, high blood pressure, wheezing, SOB, skin rash, weight gain,dark urine, leg swelling, back pain, headache, nausea and fatigue, etc. Patient is interested in IV Venofer treatments.  Plan IV venofer weekly x 2

## 2024-03-30 ENCOUNTER — Inpatient Hospital Stay: Attending: Oncology

## 2024-03-30 VITALS — BP 107/75 | HR 70 | Temp 97.6°F | Resp 16

## 2024-03-30 DIAGNOSIS — D509 Iron deficiency anemia, unspecified: Secondary | ICD-10-CM | POA: Diagnosis present

## 2024-03-30 MED ORDER — IRON SUCROSE 20 MG/ML IV SOLN
200.0000 mg | Freq: Once | INTRAVENOUS | Status: AC
  Administered 2024-03-30: 200 mg via INTRAVENOUS
  Filled 2024-03-30: qty 10

## 2024-03-30 NOTE — Patient Instructions (Signed)

## 2024-04-30 ENCOUNTER — Inpatient Hospital Stay
Admission: EM | Admit: 2024-04-30 | Discharge: 2024-05-03 | DRG: 193 | Disposition: A | Discharge status: Home / Self Care | Attending: Obstetrics and Gynecology | Admitting: Obstetrics and Gynecology

## 2024-04-30 ENCOUNTER — Emergency Department

## 2024-04-30 ENCOUNTER — Other Ambulatory Visit: Payer: Self-pay

## 2024-04-30 DIAGNOSIS — I480 Paroxysmal atrial fibrillation: Secondary | ICD-10-CM | POA: Diagnosis present

## 2024-04-30 DIAGNOSIS — C329 Malignant neoplasm of larynx, unspecified: Secondary | ICD-10-CM | POA: Diagnosis present

## 2024-04-30 DIAGNOSIS — R5381 Other malaise: Secondary | ICD-10-CM | POA: Diagnosis present

## 2024-04-30 DIAGNOSIS — Z7901 Long term (current) use of anticoagulants: Secondary | ICD-10-CM

## 2024-04-30 DIAGNOSIS — Z79899 Other long term (current) drug therapy: Secondary | ICD-10-CM

## 2024-04-30 DIAGNOSIS — Z8249 Family history of ischemic heart disease and other diseases of the circulatory system: Secondary | ICD-10-CM

## 2024-04-30 DIAGNOSIS — I5022 Chronic systolic (congestive) heart failure: Secondary | ICD-10-CM | POA: Diagnosis present

## 2024-04-30 DIAGNOSIS — Z9981 Dependence on supplemental oxygen: Secondary | ICD-10-CM

## 2024-04-30 DIAGNOSIS — N400 Enlarged prostate without lower urinary tract symptoms: Secondary | ICD-10-CM | POA: Diagnosis present

## 2024-04-30 DIAGNOSIS — Z86711 Personal history of pulmonary embolism: Secondary | ICD-10-CM

## 2024-04-30 DIAGNOSIS — I2510 Atherosclerotic heart disease of native coronary artery without angina pectoris: Secondary | ICD-10-CM | POA: Diagnosis present

## 2024-04-30 DIAGNOSIS — Z79890 Hormone replacement therapy (postmenopausal): Secondary | ICD-10-CM

## 2024-04-30 DIAGNOSIS — J9621 Acute and chronic respiratory failure with hypoxia: Secondary | ICD-10-CM | POA: Diagnosis present

## 2024-04-30 DIAGNOSIS — Z923 Personal history of irradiation: Secondary | ICD-10-CM

## 2024-04-30 DIAGNOSIS — J189 Pneumonia, unspecified organism: Principal | ICD-10-CM | POA: Diagnosis present

## 2024-04-30 DIAGNOSIS — Z87891 Personal history of nicotine dependence: Secondary | ICD-10-CM

## 2024-04-30 DIAGNOSIS — J441 Chronic obstructive pulmonary disease with (acute) exacerbation: Secondary | ICD-10-CM | POA: Diagnosis present

## 2024-04-30 DIAGNOSIS — G629 Polyneuropathy, unspecified: Secondary | ICD-10-CM | POA: Diagnosis present

## 2024-04-30 DIAGNOSIS — J9622 Acute and chronic respiratory failure with hypercapnia: Secondary | ICD-10-CM | POA: Diagnosis present

## 2024-04-30 DIAGNOSIS — I110 Hypertensive heart disease with heart failure: Secondary | ICD-10-CM | POA: Diagnosis present

## 2024-04-30 DIAGNOSIS — E785 Hyperlipidemia, unspecified: Secondary | ICD-10-CM | POA: Diagnosis present

## 2024-04-30 DIAGNOSIS — J9801 Acute bronchospasm: Secondary | ICD-10-CM

## 2024-04-30 DIAGNOSIS — Z8521 Personal history of malignant neoplasm of larynx: Secondary | ICD-10-CM

## 2024-04-30 DIAGNOSIS — Z1152 Encounter for screening for COVID-19: Secondary | ICD-10-CM

## 2024-04-30 DIAGNOSIS — I10 Essential (primary) hypertension: Secondary | ICD-10-CM | POA: Diagnosis present

## 2024-04-30 DIAGNOSIS — J9620 Acute and chronic respiratory failure, unspecified whether with hypoxia or hypercapnia: Principal | ICD-10-CM

## 2024-04-30 DIAGNOSIS — J440 Chronic obstructive pulmonary disease with acute lower respiratory infection: Secondary | ICD-10-CM | POA: Diagnosis present

## 2024-04-30 DIAGNOSIS — Z8551 Personal history of malignant neoplasm of bladder: Secondary | ICD-10-CM

## 2024-04-30 DIAGNOSIS — I255 Ischemic cardiomyopathy: Secondary | ICD-10-CM | POA: Diagnosis present

## 2024-04-30 DIAGNOSIS — E039 Hypothyroidism, unspecified: Secondary | ICD-10-CM | POA: Diagnosis present

## 2024-04-30 DIAGNOSIS — Z9221 Personal history of antineoplastic chemotherapy: Secondary | ICD-10-CM

## 2024-04-30 DIAGNOSIS — J9611 Chronic respiratory failure with hypoxia: Secondary | ICD-10-CM | POA: Diagnosis present

## 2024-04-30 MED ORDER — FAMOTIDINE IN NACL 20-0.9 MG/50ML-% IV SOLN
20.0000 mg | Freq: Once | INTRAVENOUS | Status: AC
  Administered 2024-04-30: 20 mg via INTRAVENOUS
  Filled 2024-04-30: qty 50

## 2024-04-30 MED ORDER — ONDANSETRON HCL 4 MG/2ML IJ SOLN
4.0000 mg | Freq: Once | INTRAMUSCULAR | Status: AC
  Administered 2024-04-30: 4 mg via INTRAVENOUS
  Filled 2024-04-30: qty 2

## 2024-04-30 MED ORDER — LORAZEPAM 2 MG/ML IJ SOLN
0.2500 mg | Freq: Once | INTRAMUSCULAR | Status: AC
  Administered 2024-04-30: 0.25 mg via INTRAVENOUS
  Filled 2024-04-30: qty 1

## 2024-04-30 NOTE — ED Triage Notes (Signed)
 Pt brought in from home by EMS for SOB. Per EMS, sudden onset about 1 hour. Pt states he wears 2L [place] baseline; is satting 93% on 2L [place]. Respirations labored and tachy. 1 albuterol  treatment, 1 duoneb treatment and 125mg  solumedrol given by EMS PTA.

## 2024-04-30 NOTE — ED Provider Notes (Signed)
 "  [facility] Provider Note    Event Date/Time   First MD Initiated Contact with Patient 04/30/24 2331     (approximate)   History   Shortness of Breath   HPI  Benjamin Torres is a 69 y.o. 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  Patient has been intermittently compliant with his breathing treatments.  Symptoms occurred while he was watching a football game.  He denies any recent fevers cough congestion.  He received 125 mg of Solu-Medrol  IV and 2 DuoNebs and route.  His wife is at bedside and contributes to the history     Physical Exam   Triage Vital Signs: ED Triage Vitals  Encounter Vitals Group     BP      Girls Systolic BP Percentile      Girls Diastolic BP Percentile      Boys Systolic BP Percentile      Boys Diastolic BP Percentile      Pulse      Resp      Temp      Temp src      SpO2      Weight      Height      Head Circumference      Peak Flow      Pain Score      Pain Loc      Pain Education      Exclude from Growth Chart     Most recent vital signs: Vitals:   05/01/24 0030 05/01/24 0100  BP: (!) 129/95 (!) 125/95  Pulse: 86 80  Resp: (!) 27 (!) 24  Temp:    SpO2: 95% 97%    Nursing Triage Note reviewed. Vital signs reviewed and patients oxygen saturation is hypoxic  General: Patient is well nourished, well developed, awake and alert, appears to be in distress Head: Normocephalic and atraumatic Eyes: Normal inspection, extraocular muscles intact, no conjunctival pallor Ear, nose, throat: Normal external exam Neck: Normal range of motion Respiratory: Patient is in respiratory distress, lungs rhonchi and mild wheezes, with accessory muscle use and belly breathing Cardiovascular: Patient is not  tachycardic, RR GI: Abd SNT with no guarding or rebound  Back: Normal inspection of the back with good strength and range of motion throughout all ext Extremities: pulses intact with good cap refills, no LE pitting edema or calf tenderness Neuro: The patient is alert and oriented to person, place, and time, appropriately conversive, with 5/5 bilat UE/LE strength, no gross motor or sensory defects noted. Coordination appears to be adequate. Skin: Warm, dry, and intact Psych: normal mood and affect, no SI or HI  ED Results / Procedures / Treatments   Labs (all labs ordered are listed, but only abnormal results are displayed) Labs Reviewed  CBC WITH DIFFERENTIAL/PLATELET - Abnormal; Notable for the following components:      Result Value   WBC 17.7 (*)    Neutro Abs 13.1 (*)    Monocytes Absolute 1.5 (*)    Abs Immature Granulocytes 0.08 (*)    All other components within normal limits  BASIC METABOLIC PANEL WITH GFR - Abnormal; Notable for the following components:   Glucose, Bld 123 (*)    All other components within normal limits  HEPATIC FUNCTION PANEL -  Abnormal; Notable for the following components:   Indirect Bilirubin 0.2 (*)    All other components within normal limits  PRO BRAIN NATRIURETIC PEPTIDE - Abnormal; Notable for the following components:   Pro Brain Natriuretic Peptide 311.0 (*)    All other components within normal limits  BLOOD GAS, VENOUS - Abnormal; Notable for the following components:   pCO2, Ven 61 (*)    pO2, Ven 53 (*)    Bicarbonate 32.2 (*)    Acid-Base Excess 4.5 (*)    All other components within normal limits  RESP PANEL BY RT-PCR (RSV, FLU A&B, COVID)  RVPGX2  CULTURE, BLOOD (ROUTINE X 2)  CULTURE, BLOOD (ROUTINE X 2)  LACTIC ACID, PLASMA  PROCALCITONIN  TROPONIN T, HIGH SENSITIVITY  TROPONIN T, HIGH SENSITIVITY     EKG EKG and rhythm strip are interpreted by myself:   EKG: [Normal sinus rhythm] at heart rate of 87, normal QRS duration,  QTc 483, nonspecific ST segments and T waves no ectopy EKG not consistent with Acute STEMI Rhythm strip: NSR in lead II   RADIOLOGY CXR: Consistent with pneumonia on my independent review interpretation radiologist agrees    PROCEDURES:  Critical Care performed: Yes, see critical care procedure note(s)  .Critical Care  Performed by: Nicholaus Rolland BRAVO, MD Authorized by: Nicholaus Rolland BRAVO, MD   Critical care provider statement:    Critical care time (minutes):  35   Critical care was necessary to treat or prevent imminent or life-threatening deterioration of the following conditions:  Respiratory failure   Critical care was time spent personally by me on the following activities:  Development of treatment plan with patient or surrogate, discussions with consultants, evaluation of patient's response to treatment, examination of patient, ordering and review of laboratory studies, ordering and review of radiographic studies, ordering and performing treatments and interventions, pulse oximetry, re-evaluation of patient's condition and review of old charts   Care discussed with: admitting provider   Comments:     Need for BiPAP and management of settings and frequent reassessments    MEDICATIONS ORDERED IN ED: Medications  guaiFENesin -dextromethorphan  (ROBITUSSIN DM) 100-10 MG/5ML syrup 10 mL (has no administration in time range)  ezetimibe  (ZETIA ) tablet 10 mg (has no administration in time range)  metoprolol  succinate (TOPROL -XL) 24 hr tablet 25 mg (has no administration in time range)  rosuvastatin  (CRESTOR ) tablet 10 mg (has no administration in time range)  torsemide  (DEMADEX ) tablet 10 mg (has no administration in time range)  ALPRAZolam  (XANAX ) tablet 0.5 mg (has no administration in time range)  levothyroxine  (SYNTHROID ) tablet 100 mcg (has no administration in time range)  pantoprazole  (PROTONIX ) EC tablet 40 mg (has no administration in time range)  tamsulosin  (FLOMAX ) capsule  0.4 mg (has no administration in time range)  apixaban  (ELIQUIS ) tablet 5 mg (5 mg Oral Given 05/01/24 0244)  acetaminophen  (TYLENOL ) tablet 650 mg (has no administration in time range)    Or  acetaminophen  (TYLENOL ) suppository 650 mg (has no administration in time range)  ondansetron  (ZOFRAN ) tablet 4 mg (has no administration in time range)    Or  ondansetron  (ZOFRAN ) injection 4 mg (has no administration in time range)  cefTRIAXone  (ROCEPHIN ) 2 g in sodium chloride  0.9 % 100 mL IVPB (has no administration in time range)  azithromycin  (ZITHROMAX ) 500 mg in sodium chloride  0.9 % 250 mL IVPB (has no administration in time range)  predniSONE  (DELTASONE ) tablet 40 mg (has no administration in time range)  ipratropium-albuterol  (DUONEB) 0.5-2.5 (3)  MG/3ML nebulizer solution 3 mL (3 mLs Nebulization Given 05/01/24 0246)  albuterol  (PROVENTIL ) (2.5 MG/3ML) 0.083% nebulizer solution 2.5 mg (has no administration in time range)  HYDROcodone -acetaminophen  (NORCO/VICODIN) 5-325 MG per tablet 1-2 tablet (has no administration in time range)  guaiFENesin  (MUCINEX ) 12 hr tablet 600 mg (600 mg Oral Given 05/01/24 0244)  cefTRIAXone  (ROCEPHIN ) 1 g in sodium chloride  0.9 % 100 mL IVPB (1 g Intravenous New Bag/Given 05/01/24 0247)  ondansetron  (ZOFRAN ) injection 4 mg (4 mg Intravenous Given 04/30/24 2354)  famotidine  (PEPCID ) IVPB 20 mg premix (0 mg Intravenous Stopped 05/01/24 0022)  LORazepam  (ATIVAN ) injection 0.25 mg (0.25 mg Intravenous Given 04/30/24 2354)  cefTRIAXone  (ROCEPHIN ) 1 g in sodium chloride  0.9 % 100 mL IVPB (0 g Intravenous Stopped 05/01/24 0240)  azithromycin  (ZITHROMAX ) 500 mg in sodium chloride  0.9 % 250 mL IVPB (500 mg Intravenous New Bag/Given 05/01/24 0152)     IMPRESSION / MDM / ASSESSMENT AND PLAN / ED COURSE                                Differential diagnosis includes, but is not limited to, COPD exacerbation, flash pulmonary edema, pneumonia, URI, influenza, electrolyte derangement,  anemia   ED course: Patient presents acutely and does appear to be in distress.  He just received 0.25 mg of Solu-Medrol  and 2 DuoNebs and route.  Given his increased work of breathing he was placed on BiPAP.  He did have a significant leukocytosis with no acute anemia and no profound electrolyte derangements.  He did not have an elevated lactic acidosis or procalcitonin.  VBG demonstrated only very mild hypercarbia without acidosis.  Troponin and BNP were not significantly elevated.  Chest x-ray was consistent with pneumonia and was given ceftriaxone  and azithromycin  for community-acquired pneumonia.  A COVID swab is pending.  Case discussed with hospitalist for admission    Clinical Course as of 05/01/24 0258  Fri Apr 30, 2024  2351 Temp(!): 100.8 F (38.2 C) Patient is febrile [HD]  Sat May 01, 2024  0044 WBC(!): 17.7 Does have a leukocytosis [HD]  0044 CBC with Differential(!) No acute anemia [HD]  0044 DG Chest Portable 1 View Consistent with pneumonia [HD]  0044 Lactic Acid, Venous: 1.6 Not super elevated [HD]  0049 No callback yet from [facility] despite reaching again we will reach out to [facility] as patient's husband in agreement [HD]    Clinical Course User Index [HD] Nicholaus Rolland BRAVO, MD   -- Risk: 5 This patient has a high risk of morbidity due to further diagnostic testing or treatment. Rationale: This patients evaluation and management involve a high risk of morbidity due to the potential severity of presenting symptoms, need for diagnostic testing, and/or initiation of treatment that may require close monitoring. The differential includes conditions with potential for significant deterioration or requiring escalation of care. Treatment decisions in the ED, including medication administration, procedural interventions, or disposition planning, reflect this level of risk. COPA: 5 The patient has the following acute or chronic illness/injury that poses a possible threat to life  or bodily function: [X] : The patient has a potentially serious acute condition or an acute exacerbation of a chronic illness requiring urgent evaluation and management in the Emergency Department. The clinical presentation necessitates immediate consideration of life-threatening or function-threatening diagnoses, even if they are ultimately ruled out.   FINAL CLINICAL IMPRESSION(S) / ED DIAGNOSES   Final diagnoses:  Acute on chronic  respiratory failure, unspecified whether with hypoxia or hypercapnia (HCC)  Pneumonia due to infectious organism, unspecified laterality, unspecified part of lung  Bronchospasm     Rx / DC Orders   ED Discharge Orders     None        Note:  This document was prepared using Dragon voice recognition software and may include unintentional dictation errors.   Nicholaus Rolland BRAVO, MD 05/01/24 [phone]  "

## 2024-05-01 ENCOUNTER — Encounter: Payer: Self-pay | Admitting: Internal Medicine

## 2024-05-01 ENCOUNTER — Other Ambulatory Visit: Payer: Self-pay

## 2024-05-01 DIAGNOSIS — Z79890 Hormone replacement therapy (postmenopausal): Secondary | ICD-10-CM | POA: Diagnosis not present

## 2024-05-01 DIAGNOSIS — J9801 Acute bronchospasm: Secondary | ICD-10-CM | POA: Diagnosis present

## 2024-05-01 DIAGNOSIS — I110 Hypertensive heart disease with heart failure: Secondary | ICD-10-CM | POA: Diagnosis present

## 2024-05-01 DIAGNOSIS — J9621 Acute and chronic respiratory failure with hypoxia: Secondary | ICD-10-CM

## 2024-05-01 DIAGNOSIS — I2510 Atherosclerotic heart disease of native coronary artery without angina pectoris: Secondary | ICD-10-CM | POA: Diagnosis present

## 2024-05-01 DIAGNOSIS — I5022 Chronic systolic (congestive) heart failure: Secondary | ICD-10-CM | POA: Diagnosis present

## 2024-05-01 DIAGNOSIS — J441 Chronic obstructive pulmonary disease with (acute) exacerbation: Secondary | ICD-10-CM | POA: Diagnosis present

## 2024-05-01 DIAGNOSIS — Z8551 Personal history of malignant neoplasm of bladder: Secondary | ICD-10-CM | POA: Diagnosis not present

## 2024-05-01 DIAGNOSIS — Z1152 Encounter for screening for COVID-19: Secondary | ICD-10-CM | POA: Diagnosis not present

## 2024-05-01 DIAGNOSIS — Z86711 Personal history of pulmonary embolism: Secondary | ICD-10-CM | POA: Diagnosis not present

## 2024-05-01 DIAGNOSIS — Z8249 Family history of ischemic heart disease and other diseases of the circulatory system: Secondary | ICD-10-CM | POA: Diagnosis not present

## 2024-05-01 DIAGNOSIS — G629 Polyneuropathy, unspecified: Secondary | ICD-10-CM | POA: Diagnosis present

## 2024-05-01 DIAGNOSIS — J189 Pneumonia, unspecified organism: Secondary | ICD-10-CM | POA: Diagnosis present

## 2024-05-01 DIAGNOSIS — Z923 Personal history of irradiation: Secondary | ICD-10-CM | POA: Diagnosis not present

## 2024-05-01 DIAGNOSIS — Z9981 Dependence on supplemental oxygen: Secondary | ICD-10-CM | POA: Diagnosis not present

## 2024-05-01 DIAGNOSIS — J440 Chronic obstructive pulmonary disease with acute lower respiratory infection: Secondary | ICD-10-CM | POA: Diagnosis present

## 2024-05-01 DIAGNOSIS — Z9221 Personal history of antineoplastic chemotherapy: Secondary | ICD-10-CM | POA: Diagnosis not present

## 2024-05-01 DIAGNOSIS — I255 Ischemic cardiomyopathy: Secondary | ICD-10-CM | POA: Diagnosis present

## 2024-05-01 DIAGNOSIS — J9622 Acute and chronic respiratory failure with hypercapnia: Secondary | ICD-10-CM | POA: Diagnosis present

## 2024-05-01 DIAGNOSIS — E039 Hypothyroidism, unspecified: Secondary | ICD-10-CM | POA: Diagnosis present

## 2024-05-01 DIAGNOSIS — E785 Hyperlipidemia, unspecified: Secondary | ICD-10-CM | POA: Diagnosis present

## 2024-05-01 DIAGNOSIS — N400 Enlarged prostate without lower urinary tract symptoms: Secondary | ICD-10-CM | POA: Diagnosis present

## 2024-05-01 DIAGNOSIS — Z7901 Long term (current) use of anticoagulants: Secondary | ICD-10-CM | POA: Diagnosis not present

## 2024-05-01 DIAGNOSIS — I480 Paroxysmal atrial fibrillation: Secondary | ICD-10-CM | POA: Diagnosis present

## 2024-05-01 DIAGNOSIS — Z87891 Personal history of nicotine dependence: Secondary | ICD-10-CM | POA: Diagnosis not present

## 2024-05-01 LAB — CBC WITH DIFFERENTIAL/PLATELET
Abs Immature Granulocytes: 0.08 K/uL — ABNORMAL HIGH (ref 0.00–0.07)
Basophils Absolute: 0 K/uL (ref 0.0–0.1)
Basophils Relative: 0 %
Eosinophils Absolute: 0.2 K/uL (ref 0.0–0.5)
Eosinophils Relative: 1 %
HCT: 46.3 % (ref 39.0–52.0)
Hemoglobin: 14.4 g/dL (ref 13.0–17.0)
Immature Granulocytes: 1 %
Lymphocytes Relative: 16 %
Lymphs Abs: 2.9 K/uL (ref 0.7–4.0)
MCH: 28 pg (ref 26.0–34.0)
MCHC: 31.1 g/dL (ref 30.0–36.0)
MCV: 89.9 fL (ref 80.0–100.0)
Monocytes Absolute: 1.5 K/uL — ABNORMAL HIGH (ref 0.1–1.0)
Monocytes Relative: 9 %
Neutro Abs: 13.1 K/uL — ABNORMAL HIGH (ref 1.7–7.7)
Neutrophils Relative %: 73 %
Platelets: 212 K/uL (ref 150–400)
RBC: 5.15 MIL/uL (ref 4.22–5.81)
RDW: 13.2 % (ref 11.5–15.5)
WBC: 17.7 K/uL — ABNORMAL HIGH (ref 4.0–10.5)
nRBC: 0 % (ref 0.0–0.2)

## 2024-05-01 LAB — TROPONIN T, HIGH SENSITIVITY
Troponin T High Sensitivity: 137 ng/L (ref 0–19)
Troponin T High Sensitivity: 138 ng/L (ref 0–19)
Troponin T High Sensitivity: 19 ng/L (ref 0–19)

## 2024-05-01 LAB — BLOOD GAS, VENOUS
Acid-Base Excess: 4.5 mmol/L — ABNORMAL HIGH (ref 0.0–2.0)
Bicarbonate: 32.2 mmol/L — ABNORMAL HIGH (ref 20.0–28.0)
O2 Saturation: 82.3 %
Patient temperature: 37
pCO2, Ven: 61 mmHg — ABNORMAL HIGH (ref 44–60)
pH, Ven: 7.33 (ref 7.25–7.43)
pO2, Ven: 53 mmHg — ABNORMAL HIGH (ref 32–45)

## 2024-05-01 LAB — HEPATIC FUNCTION PANEL
ALT: 12 U/L (ref 0–44)
AST: 19 U/L (ref 15–41)
Albumin: 4.2 g/dL (ref 3.5–5.0)
Alkaline Phosphatase: 59 U/L (ref 38–126)
Bilirubin, Direct: 0.2 mg/dL (ref 0.0–0.2)
Indirect Bilirubin: 0.2 mg/dL — ABNORMAL LOW (ref 0.3–0.9)
Total Bilirubin: 0.4 mg/dL (ref 0.0–1.2)
Total Protein: 6.8 g/dL (ref 6.5–8.1)

## 2024-05-01 LAB — BASIC METABOLIC PANEL WITH GFR
Anion gap: 9 (ref 5–15)
BUN: 14 mg/dL (ref 8–23)
CO2: 28 mmol/L (ref 22–32)
Calcium: 9.3 mg/dL (ref 8.9–10.3)
Chloride: 106 mmol/L (ref 98–111)
Creatinine, Ser: 0.76 mg/dL (ref 0.61–1.24)
GFR, Estimated: >60 mL/min
Glucose, Bld: 123 mg/dL — ABNORMAL HIGH (ref 70–99)
Potassium: 4.5 mmol/L (ref 3.5–5.1)
Sodium: 143 mmol/L (ref 135–145)

## 2024-05-01 LAB — PROCALCITONIN: Procalcitonin: <0.1 ng/mL

## 2024-05-01 LAB — EXPECTORATED SPUTUM ASSESSMENT W GRAM STAIN, RFLX TO RESP C

## 2024-05-01 LAB — PRO BRAIN NATRIURETIC PEPTIDE: Pro Brain Natriuretic Peptide: 311 pg/mL — ABNORMAL HIGH

## 2024-05-01 LAB — RESP PANEL BY RT-PCR (RSV, FLU A&B, COVID)  RVPGX2
Influenza A by PCR: NEGATIVE
Influenza B by PCR: NEGATIVE
Resp Syncytial Virus by PCR: NEGATIVE
SARS Coronavirus 2 by RT PCR: NEGATIVE

## 2024-05-01 LAB — LACTIC ACID, PLASMA: Lactic Acid, Venous: 1.6 mmol/L (ref 0.5–1.9)

## 2024-05-01 MED ORDER — LEVOTHYROXINE SODIUM 100 MCG PO TABS
100.0000 ug | ORAL_TABLET | Freq: Every day | ORAL | Status: DC
  Administered 2024-05-01 – 2024-05-03 (×3): 100 ug via ORAL
  Filled 2024-05-01: qty 2
  Filled 2024-05-01 (×2): qty 1

## 2024-05-01 MED ORDER — IPRATROPIUM-ALBUTEROL 0.5-2.5 (3) MG/3ML IN SOLN
3.0000 mL | Freq: Four times a day (QID) | RESPIRATORY_TRACT | Status: DC
  Administered 2024-05-01 (×4): 3 mL via RESPIRATORY_TRACT
  Filled 2024-05-01 (×4): qty 3

## 2024-05-01 MED ORDER — METHYLPREDNISOLONE SODIUM SUCC 40 MG IJ SOLR
40.0000 mg | Freq: Two times a day (BID) | INTRAMUSCULAR | Status: AC
  Administered 2024-05-01 (×2): 40 mg via INTRAVENOUS
  Filled 2024-05-01 (×2): qty 1

## 2024-05-01 MED ORDER — SODIUM CHLORIDE 0.9 % IV SOLN
2.0000 g | INTRAVENOUS | Status: DC
  Administered 2024-05-02 – 2024-05-03 (×2): 2 g via INTRAVENOUS
  Filled 2024-05-01 (×2): qty 20

## 2024-05-01 MED ORDER — GUAIFENESIN-DM 100-10 MG/5ML PO SYRP: 10.0000 mL | ORAL_SOLUTION | ORAL | Status: DC | PRN

## 2024-05-01 MED ORDER — IPRATROPIUM-ALBUTEROL 0.5-2.5 (3) MG/3ML IN SOLN
3.0000 mL | Freq: Three times a day (TID) | RESPIRATORY_TRACT | Status: DC
  Administered 2024-05-02 – 2024-05-03 (×4): 3 mL via RESPIRATORY_TRACT
  Filled 2024-05-01 (×4): qty 3

## 2024-05-01 MED ORDER — ROSUVASTATIN CALCIUM 10 MG PO TABS
10.0000 mg | ORAL_TABLET | Freq: Every day | ORAL | Status: DC
  Administered 2024-05-01 – 2024-05-02 (×2): 10 mg via ORAL
  Filled 2024-05-01 (×3): qty 1

## 2024-05-01 MED ORDER — ORAL CARE MOUTH RINSE: 15.0000 mL | OROMUCOSAL | Status: DC | PRN

## 2024-05-01 MED ORDER — BUDESONIDE 0.25 MG/2ML IN SUSP
0.2500 mg | Freq: Two times a day (BID) | RESPIRATORY_TRACT | Status: DC
  Administered 2024-05-01 – 2024-05-03 (×5): 0.25 mg via RESPIRATORY_TRACT
  Filled 2024-05-01 (×5): qty 2

## 2024-05-01 MED ORDER — ORAL CARE MOUTH RINSE
15.0000 mL | OROMUCOSAL | Status: DC
  Administered 2024-05-02 – 2024-05-03 (×4): 15 mL via OROMUCOSAL

## 2024-05-01 MED ORDER — ACETAMINOPHEN 650 MG RE SUPP: 650.0000 mg | Freq: Four times a day (QID) | RECTAL | Status: DC | PRN

## 2024-05-01 MED ORDER — SODIUM CHLORIDE 0.9 % IV SOLN
1.0000 g | Freq: Once | INTRAVENOUS | Status: AC
  Administered 2024-05-01: 1 g via INTRAVENOUS
  Filled 2024-05-01: qty 10

## 2024-05-01 MED ORDER — GABAPENTIN 100 MG PO CAPS
100.0000 mg | ORAL_CAPSULE | Freq: Every day | ORAL | Status: DC
  Administered 2024-05-01 – 2024-05-03 (×3): 100 mg via ORAL
  Filled 2024-05-01 (×3): qty 1

## 2024-05-01 MED ORDER — ALPRAZOLAM 0.5 MG PO TABS
0.5000 mg | ORAL_TABLET | Freq: Two times a day (BID) | ORAL | Status: DC | PRN
  Administered 2024-05-02 – 2024-05-03 (×2): 0.5 mg via ORAL
  Filled 2024-05-01 (×2): qty 1

## 2024-05-01 MED ORDER — METOPROLOL SUCCINATE ER 25 MG PO TB24
25.0000 mg | ORAL_TABLET | Freq: Every day | ORAL | Status: DC
  Administered 2024-05-01 – 2024-05-03 (×3): 25 mg via ORAL
  Filled 2024-05-01 (×3): qty 1

## 2024-05-01 MED ORDER — ONDANSETRON HCL 4 MG PO TABS: 4.0000 mg | ORAL_TABLET | Freq: Four times a day (QID) | ORAL | Status: DC | PRN

## 2024-05-01 MED ORDER — TORSEMIDE 20 MG PO TABS
10.0000 mg | ORAL_TABLET | Freq: Every day | ORAL | Status: DC
  Administered 2024-05-01 – 2024-05-03 (×3): 10 mg via ORAL
  Filled 2024-05-01 (×3): qty 1

## 2024-05-01 MED ORDER — ALBUTEROL SULFATE (2.5 MG/3ML) 0.083% IN NEBU: 2.5000 mg | INHALATION_SOLUTION | RESPIRATORY_TRACT | Status: DC | PRN

## 2024-05-01 MED ORDER — SODIUM CHLORIDE 0.9 % IV SOLN
500.0000 mg | INTRAVENOUS | Status: DC
  Administered 2024-05-02: 500 mg via INTRAVENOUS
  Filled 2024-05-01: qty 5

## 2024-05-01 MED ORDER — HYDROCODONE-ACETAMINOPHEN 5-325 MG PO TABS
1.0000 | ORAL_TABLET | ORAL | Status: DC | PRN
  Administered 2024-05-03: 1 via ORAL
  Filled 2024-05-01: qty 1

## 2024-05-01 MED ORDER — TAMSULOSIN HCL 0.4 MG PO CAPS
0.4000 mg | ORAL_CAPSULE | Freq: Every day | ORAL | Status: DC
  Administered 2024-05-01 – 2024-05-03 (×3): 0.4 mg via ORAL
  Filled 2024-05-01 (×3): qty 1

## 2024-05-01 MED ORDER — SODIUM CHLORIDE 0.9 % IV SOLN
500.0000 mg | Freq: Once | INTRAVENOUS | Status: AC
  Administered 2024-05-01: 500 mg via INTRAVENOUS
  Filled 2024-05-01: qty 5

## 2024-05-01 MED ORDER — PREDNISONE 20 MG PO TABS
40.0000 mg | ORAL_TABLET | Freq: Every day | ORAL | Status: DC
  Filled 2024-05-01: qty 2

## 2024-05-01 MED ORDER — ONDANSETRON HCL 4 MG/2ML IJ SOLN: 4.0000 mg | Freq: Four times a day (QID) | INTRAMUSCULAR | Status: DC | PRN

## 2024-05-01 MED ORDER — ACETAMINOPHEN 325 MG PO TABS: 650.0000 mg | ORAL_TABLET | Freq: Four times a day (QID) | ORAL | Status: DC | PRN

## 2024-05-01 MED ORDER — PANTOPRAZOLE SODIUM 40 MG PO TBEC
40.0000 mg | DELAYED_RELEASE_TABLET | Freq: Every day | ORAL | Status: DC
  Administered 2024-05-01 – 2024-05-03 (×3): 40 mg via ORAL
  Filled 2024-05-01 (×3): qty 1

## 2024-05-01 MED ORDER — EZETIMIBE 10 MG PO TABS
10.0000 mg | ORAL_TABLET | Freq: Every day | ORAL | Status: DC
  Administered 2024-05-01 – 2024-05-03 (×3): 10 mg via ORAL
  Filled 2024-05-01 (×3): qty 1

## 2024-05-01 MED ORDER — MONTELUKAST SODIUM 10 MG PO TABS
10.0000 mg | ORAL_TABLET | Freq: Every day | ORAL | Status: DC
  Administered 2024-05-01 – 2024-05-03 (×3): 10 mg via ORAL
  Filled 2024-05-01 (×3): qty 1

## 2024-05-01 MED ORDER — APIXABAN 5 MG PO TABS
5.0000 mg | ORAL_TABLET | Freq: Two times a day (BID) | ORAL | Status: DC
  Administered 2024-05-01 – 2024-05-03 (×6): 5 mg via ORAL
  Filled 2024-05-01 (×6): qty 1

## 2024-05-01 MED ORDER — GUAIFENESIN ER 600 MG PO TB12
600.0000 mg | ORAL_TABLET | Freq: Two times a day (BID) | ORAL | Status: DC
  Administered 2024-05-01 – 2024-05-03 (×6): 600 mg via ORAL
  Filled 2024-05-01 (×6): qty 1

## 2024-05-01 NOTE — ED Notes (Signed)
 Pt trialed off bipap to test PO status with meds. Pt able to take meds but became very SOB with increased WOB during med pass. It is this RN's recommendation that pt remain NPO or limited time off bipap until respiratory status is more stable.

## 2024-05-01 NOTE — Assessment & Plan Note (Signed)
"  Continue levothyroxine          "

## 2024-05-01 NOTE — Assessment & Plan Note (Signed)
 No complaints of chest pain, EKG without ischemia Continue apixaban , metoprolol  and rosuvastatin 

## 2024-05-01 NOTE — Assessment & Plan Note (Signed)
Continue apixaban and metoprolol 

## 2024-05-01 NOTE — ED Notes (Addendum)
 Pt reporting difficulty breathing at this time. Increased work of breathing noted. Pt placed on 4L . O2 sat 97% with labored breathing. RT called to bedside to place pt back on bipap. MD made aware.

## 2024-05-01 NOTE — ED Notes (Signed)
RT at bedside to trial off bipap.

## 2024-05-01 NOTE — H&P (Signed)
 " History and Physical    Patient: Benjamin Torres FMW:[id number] DOB: 1956-01-05 DOA: 04/30/2024 DOS: the patient was seen and examined on 05/01/2024 PCP: Benjamin Torres FALCON, MD  Patient coming from: Home  Chief Complaint:  Chief Complaint  Patient presents with   Shortness of Breath    HPI: Benjamin Torres is a 69 y.o. 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  He received DuoNebs and Solu-Medrol  en route. In the ED, BP 183/120, febrile to 100.8, pulse 97 respiration in the mid 20s with O2 sat in the high 90s.  Due to increased work of breathing he was placed on BiPAP.VBG on BiPAP with pH 7.33 and pCO2 61 TNT 19 and proBNP 311 Other labs notable for WBC 17,000 with normal lactic acid at 1.6 and negative respiratory viral panel.  BMP unremarkable EKG showed sinus at 87 no ischemic findings Chest x-ray showed possible left lower lobe pneumonia. 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  Has, does NOT wear   Wears  dentures    Past Surgical History:  Procedure Laterality Date   ANAL FISSURE REPAIR     APPENDECTOMY     BIOPSY PHARYNX  2015   Larynx BX   CATARACT EXTRACTION W/PHACO Right 09/03/2023   Procedure: PHACOEMULSIFICATION, CATARACT, WITH IOL INSERTION 4.97 00:30.5;  Surgeon: Mittie Gaskin, MD;  Location: [facility];  Service: Ophthalmology;  Laterality: Right;   COLONOSCOPY WITH PROPOFOL  N/A 03/07/2017   Procedure: COLONOSCOPY WITH PROPOFOL ;  Surgeon: Jinny Carmine, MD;  Location: [facility];  Service: Endoscopy;  Laterality: N/A;   ESOPHAGOGASTRODUODENOSCOPY (EGD) WITH PROPOFOL  N/A 03/07/2017   Procedure: ESOPHAGOGASTRODUODENOSCOPY (EGD) WITH PROPOFOL ;  Surgeon: Jinny Carmine, MD;  Location: [facility];  Service: Endoscopy;  Laterality: N/A;   LEFT HEART CATH AND CORONARY ANGIOGRAPHY Left 09/23/2019   Procedure: LEFT HEART CATH AND CORONARY ANGIOGRAPHY;  Surgeon: Bosie Vinie LABOR, MD;  Location: [facility];  Service: Cardiovascular;  Laterality: Left;   POLYPECTOMY  03/07/2017   Procedure: POLYPECTOMY;  Surgeon: Jinny Carmine, MD;  Location: [facility];  Service: Endoscopy;;   Social History:  reports that he quit smoking about 13 months ago. His smoking use included e-cigarettes and cigarettes. He started smoking about 51 years ago. He has a 74.9 pack-year smoking history. He has never used smokeless tobacco. He reports that he does not drink alcohol and does not use drugs.  Allergies[1]  Family History  Problem Relation Age of  Onset   CAD Mother    Cancer - Lung Father     Prior to Admission medications  Medication Sig Start Date End Date Taking? Authorizing Provider  acetaminophen  (TYLENOL ) 325 MG tablet Take 2 tablets (650 mg total) by mouth every 6 (six) hours as needed for mild pain (pain score 1-3). 05/16/23   Jens Durand, MD  albuterol  (PROVENTIL  HFA;VENTOLIN  HFA) 108 (90 Base) MCG/ACT inhaler Inhale 1 puff into the lungs every 4  (four) hours as needed for wheezing or shortness of breath.    [provider]  albuterol  (PROVENTIL ) (2.5 MG/3ML) 0.083% nebulizer solution Take 3 mLs (2.5 mg total) by nebulization every 6 (six) hours as needed for wheezing or shortness of breath. 05/16/23   Jens Durand, MD  ALPRAZolam  (XANAX ) 0.5 MG tablet Take 0.5 mg by mouth 2 (two) times daily.    [provider]  apixaban  (ELIQUIS ) 5 MG TABS tablet Take 1 tablet (5 mg total) by mouth 2 (two) times daily. 05/16/23   Jens Durand, MD  Budeson-Glycopyrrol-Formoterol  (BREZTRI  AEROSPHERE) 160-9-4.8 MCG/ACT AERO Inhale 2 puffs into the lungs in the morning and at bedtime. 02/03/23   Isadora Hose, MD  cyanocobalamin  (,VITAMIN B-12,) 1000 MCG/ML injection Inject 1,000 mcg into the muscle every 30 (thirty) days. 10/23/21   [provider]  ezetimibe  (ZETIA ) 10 MG tablet Take 1 tablet (10 mg total) by mouth daily. 02/10/23 03/16/24  Gollan, Timothy J, MD  fluconazole (DIFLUCAN) 200 MG tablet Take 200 mg by mouth daily. Patient not taking: Reported on 03/16/2024 02/04/24   [provider]  gabapentin  (NEURONTIN ) 100 MG capsule Take 100 mg by mouth daily. 05/27/22   [provider]  Iron -Vitamin C (VITRON-C) 65-125 MG TABS Take by mouth.    [provider]  levothyroxine  (SYNTHROID ) 100 MCG tablet Take 100 mcg by mouth daily before breakfast.    [provider]  metoprolol  succinate (TOPROL -XL) 50 MG 24 hr tablet Take 0.5 tablets (25 mg total) by mouth daily. 02/15/24   Alexander, Natalie, DO  montelukast  (SINGULAIR ) 10 MG tablet Take 10 mg by mouth daily. 05/07/22   [provider]  nicotine  (NICODERM CQ  - DOSED IN MG/24 HOURS) 21 mg/24hr patch Place 1 patch (21 mg total) onto the skin daily. Patient not taking: Reported on 03/16/2024 05/17/23   Jens Durand, MD  OXYGEN Inhale into the lungs. At night and with exertion    [provider]  pantoprazole  (PROTONIX ) 40 MG  tablet Take 40 mg by mouth. 02/18/24 02/17/25  [provider]  rosuvastatin  (CRESTOR ) 10 MG tablet Take 10 mg by mouth at bedtime. 07/12/20   [provider]  tamsulosin  (FLOMAX ) 0.4 MG CAPS capsule Take 1 capsule (0.4 mg total) by mouth daily. 09/22/18   Francisca Redell BROCKS, MD  torsemide  (DEMADEX ) 10 MG tablet Take 10 mg by mouth. 03/03/24 03/03/25  [provider]    Physical Exam: Vitals:   04/30/24 2356 05/01/24 0000 05/01/24 0030 05/01/24 0100  BP:  (!) 137/98 (!) 129/95 (!) 125/95  Pulse: 88 91 86 80  Resp: (!) 26 (!) 26 (!) 27 (!) 24  Temp: 99 F (37.2 C)     TempSrc: Oral     SpO2: 96% 96% 95% 97%  Weight:      Height:       Physical Exam Vitals and nursing note reviewed.  Constitutional:      General: He is not in acute distress.    Comments: Patient with BiPAP mask  on  HENT:     Head: Normocephalic and atraumatic.  Cardiovascular:     Rate and Rhythm: Normal rate and regular rhythm.     Heart sounds: Normal heart sounds.  Pulmonary:     Effort: Pulmonary effort is normal. Tachypnea present.     Breath sounds: Decreased air movement present. Wheezing present.  Abdominal:     Palpations: Abdomen is soft.     Tenderness: There is no abdominal tenderness.  Neurological:     Mental Status: Mental status is at baseline.     Labs on Admission: I have personally reviewed following labs and imaging studies  CBC: Recent Labs  Lab 04/30/24 2336  WBC 17.7*  NEUTROABS 13.1*  HGB 14.4  HCT 46.3  MCV 89.9  PLT 212   Basic Metabolic Panel: Recent Labs  Lab 04/30/24 2336  NA 143  K 4.5  CL 106  CO2 28  GLUCOSE 123*  BUN 14  CREATININE 0.76  CALCIUM  9.3   GFR: Estimated Creatinine Clearance: 86.3 mL/min (by C-G formula based on SCr of 0.76 mg/dL). Liver Function Tests: Recent Labs  Lab 04/30/24 2336  AST 19  ALT 12  ALKPHOS 59  BILITOT 0.4  PROT 6.8  ALBUMIN 4.2   No results for input(s): LIPASE, AMYLASE in the last 168  hours. No results for input(s): AMMONIA in the last 168 hours. Coagulation Profile: No results for input(s): INR, PROTIME in the last 168 hours. Cardiac Enzymes: No results for input(s): CKTOTAL, CKMB, CKMBINDEX, TROPONINI in the last 168 hours. BNP (last 3 results) Recent Labs    04/30/24 2336  PROBNP 311.0*   HbA1C: No results for input(s): HGBA1C in the last 72 hours. CBG: No results for input(s): GLUCAP in the last 168 hours. Lipid Profile: No results for input(s): CHOL, HDL, LDLCALC, TRIG, CHOLHDL, LDLDIRECT in the last 72 hours. Thyroid  Function Tests: No results for input(s): TSH, T4TOTAL, FREET4, T3FREE, THYROIDAB in the last 72 hours. Anemia Panel: No results for input(s): VITAMINB12, FOLATE, FERRITIN, TIBC, IRON , RETICCTPCT in the last 72 hours. Urine analysis:    Component Value Date/Time   COLORURINE YELLOW (A) 02/13/2024 0556   APPEARANCEUR CLEAR (A) 02/13/2024 0556   APPEARANCEUR Clear 03/06/2017 1414   LABSPEC 1.017 02/13/2024 0556   PHURINE 6.0 02/13/2024 0556   GLUCOSEU NEGATIVE 02/13/2024 0556   HGBUR NEGATIVE 02/13/2024 0556   BILIRUBINUR NEGATIVE 02/13/2024 0556   BILIRUBINUR Negative 03/06/2017 1414   KETONESUR NEGATIVE 02/13/2024 0556   PROTEINUR NEGATIVE 02/13/2024 0556   NITRITE NEGATIVE 02/13/2024 0556   LEUKOCYTESUR NEGATIVE 02/13/2024 0556    Radiological Exams on Admission: DG Chest Portable 1 View Result Date: 05/01/2024 EXAM: 1 VIEW(S) XRAY OF THE CHEST 05/01/2024 12:04:07 AM COMPARISON: Comparison with 02/13/2024. CLINICAL HISTORY: Respiratory distress. FINDINGS: LUNGS AND PLEURA: New airspace infiltration in the left lung base likely representing pneumonia. Aspiration would also be a possibility in the appropriate clinical setting. Emphysematous changes are suggested in the lungs. Pulmonary vascularity is normal. No pleural effusion. No pneumothorax. HEART AND MEDIASTINUM: Heart size is  normal. No acute abnormality of the mediastinal silhouette. BONES AND SOFT TISSUES: No acute osseous abnormality. IMPRESSION: 1. New airspace infiltration in the left lung base likely representing pneumonia. Aspiration is also a possibility in the appropriate clinical setting. 2. Emphysematous changes suggested in the lungs. Electronically signed by: Elsie Gravely MD 05/01/2024 12:21 AM EST RP Workstation: [id number]   Data Reviewed for HPI: Relevant notes from primary care and specialist visits, past discharge summaries  as available in EHR, including Care Everywhere. Prior diagnostic testing as pertinent to current admission diagnoses Updated medications and problem lists for reconciliation ED course, including vitals, labs, imaging, treatment and response to treatment Triage notes, nursing and pharmacy notes and ED provider's notes Notable results as noted above in HPI      Assessment and Plan: CAP (community acquired pneumonia) COPD exacerbation Acute on chronic respiratory failure with hypoxia and hypercapnia Patient presents in acute respiratory distress, placed on BiPAP for increased work of breathing, pCO2 on BiPAP 61 Rocephin  and azithromycin  Prednisone  40 mg daily Continue BiPAP and wean as tolerated  Chronic systolic CHF (congestive heart failure) (HCC) Clinically euvolemic Last known EF 45 to 50%, BNP 311 Continue home GDMT of metoprolol , torsemide   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 Observation status is judged to be reasonable and necessary in order to provide the required intensity of service to ensure the patient's safety. The patient's presenting symptoms, physical exam findings, and initial radiographic and laboratory data in the context of their medical condition is felt to place them at decreased risk for further clinical deterioration. Furthermore, it is anticipated that the patient will be medically stable for discharge from the hospital within 2 midnights of admission.   Author: Delayne LULLA Solian, MD 05/01/2024 2:06 AM  For on call review [email or web address].      [1] No Known Allergies  "

## 2024-05-01 NOTE — ED Notes (Signed)
 NURSE JESSICA RN INFORMED OF BED ASSIGNED

## 2024-05-01 NOTE — Progress Notes (Signed)
 " PROGRESS NOTE    Benjamin Torres  FMW:[id number] DOB: 1956-03-26 DOA: 04/30/2024 PCP: Cleotilde Oneil FALCON, MD  Outpatient Specialists: cardiology, pulmonology, oncology    Brief Narrative:    Benjamin Torres is a 69 y.o. 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  He received DuoNebs and Solu-Medrol  en route. In the ED, BP 183/120, febrile to 100.8, pulse 97 respiration in the mid 20s with O2 sat in the high 90s.  Due to increased work of breathing he was placed on BiPAP.VBG on BiPAP with pH 7.33 and pCO2 61 TNT 19 and proBNP 311 Other labs notable for WBC 17,000 with normal lactic acid at 1.6 and negative respiratory viral panel.  BMP unremarkable EKG showed sinus at 87 no ischemic findings Chest x-ray showed possible left lower lobe pneumonia. Patient started on Rocephin  and azithromycin    Assessment & Plan:   Principal Problem:   COPD with acute exacerbation (HCC) Active Problems:   CAP (community acquired pneumonia)   Acute on chronic respiratory failure with hypoxia and hypercapnia (HCC)   Chronic systolic CHF (congestive heart failure) (HCC)   Cancer of larynx (HCC)   CAD (coronary artery disease)   Paroxysmal atrial fibrillation (HCC)   Acquired hypothyroidism   HTN (hypertension)   Smoking history   Chronic respiratory failure with hypoxia (HCC)   History of pulmonary embolus (PE)  # COPD with acute exacerbation # CAP Pulm has described this as end-stage copd (follows w/ dr. Theotis). Here with one day cough, wheeze, and dyspnea. CXR with LLL infiltrate. Covid/flu/rsv neg. Unlikely PE as  reports compliance with home apixaban . Bnp not elevated for age and does not appear fluid overloaded. Has not yet received steroids - continue ceftriaxone /azithromycin  - f/u rvp - sputum for culture - add methylpred 40 bid today, if improving tomorrow will convert to oral - continue duonebs, add pulmicort  - cont home singulair   # Acute on chronic hypoxic hypercarbic respiratory failure On 2 liters at baseline. Chronic hypercarbia. Follows with pulm, not on bipap as outpt - continue bipap for now, wean as able  # HFrecoveredEF # Ischemic cardiomyopathy Most recent EF 55. Does not appear decompensated. - home metop, torsemide   # CAD Med mgmt advised after most recent cath. No chest pain and trop wnl. - home crestor , apixaban   # Neuropathy - home gabapentin   # GAD - home xanax   # A-fib, paroxysmal In sinus - cont home apixaban , metop  # History PE - cont home apixaban   # History laryngeal cancer S/p chemo/rad, follows with onc - outpt f/u  # Pulm nodules Waxing and waning. On surveillance by oncology - outpt f/u  # Hypothyroid - home synthroid   # BPH - home flomax   # Advanced care planning Reviewed health history, end-stage copd, prognosis if patient were to code, discussed concept of DNR. Patient clear in his desire to remain full code and full scope. Aware odds of reasonable recover were he to code would be slim. 10 min.  # Debility - PT consult tomorrow  DVT prophylaxis: n/a (on therapeutic apixaban ) Code Status: full, confirmed with patient Family Communication: none at bedside. Wife called, no  answer, no voicemail. Sister joyce updated telephonically 1/3.   Level of care: Progressive Status is: Observation   Consultants:  none  Procedures: none  Antimicrobials:  See above    Subjective: Reports stable dyspnea  Objective: Vitals:   05/01/24 0530 05/01/24 0600 05/01/24 0630 05/01/24 0715  BP: 120/87 121/89 (!) 127/95   Pulse: 66 65 70 65   Resp: 20 19 17  (!) 21  Temp:      TempSrc:      SpO2: 98% 97% 97% 98%  Weight:      Height:       No intake or output data in the 24 hours ending 05/01/24 0838 Filed Weights   04/30/24 2336  Weight: 79.4 kg    Examination:  General exam: Appears calm and comfortable. Chronically ill Respiratory system: decreased air entry. Exp wheeze b/l Cardiovascular system: S1 & S2 heard, RRR.   Gastrointestinal system: Abdomen is nondistended, soft and nontender.  Central nervous system: Alert and oriented. No focal neurological deficits. Extremities: Symmetric 5 x 5 power. Skin: No rashes, lesions or ulcers Psychiatry: Judgement and insight appear normal. Mood & affect appropriate.     Data Reviewed: I have personally reviewed following labs and imaging studies  CBC: Recent Labs  Lab 04/30/24 2336  WBC 17.7*  NEUTROABS 13.1*  HGB 14.4  HCT 46.3  MCV 89.9  PLT 212   Basic Metabolic Panel: Recent Labs  Lab 04/30/24 2336  NA 143  K 4.5  CL 106  CO2 28  GLUCOSE 123*  BUN 14  CREATININE 0.76  CALCIUM  9.3   GFR: Estimated Creatinine Clearance: 86.3 mL/min (by C-G formula based on SCr of 0.76 mg/dL). Liver Function Tests: Recent Labs  Lab 04/30/24 2336  AST 19  ALT 12  ALKPHOS 59  BILITOT 0.4  PROT 6.8  ALBUMIN 4.2   No results for input(s): LIPASE, AMYLASE in the last 168 hours. No results for input(s): AMMONIA in the last 168 hours. Coagulation Profile: No results for input(s): INR, PROTIME in the last 168 hours. Cardiac Enzymes: No results for input(s): CKTOTAL, CKMB, CKMBINDEX, TROPONINI in the last 168 hours. BNP (last 3 results) Recent Labs    04/30/24 2336  PROBNP 311.0*   HbA1C: No results for input(s): HGBA1C in the last 72 hours. CBG: No results for input(s): GLUCAP in the last 168 hours. Lipid Profile: No results for input(s): CHOL, HDL, LDLCALC, TRIG, CHOLHDL, LDLDIRECT in the last 72 hours. Thyroid   Function Tests: No results for input(s): TSH, T4TOTAL, FREET4, T3FREE, THYROIDAB in the last 72 hours. Anemia Panel: No results for input(s): VITAMINB12, FOLATE, FERRITIN, TIBC, IRON , RETICCTPCT in the last 72 hours. Urine analysis:    Component Value Date/Time   COLORURINE YELLOW (A) 02/13/2024 0556   APPEARANCEUR CLEAR (A) 02/13/2024 0556   APPEARANCEUR Clear 03/06/2017 1414   LABSPEC 1.017 02/13/2024 0556   PHURINE 6.0 02/13/2024 0556   GLUCOSEU NEGATIVE 02/13/2024 0556   HGBUR NEGATIVE 02/13/2024 0556   BILIRUBINUR NEGATIVE 02/13/2024 0556   BILIRUBINUR Negative 03/06/2017 1414   KETONESUR NEGATIVE 02/13/2024 0556   PROTEINUR NEGATIVE 02/13/2024 0556   NITRITE NEGATIVE 02/13/2024 0556   LEUKOCYTESUR NEGATIVE 02/13/2024 0556   Sepsis Labs: @LABRCNTIP (procalcitonin:4,lacticidven:4)  ) Recent Results (from the past 240 hours)  Resp panel by RT-PCR (RSV, Flu A&B, Covid) Anterior Nasal Swab     Status: None   Collection Time: 04/30/24 11:37 PM   Specimen: Anterior Nasal Swab  Result Value Ref Range Status   SARS Coronavirus  2 by RT PCR NEGATIVE NEGATIVE Final    Comment: (NOTE) SARS-CoV-2 target nucleic acids are NOT DETECTED.  The SARS-CoV-2 RNA is generally detectable in upper respiratory specimens during the acute phase of infection. The lowest concentration of SARS-CoV-2 viral copies this assay can detect is 138 copies/mL. A negative result does not preclude SARS-Cov-2 infection and should not be used as the sole basis for treatment or other patient management decisions. A negative result may occur with  improper specimen collection/handling, submission of specimen other than nasopharyngeal swab, presence of viral mutation(s) within the areas targeted by this assay, and inadequate number of viral copies(<138 copies/mL). A negative result must be combined with clinical observations, patient history, and epidemiological information. The expected  result is Negative.  Fact Sheet for Patients:  bloggercourse.com  Fact Sheet for Healthcare Providers:  seriousbroker.it  This test is no t yet approved or cleared by the [place]  FDA and  has been authorized for detection and/or diagnosis of SARS-CoV-2 by FDA under an Emergency Use Authorization (EUA). This EUA will remain  in effect (meaning this test can be used) for the duration of the COVID-19 declaration under Section 564(b)(1) of the Act, 21 U.S.C.section 360bbb-3(b)(1), unless the authorization is terminated  or revoked sooner.       Influenza A by PCR NEGATIVE NEGATIVE Final   Influenza B by PCR NEGATIVE NEGATIVE Final    Comment: (NOTE) The Xpert Xpress SARS-CoV-2/FLU/RSV plus assay is intended as an aid in the diagnosis of influenza from Nasopharyngeal swab specimens and should not be used as a sole basis for treatment. Nasal washings and aspirates are unacceptable for Xpert Xpress SARS-CoV-2/FLU/RSV testing.  Fact Sheet for Patients: bloggercourse.com  Fact Sheet for Healthcare Providers: seriousbroker.it  This test is not yet approved or cleared by the [place]  FDA and has been authorized for detection and/or diagnosis of SARS-CoV-2 by FDA under an Emergency Use Authorization (EUA). This EUA will remain in effect (meaning this test can be used) for the duration of the COVID-19 declaration under Section 564(b)(1) of the Act, 21 U.S.C. section 360bbb-3(b)(1), unless the authorization is terminated or revoked.     Resp Syncytial Virus by PCR NEGATIVE NEGATIVE Final    Comment: (NOTE) Fact Sheet for Patients: bloggercourse.com  Fact Sheet for Healthcare Providers: seriousbroker.it  This test is not yet approved or cleared by the [place]  FDA and has been authorized for detection and/or diagnosis of  SARS-CoV-2 by FDA under an Emergency Use Authorization (EUA). This EUA will remain in effect (meaning this test can be used) for the duration of the COVID-19 declaration under Section 564(b)(1) of the Act, 21 U.S.C. section 360bbb-3(b)(1), unless the authorization is terminated or revoked.  Performed at [facility], [address] [place], [place] [address]   Blood culture (routine x 2)     Status: None (Preliminary result)   Collection Time: 04/30/24 11:37 PM   Specimen: BLOOD  Result Value Ref Range Status   Specimen Description BLOOD BLOOD RIGHT FOREARM  Final   Special Requests   Final    BOTTLES DRAWN AEROBIC AND ANAEROBIC Blood Culture adequate volume   Culture   Final    NO GROWTH < 12 HOURS Performed at [facility], [address] [place], [place] [address]    Report Status PENDING  Incomplete  Blood culture (routine x 2)     Status: None (Preliminary result)   Collection Time: 04/30/24 11:42 PM   Specimen: BLOOD  Result Value  Ref Range Status   Specimen Description BLOOD BLOOD LEFT FOREARM  Final   Special Requests   Final    BOTTLES DRAWN AEROBIC AND ANAEROBIC Blood Culture adequate volume   Culture   Final    NO GROWTH < 12 HOURS Performed at [facility], [address] [place], [place] [address]    Report Status PENDING  Incomplete         Radiology Studies: DG Chest Portable 1 View Result Date: 05/01/2024 EXAM: 1 VIEW(S) XRAY OF THE CHEST 05/01/2024 12:04:07 AM COMPARISON: Comparison with 02/13/2024. CLINICAL HISTORY: Respiratory distress. FINDINGS: LUNGS AND PLEURA: New airspace infiltration in the left lung base likely representing pneumonia. Aspiration would also be a possibility in the appropriate clinical setting. Emphysematous changes are suggested in the lungs. Pulmonary vascularity is normal. No pleural effusion. No pneumothorax. HEART AND MEDIASTINUM: Heart size is normal. No acute abnormality of the mediastinal  silhouette. BONES AND SOFT TISSUES: No acute osseous abnormality. IMPRESSION: 1. New airspace infiltration in the left lung base likely representing pneumonia. Aspiration is also a possibility in the appropriate clinical setting. 2. Emphysematous changes suggested in the lungs. Electronically signed by: Elsie Gravely MD 05/01/2024 12:21 AM EST RP Workstation: [id number]        Scheduled Meds:  apixaban   5 mg Oral BID   ezetimibe   10 mg Oral Daily   guaiFENesin   600 mg Oral BID   ipratropium-albuterol   3 mL Nebulization Q6H   levothyroxine   100 mcg Oral Q0600   metoprolol  succinate  25 mg Oral Daily   pantoprazole   40 mg Oral Daily   predniSONE   40 mg Oral Q breakfast   rosuvastatin   10 mg Oral QHS   tamsulosin   0.4 mg Oral Daily   torsemide   10 mg Oral Daily   Continuous Infusions:  [START ON 05/02/2024] azithromycin      [START ON 05/02/2024] cefTRIAXone  (ROCEPHIN )  IV       LOS: 0 days     Devaughn KATHEE Ban, MD [facility]   If 7PM-7AM, please contact night-coverage [email or web address] Password [facility] 05/01/2024, 8:38 AM     "

## 2024-05-01 NOTE — Hospital Course (Signed)
 [name]

## 2024-05-01 NOTE — Assessment & Plan Note (Signed)
 Clinically euvolemic Last known EF 45 to 50%, BNP 311 Continue home GDMT of metoprolol , torsemide 

## 2024-05-01 NOTE — Assessment & Plan Note (Addendum)
 COPD exacerbation Acute on chronic respiratory failure with hypoxia and hypercapnia Patient presents in acute respiratory distress, placed on BiPAP for increased work of breathing, pCO2 on BiPAP 61 Rocephin  and azithromycin  Prednisone  40 mg daily Continue BiPAP and wean as tolerated

## 2024-05-01 NOTE — ED Notes (Signed)
 Lab called at this time to let this RN know that sputum sample could not be collected from sample sent. This RN will attempt to get another sample.

## 2024-05-01 NOTE — Assessment & Plan Note (Signed)
 Diagnosed in 2015 s/p chemoradiation Follows with oncology

## 2024-05-01 NOTE — ED Notes (Signed)
 Collected sputum sample at this time from pt and sent to lab.

## 2024-05-01 NOTE — ED Notes (Signed)
 Meal provided

## 2024-05-02 DIAGNOSIS — J441 Chronic obstructive pulmonary disease with (acute) exacerbation: Secondary | ICD-10-CM

## 2024-05-02 LAB — BLOOD GAS, ARTERIAL
Acid-Base Excess: 7.5 mmol/L — ABNORMAL HIGH (ref 0.0–2.0)
Bicarbonate: 33.2 mmol/L — ABNORMAL HIGH (ref 20.0–28.0)
O2 Content: 3 L/min
O2 Saturation: 99.4 %
Patient temperature: 37
pCO2 arterial: 50 mmHg — ABNORMAL HIGH (ref 32–48)
pH, Arterial: 7.43 (ref 7.35–7.45)
pO2, Arterial: 107 mmHg (ref 83–108)

## 2024-05-02 LAB — RESPIRATORY PANEL BY PCR

## 2024-05-02 MED ORDER — AZITHROMYCIN 250 MG PO TABS
500.0000 mg | ORAL_TABLET | Freq: Every day | ORAL | Status: DC
  Administered 2024-05-03: 500 mg via ORAL
  Filled 2024-05-02: qty 2

## 2024-05-02 MED ORDER — PREDNISONE 20 MG PO TABS
40.0000 mg | ORAL_TABLET | Freq: Every day | ORAL | Status: DC
  Administered 2024-05-02 – 2024-05-03 (×2): 40 mg via ORAL
  Filled 2024-05-02 (×2): qty 2

## 2024-05-02 NOTE — Plan of Care (Signed)
   Problem: Education: Goal: Knowledge of General Education information will improve Description: Including pain rating scale, medication(s)/side effects and non-pharmacologic comfort measures Outcome: Progressing   Problem: Health Behavior/Discharge Planning: Goal: Ability to manage health-related needs will improve Outcome: Progressing   Problem: Clinical Measurements: Goal: Will remain free from infection Outcome: Progressing

## 2024-05-02 NOTE — Progress Notes (Addendum)
 " PROGRESS NOTE    Benjamin Torres  FMW:[id number] DOB: [birth year] DOA: 04/30/2024 PCP: Cleotilde Oneil FALCON, MD  Outpatient Specialists: cardiology, pulmonology, oncology    Brief Narrative:    Benjamin Torres is a 69 y.o. 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  He received DuoNebs and Solu-Medrol  en route. In the ED, BP 183/120, febrile to 100.8, pulse 97 respiration in the mid 20s with O2 sat in the high 90s.  Due to increased work of breathing he was placed on BiPAP.VBG on BiPAP with pH 7.33 and pCO2 61 TNT 19 and proBNP 311 Other labs notable for WBC 17,000 with normal lactic acid at 1.6 and negative respiratory viral panel.  BMP unremarkable EKG showed sinus at 87 no ischemic findings Chest x-ray showed possible left lower lobe pneumonia. Patient started on Rocephin  and azithromycin    Assessment & Plan:   Principal Problem:   COPD with acute exacerbation (HCC) Active Problems:   CAP (community acquired pneumonia)   Acute on chronic respiratory failure with hypoxia and hypercapnia (HCC)   Chronic systolic CHF (congestive heart failure) (HCC)   Cancer of larynx (HCC)   CAD (coronary artery disease)   Paroxysmal atrial fibrillation (HCC)   Acquired hypothyroidism   HTN (hypertension)   Smoking history   Chronic respiratory failure with hypoxia (HCC)   History of pulmonary embolus (PE)  # COPD with acute exacerbation # CAP Pulm has described this as end-stage copd (follows w/ dr. Theotis). Here with one day cough, wheeze, and dyspnea. CXR with LLL infiltrate. Covid/flu/rsv neg. Unlikely PE as  reports compliance with home apixaban . Bnp not elevated for age and does not appear fluid overloaded. Required bipap initially, weaned off overnight. Respiratory panels neg. Sputum not acceptable for culture and today reports not producing any. Improving - continue ceftriaxone /azithromycin  - s/p methylpred yesterday, will start oral prednisone  today - continue duonebs, add pulmicort  - cont home singulair  - check abg, may qualify for outpatient bipap  # Acute on chronic hypoxic hypercarbic respiratory failure On 2 liters at baseline. Chronic hypercarbia. Follows with pulm, not on bipap as outpt - weaned off bipap today, stable on home 2 liters currently  # HFrecoveredEF # Ischemic cardiomyopathy Most recent EF 55. Does not appear decompensated. - home metop, torsemide   # CAD Med mgmt advised after most recent cath. No chest pain and mild stable trop elevation, likely demand - home crestor , apixaban   # Neuropathy - home gabapentin   # GAD - home xanax   # A-fib, paroxysmal In sinus - cont home apixaban , metop  # History PE - cont home apixaban   # History laryngeal cancer S/p chemo/rad, follows with onc - outpt f/u  # Pulm nodules Waxing and waning. On surveillance by oncology - outpt f/u  # Hypothyroid - home synthroid   # BPH - home flomax   # Debility - PT consult today  DVT prophylaxis: n/a (on therapeutic apixaban ) Code Status: full, confirmed with patient Family Communication: wife updated telephonically 1/4  Level of care: Progressive Status is: inpt   Consultants:  none  Procedures: none  Antimicrobials:  See above    Subjective: Dyspnea much improved  Objective: Vitals:   05/02/24 0238 05/02/24 0446 05/02/24 0828 05/02/24 0942  BP:  116/73  127/73  Pulse:  72  78  Resp:  18  18  Temp:  (!) 97.4 F (36.3 C)  97.7 F (36.5 C)  TempSrc:  Oral  Oral  SpO2: 97% 100% 99% 97%  Weight:      Height:        Intake/Output Summary (Last 24  hours) at 05/02/2024 1040 Last data filed at 05/02/2024 0942 Gross per 24 hour  Intake 560.48 ml  Output 1000 ml  Net -439.52 ml   Filed Weights   04/30/24 2336  Weight: 79.4 kg    Examination:  General exam: Appears calm and comfortable. Chronically ill Respiratory system: improved air entry, decreased wheeze Cardiovascular system: S1 & S2 heard, RRR.   Gastrointestinal system: Abdomen is nondistended, soft and nontender.  Central nervous system: Alert and oriented. No focal neurological deficits. Extremities: Symmetric 5 x 5 power. Skin: No rashes, lesions or ulcers Psychiatry: Judgement and insight appear normal. Mood & affect appropriate.     Data Reviewed: I have personally reviewed following labs and imaging studies  CBC: Recent Labs  Lab 04/30/24 2336  WBC 17.7*  NEUTROABS 13.1*  HGB 14.4  HCT 46.3  MCV 89.9  PLT 212   Basic Metabolic Panel: Recent Labs  Lab 04/30/24 2336  NA 143  K 4.5  CL 106  CO2 28  GLUCOSE 123*  BUN 14  CREATININE 0.76  CALCIUM  9.3   GFR: Estimated Creatinine Clearance: 86.3 mL/min (by C-G formula based on SCr of 0.76 mg/dL). Liver Function Tests: Recent Labs  Lab 04/30/24 2336  AST 19  ALT 12  ALKPHOS 59  BILITOT 0.4  PROT 6.8  ALBUMIN 4.2   No results for input(s): LIPASE, AMYLASE in the last 168 hours. No results for input(s): AMMONIA in the last 168 hours. Coagulation Profile: No results for input(s): INR, PROTIME in the last 168 hours. Cardiac Enzymes: No results for input(s): CKTOTAL, CKMB, CKMBINDEX, TROPONINI in the last 168 hours. BNP (last 3 results) Recent Labs    04/30/24 2336  PROBNP 311.0*   HbA1C: No results for input(s): HGBA1C in the last 72 hours. CBG: No results for input(s): GLUCAP in the last 168 hours. Lipid Profile: No results for input(s): CHOL, HDL, LDLCALC, TRIG, CHOLHDL, LDLDIRECT in the last 72 hours. Thyroid  Function Tests: No results for input(s):  TSH, T4TOTAL, FREET4, T3FREE, THYROIDAB in the last 72 hours. Anemia Panel: No results for input(s): VITAMINB12, FOLATE, FERRITIN, TIBC, IRON , RETICCTPCT in the last 72 hours. Urine analysis:    Component Value Date/Time   COLORURINE YELLOW (A) 02/13/2024 0556   APPEARANCEUR CLEAR (A) 02/13/2024 0556   APPEARANCEUR Clear 03/06/2017 1414   LABSPEC 1.017 02/13/2024 0556   PHURINE 6.0 02/13/2024 0556   GLUCOSEU NEGATIVE 02/13/2024 0556   HGBUR NEGATIVE 02/13/2024 0556   BILIRUBINUR NEGATIVE 02/13/2024 0556   BILIRUBINUR Negative 03/06/2017 1414   KETONESUR NEGATIVE 02/13/2024 0556   PROTEINUR NEGATIVE 02/13/2024 0556   NITRITE NEGATIVE 02/13/2024 0556   LEUKOCYTESUR NEGATIVE 02/13/2024 0556   Sepsis Labs: @LABRCNTIP (procalcitonin:4,lacticidven:4)  ) Recent Results (from the past 240 hours)  Resp panel by RT-PCR (RSV, Flu A&B, Covid) Anterior Nasal Swab     Status: None   Collection Time: 04/30/24 11:37 PM   Specimen: Anterior Nasal Swab  Result Value Ref Range Status   SARS Coronavirus 2 by RT PCR NEGATIVE NEGATIVE Final    Comment: (NOTE) SARS-CoV-2  target nucleic acids are NOT DETECTED.  The SARS-CoV-2 RNA is generally detectable in upper respiratory specimens during the acute phase of infection. The lowest concentration of SARS-CoV-2 viral copies this assay can detect is 138 copies/mL. A negative result does not preclude SARS-Cov-2 infection and should not be used as the sole basis for treatment or other patient management decisions. A negative result may occur with  improper specimen collection/handling, submission of specimen other than nasopharyngeal swab, presence of viral mutation(s) within the areas targeted by this assay, and inadequate number of viral copies(<138 copies/mL). A negative result must be combined with clinical observations, patient history, and epidemiological information. The expected result is Negative.  Fact Sheet for  Patients:  bloggercourse.com  Fact Sheet for Healthcare Providers:  seriousbroker.it  This test is no t yet approved or cleared by the [place]  FDA and  has been authorized for detection and/or diagnosis of SARS-CoV-2 by FDA under an Emergency Use Authorization (EUA). This EUA will remain  in effect (meaning this test can be used) for the duration of the COVID-19 declaration under Section 564(b)(1) of the Act, 21 U.S.C.section 360bbb-3(b)(1), unless the authorization is terminated  or revoked sooner.       Influenza A by PCR NEGATIVE NEGATIVE Final   Influenza B by PCR NEGATIVE NEGATIVE Final    Comment: (NOTE) The Xpert Xpress SARS-CoV-2/FLU/RSV plus assay is intended as an aid in the diagnosis of influenza from Nasopharyngeal swab specimens and should not be used as a sole basis for treatment. Nasal washings and aspirates are unacceptable for Xpert Xpress SARS-CoV-2/FLU/RSV testing.  Fact Sheet for Patients: bloggercourse.com  Fact Sheet for Healthcare Providers: seriousbroker.it  This test is not yet approved or cleared by the [place]  FDA and has been authorized for detection and/or diagnosis of SARS-CoV-2 by FDA under an Emergency Use Authorization (EUA). This EUA will remain in effect (meaning this test can be used) for the duration of the COVID-19 declaration under Section 564(b)(1) of the Act, 21 U.S.C. section 360bbb-3(b)(1), unless the authorization is terminated or revoked.     Resp Syncytial Virus by PCR NEGATIVE NEGATIVE Final    Comment: (NOTE) Fact Sheet for Patients: bloggercourse.com  Fact Sheet for Healthcare Providers: seriousbroker.it  This test is not yet approved or cleared by the [place]  FDA and has been authorized for detection and/or diagnosis of SARS-CoV-2 by FDA under an Emergency  Use Authorization (EUA). This EUA will remain in effect (meaning this test can be used) for the duration of the COVID-19 declaration under Section 564(b)(1) of the Act, 21 U.S.C. section 360bbb-3(b)(1), unless the authorization is terminated or revoked.  Performed at [facility], [address] [place], [place] [address]   Blood culture (routine x 2)     Status: None (Preliminary result)   Collection Time: 04/30/24 11:37 PM   Specimen: BLOOD  Result Value Ref Range Status   Specimen Description BLOOD BLOOD RIGHT FOREARM  Final   Special Requests   Final    BOTTLES DRAWN AEROBIC AND ANAEROBIC Blood Culture adequate volume   Culture   Final    NO GROWTH 1 DAY Performed at [facility], [address] [place], [place] [address]    Report Status PENDING  Incomplete  Blood culture (routine x 2)     Status: None (Preliminary result)   Collection Time: 04/30/24 11:42 PM   Specimen: BLOOD  Result Value Ref Range Status   Specimen Description BLOOD BLOOD LEFT FOREARM  Final  Special Requests   Final    BOTTLES DRAWN AEROBIC AND ANAEROBIC Blood Culture adequate volume   Culture   Final    NO GROWTH 1 DAY Performed at [facility], [address] [place], [place] [address]    Report Status PENDING  Incomplete  Respiratory (~20 pathogens) panel by PCR     Status: None   Collection Time: 05/01/24  9:52 AM   Specimen: Nasopharyngeal Swab; Respiratory  Result Value Ref Range Status   Adenovirus NOT DETECTED NOT DETECTED Final   Coronavirus 229E NOT DETECTED NOT DETECTED Final    Comment: (NOTE) The Coronavirus on the Respiratory Panel, DOES NOT test for the novel  Coronavirus (2019 nCoV)    Coronavirus HKU1 NOT DETECTED NOT DETECTED Final   Coronavirus NL63 NOT DETECTED NOT DETECTED Final   Coronavirus OC43 NOT DETECTED NOT DETECTED Final   Metapneumovirus NOT DETECTED NOT DETECTED Final   Rhinovirus / Enterovirus NOT DETECTED NOT DETECTED Final    Influenza A NOT DETECTED NOT DETECTED Final   Influenza B NOT DETECTED NOT DETECTED Final   Parainfluenza Virus 1 NOT DETECTED NOT DETECTED Final   Parainfluenza Virus 2 NOT DETECTED NOT DETECTED Final   Parainfluenza Virus 3 NOT DETECTED NOT DETECTED Final   Parainfluenza Virus 4 NOT DETECTED NOT DETECTED Final   Respiratory Syncytial Virus NOT DETECTED NOT DETECTED Final   Bordetella pertussis NOT DETECTED NOT DETECTED Final   Bordetella Parapertussis NOT DETECTED NOT DETECTED Final   Chlamydophila pneumoniae NOT DETECTED NOT DETECTED Final   Mycoplasma pneumoniae NOT DETECTED NOT DETECTED Final    Comment: Performed at [facility], [address] [place], [place] [address]  Expectorated Sputum Assessment w Gram Stain, Rflx to Resp Cult     Status: None   Collection Time: 05/01/24  9:52 AM   Specimen: Urine, Clean Catch; Sputum  Result Value Ref Range Status   Specimen Description URINE, CLEAN CATCH  Final   Special Requests NONE  Final   Sputum evaluation   Final    Sputum specimen not acceptable for testing.  Please recollect.   NOTIFIED JESSIE STALEY FOR RECOLLECT ON 05/01/24 AT 1054 QSD Performed at [facility], [address] [place], [place] [address]    Report Status 05/01/2024 FINAL  Final         Radiology Studies: DG Chest Portable 1 View Result Date: 05/01/2024 EXAM: 1 VIEW(S) XRAY OF THE CHEST 05/01/2024 12:04:07 AM COMPARISON: Comparison with 02/13/2024. CLINICAL HISTORY: Respiratory distress. FINDINGS: LUNGS AND PLEURA: New airspace infiltration in the left lung base likely representing pneumonia. Aspiration would also be a possibility in the appropriate clinical setting. Emphysematous changes are suggested in the lungs. Pulmonary vascularity is normal. No pleural effusion. No pneumothorax. HEART AND MEDIASTINUM: Heart size is normal. No acute abnormality of the mediastinal silhouette. BONES AND SOFT TISSUES: No acute osseous abnormality. IMPRESSION:  1. New airspace infiltration in the left lung base likely representing pneumonia. Aspiration is also a possibility in the appropriate clinical setting. 2. Emphysematous changes suggested in the lungs. Electronically signed by: Elsie Gravely MD 05/01/2024 12:21 AM EST RP Workstation: [id number]        Scheduled Meds:  apixaban   5 mg Oral BID   [START ON 05/03/2024] azithromycin   500 mg Oral Daily   budesonide  (PULMICORT ) nebulizer solution  0.25 mg Nebulization BID   ezetimibe   10 mg Oral Daily   gabapentin   100 mg Oral Daily   guaiFENesin   600 mg Oral  BID   ipratropium-albuterol   3 mL Nebulization TID   levothyroxine   100 mcg Oral Q0600   metoprolol  succinate  25 mg Oral Daily   montelukast   10 mg Oral Daily   mouth rinse  15 mL Mouth Rinse 4 times per day   pantoprazole   40 mg Oral Daily   rosuvastatin   10 mg Oral QHS   tamsulosin   0.4 mg Oral Daily   torsemide   10 mg Oral Daily   Continuous Infusions:  cefTRIAXone  (ROCEPHIN )  IV Stopped (05/02/24 0231)     LOS: 1 day     Devaughn KATHEE Ban, MD [facility]   If 7PM-7AM, please contact night-coverage [email or web address] Password [email or web address] 05/02/2024, 10:40 AM     "

## 2024-05-02 NOTE — Plan of Care (Signed)
" °  Problem: Clinical Measurements: Goal: Ability to maintain clinical measurements within normal limits will improve Outcome: Progressing Goal: Will remain free from infection Outcome: Progressing Goal: Diagnostic test results will improve Outcome: Progressing Goal: Respiratory complications will improve Outcome: Progressing Goal: Cardiovascular complication will be avoided Outcome: Progressing   Problem: Pain Managment: Goal: General experience of comfort will improve and/or be controlled Outcome: Progressing   Problem: Respiratory: Goal: Ability to maintain a clear airway will improve Outcome: Progressing Goal: Levels of oxygenation will improve Outcome: Progressing Goal: Ability to maintain adequate ventilation will improve Outcome: Progressing   "

## 2024-05-02 NOTE — Evaluation (Signed)
 Physical Therapy Evaluation Patient Details Name: Benjamin Torres MRN: [id number] DOB: [birth year] Today's Date: 05/02/2024  History of Present Illness  Benjamin Torres is a 69 y.o. 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 Supplemental oxygen and denies history of falls, and currently requiring supervision  for ambulation in room on 3 L/min. Supplemental O2 during activity and 2L/min. At rest. The patient is A&O x 4, presenting with good willingness to work with PT. The patient resides in a house and lives with spouse with family/friend support. There are 4 STE inside the residence.  Vitals are stable with slightly exaggerated HR response to exercise with an SpO? of >90% throughout. Gait was assessed with no assistive device pt able to manage supplemental oxygen caddy. Gait mechanic observations noted mild fatigue after ~ 40 feet of ambulation requiring ~1 min. To recover. The overall clinical impression is that the patient presents with mild mobility limitations. Recommended skilled PT will address safety, mobility, and discharge planning.        If plan is discharge home, recommend the following: A little help with walking and/or transfers;Assist for transportation;Help with stairs or ramp for entrance   Can travel by private vehicle        Equipment Recommendations Rolling walker (2 wheels)  Recommendations for Other Services       Functional Status Assessment Patient has had a recent decline in  their functional status and demonstrates the ability to make significant improvements in function in a reasonable and predictable amount of time.     Precautions / Restrictions Restrictions Weight Bearing Restrictions Per Provider Order: No      Mobility  Bed Mobility Overal bed mobility: Modified Independent                  Transfers Overall transfer level: Modified independent                      Ambulation/Gait Ambulation/Gait assistance: Contact guard assist, Supervision Gait Distance (Feet): 50 Feet Assistive device: 1 person hand held assist Gait Pattern/deviations: Step-through pattern, Trunk flexed Gait velocity: decreased     General Gait Details: after 40 feet of ambulation pt. required standing rest break for ~1 min. to recover; Pt HR responsed normal to activity and spO2 >90 on 3L/min.; pt >90 spO2 on 2L/min. at baseline  Careers Information Officer     Tilt Bed    Modified Rankin (Stroke Patients Only)       Balance Overall balance assessment: Modified Independent                                           Pertinent Vitals/Pain Pain Assessment Pain Assessment: No/denies pain    Home Living Family/patient expects to be discharged to:: Private residence Living Arrangements: Spouse/significant other Available Help at Discharge: Family Type of Home: House Home Access:  Stairs to enter Entrance Stairs-Rails: None Entrance Stairs-Number of Steps: 3   Home Layout: One level Home Equipment: Agricultural Consultant (2 wheels);Cane - single point;Wheelchair - power      Prior Function Prior Level of Function : Independent/Modified Independent             Mobility Comments: independent; no falls ADLs Comments: no help with bathing and dressing     Extremity/Trunk Assessment   Upper Extremity Assessment Upper Extremity Assessment: Overall WFL for tasks assessed    Lower Extremity Assessment Lower  Extremity Assessment: Generalized weakness    Cervical / Trunk Assessment Cervical / Trunk Assessment: Normal  Communication   Communication Communication: No apparent difficulties    Cognition Arousal: Alert Behavior During Therapy: WFL for tasks assessed/performed   PT - Cognitive impairments: No apparent impairments                         Following commands: Intact       Cueing Cueing Techniques: Verbal cues     General Comments      Exercises     Assessment/Plan    PT Assessment Patient needs continued PT services  PT Problem List Decreased activity tolerance       PT Treatment Interventions Functional mobility training;Therapeutic activities;Therapeutic exercise;Patient/family education    PT Goals (Current goals can be found in the Care Plan section)  Acute Rehab PT Goals Patient Stated Goal: Pt wants to return home PT Goal Formulation: With patient Time For Goal Achievement: 05/16/24 Potential to Achieve Goals: Good    Frequency Min 2X/week     Co-evaluation               AM-PAC PT 6 Clicks Mobility  Outcome Measure Help needed turning from your back to your side while in a flat bed without using bedrails?: None Help needed moving from lying on your back to sitting on the side of a flat bed without using bedrails?: None Help needed moving to and from a bed to a chair (including a wheelchair)?: None Help needed standing up from a chair using your arms (e.g., wheelchair or bedside chair)?: None Help needed to walk in hospital room?: A Little Help needed climbing 3-5 steps with a railing? : A Little 6 Click Score: 22    End of Session   Activity Tolerance: Patient tolerated treatment well;Patient limited by fatigue Patient left: in bed;with call bell/phone within reach;with family/visitor present Nurse Communication: Mobility status PT Visit Diagnosis: Difficulty in walking, not elsewhere classified (R26.2)    Time:  8964-8941 PT Time Calculation (min) (ACUTE ONLY): 23 min   Charges:   PT Evaluation $PT Eval Low Complexity: 1 Low   PT General Charges $$ ACUTE PT VISIT: 1 Visit         Sherlean Lesches DPT, PT    Garold Sheeler A Xaviera Flaten 05/02/2024, 11:05 AM

## 2024-05-03 ENCOUNTER — Other Ambulatory Visit: Payer: Self-pay

## 2024-05-03 DIAGNOSIS — J441 Chronic obstructive pulmonary disease with (acute) exacerbation: Secondary | ICD-10-CM | POA: Diagnosis not present

## 2024-05-03 LAB — BASIC METABOLIC PANEL WITH GFR
Anion gap: 8 (ref 5–15)
BUN: 30 mg/dL — ABNORMAL HIGH (ref 8–23)
CO2: 31 mmol/L (ref 22–32)
Calcium: 9.1 mg/dL (ref 8.9–10.3)
Chloride: 104 mmol/L (ref 98–111)
Creatinine, Ser: 0.71 mg/dL (ref 0.61–1.24)
GFR, Estimated: >60 mL/min
Glucose, Bld: 154 mg/dL — ABNORMAL HIGH (ref 70–99)
Potassium: 3.7 mmol/L (ref 3.5–5.1)
Sodium: 142 mmol/L (ref 135–145)

## 2024-05-03 MED ORDER — PREDNISONE 20 MG PO TABS
40.0000 mg | ORAL_TABLET | Freq: Every day | ORAL | 0 refills | Status: AC
  Filled 2024-05-03: qty 6, 3d supply, fill #0

## 2024-05-03 MED ORDER — AZITHROMYCIN 250 MG PO TABS
500.0000 mg | ORAL_TABLET | Freq: Once | ORAL | 0 refills | Status: AC
  Filled 2024-05-03: qty 2, 1d supply, fill #0

## 2024-05-03 MED ORDER — ALPRAZOLAM 0.5 MG PO TABS: 0.5000 mg | ORAL_TABLET | Freq: Three times a day (TID) | ORAL | Status: DC | PRN

## 2024-05-03 MED ORDER — AMOXICILLIN-POT CLAVULANATE 875-125 MG PO TABS
1.0000 | ORAL_TABLET | Freq: Two times a day (BID) | ORAL | 0 refills | Status: AC
  Filled 2024-05-03: qty 6, 3d supply, fill #0

## 2024-05-03 NOTE — TOC Initial Note (Signed)
 Transition of Care [facility]) - Initial/Assessment Note    Patient Details  Name: Benjamin Torres MRN: [id number] Date of Birth: 10-Nov-1955  Transition of Care [facility]) CM/SW Contact:    Shasta DELENA Daring, RN Phone Number: 05/03/2024, 11:40 AM  Clinical Narrative:                 Readmission risk assessment complete Patient lives in single family home with wife and sister in law Uses the pharmacy on [address] for acute needs.  Adapt for home Oxygen.  Wife is bringing O2 to pick up  Has walker, electric wheelchair, cane at home  Accepted [facility] via [facility]  Notified and linked in hub.  Patient drives self to appointments.  RNCM signing off=  Expected Discharge Plan: Home w Home Health Services Barriers to Discharge: Barriers Resolved   Patient Goals and CMS Choice            Expected Discharge Plan and Services In-house Referral: Clinical Social Work Discharge Planning Services: CM Consult   Living arrangements for the past 2 months: Single Family Home Expected Discharge Date: 05/03/24                                    Prior Living Arrangements/Services Living arrangements for the past 2 months: Single Family Home Lives with:: Relatives, Spouse Patient language and need for interpreter reviewed:: Yes        Need for Family Participation in Patient Care: Yes (Comment) Care giver support system in place?: Yes (comment)   Criminal Activity/Legal Involvement Pertinent to Current Situation/Hospitalization: No - Comment as needed  Activities of Daily Living   ADL Screening (condition at time of admission) Independently performs ADLs?: Yes (appropriate for developmental age) Is the patient deaf or have difficulty hearing?: No Does the patient have difficulty seeing, even when wearing glasses/contacts?: No Does the patient have difficulty concentrating, remembering, or making decisions?: No  Permission Sought/Granted                  Emotional Assessment Appearance::  Appears stated age Attitude/Demeanor/Rapport: Gracious, Engaged Affect (typically observed): Appropriate Orientation: : Oriented to Situation, Oriented to  Time, Oriented to Place, Oriented to Self Alcohol / Substance Use: Not Applicable Psych Involvement: No (comment)  Admission diagnosis:  Bronchospasm [J98.01] COPD with acute exacerbation (HCC) [J44.1] Acute on chronic respiratory failure, unspecified whether with hypoxia or hypercapnia (HCC) [J96.20] Pneumonia due to infectious organism, unspecified laterality, unspecified part of lung [J18.9] Patient Active Problem List   Diagnosis Date Noted   Acute on chronic respiratory failure with hypoxia and hypercapnia (HCC) 05/01/2024   History of pulmonary embolus (PE) 05/01/2024   Iron  deficiency anemia 03/04/2024   Chronic respiratory failure with hypoxia (HCC) 08/01/2023   Persistent atrial fibrillation (HCC) 05/14/2023   Paroxysmal atrial fibrillation (HCC) 05/13/2023   COPD with acute exacerbation (HCC) 05/11/2023   COPD exacerbation (HCC) 05/10/2023   HTN (hypertension) 05/10/2023   Chronic systolic CHF (congestive heart failure) (HCC) 05/10/2023   Myocardial injury 05/10/2023   Influenza A 05/10/2023   CAD (coronary artery disease) 06/09/2022   Hyperlipidemia 06/09/2022   COPD (chronic obstructive pulmonary disease) (HCC) 06/09/2022   Tobacco use 02/20/2022   Lung nodule 11/20/2021   Pneumonia 08/10/2020   CAP (community acquired pneumonia) 08/09/2020   B12 deficiency 07/31/2020   Smoking history 11/09/2019   Tubular adenoma 08/17/2018   Unintentional weight loss  Benign neoplasm of descending colon    Polyp of sigmoid colon    Gastritis without bleeding    Duodenitis    Weight loss 01/24/2017   Acquired hypothyroidism 06/22/2016   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 - [place], [place] - [address] [place] [address] Phone: [phone] Fax: [phone]  [id number] [place], [place] - [address] AT [facility] OF [address] [place] [address] Phone: [phone] Fax: [phone]  [facility] [address] [place] [address] Phone: [phone] Fax: [phone]     Social Drivers of Health (SDOH) Social History: SDOH Screenings   Food Insecurity: No Food Insecurity (05/01/2024)  Housing: Low Risk (05/01/2024)  Transportation Needs: No Transportation Needs (05/01/2024)  Utilities: Not At Risk (05/01/2024)  Depression (PHQ2-9): Low Risk (03/16/2024)  Financial Resource Strain: Low Risk  (08/19/2023)   Received from [facility]  Social Connections: Socially Integrated (05/01/2024)  Tobacco Use: Medium Risk (05/01/2024)   SDOH Interventions:     Readmission Risk Interventions    05/03/2024   11:34 AM  Readmission Risk Prevention Plan  Transportation Screening Complete  PCP or Specialist Appt within 3-5 Days --  HRI or Home Care Consult Complete  Social Work Consult for Recovery Care Planning/Counseling Complete  Palliative Care Screening Not Applicable  Medication Review Oceanographer) Complete

## 2024-05-03 NOTE — Plan of Care (Signed)

## 2024-05-03 NOTE — Discharge Summary (Signed)
 Benjamin Torres FMW:[id number] DOB: Jun 22, 1955 DOA: 04/30/2024  PCP: Benjamin Oneil FALCON, MD  Admit date: 04/30/2024 Discharge date: 05/03/2024  Time spent: 35 minutes  Recommendations for Outpatient Follow-up:  Pcp and pulmonology f/u     Discharge Diagnoses:  Principal Problem:   COPD with acute exacerbation (HCC) Active Problems:   CAP (community acquired pneumonia)   Acute on chronic respiratory failure with hypoxia and hypercapnia (HCC)   Chronic systolic CHF (congestive heart failure) (HCC)   Cancer of larynx (HCC)   CAD (coronary artery disease)   Paroxysmal atrial fibrillation (HCC)   Acquired hypothyroidism   HTN (hypertension)   Smoking history   Chronic respiratory failure with hypoxia (HCC)   History of pulmonary embolus (PE)   Discharge Condition: improving  Diet recommendation: heart healthy  Filed Weights   04/30/24 2336  Weight: 79.4 kg    History of present illness:  From admission h and p Benjamin Torres is a 69 y.o. 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  He received DuoNebs and Solu-Medrol  en route.   Hospital Course:   # COPD with acute exacerbation # CAP Pulm has described this as end-stage copd (follows w/ dr. Theotis). Here with one day cough, wheeze, and dyspnea. CXR with LLL infiltrate. Covid/flu/rsv neg. Unlikely PE as reports compliance with home apixaban . Bnp not elevated for age and does not appear fluid overloaded. Required bipap initially, weaned off first night of admission. Respiratory panels neg. Sputum not acceptable for culture and now reports not  producing any. Improving - treated for CAP with ceftriaxone /azithromycin , will complete a course of oral abx for CAP - prednisone  to complete a burst of therapy - home inhalers   # Acute on chronic hypoxic hypercarbic respiratory failure On 2 liters at baseline. Chronic hypercarbia. Follows with pulm, not on bipap as outpt. Dyspnea and hypoxia much improved now satting 95-100% on 3 liters - abg co2 50 so doesn't appear to qualify for home bipap at this stage, encouraged to f/u with pulm - continue home Nogal o2   # HFrecoveredEF # Ischemic cardiomyopathy Most recent EF 55. Does not appear decompensated. - home metop, torsemide    # CAD Med mgmt advised after most recent cath. No chest pain and mild stable trop elevation, likely demand - home crestor , apixaban    # Neuropathy - home gabapentin    # GAD - home xanax    # A-fib, paroxysmal In sinus - cont home apixaban , metop   # History PE - cont home apixaban    # History laryngeal cancer S/p chemo/rad, follows with onc - outpt f/u   # Pulm nodules Waxing and waning. On surveillance by oncology - outpt f/u   # Hypothyroid - home synthroid    # BPH - home flomax    # Debility - PT advising home health, ordered. Has recommended walker.  Procedures: none   Consultations: none  Discharge Exam: Vitals:   05/03/24 0755 05/03/24 0829  BP:  (!) 127/96  Pulse:  95  Resp:  16  Temp:  97.8 F (36.6 C)  SpO2: 98% 95%    General: NAD Cardiovascular: RRR Respiratory: faint exp wheeze, improved  aeration, no tachypnea  Discharge Instructions   Discharge Instructions     Increase activity slowly   Complete by: As directed       Allergies as of 05/03/2024   No Known Allergies      Medication List     TAKE these medications    acetaminophen  325 MG tablet Commonly known as: TYLENOL  Take 2 tablets (650 mg total) by mouth every 6 (six) hours as needed for mild pain (pain score 1-3).   albuterol  108 (90 Base)  MCG/ACT inhaler Commonly known as: VENTOLIN  HFA Inhale 1 puff into the lungs every 4 (four) hours as needed for wheezing or shortness of breath.   albuterol  (2.5 MG/3ML) 0.083% nebulizer solution Commonly known as: PROVENTIL  Take 3 mLs (2.5 mg total) by nebulization every 6 (six) hours as needed for wheezing or shortness of breath.   ALPRAZolam  0.5 MG tablet Commonly known as: XANAX  Take 0.5 mg by mouth 2 (two) times daily.   amoxicillin -clavulanate 875-125 MG tablet Commonly known as: AUGMENTIN  Take 1 tablet by mouth 2 (two) times daily for 3 days.   apixaban  5 MG Tabs tablet Commonly known as: ELIQUIS  Take 1 tablet (5 mg total) by mouth 2 (two) times daily.   azithromycin  250 MG tablet Commonly known as: ZITHROMAX  Take 2 tablets (500 mg total) by mouth once for 1 dose. Take on 05/04/24   Breztri  Aerosphere 160-9-4.8 MCG/ACT Aero inhaler Generic drug: budesonide -glycopyrrolate -formoterol  Inhale 2 puffs into the lungs in the morning and at bedtime.   cyanocobalamin  1000 MCG/ML injection Commonly known as: VITAMIN B12 Inject 1,000 mcg into the muscle every 30 (thirty) days.   ezetimibe  10 MG tablet Commonly known as: ZETIA  Take 1 tablet (10 mg total) by mouth daily.   fluconazole 200 MG tablet Commonly known as: DIFLUCAN Take 200 mg by mouth daily.   gabapentin  100 MG capsule Commonly known as: NEURONTIN  Take 100 mg by mouth daily.   levothyroxine  100 MCG tablet Commonly known as: SYNTHROID  Take 100 mcg by mouth daily before breakfast.   metoprolol  succinate 50 MG 24 hr tablet Commonly known as: TOPROL -XL Take 0.5 tablets (25 mg total) by mouth daily.   montelukast  10 MG tablet Commonly known as: SINGULAIR  Take 10 mg by mouth daily.   nicotine  21 mg/24hr patch Commonly known as: NICODERM CQ  - dosed in mg/24 hours Place 1 patch (21 mg total) onto the skin daily.   OXYGEN Inhale into the lungs. At night and with exertion   pantoprazole  40 MG tablet Commonly  known as: PROTONIX  Take 40 mg by mouth daily.   predniSONE  20 MG tablet Commonly known as: DELTASONE  Take 2 tablets (40 mg total) by mouth daily with breakfast for 3 days.   rosuvastatin  10 MG tablet Commonly known as: CRESTOR  Take 10 mg by mouth at bedtime.   tamsulosin  0.4 MG Caps capsule Commonly known as: FLOMAX  Take 1 capsule (0.4 mg total) by mouth daily.   torsemide  10 MG tablet Commonly known as: DEMADEX  Take 10 mg by mouth.       Allergies[1]  Follow-up Information     Benjamin Oneil FALCON, MD Follow up.   Specialty: Internal Medicine Why: 1 week Contact information: [address] [facility] [place] [address] [phone]         Benjamin Torres Lavelle BRAVO, MD Follow up.   Specialty: Specialist Contact information: [address] [place] [address] [phone]  The results of significant diagnostics from this hospitalization (including imaging, microbiology, ancillary and laboratory) are listed below for reference.    Significant Diagnostic Studies: DG Chest Portable 1 View Result Date: 05/01/2024 EXAM: 1 VIEW(S) XRAY OF THE CHEST 05/01/2024 12:04:07 AM COMPARISON: Comparison with 02/13/2024. CLINICAL HISTORY: Respiratory distress. FINDINGS: LUNGS AND PLEURA: New airspace infiltration in the left lung base likely representing pneumonia. Aspiration would also be a possibility in the appropriate clinical setting. Emphysematous changes are suggested in the lungs. Pulmonary vascularity is normal. No pleural effusion. No pneumothorax. HEART AND MEDIASTINUM: Heart size is normal. No acute abnormality of the mediastinal silhouette. BONES AND SOFT TISSUES: No acute osseous abnormality. IMPRESSION: 1. New airspace infiltration in the left lung base likely representing pneumonia. Aspiration is also a possibility in the appropriate clinical setting. 2. Emphysematous changes suggested in the lungs. Electronically  signed by: Elsie Gravely MD 05/01/2024 12:21 AM EST RP Workstation: [id number]    Microbiology: Recent Results (from the past 240 hours)  Resp panel by RT-PCR (RSV, Flu A&B, Covid) Anterior Nasal Swab     Status: None   Collection Time: 04/30/24 11:37 PM   Specimen: Anterior Nasal Swab  Result Value Ref Range Status   SARS Coronavirus 2 by RT PCR NEGATIVE NEGATIVE Final    Comment: (NOTE) SARS-CoV-2 target nucleic acids are NOT DETECTED.  The SARS-CoV-2 RNA is generally detectable in upper respiratory specimens during the acute phase of infection. The lowest concentration of SARS-CoV-2 viral copies this assay can detect is 138 copies/mL. A negative result does not preclude SARS-Cov-2 infection and should not be used as the sole basis for treatment or other patient management decisions. A negative result may occur with  improper specimen collection/handling, submission of specimen other than nasopharyngeal swab, presence of viral mutation(s) within the areas targeted by this assay, and inadequate number of viral copies(<138 copies/mL). A negative result must be combined with clinical observations, patient history, and epidemiological information. The expected result is Negative.  Fact Sheet for Patients:  bloggercourse.com  Fact Sheet for Healthcare Providers:  seriousbroker.it  This test is no t yet approved or cleared by the [place]  FDA and  has been authorized for detection and/or diagnosis of SARS-CoV-2 by FDA under an Emergency Use Authorization (EUA). This EUA will remain  in effect (meaning this test can be used) for the duration of the COVID-19 declaration under Section 564(b)(1) of the Act, 21 U.S.C.section 360bbb-3(b)(1), unless the authorization is terminated  or revoked sooner.       Influenza A by PCR NEGATIVE NEGATIVE Final   Influenza B by PCR NEGATIVE NEGATIVE Final    Comment: (NOTE) The Xpert Xpress  SARS-CoV-2/FLU/RSV plus assay is intended as an aid in the diagnosis of influenza from Nasopharyngeal swab specimens and should not be used as a sole basis for treatment. Nasal washings and aspirates are unacceptable for Xpert Xpress SARS-CoV-2/FLU/RSV testing.  Fact Sheet for Patients: bloggercourse.com  Fact Sheet for Healthcare Providers: seriousbroker.it  This test is not yet approved or cleared by the [place]  FDA and has been authorized for detection and/or diagnosis of SARS-CoV-2 by FDA under an Emergency Use Authorization (EUA). This EUA will remain in effect (meaning this test can be used) for the duration of the COVID-19 declaration under Section 564(b)(1) of the Act, 21 U.S.C. section 360bbb-3(b)(1), unless the authorization is terminated or revoked.     Resp Syncytial Virus by PCR NEGATIVE NEGATIVE Final    Comment: (NOTE) Fact Sheet for Patients: bloggercourse.com  Fact Sheet for Healthcare Providers: seriousbroker.it  This test is not yet approved or cleared by the [place]  FDA and has been authorized for detection and/or diagnosis of SARS-CoV-2 by FDA under an Emergency Use Authorization (EUA). This EUA will remain in effect (meaning this test can be used) for the duration of the COVID-19 declaration under Section 564(b)(1) of the Act, 21 U.S.C. section 360bbb-3(b)(1), unless the authorization is terminated or revoked.  Performed at [facility], [address] [place], [place] [address]   Blood culture (routine x 2)     Status: None (Preliminary result)   Collection Time: 04/30/24 11:37 PM   Specimen: BLOOD  Result Value Ref Range Status   Specimen Description BLOOD BLOOD RIGHT FOREARM  Final   Special Requests   Final    BOTTLES DRAWN AEROBIC AND ANAEROBIC Blood Culture adequate volume   Culture   Final    NO GROWTH 2 DAYS Performed  at [facility], [address], [place], [place] [address]    Report Status PENDING  Incomplete  Blood culture (routine x 2)     Status: None (Preliminary result)   Collection Time: 04/30/24 11:42 PM   Specimen: BLOOD  Result Value Ref Range Status   Specimen Description BLOOD BLOOD LEFT FOREARM  Final   Special Requests   Final    BOTTLES DRAWN AEROBIC AND ANAEROBIC Blood Culture adequate volume   Culture   Final    NO GROWTH 2 DAYS Performed at [facility], [address] [place], [place] [address]    Report Status PENDING  Incomplete  Respiratory (~20 pathogens) panel by PCR     Status: None   Collection Time: 05/01/24  9:52 AM   Specimen: Nasopharyngeal Swab; Respiratory  Result Value Ref Range Status   Adenovirus NOT DETECTED NOT DETECTED Final   Coronavirus 229E NOT DETECTED NOT DETECTED Final    Comment: (NOTE) The Coronavirus on the Respiratory Panel, DOES NOT test for the novel  Coronavirus (2019 nCoV)    Coronavirus HKU1 NOT DETECTED NOT DETECTED Final   Coronavirus NL63 NOT DETECTED NOT DETECTED Final   Coronavirus OC43 NOT DETECTED NOT DETECTED Final   Metapneumovirus NOT DETECTED NOT DETECTED Final   Rhinovirus / Enterovirus NOT DETECTED NOT DETECTED Final   Influenza A NOT DETECTED NOT DETECTED Final   Influenza B NOT DETECTED NOT DETECTED Final   Parainfluenza Virus 1 NOT DETECTED NOT DETECTED Final   Parainfluenza Virus 2 NOT DETECTED NOT DETECTED Final   Parainfluenza Virus 3 NOT DETECTED NOT DETECTED Final   Parainfluenza Virus 4 NOT DETECTED NOT DETECTED Final   Respiratory Syncytial Virus NOT DETECTED NOT DETECTED Final   Bordetella pertussis NOT DETECTED NOT DETECTED Final   Bordetella Parapertussis NOT DETECTED NOT DETECTED Final   Chlamydophila pneumoniae NOT DETECTED NOT DETECTED Final   Mycoplasma pneumoniae NOT DETECTED NOT DETECTED Final    Comment: Performed at [facility], [address], [place], [place] [address]   Expectorated Sputum Assessment w Gram Stain, Rflx to Resp Cult     Status: None   Collection Time: 05/01/24  9:52 AM   Specimen: Urine, Clean Catch; Sputum  Result Value Ref Range Status   Specimen Description URINE, CLEAN CATCH  Final   Special Requests NONE  Final   Sputum evaluation   Final    Sputum specimen not acceptable for testing.  Please recollect.   NOTIFIED JESSIE STALEY FOR RECOLLECT ON 05/01/24 AT 1054 QSD Performed  at [facility], [address] [place], [place] [address]    Report Status 05/01/2024 FINAL  Final     Labs: Basic Metabolic Panel: Recent Labs  Lab 04/30/24 2336 05/03/24 0441  NA 143 142  K 4.5 3.7  CL 106 104  CO2 28 31  GLUCOSE 123* 154*  BUN 14 30*  CREATININE 0.76 0.71  CALCIUM  9.3 9.1   Liver Function Tests: Recent Labs  Lab 04/30/24 2336  AST 19  ALT 12  ALKPHOS 59  BILITOT 0.4  PROT 6.8  ALBUMIN 4.2   No results for input(s): LIPASE, AMYLASE in the last 168 hours. No results for input(s): AMMONIA in the last 168 hours. CBC: Recent Labs  Lab 04/30/24 2336  WBC 17.7*  NEUTROABS 13.1*  HGB 14.4  HCT 46.3  MCV 89.9  PLT 212   Cardiac Enzymes: No results for input(s): CKTOTAL, CKMB, CKMBINDEX, TROPONINI in the last 168 hours. BNP: BNP (last 3 results) Recent Labs    05/10/23 2335 02/13/24 0614  BNP 137.8* 69.3    ProBNP (last 3 results) Recent Labs    04/30/24 2336  PROBNP 311.0*    CBG: No results for input(s): GLUCAP in the last 168 hours.     Signed:  Devaughn KATHEE Ban MD.  [facility] 05/03/2024, 10:50 AM     [1] No Known Allergies

## 2024-05-03 NOTE — Progress Notes (Signed)
 AVS teaching provided to patient. All questions/concerns answered. Patient instructed to call to make follow up appointments and he voiced understanding. No further needs identified at this time. PIV and telemetry removed. Patient calling his wife ride home.

## 2024-05-03 NOTE — TOC Transition Note (Signed)
 Transition of Care [facility]) - Discharge Note   Patient Details  Name: Benjamin Torres MRN: [id number] Date of Birth: 11/23/1955  [facility]) CM/SW Contact:  Shasta DELENA Daring, RN Phone Number: 05/03/2024, 12:10 PM   Clinical Narrative:    Patient has discharge orders. Wife aware of discharge. Patient selected HH services with [facility]. Agency notified.   No additional TOC needs.   RNCM signing off.   Final next level of care: Home w Home Health Services Barriers to Discharge: Barriers Resolved   Patient Goals and CMS Choice     Choice offered to / list presented to : Patient      Discharge Placement                    Patient and family notified of of transfer: 05/03/24  Discharge Plan and Services Additional resources added to the After Visit Summary for   In-house Referral: Clinical Social Work Discharge Planning Services: CM Consult                      HH Arranged: PT HH Agency: [facility] Date HH Agency Contacted: 05/03/24 Time HH Agency Contacted: 1145    Social Drivers of Health (SDOH) Interventions SDOH Screenings   Food Insecurity: No Food Insecurity (05/01/2024)  Housing: Low Risk (05/01/2024)  Transportation Needs: No Transportation Needs (05/01/2024)  Utilities: Not At Risk (05/01/2024)  Depression (PHQ2-9): Low Risk (03/16/2024)  Financial Resource Strain: Low Risk  (08/19/2023)   [facility] from [facility]  Social Connections: Socially Integrated (05/01/2024)  Tobacco Use: Medium Risk (05/01/2024)     Readmission Risk Interventions    05/03/2024   11:34 AM  Readmission Risk Prevention Plan  Transportation Screening Complete  PCP or Specialist Appt within 3-5 Days --  HRI or Home Care Consult Complete  Social Work Consult for Recovery Care Planning/Counseling Complete  Palliative Care Screening Not Applicable  Medication Review Oceanographer) Complete

## 2024-05-10 LAB — CULTURE, BLOOD (ROUTINE X 2)
Culture: NO GROWTH
Culture: NO GROWTH
Special Requests: ADEQUATE
Special Requests: ADEQUATE

## 2024-06-09 ENCOUNTER — Inpatient Hospital Stay

## 2024-06-16 ENCOUNTER — Inpatient Hospital Stay

## 2024-06-16 ENCOUNTER — Inpatient Hospital Stay: Admitting: Oncology

## 2024-07-20 ENCOUNTER — Other Ambulatory Visit

## 2024-08-03 ENCOUNTER — Other Ambulatory Visit

## 2024-08-03 ENCOUNTER — Ambulatory Visit: Admitting: Oncology
